# Patient Record
Sex: Female | Born: 1975 | Hispanic: No | Marital: Single | Smoking: Former smoker
Health system: Southern US, Community
[De-identification: ages and names within clinical notes are randomized; demographics above are authoritative.]

## PROBLEM LIST (undated history)

## (undated) DIAGNOSIS — Z8521 Personal history of malignant neoplasm of larynx: Secondary | ICD-10-CM

## (undated) DIAGNOSIS — D649 Anemia, unspecified: Secondary | ICD-10-CM

## (undated) DIAGNOSIS — R519 Headache, unspecified: Secondary | ICD-10-CM

## (undated) DIAGNOSIS — I10 Essential (primary) hypertension: Secondary | ICD-10-CM

## (undated) DIAGNOSIS — R0781 Pleurodynia: Secondary | ICD-10-CM

## (undated) DIAGNOSIS — C140 Malignant neoplasm of pharynx, unspecified: Secondary | ICD-10-CM

## (undated) DIAGNOSIS — E039 Hypothyroidism, unspecified: Secondary | ICD-10-CM

## (undated) DIAGNOSIS — J189 Pneumonia, unspecified organism: Secondary | ICD-10-CM

## (undated) DIAGNOSIS — R918 Other nonspecific abnormal finding of lung field: Secondary | ICD-10-CM

## (undated) DIAGNOSIS — R51 Headache: Secondary | ICD-10-CM

## (undated) DIAGNOSIS — Z9189 Other specified personal risk factors, not elsewhere classified: Secondary | ICD-10-CM

## (undated) HISTORY — DX: Other nonspecific abnormal finding of lung field: R91.8

## (undated) HISTORY — DX: Hypothyroidism, unspecified: E03.9

## (undated) HISTORY — DX: Pleurodynia: R07.81

## (undated) HISTORY — PX: TUBAL LIGATION: SHX77

## (undated) HISTORY — PX: TRACHEOSTOMY: SUR1362

## (undated) HISTORY — DX: Other specified personal risk factors, not elsewhere classified: Z91.89

## (undated) HISTORY — PX: ABDOMINAL SURGERY: SHX537

## (undated) HISTORY — DX: Anemia, unspecified: D64.9

## (undated) HISTORY — DX: Pneumonia, unspecified organism: J18.9

---

## 2012-08-08 ENCOUNTER — Emergency Department (HOSPITAL_COMMUNITY)
Admission: EM | Admit: 2012-08-08 | Discharge: 2012-08-08 | Disposition: A | Discharge status: Home / Self Care | Payer: Medicaid Other | Attending: Emergency Medicine | Admitting: Emergency Medicine

## 2012-08-08 ENCOUNTER — Encounter (HOSPITAL_COMMUNITY): Payer: Self-pay | Admitting: *Deleted

## 2012-08-08 ENCOUNTER — Emergency Department (HOSPITAL_COMMUNITY): Payer: Medicaid Other

## 2012-08-08 DIAGNOSIS — Z87891 Personal history of nicotine dependence: Secondary | ICD-10-CM | POA: Insufficient documentation

## 2012-08-08 DIAGNOSIS — I10 Essential (primary) hypertension: Secondary | ICD-10-CM | POA: Insufficient documentation

## 2012-08-08 DIAGNOSIS — Z85819 Personal history of malignant neoplasm of unspecified site of lip, oral cavity, and pharynx: Secondary | ICD-10-CM | POA: Insufficient documentation

## 2012-08-08 DIAGNOSIS — Z79899 Other long term (current) drug therapy: Secondary | ICD-10-CM | POA: Insufficient documentation

## 2012-08-08 DIAGNOSIS — T85598A Other mechanical complication of other gastrointestinal prosthetic devices, implants and grafts, initial encounter: Secondary | ICD-10-CM

## 2012-08-08 DIAGNOSIS — K9423 Gastrostomy malfunction: Secondary | ICD-10-CM | POA: Insufficient documentation

## 2012-08-08 HISTORY — DX: Essential (primary) hypertension: I10

## 2012-08-08 HISTORY — DX: Malignant neoplasm of pharynx, unspecified: C14.0

## 2012-08-08 MED ORDER — HYDROCODONE-ACETAMINOPHEN 5-325 MG PO TABS
2.0000 | ORAL_TABLET | Freq: Once | ORAL | Status: AC
  Administered 2012-08-08: 2 via ORAL
  Filled 2012-08-08: qty 2

## 2012-08-08 MED ORDER — IOHEXOL 300 MG/ML  SOLN
50.0000 mL | Freq: Once | INTRAMUSCULAR | Status: AC | PRN
  Administered 2012-08-08: 50 mL via ORAL

## 2012-08-08 MED ORDER — LORAZEPAM 1 MG PO TABS
1.0000 mg | ORAL_TABLET | Freq: Once | ORAL | Status: AC
  Administered 2012-08-08: 1 mg via ORAL
  Filled 2012-08-08: qty 1

## 2012-08-08 NOTE — ED Notes (Addendum)
Pt had feeding tube inserted 6 months ago d/t throat cancer, states she has not been using feeding tube, has been tolerating PO foods. MD at [facility], not able to make apt with MD at this time. Feeding tube came out last night - has been in for about 6 months. Does not know if part of tube is inside her

## 2012-08-08 NOTE — ED Notes (Signed)
Pt had a feeding tube but had not been using it much and this am she changed the dressing and the tube came out.

## 2012-08-08 NOTE — ED Provider Notes (Signed)
History     CSN: [id number]  Arrival date & time 08/08/12  1045   First MD Initiated Contact with Patient 08/08/12 1053      Chief Complaint  Patient presents with  . GI Problem    (Consider location/radiation/quality/duration/timing/severity/associated sxs/prior treatment) HPI Comments: This is a 37 year old female, past medical history remarkable for throat cancer, who presents to the ED complaining that her g-tube has come out.  She states that it came out yesterday.  It was originally placed because she had a tracheostomy 2/2 throat cancer.  She states that she has not been using the g-tube for the past several weeks, and that she has been tolerating PO.  She denies fever, chills, nausea, and vomiting.  She is not in any pain.  The history is provided by the spouse and the patient. No language interpreter was used.    Past Medical History  Diagnosis Date  . Throat cancer   . Hypertension     No past surgical history on file.  No family history on file.  History  Substance Use Topics  . Smoking status: Former Games developer  . Smokeless tobacco: Not on file  . Alcohol Use: Yes    OB History   Grav Para Term Preterm Abortions TAB SAB Ect Mult Living                  Review of Systems  All other systems reviewed and are negative.    Allergies  Review of patient's allergies indicates no known allergies.  Home Medications   Current Outpatient Rx  Name  Route  Sig  Dispense  Refill  . aspirin-acetaminophen-caffeine (EXCEDRIN MIGRAINE) 250-250-65 MG per tablet   Oral   Take 1 tablet by mouth every 6 (six) hours as needed for pain.         Marland Kitchen lisinopril (PRINIVIL,ZESTRIL) 10 MG tablet   Oral   Take 10 mg by mouth daily.         Marland Kitchen loratadine (CLARITIN) 10 MG tablet   Oral   Take 10 mg by mouth daily.         . potassium chloride SA (K-DUR,KLOR-CON) 20 MEQ tablet   Oral   Take 20 mEq by mouth 2 (two) times daily.         . traZODone (DESYREL) 50 MG  tablet   Oral   Take 50 mg by mouth at bedtime.           BP 112/76  Pulse 91  Temp(Src) 97.7 F (36.5 C) (Oral)  Resp 18  SpO2 98%  Physical Exam  Nursing note and vitals reviewed. Constitutional: She is oriented to person, place, and time. She appears well-developed and well-nourished.  tracheostomy  HENT:  Head: Normocephalic and atraumatic.  Eyes: Conjunctivae and EOM are normal. Pupils are equal, round, and reactive to light.  Neck: Normal range of motion. Neck supple.  Cardiovascular: Normal rate and regular rhythm.  Exam reveals no gallop and no friction rub.   No murmur heard. Pulmonary/Chest: Effort normal and breath sounds normal. No respiratory distress. She has no wheezes. She has no rales. She exhibits no tenderness.  Abdominal: Soft. Bowel sounds are normal. She exhibits no distension and no mass. There is no tenderness. There is no rebound and no guarding.  2 g-tube tracts, one open, one is healing. No focal tenderness  Musculoskeletal: Normal range of motion. She exhibits no edema and no tenderness.  Neurological: She is alert and oriented  to person, place, and time.  Skin: Skin is warm and dry.  Psychiatric: She has a normal mood and affect. Her behavior is normal. Judgment and thought content normal.    ED Course  FEEDING TUBE REPLACEMENT Date/Time: 08/08/2012 1:46 PM Performed by: Roxy Horseman Authorized by: Roxy Horseman Consent: Verbal consent obtained. written consent not obtained. Risks and benefits: risks, benefits and alternatives were discussed Consent given by: patient Patient understanding: patient states understanding of the procedure being performed Patient consent: the patient's understanding of the procedure matches consent given Procedure consent: procedure consent matches procedure scheduled Relevant documents: relevant documents present and verified Test results: test results available and properly labeled Imaging studies:  imaging studies available Required items: required blood products, implants, devices, and special equipment available Patient identity confirmed: verbally with patient Preparation: Patient was prepped and draped in the usual sterile fashion. Indications: tube removed by patient Local anesthesia used: no Patient sedated: no Tube type: Foley Catheter. Patient position: recumbent Procedure type: replacement Tube size: 8 Fr Endoscope used: no Bulb inflation volume (ml): no balloon. Placement/position confirmation: x-ray and contrast Tube placement difficulty: minimal Patient tolerance: Patient tolerated the procedure well with no immediate complications.   (including critical care time)  No results found for this or any previous visit. Dg Abd 1 View  08/08/2012  *RADIOLOGY REPORT*  Clinical Data: Confirm placement of the feeding tube.  ABDOMEN - 1 VIEW  Comparison: None.  Findings: There is contrast within the stomach.  There is high density material or contrast overlying the right upper abdomen which this likely outside of the patient. Nonspecific bowel gas pattern.  Round densities at the lung bases are suggestive for granulomas. There is also a large calcification near the central aspect of the liver.  This could be within the gallbladder.  IMPRESSION: The contrast is predominately in the stomach as described.  Calcified granulomas at the lung bases.  Possible gallstone.   Original Report Authenticated By: Richarda Overlie, M.D.       1. Feeding tube dysfunction, initial encounter       MDM  37 year old female with g-tube problem.  Discussed with Dr. Clarene Duke.  Will insert another tube, and have patient follow up with her surgeon.  Replacement 2 placed with Foley catheter. [facility] did not have appropriate supplies, therefore fully catheter was used. Discussed this with Dr. Clarene Duke, and as the patient is not actively using the feeding tube, felt this was appropriate. Patient will followup  with her doctor on Monday.       Roxy Horseman, PA-C 08/08/12 1349

## 2012-08-11 NOTE — ED Provider Notes (Signed)
Medical screening examination/treatment/procedure(s) were performed by non-physician practitioner and as supervising physician I was immediately available for consultation/collaboration.   Laray Anger, DO 08/11/12 1009

## 2012-10-20 ENCOUNTER — Emergency Department (HOSPITAL_COMMUNITY)
Admission: EM | Admit: 2012-10-20 | Discharge: 2012-10-21 | Disposition: A | Discharge status: Home / Self Care | Payer: Medicaid Other | Attending: Emergency Medicine | Admitting: Emergency Medicine

## 2012-10-20 ENCOUNTER — Encounter (HOSPITAL_COMMUNITY): Payer: Self-pay | Admitting: Emergency Medicine

## 2012-10-20 DIAGNOSIS — A084 Viral intestinal infection, unspecified: Secondary | ICD-10-CM

## 2012-10-20 DIAGNOSIS — I10 Essential (primary) hypertension: Secondary | ICD-10-CM | POA: Insufficient documentation

## 2012-10-20 DIAGNOSIS — R1084 Generalized abdominal pain: Secondary | ICD-10-CM | POA: Insufficient documentation

## 2012-10-20 DIAGNOSIS — Z85819 Personal history of malignant neoplasm of unspecified site of lip, oral cavity, and pharynx: Secondary | ICD-10-CM | POA: Insufficient documentation

## 2012-10-20 DIAGNOSIS — Z9889 Other specified postprocedural states: Secondary | ICD-10-CM | POA: Insufficient documentation

## 2012-10-20 DIAGNOSIS — M549 Dorsalgia, unspecified: Secondary | ICD-10-CM | POA: Insufficient documentation

## 2012-10-20 DIAGNOSIS — Z3202 Encounter for pregnancy test, result negative: Secondary | ICD-10-CM | POA: Insufficient documentation

## 2012-10-20 DIAGNOSIS — A088 Other specified intestinal infections: Secondary | ICD-10-CM | POA: Insufficient documentation

## 2012-10-20 DIAGNOSIS — R109 Unspecified abdominal pain: Secondary | ICD-10-CM

## 2012-10-20 DIAGNOSIS — Z79899 Other long term (current) drug therapy: Secondary | ICD-10-CM | POA: Insufficient documentation

## 2012-10-20 DIAGNOSIS — Z87891 Personal history of nicotine dependence: Secondary | ICD-10-CM | POA: Insufficient documentation

## 2012-10-20 LAB — URINALYSIS, ROUTINE W REFLEX MICROSCOPIC
Hgb urine dipstick: NEGATIVE
Protein, ur: NEGATIVE mg/dL
Urobilinogen, UA: 0.2 mg/dL (ref 0.0–1.0)

## 2012-10-20 LAB — POCT I-STAT, CHEM 8
BUN: <3 mg/dL — ABNORMAL LOW (ref 6–23)
Calcium, Ion: 1.31 mmol/L — ABNORMAL HIGH (ref 1.12–1.23)
TCO2: 28 mmol/L (ref 0–100)

## 2012-10-20 LAB — CBC WITH DIFFERENTIAL/PLATELET
Basophils Absolute: 0 10*3/uL (ref 0.0–0.1)
Basophils Relative: 1 % (ref 0–1)
Eosinophils Absolute: 0.1 10*3/uL (ref 0.0–0.7)
MCH: 33.1 pg (ref 26.0–34.0)
MCHC: 33.5 g/dL (ref 30.0–36.0)
Monocytes Relative: 7 % (ref 3–12)
Neutrophils Relative %: 83 % — ABNORMAL HIGH (ref 43–77)
Platelets: 358 10*3/uL (ref 150–400)
RDW: 14.6 % (ref 11.5–15.5)

## 2012-10-20 LAB — POCT PREGNANCY, URINE: Preg Test, Ur: NEGATIVE

## 2012-10-20 LAB — COMPREHENSIVE METABOLIC PANEL
BUN: 5 mg/dL — ABNORMAL LOW (ref 6–23)
Calcium: 9.8 mg/dL (ref 8.4–10.5)
GFR calc Af Amer: >90 mL/min (ref >90)
Glucose, Bld: 86 mg/dL (ref 70–99)
Sodium: 137 mEq/L (ref 135–145)
Total Protein: 6.7 g/dL (ref 6.0–8.3)

## 2012-10-20 LAB — LIPASE, BLOOD: Lipase: 33 U/L (ref 11–59)

## 2012-10-20 MED ORDER — ONDANSETRON HCL 4 MG/2ML IJ SOLN
4.0000 mg | Freq: Once | INTRAMUSCULAR | Status: AC
  Administered 2012-10-20: 4 mg via INTRAVENOUS
  Filled 2012-10-20: qty 2

## 2012-10-20 MED ORDER — IOHEXOL 300 MG/ML  SOLN
50.0000 mL | Freq: Once | INTRAMUSCULAR | Status: AC | PRN
  Administered 2012-10-20: 25 mL via ORAL

## 2012-10-20 MED ORDER — SODIUM CHLORIDE 0.9 % IV BOLUS (SEPSIS)
1000.0000 mL | Freq: Once | INTRAVENOUS | Status: AC
  Administered 2012-10-20: 1000 mL via INTRAVENOUS

## 2012-10-20 MED ORDER — HYDROMORPHONE HCL PF 1 MG/ML IJ SOLN
1.0000 mg | Freq: Once | INTRAMUSCULAR | Status: AC
  Administered 2012-10-20: 1 mg via INTRAVENOUS
  Filled 2012-10-20: qty 1

## 2012-10-20 NOTE — ED Notes (Signed)
Patient with abdominal pain, started two days ago, started in her back and moved to her abdomen.  Patient denies any nausea or vomiting at this time.  No urinary symptoms per patient.

## 2012-10-20 NOTE — ED Notes (Signed)
More pain and nausea med given.  She still has half cup to drink

## 2012-10-20 NOTE — ED Notes (Signed)
The pt has become nauseated  Attempting to drink oral contrast.  zofran given

## 2012-10-20 NOTE — ED Notes (Signed)
The pt has a trach she can speak by blocking it off .  No distress

## 2012-10-20 NOTE — ED Notes (Signed)
The pt is c/o more pain and she is trying to drink the 2nd cup of oral contrast

## 2012-10-20 NOTE — ED Provider Notes (Signed)
History     CSN: [id number]  Arrival date & time 10/20/12  1906   First MD Initiated Contact with Patient 10/20/12 2004      Chief Complaint  Patient presents with  . Abdominal Pain    (Consider location/radiation/quality/duration/timing/severity/associated sxs/prior treatment) HPI Comments: 37 year old female with a past medical history of hypertension and throat cancer presents to the emergency department with her sister complaining of generalized abdominal pain x2 days. Pain initially began in her back and radiated around to her abdomen. She cannot pinpoint a specific area of her abdomen hurts more. Describes the pain as cramping, rated 7/10. She has not tried any alleviating factors. Denies associated nausea, vomiting, urinary changes or bowel changes. No fever or chills. No history of abdominal surgeries other than having her tubes tied and G-tube placement after she was treated for throat cancer, however this has not been present since February.  The history is provided by the patient and a relative.    Past Medical History  Diagnosis Date  . Throat cancer   . Hypertension     Past Surgical History  Procedure Laterality Date  . Abdominal surgery    . Tracheostomy      for throat inflammation from radiation    History reviewed. No pertinent family history.  History  Substance Use Topics  . Smoking status: Former Games developer  . Smokeless tobacco: Not on file  . Alcohol Use: Yes    OB History   Grav Para Term Preterm Abortions TAB SAB Ect Mult Living                  Review of Systems  Constitutional: Positive for appetite change. Negative for fever and chills.  HENT: Negative for neck pain and neck stiffness.   Respiratory: Negative for shortness of breath.   Cardiovascular: Negative for chest pain.  Gastrointestinal: Positive for abdominal pain. Negative for nausea, vomiting, diarrhea and constipation.  Genitourinary: Negative for urgency, hematuria, difficulty  urinating, pelvic pain and dyspareunia.  Musculoskeletal: Positive for back pain.  Neurological: Negative for dizziness, light-headedness and headaches.  Psychiatric/Behavioral: Negative for confusion.  All other systems reviewed and are negative.    Allergies  Review of patient's allergies indicates no known allergies.  Home Medications   Current Outpatient Rx  Name  Route  Sig  Dispense  Refill  . aspirin-acetaminophen-caffeine (EXCEDRIN MIGRAINE) 250-250-65 MG per tablet   Oral   Take 1 tablet by mouth every 6 (six) hours as needed for pain.         Marland Kitchen lisinopril (PRINIVIL,ZESTRIL) 10 MG tablet   Oral   Take 10 mg by mouth daily.         Marland Kitchen loratadine (CLARITIN) 10 MG tablet   Oral   Take 10 mg by mouth daily.         . potassium chloride SA (K-DUR,KLOR-CON) 20 MEQ tablet   Oral   Take 20 mEq by mouth 2 (two) times daily.         . traZODone (DESYREL) 50 MG tablet   Oral   Take 50 mg by mouth at bedtime.           BP 147/102  Pulse 77  Temp(Src) 98.2 F (36.8 C) (Oral)  Resp 16  SpO2 98%  LMP 10/20/2012  Physical Exam  Nursing note and vitals reviewed. Constitutional: She is oriented to person, place, and time. She appears well-developed and well-nourished. No distress.  HENT:  Head: Normocephalic and atraumatic.  Mouth/Throat:  Oropharynx is clear and moist.  Eyes: Conjunctivae and EOM are normal. Pupils are equal, round, and reactive to light. No scleral icterus.  Neck: Normal range of motion. Neck supple.  ET tube in place.  Cardiovascular: Normal rate, regular rhythm, normal heart sounds and intact distal pulses.   Pulmonary/Chest: Effort normal and breath sounds normal. No respiratory distress. She has no wheezes. She has no rales.  Abdominal: Soft. Normal appearance and bowel sounds are normal. She exhibits no distension and no mass. There is tenderness (generalized, worse RUQ, RLQ and mid-epigastric). There is guarding. There is no rigidity, no  rebound and no CVA tenderness.  Musculoskeletal: Normal range of motion. She exhibits no edema.  Neurological: She is alert and oriented to person, place, and time.  Skin: Skin is warm and dry. She is not diaphoretic.  Psychiatric: She has a normal mood and affect. Her behavior is normal.    ED Course  Procedures (including critical care time)  Labs Reviewed  CBC WITH DIFFERENTIAL - Abnormal; Notable for the following:    Neutrophils Relative % 83 (*)    Lymphocytes Relative 8 (*)    All other components within normal limits  COMPREHENSIVE METABOLIC PANEL - Abnormal; Notable for the following:    BUN 5 (*)    Total Bilirubin 0.2 (*)    All other components within normal limits  POCT I-STAT, CHEM 8 - Abnormal; Notable for the following:    BUN <3 (*)    Calcium, Ion 1.31 (*)    Hemoglobin 15.3 (*)    All other components within normal limits  URINALYSIS, ROUTINE W REFLEX MICROSCOPIC  LIPASE, BLOOD  POCT PREGNANCY, URINE   Ct Abdomen Pelvis W Contrast  10/21/2012   *RADIOLOGY REPORT*  Clinical Data:   Abdominal pain.  CT ABDOMEN AND PELVIS WITH CONTRAST  Technique:  Multidetector CT imaging of the abdomen and pelvis was performed using the standard protocol following bolus administration of intravenous contrast.  Contrast: OMNIPAQUE IOHEXOL 300 MG/ML  SOLN  Comparison:   None.  Findings:  The liver, gallbladder, spleen, pancreas, adrenal glands and kidneys are within normal limits.  Bowel shows no evidence of obstruction or inflammation.  No abnormal fluid collections are identified.  The appendix is normal.  No masses or enlarged lymph nodes are seen.  No hernias are identified.  The bladder is unremarkable.  Uterus and adnexal regions are unremarkable.  There is evidence of prior bilateral tubal ligation.  No bony abnormalities are identified.  IMPRESSION: No acute findings in the abdomen or pelvis.   Original Report Authenticated By: Irish Lack, M.D.     1. Viral  gastroenteritis   2. Abdominal pain       MDM  37 y/o female with abdominal pain, mid-epigastric, right sided. Labs unremarkable. She appears in NAD. Afebrile, no leukocytosis. No nausea initially, however became nauseated in ED. Patient care continued through [facility] downtime. Pain controlled with dilaudid, nausea with zofran. Gave IV fluids. She was feeling better with fluids. CT scan was obtained to r/o any intra-abdominal abnormality as she did have a history of g-tube placement during cancer treatment. CT scan unremarkable. Dx viral gastroenteritis. Conservative measures discussed. vicodin for pain, zofran for nausea. She has f/u with PCP. Patient stated her understanding of plan and was agreeable, return precautions given.      Trevor Mace, PA-C 10/21/12 [phone]

## 2012-10-20 NOTE — ED Notes (Signed)
The pt is c/o abd cramps for 2-3 days no n or v

## 2012-10-20 NOTE — ED Notes (Signed)
Iv started  And iv meds given.  Her pain is better

## 2012-10-20 NOTE — ED Notes (Signed)
Iv started med given for pain

## 2012-10-20 NOTE — ED Notes (Signed)
The pts pain is much better.  Waiting for c-t of the abd

## 2012-10-21 ENCOUNTER — Emergency Department (HOSPITAL_COMMUNITY): Payer: Medicaid Other

## 2012-10-21 ENCOUNTER — Encounter (HOSPITAL_COMMUNITY): Payer: Self-pay | Admitting: Radiology

## 2012-10-21 MED ORDER — IOHEXOL 300 MG/ML  SOLN
100.0000 mL | Freq: Once | INTRAMUSCULAR | Status: AC | PRN
  Administered 2012-10-21: 100 mL via INTRAVENOUS

## 2012-10-21 MED ORDER — HYDROMORPHONE HCL PF 1 MG/ML IJ SOLN
INTRAMUSCULAR | Status: AC
  Filled 2012-10-21: qty 1

## 2012-10-21 NOTE — ED Provider Notes (Signed)
Medical screening examination/treatment/procedure(s) were performed by non-physician practitioner and as supervising physician I was immediately available for consultation/collaboration.   Joaovictor Krone, MD 10/21/12 2321 

## 2012-12-25 ENCOUNTER — Telehealth: Payer: Self-pay | Admitting: Genetic Counselor

## 2012-12-25 NOTE — Telephone Encounter (Signed)
S/W PT IN RE TO NP APPT 08/28 @ 1:30 W/DR. VICTOR W[employer] PACKET MAILED.

## 2013-01-06 ENCOUNTER — Telehealth: Payer: Self-pay | Admitting: *Deleted

## 2013-01-06 NOTE — Telephone Encounter (Signed)
Called pt at home to introduce myself as the nurse navigator that works with Dr. Alecia Lemming.  I indicated that I would be joining her during her appt with Dr. Alecia Lemming tomorrow and would explain more about my role as a member of her Care Team when we meet.  She expressed appreciation for the call.  Young Berry, RN, BSN, [facility] Navigator [phone]

## 2013-01-07 ENCOUNTER — Encounter: Payer: Self-pay | Admitting: Hematology and Oncology

## 2013-01-07 ENCOUNTER — Ambulatory Visit: Payer: Medicaid Other

## 2013-01-07 ENCOUNTER — Ambulatory Visit (HOSPITAL_BASED_OUTPATIENT_CLINIC_OR_DEPARTMENT_OTHER): Payer: Medicaid Other | Admitting: Hematology and Oncology

## 2013-01-07 VITALS — BP 187/101 | HR 86 | Temp 98.1°F | Resp 19 | Ht 61.0 in | Wt 97.5 lb

## 2013-01-07 DIAGNOSIS — Z8512 Personal history of malignant neoplasm of trachea: Secondary | ICD-10-CM

## 2013-01-07 DIAGNOSIS — C140 Malignant neoplasm of pharynx, unspecified: Secondary | ICD-10-CM

## 2013-01-07 NOTE — Progress Notes (Signed)
Checked in new patient with no financial issues. Didn't ask if living will/poa.... Mail and ph for communication.,

## 2013-01-12 ENCOUNTER — Telehealth: Payer: Self-pay | Admitting: Hematology and Oncology

## 2013-01-12 NOTE — Telephone Encounter (Signed)
S/w pt re appts for 9/4 and 12/23. Pt aware I will call w/ENT appt.

## 2013-01-14 ENCOUNTER — Other Ambulatory Visit: Payer: Medicaid Other

## 2013-01-15 ENCOUNTER — Other Ambulatory Visit (HOSPITAL_BASED_OUTPATIENT_CLINIC_OR_DEPARTMENT_OTHER): Payer: Medicaid Other | Admitting: Lab

## 2013-01-15 ENCOUNTER — Telehealth: Payer: Self-pay | Admitting: Hematology and Oncology

## 2013-01-15 DIAGNOSIS — C140 Malignant neoplasm of pharynx, unspecified: Secondary | ICD-10-CM

## 2013-01-15 LAB — TSH CHCC: TSH: 1.45 m(IU)/L (ref 0.308–3.960)

## 2013-01-15 NOTE — Telephone Encounter (Signed)
S/w the pt and she is aware of her dec 2014 appts

## 2013-01-22 NOTE — Progress Notes (Signed)
ID: Crystal Spence OB: 07-26-1975  MR#: [id number]  WUJ#:[id number]  [facility]  [phone] [phone]    INITIAL HEMATOLOGY CONSULTATION    Referral MD:   Pcp Not In System   Reason for Referral: History of Larynx cancer   HISTORY OF PRESENT ILLNESS: The patient is a 37 y.o. Female who presented for her history of larynx cancer diagnosed in May 2012 which was treated with combine chemoradiation. Chemotherapy was given weekly. After treatment she developed infection and tracheostoma was done (06/2011). She presented today first time. She reported felling tired and dizzy. Sometimes she has abdominal pain, nausea, vomiting. The patient denied fever, chills, night sweats, change in appetite or weight. She denied  double vision, blurry vision, nasal congestion, nasal discharge, hearing problems, odynophagia or dysphagia. No chest pain, palpitations, dyspnea, cough, diarrhea, constipation, hematochezia. The patient denied dysuria, nocturia, polyuria, hematuria, myalgia, numbness, tingling, psychiatric problems.  Review of Systems  Constitutional: Positive for malaise/fatigue. Negative for fever, chills, weight loss and diaphoresis.  HENT: Negative for hearing loss, nosebleeds, congestion, sore throat, neck pain and tinnitus.   Eyes: Negative for blurred vision, double vision, photophobia and pain.  Respiratory: Negative for cough, hemoptysis, sputum production, shortness of breath and wheezing.   Cardiovascular: Negative for chest pain, palpitations, orthopnea, claudication, leg swelling and PND.  Gastrointestinal: Positive for nausea, vomiting and abdominal pain. Negative for heartburn, diarrhea, constipation, blood in stool and melena.  Genitourinary: Negative for dysuria, urgency, frequency, hematuria and flank pain.  Musculoskeletal: Negative for myalgias, back pain, joint pain and falls.  Skin: Negative for itching and rash.  Neurological: Positive for  dizziness. Negative for tingling, tremors, sensory change, speech change, focal weakness, seizures, loss of consciousness, weakness and headaches.  Endo/Heme/Allergies: Bruises/bleeds easily.  Psychiatric/Behavioral: Negative.     PAST MEDICAL HISTORY: Past Medical History  Diagnosis Date  . Throat cancer   . Hypertension     PAST SURGICAL HISTORY: Past Surgical History  Procedure Laterality Date  . Abdominal surgery    . Tracheostomy      for throat inflammation from radiation    FAMILY HISTORY No family history on file.  HEALTH MAINTENANCE: History  Substance Use Topics  . Smoking status: Former Games developer  . Smokeless tobacco: Not on file  . Alcohol Use: Yes   No Known Allergies  Current Outpatient Prescriptions  Medication Sig Dispense Refill  . aspirin-acetaminophen-caffeine (EXCEDRIN MIGRAINE) 250-250-65 MG per tablet Take 1 tablet by mouth every 6 (six) hours as needed for pain.      Marland Kitchen lisinopril (PRINIVIL,ZESTRIL) 10 MG tablet Take 10 mg by mouth daily.      Marland Kitchen loratadine (CLARITIN) 10 MG tablet Take 10 mg by mouth daily.      . potassium chloride SA (K-DUR,KLOR-CON) 20 MEQ tablet Take 20 mEq by mouth 2 (two) times daily.      . traZODone (DESYREL) 50 MG tablet Take 50 mg by mouth at bedtime.       No current facility-administered medications for this visit.    OBJECTIVE: Filed Vitals:   01/07/13 1316  BP: 187/101  Pulse: 86  Temp: 98.1 F (36.7 C)  Resp: 19     Body mass index is 18.43 kg/(m^2).    ECOG FS:0  PHYSICAL EXAMINATION:  HEENT: Sclerae anicteric.  Conjunctivae were pink. Pupils round and reactive bilaterally. Oral mucosa is moist without ulceration or thrush. No occipital, submandibular, cervical, supraclavicular or axillar adenopathy. Tracheostoma O.K. Lungs: clear to auscultation without wheezes. No  rales or rhonchi. Heart: regular rate and rhythm. No murmur, gallop or rubs. Abdomen: soft, non tender. No guarding or rebound tenderness. Bowel  sounds are present. No palpable hepatosplenomegaly.J-tube in place. MSK: no focal spinal tenderness. Extremities: No clubbing or cyanosis.No calf tenderness to palpitation, no peripheral edema. The patient had grossly intact strength in upper and lower extremities. Skin exam was without ecchymosis, petechiae. Neuro: non-focal, alert and oriented to time, person and place, appropriate affect  LAB RESULTS:  CMP     Component Value Date/Time   NA 137 10/20/2012 2049   K 3.6 10/20/2012 2049   CL 102 10/20/2012 2049   CO2 24 10/20/2012 2049   GLUCOSE 86 10/20/2012 2049   BUN 5* 10/20/2012 2049   CREATININE 0.69 10/20/2012 2049   CALCIUM 9.8 10/20/2012 2049   PROT 6.7 10/20/2012 2049   ALBUMIN 3.6 10/20/2012 2049   AST 24 10/20/2012 2049   ALT 11 10/20/2012 2049   ALKPHOS 114 10/20/2012 2049   BILITOT 0.2* 10/20/2012 2049   GFRNONAA >90 10/20/2012 2049   GFRAA >90 10/20/2012 2049    Lab Results  Component Value Date   WBC 7.7 10/20/2012   NEUTROABS 6.4 10/20/2012   HGB 15.3* 10/20/2012   HCT 45.0 10/20/2012   MCV 98.9 10/20/2012   PLT 358 10/20/2012      Chemistry      Component Value Date/Time   NA 137 10/20/2012 2049   K 3.6 10/20/2012 2049   CL 102 10/20/2012 2049   CO2 24 10/20/2012 2049   BUN 5* 10/20/2012 2049   CREATININE 0.69 10/20/2012 2049      Component Value Date/Time   CALCIUM 9.8 10/20/2012 2049   ALKPHOS 114 10/20/2012 2049   AST 24 10/20/2012 2049   ALT 11 10/20/2012 2049   BILITOT 0.2* 10/20/2012 2049      Urinalysis    Component Value Date/Time   COLORURINE YELLOW 10/20/2012 1919   APPEARANCEUR CLEAR 10/20/2012 1919   LABSPEC 1.015 10/20/2012 1919   PHURINE 5.5 10/20/2012 1919   GLUCOSEU NEGATIVE 10/20/2012 1919   HGBUR NEGATIVE 10/20/2012 1919   BILIRUBINUR NEGATIVE 10/20/2012 1919   KETONESUR NEGATIVE 10/20/2012 1919   PROTEINUR NEGATIVE 10/20/2012 1919   UROBILINOGEN 0.2 10/20/2012 1919   NITRITE NEGATIVE 10/20/2012 1919   LEUKOCYTESUR NEGATIVE 10/20/2012 1919     STUDIES: No results found.  ASSESSMENT AND PLAN: 1.  Stage III cancer of larynx, s/p combined chemoradiation. In remission. No evidence of disease now. Last follow up with ENT 6 months ago. I recommend continue follow up with ENT. 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 I recommend to see ENT 2. Follow up in 4 months.    Myra Rude, MD   01/22/2013 5:01 AM

## 2013-01-31 ENCOUNTER — Encounter (HOSPITAL_COMMUNITY): Payer: Self-pay | Admitting: Emergency Medicine

## 2013-01-31 ENCOUNTER — Emergency Department (HOSPITAL_COMMUNITY)
Admission: EM | Admit: 2013-01-31 | Discharge: 2013-01-31 | Disposition: A | Discharge status: Home / Self Care | Payer: Medicaid Other | Attending: Emergency Medicine | Admitting: Emergency Medicine

## 2013-01-31 DIAGNOSIS — Z79899 Other long term (current) drug therapy: Secondary | ICD-10-CM | POA: Insufficient documentation

## 2013-01-31 DIAGNOSIS — R42 Dizziness and giddiness: Secondary | ICD-10-CM | POA: Insufficient documentation

## 2013-01-31 DIAGNOSIS — I10 Essential (primary) hypertension: Secondary | ICD-10-CM | POA: Insufficient documentation

## 2013-01-31 DIAGNOSIS — Z85819 Personal history of malignant neoplasm of unspecified site of lip, oral cavity, and pharynx: Secondary | ICD-10-CM | POA: Insufficient documentation

## 2013-01-31 DIAGNOSIS — G43909 Migraine, unspecified, not intractable, without status migrainosus: Secondary | ICD-10-CM | POA: Insufficient documentation

## 2013-01-31 DIAGNOSIS — Z87891 Personal history of nicotine dependence: Secondary | ICD-10-CM | POA: Insufficient documentation

## 2013-01-31 MED ORDER — SODIUM CHLORIDE 0.9 % IV SOLN
Freq: Once | INTRAVENOUS | Status: AC
  Administered 2013-01-31: 18:00:00 via INTRAVENOUS

## 2013-01-31 MED ORDER — DIPHENHYDRAMINE HCL 50 MG/ML IJ SOLN
12.5000 mg | Freq: Once | INTRAMUSCULAR | Status: AC
  Administered 2013-01-31: 12.5 mg via INTRAVENOUS
  Filled 2013-01-31: qty 1

## 2013-01-31 MED ORDER — KETOROLAC TROMETHAMINE 30 MG/ML IJ SOLN
30.0000 mg | Freq: Once | INTRAMUSCULAR | Status: AC
  Administered 2013-01-31: 30 mg via INTRAVENOUS
  Filled 2013-01-31: qty 1

## 2013-01-31 MED ORDER — HYDROMORPHONE HCL PF 1 MG/ML IJ SOLN
0.5000 mg | Freq: Once | INTRAMUSCULAR | Status: AC
  Administered 2013-01-31: 0.5 mg via INTRAVENOUS
  Filled 2013-01-31: qty 1

## 2013-01-31 MED ORDER — METOCLOPRAMIDE HCL 5 MG/ML IJ SOLN
10.0000 mg | Freq: Once | INTRAMUSCULAR | Status: AC
  Administered 2013-01-31: 10 mg via INTRAVENOUS
  Filled 2013-01-31: qty 2

## 2013-01-31 NOTE — ED Notes (Signed)
Discharge instructions reviewed with pt and family. Pt verbalized understanding.  

## 2013-01-31 NOTE — ED Notes (Signed)
Pt c/o dizziness, HA, nausea, and vomiting x 3-4. Pt also reports she has a sore throat, body aches and fatigued. Pt has had only 1 emesis in the last 24hrs. Pt rates pain in head and throat an 8/10. Pt denies any fevers.

## 2013-01-31 NOTE — ED Notes (Signed)
Pt c/o HA with dizziness that is similar to migraine; pt sts some generalized weakness and not feeling well; pt with hx of trach

## 2013-01-31 NOTE — ED Notes (Addendum)
Pt states she has hx of migraines. Pt states she just began taking topiramate 50mg  tab last week for migraines. Pt states she has had one today. Pt also states she has been coughing more than usual and is coughing up yellow/greenish colored sputum from trach.

## 2013-01-31 NOTE — ED Provider Notes (Signed)
CSN: [id number]     Arrival date & time 01/31/13  1407 History   First MD Initiated Contact with Patient 01/31/13 1636     Chief Complaint  Patient presents with  . Headache  . Dizziness   (Consider location/radiation/quality/duration/timing/severity/associated sxs/prior Treatment) Patient is a 37 y.o. female presenting with headaches. The history is provided by the patient. No language interpreter was used.  Headache Pain location:  Frontal Radiates to:  Does not radiate Associated symptoms: dizziness and photophobia   Associated symptoms: no abdominal pain, no fever and no vomiting   Associated symptoms comment:  History of migraine headaches, recently started on Topamax. She reports typical migraine symptoms of frontal headache pain with photophobia and fatigue. No fever. No sinus congestion or pressure.   Past Medical History  Diagnosis Date  . Throat cancer   . Hypertension    Past Surgical History  Procedure Laterality Date  . Abdominal surgery    . Tracheostomy      for throat inflammation from radiation   History reviewed. No pertinent family history. History  Substance Use Topics  . Smoking status: Former Games developer  . Smokeless tobacco: Not on file  . Alcohol Use: Yes   OB History   Grav Para Term Preterm Abortions TAB SAB Ect Mult Living                 Review of Systems  Constitutional: Negative for fever and chills.  Eyes: Positive for photophobia.  Respiratory: Negative.   Cardiovascular: Negative.   Gastrointestinal: Negative for vomiting and abdominal pain.  Musculoskeletal: Negative.   Skin: Negative.   Neurological: Positive for dizziness and headaches.    Allergies  Review of patient's allergies indicates no known allergies.  Home Medications   Current Outpatient Rx  Name  Route  Sig  Dispense  Refill  . aspirin-acetaminophen-caffeine (EXCEDRIN MIGRAINE) 250-250-65 MG per tablet   Oral   Take 1 tablet by mouth every 6 (six) hours as needed  for pain.         Marland Kitchen losartan (COZAAR) 25 MG tablet   Oral   Take 25 mg by mouth daily.         Marland Kitchen topiramate (TOPAMAX) 50 MG tablet   Oral   Take 50 mg by mouth 2 (two) times daily.         . traZODone (DESYREL) 50 MG tablet   Oral   Take 50 mg by mouth at bedtime.          BP 116/81  Pulse 104  Temp(Src) 97.7 F (36.5 C) (Oral)  Resp 16  SpO2 98% Physical Exam  Constitutional: She is oriented to person, place, and time. She appears well-developed and well-nourished.  HENT:  Head: Normocephalic.  Tracheostomy in place, site unremarkable.   Neck: Normal range of motion. Neck supple.  Cardiovascular: Normal rate and regular rhythm.   Pulmonary/Chest: Effort normal and breath sounds normal.  Abdominal: Soft. Bowel sounds are normal. There is no tenderness. There is no rebound and no guarding.  Musculoskeletal: Normal range of motion. She exhibits no edema.  Neurological: She is alert and oriented to person, place, and time. She has normal reflexes. She exhibits normal muscle tone. Coordination normal.  Skin: Skin is warm and dry. No rash noted.  Psychiatric: She has a normal mood and affect.    ED Course  Procedures (including critical care time) Labs Review Labs Reviewed  CBC WITH DIFFERENTIAL  COMPREHENSIVE METABOLIC PANEL  Imaging Review No results  found.  MDM  No diagnosis found. 1. Migraine headache  Headache is improved but not resolved. Pain medications ordered. Patient is stable for subsequent discharge home with sister who is driving.    Arnoldo Hooker, PA-C 01/31/13 1949

## 2013-02-02 ENCOUNTER — Encounter (HOSPITAL_COMMUNITY): Payer: Self-pay | Admitting: Emergency Medicine

## 2013-02-02 ENCOUNTER — Emergency Department (HOSPITAL_COMMUNITY): Payer: Medicaid Other

## 2013-02-02 ENCOUNTER — Inpatient Hospital Stay (HOSPITAL_COMMUNITY)
Admission: EM | Admit: 2013-02-02 | Discharge: 2013-02-05 | DRG: 377 | Disposition: A | Discharge status: Home / Self Care | Payer: Medicaid Other | Attending: Internal Medicine | Admitting: Internal Medicine

## 2013-02-02 DIAGNOSIS — D62 Acute posthemorrhagic anemia: Secondary | ICD-10-CM

## 2013-02-02 DIAGNOSIS — K2980 Duodenitis without bleeding: Secondary | ICD-10-CM | POA: Diagnosis present

## 2013-02-02 DIAGNOSIS — C329 Malignant neoplasm of larynx, unspecified: Secondary | ICD-10-CM | POA: Diagnosis present

## 2013-02-02 DIAGNOSIS — T3995XA Adverse effect of unspecified nonopioid analgesic, antipyretic and antirheumatic, initial encounter: Secondary | ICD-10-CM | POA: Diagnosis present

## 2013-02-02 DIAGNOSIS — G43909 Migraine, unspecified, not intractable, without status migrainosus: Secondary | ICD-10-CM | POA: Diagnosis present

## 2013-02-02 DIAGNOSIS — R571 Hypovolemic shock: Secondary | ICD-10-CM

## 2013-02-02 DIAGNOSIS — Z923 Personal history of irradiation: Secondary | ICD-10-CM

## 2013-02-02 DIAGNOSIS — F1020 Alcohol dependence, uncomplicated: Secondary | ICD-10-CM

## 2013-02-02 DIAGNOSIS — F1010 Alcohol abuse, uncomplicated: Secondary | ICD-10-CM | POA: Diagnosis present

## 2013-02-02 DIAGNOSIS — K2970 Gastritis, unspecified, without bleeding: Secondary | ICD-10-CM | POA: Diagnosis present

## 2013-02-02 DIAGNOSIS — Z8521 Personal history of malignant neoplasm of larynx: Secondary | ICD-10-CM

## 2013-02-02 DIAGNOSIS — K921 Melena: Principal | ICD-10-CM | POA: Diagnosis present

## 2013-02-02 DIAGNOSIS — K922 Gastrointestinal hemorrhage, unspecified: Secondary | ICD-10-CM

## 2013-02-02 DIAGNOSIS — R7309 Other abnormal glucose: Secondary | ICD-10-CM | POA: Diagnosis present

## 2013-02-02 DIAGNOSIS — Z9221 Personal history of antineoplastic chemotherapy: Secondary | ICD-10-CM

## 2013-02-02 DIAGNOSIS — K269 Duodenal ulcer, unspecified as acute or chronic, without hemorrhage or perforation: Secondary | ICD-10-CM | POA: Diagnosis present

## 2013-02-02 DIAGNOSIS — R55 Syncope and collapse: Secondary | ICD-10-CM

## 2013-02-02 DIAGNOSIS — E871 Hypo-osmolality and hyponatremia: Secondary | ICD-10-CM | POA: Diagnosis present

## 2013-02-02 DIAGNOSIS — R578 Other shock: Secondary | ICD-10-CM | POA: Diagnosis present

## 2013-02-02 DIAGNOSIS — E876 Hypokalemia: Secondary | ICD-10-CM

## 2013-02-02 DIAGNOSIS — I10 Essential (primary) hypertension: Secondary | ICD-10-CM | POA: Diagnosis present

## 2013-02-02 DIAGNOSIS — D473 Essential (hemorrhagic) thrombocythemia: Secondary | ICD-10-CM | POA: Diagnosis present

## 2013-02-02 DIAGNOSIS — Z930 Tracheostomy status: Secondary | ICD-10-CM

## 2013-02-02 HISTORY — DX: Personal history of malignant neoplasm of larynx: Z85.21

## 2013-02-02 LAB — URINALYSIS, ROUTINE W REFLEX MICROSCOPIC
Bilirubin Urine: NEGATIVE
Hgb urine dipstick: NEGATIVE
Ketones, ur: NEGATIVE mg/dL
Leukocytes, UA: NEGATIVE
Nitrite: NEGATIVE
Protein, ur: NEGATIVE mg/dL
Urobilinogen, UA: 1 mg/dL (ref 0.0–1.0)

## 2013-02-02 LAB — BASIC METABOLIC PANEL
BUN: 23 mg/dL (ref 6–23)
CO2: 20 mEq/L (ref 19–32)
Chloride: 97 mEq/L (ref 96–112)
GFR calc Af Amer: >90 mL/min (ref >90)
GFR calc non Af Amer: >90 mL/min (ref >90)
Potassium: 2.6 mEq/L — CL (ref 3.5–5.1)
Sodium: 131 mEq/L — ABNORMAL LOW (ref 135–145)

## 2013-02-02 LAB — CBC
HCT: 11.5 % — ABNORMAL LOW (ref 36.0–46.0)
Hemoglobin: 3.7 g/dL — CL (ref 12.0–15.0)
MCHC: 32.2 g/dL (ref 30.0–36.0)
RDW: 15.4 % (ref 11.5–15.5)
WBC: 11.1 10*3/uL — ABNORMAL HIGH (ref 4.0–10.5)

## 2013-02-02 LAB — PROTIME-INR
INR: 1.16 (ref 0.00–1.49)
Prothrombin Time: 14.6 seconds (ref 11.6–15.2)

## 2013-02-02 LAB — PREPARE RBC (CROSSMATCH)

## 2013-02-02 LAB — ABO/RH: ABO/RH(D): O POS

## 2013-02-02 LAB — GLUCOSE, CAPILLARY: Glucose-Capillary: 118 mg/dL — ABNORMAL HIGH (ref 70–99)

## 2013-02-02 LAB — MRSA PCR SCREENING: MRSA by PCR: NEGATIVE

## 2013-02-02 MED ORDER — LORAZEPAM 2 MG/ML IJ SOLN
0.0000 mg | Freq: Four times a day (QID) | INTRAMUSCULAR | Status: AC
  Administered 2013-02-03 – 2013-02-04 (×4): 1 mg via INTRAVENOUS
  Administered 2013-02-04 (×2): 2 mg via INTRAVENOUS
  Filled 2013-02-02 (×4): qty 1

## 2013-02-02 MED ORDER — PANTOPRAZOLE SODIUM 40 MG IV SOLR: 40.0000 mg | Freq: Once | INTRAVENOUS | Status: DC

## 2013-02-02 MED ORDER — MORPHINE SULFATE 2 MG/ML IJ SOLN
1.0000 mg | INTRAMUSCULAR | Status: DC | PRN
  Administered 2013-02-02 – 2013-02-05 (×10): 1 mg via INTRAVENOUS
  Filled 2013-02-02 (×9): qty 1

## 2013-02-02 MED ORDER — TRAZODONE HCL 50 MG PO TABS
50.0000 mg | ORAL_TABLET | Freq: Every day | ORAL | Status: DC
  Administered 2013-02-02 – 2013-02-04 (×3): 50 mg via ORAL
  Filled 2013-02-02 (×6): qty 1

## 2013-02-02 MED ORDER — PANTOPRAZOLE SODIUM 40 MG IV SOLR: 40.0000 mg | Freq: Two times a day (BID) | INTRAVENOUS | Status: DC

## 2013-02-02 MED ORDER — MORPHINE SULFATE 2 MG/ML IJ SOLN
1.0000 mg | Freq: Once | INTRAMUSCULAR | Status: AC
  Administered 2013-02-02: 1 mg via INTRAVENOUS
  Filled 2013-02-02: qty 1

## 2013-02-02 MED ORDER — SODIUM CHLORIDE 0.9 % IV BOLUS (SEPSIS)
1000.0000 mL | Freq: Once | INTRAVENOUS | Status: AC
  Administered 2013-02-02: 1000 mL via INTRAVENOUS

## 2013-02-02 MED ORDER — SODIUM CHLORIDE 0.9 % IV SOLN
8.0000 mg/h | INTRAVENOUS | Status: DC
  Administered 2013-02-02 – 2013-02-04 (×5): 8 mg/h via INTRAVENOUS
  Filled 2013-02-02 (×10): qty 80

## 2013-02-02 MED ORDER — SODIUM CHLORIDE 0.9 % IV SOLN
80.0000 mg | Freq: Once | INTRAVENOUS | Status: AC
  Administered 2013-02-02: 80 mg via INTRAVENOUS
  Filled 2013-02-02: qty 80

## 2013-02-02 MED ORDER — LORAZEPAM 2 MG/ML IJ SOLN: 0.0000 mg | Freq: Two times a day (BID) | INTRAMUSCULAR | Status: DC

## 2013-02-02 MED ORDER — SODIUM CHLORIDE 0.9 % IJ SOLN
3.0000 mL | Freq: Two times a day (BID) | INTRAMUSCULAR | Status: DC
  Administered 2013-02-02: 10 mL via INTRAVENOUS
  Administered 2013-02-02 – 2013-02-05 (×5): 3 mL via INTRAVENOUS

## 2013-02-02 MED ORDER — POTASSIUM CHLORIDE 10 MEQ/100ML IV SOLN
10.0000 meq | Freq: Once | INTRAVENOUS | Status: AC
  Administered 2013-02-02: 10 meq via INTRAVENOUS
  Filled 2013-02-02: qty 100

## 2013-02-02 NOTE — ED Notes (Signed)
Per hospitalist blood increase to 200.

## 2013-02-02 NOTE — ED Notes (Signed)
Pam RN ICU aware of PENDING  BLOOD TRANSFUSION 2 OF 4 UNITS (2ND UNIT READY),  POTASSIUM INFUSION 1 OF 3, PT IS A TRACH PT, FULL CODE

## 2013-02-02 NOTE — ED Notes (Signed)
Bed: RESA Expected date:  Expected time:  Means of arrival:  Comments: ems cancer pt

## 2013-02-02 NOTE — Progress Notes (Signed)
Ur completed     

## 2013-02-02 NOTE — ED Notes (Signed)
MD at bedside. 

## 2013-02-02 NOTE — ED Notes (Addendum)
Blood product rate change to 100

## 2013-02-02 NOTE — H&P (Addendum)
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   Relatives report that she has not been able to really stand or get around for the last 3 days at home.  She has been falling but has not had weakness on any one side of the body or seizures   She was seen in the ED 9/21/14with headache and went home.  She re-presented today after EMS found her with Sys BP 80/40.  She allegedly has had melanotic stools about 1 x day for the past 3 days, which were frankly dark in nature and assoc with epigastric tenderness-she does take Excdrin MIgrain [contains ASa] for chronic mgraines.  She denies any vomit or n/v or cp currently.  SHe doesn;t have CP.  SHe states feelign dizzy and almost fell a couple of times over the past 1-2 days  Her niece admits to me that patient takes about 6 beers a day and smokes occasionally when I am outside of the room  She was found to have a Hb of 3.7 and also to be hypotesnive.  She is talking to me and oriented and able to relate some of her history without difficulty but cannot give me specficidetails  Review of Systems:  See above  Past Medical History  Diagnosis Date  . Throat cancer   . Hypertension    Past Surgical History  Procedure Laterality Date  . Abdominal surgery    . Tracheostomy      for throat inflammation from radiation   Social History:  reports that she has quit smoking. She does not have any smokeless tobacco  history on file. She reports that  drinks alcohol. She reports that she does not use illicit drugs. Patient lives with her relatives together  No Known Allergies  No family history on file. history of heart issues in family as well as history of cancer prostate in her father  Prior to Admission medications   Medication Sig Start Date End Date Taking? Authorizing Provider  aspirin-acetaminophen-caffeine (EXCEDRIN MIGRAINE) [phone] MG per tablet Take 1 tablet by mouth every 6 (six) hours as needed for pain.    Historical Provider, MD  losartan (COZAAR) 25 MG tablet Take 25 mg by mouth daily.    Historical Provider, MD  topiramate (TOPAMAX) 50 MG tablet Take 50 mg by mouth 2 (two) times daily.    Historical Provider, MD  traZODone (DESYREL) 50 MG tablet Take 50 mg by mouth at bedtime.    Historical Provider, MD   Physical Exam: Filed Vitals:   02/02/13 1130  BP: 109/63  Pulse: 72  Temp:   Resp: 18     General:  Alert pleasant oriented-seems tired  Eyes: Pale  ENT: trach present no secretions. Able to verbalize  Neck: trach  Cardiovascular: S1-S2 tachycardic  Respiratory: clinically clear no added sound  Abdomen: soft slightly tender epigastrium  Skin: days tattoos on either arm  Musculoskeletal: range of motion intact  Psychiatric: euthymic  Neurologic: grossly intact moving all 4 limbs equally. Chest weak overall  Labs on Admission:  Basic Metabolic Panel:  Recent Labs Lab 02/02/13 0942  NA 131*  K 2.6*  CL 97  CO2 20  GLUCOSE 162*  BUN 23  CREATININE 0.72  CALCIUM 8.1*   Liver Function Tests: No results found for this basename: AST, ALT, ALKPHOS, BILITOT, PROT, ALBUMIN,  in the last 168 hours No results found for this basename: LIPASE, AMYLASE,  in the last 168 hours No results found for this basename: AMMONIA,  in the last 168 hours CBC:  Recent Labs Lab 02/02/13 0942  WBC 11.1*  HGB 3.7*  HCT 11.5*  MCV 95.8  PLT 426*   Cardiac  Enzymes: No results found for this basename: CKTOTAL, CKMB, CKMBINDEX, TROPONINI,  in the last 168 hours  BNP (last 3 results) No results found for this basename: PROBNP,  in the last 8760 hours CBG:  Recent Labs Lab 02/02/13 1031  GLUCAP 118*    Radiological Exams on Admission: Dg Chest Port 1 View  02/02/2013   CLINICAL DATA:  Shortness of breath. Cancer patient.  EXAM: PORTABLE CHEST - 1 VIEW  COMPARISON:  None.  FINDINGS: Patient has a tracheostomy, tip overlying the level of the trachea 4.0 cm above carinal. Heart size is normal. Lungs are clear. There are multiple calcified granulomata throughout the lungs bilaterally. No edema. Note is made of S shaped scoliosis of the thoracic spine.  IMPRESSION: Tracheostomy tube as described.  No evidence for acute  abnormality.   Electronically Signed   By: Rosalie Gums M.D.   On: 02/02/2013 11:11    EKG: Independently reviewed. Sinus tachycardia with rate related ST elevations, no inversions, axis normal limits  Assessment/Plan Principal Problem:   Hypovolemic shock Active Problems:   Hypokalemia   Ethanolism   Upper GI bleed   1. Hypovolemic shock-likely secondary to profound GI bleeding-Dr. Elnoria Howard of gastroenterology has been already consulted on this patient and will be seeing him-second unit of blood apparently now in progress, bolus IV fluids.  Repeat CBC in a.m. After a total of 4 units of 2. Likely upper GI bleeding secondary to chronic ethanol use as well as Excedrin use-start pantoprazole infusion with bolus. Monitor closely. 3. Laryngeal cancer-will alert her new oncologist that she is in the hospital by note-not an active problem at this stage 4. 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  MVI when able to tolerate 5. Hypokalemia-replaced in emergency room-repeat labs in a.m. 6. Hyponatremia-likely secondary to #1 7. Reactive thrombocytosis-secondary to #1 8. Impaired  glucose tolerance-likely reactionary   Dr. Elnoria Howard of gastroenterology aware of the patient  Code Status: full CODE STATUS confirmed at bedside with family  Family Communication: long discussion with multiple family members inclusive of mother sister and niece and daughter  Disposition Plan: stepped-down, inpatient  Time spent: 30 minutes  Mahala Menghini [facility] Pager [phone]  If 7PM-7AM, please contact night-coverage [email or web address] Password [facility] 02/02/2013, 11:39 AM

## 2013-02-02 NOTE — ED Notes (Signed)
Pt c/o of hypotension 84/50. New trach pt hx throat cancer. Weakness x3days. ST on monitor. Fall today, denies LOC. CBG 132.

## 2013-02-02 NOTE — Consult Note (Signed)
Reason for Consult: GI bleed Referring Physician: [facility]  Lowella Petties HPI: This is a 37 year old female with a PMH of HTN, migraine headaches, and SCC of the throat who is admitted after a syncopal episode.  She does not know what happened when she passed out and her family members cannot provide any further information.  The patient does report having issues with melena for the past two days.  No prior history of melena before this time and there is no complaint of abdominal pain.  Further evaluation in the ER revealed that her HGB was at 3.7 g/dL.  Her blood SBP pressure was in the 80's at the time of presentation.  Her current blood pressure is up at 162 mmHg and she is feeling better.  Only one unit of blood has been transfused and she is currently getting her second unit of blood.  The patient has a history of migraine headaches and she reports using 2-4 Excedrins per day.  Past Medical History  Diagnosis Date  . Throat cancer   . Hypertension     Past Surgical History  Procedure Laterality Date  . Abdominal surgery    . Tracheostomy      for throat inflammation from radiation    History reviewed. No pertinent family history.  Social History:  reports that she has quit smoking. She has never used smokeless tobacco. She reports that  drinks alcohol. She reports that she does not use illicit drugs.  Allergies: No Known Allergies  Medications:  Scheduled: . LORazepam  0-4 mg Intravenous Q6H   Followed by  . [START ON 02/04/2013] LORazepam  0-4 mg Intravenous Q12H  . [START ON 02/06/2013] pantoprazole (PROTONIX) IV  40 mg Intravenous Q12H  . sodium chloride  3 mL Intravenous Q12H  . traZODone  50 mg Oral QHS   Continuous: . pantoprozole (PROTONIX) infusion 8 mg/hr (02/02/13 1401)    Results for orders placed during the hospital encounter of 02/02/13 (from the past 24 hour(s))  CBC     Status: Abnormal   Collection Time    02/02/13  9:42 AM      Result Value  Range   WBC 11.1 (*) 4.0 - 10.5 K/uL   RBC 1.20 (*) 3.87 - 5.11 MIL/uL   Hemoglobin 3.7 (*) 12.0 - 15.0 g/dL   HCT 45.4 (*) 09.8 - 11.9 %   MCV 95.8  78.0 - 100.0 fL   MCH 30.8  26.0 - 34.0 pg   MCHC 32.2  30.0 - 36.0 g/dL   RDW 14.7  82.9 - 56.2 %   Platelets 426 (*) 150 - 400 K/uL  BASIC METABOLIC PANEL     Status: Abnormal   Collection Time    02/02/13  9:42 AM      Result Value Range   Sodium 131 (*) 135 - 145 mEq/L   Potassium 2.6 (*) 3.5 - 5.1 mEq/L   Chloride 97  96 - 112 mEq/L   CO2 20  19 - 32 mEq/L   Glucose, Bld 162 (*) 70 - 99 mg/dL   BUN 23  6 - 23 mg/dL   Creatinine, Ser 1.30  0.50 - 1.10 mg/dL   Calcium 8.1 (*) 8.4 - 10.5 mg/dL   GFR calc non Af Amer >90  >90 mL/min   GFR calc Af Amer >90  >90 mL/min  PROTIME-INR     Status: None   Collection Time    02/02/13  9:42 AM  Result Value Range   Prothrombin Time 14.6  11.6 - 15.2 seconds   INR 1.16  0.00 - 1.49  TYPE AND SCREEN     Status: None   Collection Time    02/02/13 10:15 AM      Result Value Range   ABO/RH(D) O POS     Antibody Screen NEG     Sample Expiration 02/05/2013     Unit Number [id number]     Blood Component Type RBC LR PHER2     Unit division 00     Status of Unit ISSUED     Transfusion Status OK TO TRANSFUSE     Crossmatch Result COMPATIBLE     Unit Number [id number]     Blood Component Type RED CELLS,LR     Unit division 00     Status of Unit ALLOCATED     Transfusion Status OK TO TRANSFUSE     Crossmatch Result COMPATIBLE     Unit Number [id number]     Blood Component Type RED CELLS,LR     Unit division 00     Status of Unit ALLOCATED     Transfusion Status OK TO TRANSFUSE     Crossmatch Result Compatible     Unit Number [id number]     Blood Component Type RED CELLS,LR     Unit division 00     Status of Unit ISSUED     Transfusion Status OK TO TRANSFUSE     Crossmatch Result Compatible    ABO/RH     Status: None   Collection Time    02/02/13 10:15 AM       Result Value Range   ABO/RH(D) O POS    OCCULT BLOOD, POC DEVICE     Status: Abnormal   Collection Time    02/02/13 10:19 AM      Result Value Range   Fecal Occult Bld POSITIVE (*) NEGATIVE  GLUCOSE, CAPILLARY     Status: Abnormal   Collection Time    02/02/13 10:31 AM      Result Value Range   Glucose-Capillary 118 (*) 70 - 99 mg/dL  URINALYSIS, ROUTINE W REFLEX MICROSCOPIC     Status: None   Collection Time    02/02/13 10:43 AM      Result Value Range   Color, Urine YELLOW  YELLOW   APPearance CLEAR  CLEAR   Specific Gravity, Urine 1.016  1.005 - 1.030   pH 6.5  5.0 - 8.0   Glucose, UA NEGATIVE  NEGATIVE mg/dL   Hgb urine dipstick NEGATIVE  NEGATIVE   Bilirubin Urine NEGATIVE  NEGATIVE   Ketones, ur NEGATIVE  NEGATIVE mg/dL   Protein, ur NEGATIVE  NEGATIVE mg/dL   Urobilinogen, UA 1.0  0.0 - 1.0 mg/dL   Nitrite NEGATIVE  NEGATIVE   Leukocytes, UA NEGATIVE  NEGATIVE  PREPARE RBC (CROSSMATCH)     Status: None   Collection Time    02/02/13 11:00 AM      Result Value Range   Order Confirmation ORDER PROCESSED BY BLOOD BANK       Dg Chest Port 1 View  02/02/2013   CLINICAL DATA:  Shortness of breath. Cancer patient.  EXAM: PORTABLE CHEST - 1 VIEW  COMPARISON:  None.  FINDINGS: Patient has a tracheostomy, tip overlying the level of the trachea 4.0 cm above carinal. Heart size is normal. Lungs are clear. There are multiple calcified granulomata throughout the lungs bilaterally. No edema. Note is made of S  shaped scoliosis of the thoracic spine.  IMPRESSION: Tracheostomy tube as described.  No evidence for acute  abnormality.   Electronically Signed   By: Rosalie Gums M.D.   On: 02/02/2013 11:11    ROS:  As stated above in the HPI otherwise negative.  Blood pressure 162/96, pulse 104, temperature 97.4 F (36.3 C), temperature source Oral, resp. rate 16, height 5\' 1"  (1.549 m), weight 100 lb 5 oz (45.5 kg), SpO2 100.00%.    PE: Gen: NAD, Alert and Oriented HEENT:  [place]/AT, EOMI,  tracheostomy Neck: Supple, no LAD Lungs: CTA Bilaterally CV: RRR without M/G/R ABM: Soft, NTND, +BS Ext: No C/C/E  Assessment/Plan: 1) Probable upper GI bleed. 2) Severe anemia. 3) Syncope. 4) Tracheostomy.   The patient is clinically improved, but she only has received one completed unit of PRBC.  She is hemodynamically stable.  An EGD is required, but it will be safer if her HGB is higher.  The plan is to transfuse 4 units of PRBC.  Plan: 1) Continue with blood transfusions. 2) Protonix. 3) Emergent EGD if she decompensates otherwise she will undergo the procedure tomorrow afternoon. Angalina Ante D 02/02/2013, 2:49 PM

## 2013-02-02 NOTE — ED Provider Notes (Signed)
CSN: [id number]     Arrival date & time 02/02/13  4098 History   First MD Initiated Contact with Patient 02/02/13 [phone]     Chief Complaint  Patient presents with  . Hypotension  . Fatigue   level V caveat unstable vital signs (Consider location/radiation/quality/duration/timing/severity/associated sxs/prior Treatment) HPI Complains of generalized weakness since yesterday. She also complains of mild headache. Denies abdominal pain denies chest pain. She admits to mild shortness of breath. Other symptoms include diarrhea, yesterday. Denies blood per rectal. No other associated symptoms. No treatment prior to coming here. By EMS. Patient fell earlier today though denies injury Past Medical History  Diagnosis Date  . Throat cancer   . Hypertension    Past Surgical History  Procedure Laterality Date  . Abdominal surgery    . Tracheostomy      for throat inflammation from radiation   No family history on file. History  Substance Use Topics  . Smoking status: Former Games developer  . Smokeless tobacco: Not on file  . Alcohol Use: Yes   OB History   Grav Para Term Preterm Abortions TAB SAB Ect Mult Living                 Review of Systems  Unable to perform ROS: Unstable vital signs  HENT: Positive for congestion.   Respiratory: Positive for shortness of breath.        Tracheostomy  Gastrointestinal: Positive for diarrhea.  Neurological: Positive for weakness.    Allergies  Review of patient's allergies indicates no known allergies.  Home Medications   Current Outpatient Rx  Name  Route  Sig  Dispense  Refill  . aspirin-acetaminophen-caffeine (EXCEDRIN MIGRAINE) 250-250-65 MG per tablet   Oral   Take 1 tablet by mouth every 6 (six) hours as needed for pain.         Marland Kitchen losartan (COZAAR) 25 MG tablet   Oral   Take 25 mg by mouth daily.         Marland Kitchen topiramate (TOPAMAX) 50 MG tablet   Oral   Take 50 mg by mouth 2 (two) times daily.         . traZODone (DESYREL) 50 MG  tablet   Oral   Take 50 mg by mouth at bedtime.          BP 107/67  Pulse 107  Temp(Src) 98 F (36.7 C) (Oral)  Resp 23  SpO2 99% Physical Exam  Nursing note and vitals reviewed. Constitutional: She is oriented to person, place, and time. She appears distressed.  Acutely and Chronically ill-appearing, hypotensive, tachycardic  HENT:  Head: Normocephalic and atraumatic.  Eyes: Pupils are equal, round, and reactive to light.  Conjunctiva pale  Neck: Neck supple. No tracheal deviation present. No thyromegaly present.  Tracheostomy in place. Yellow secretions from tracheostomy  Cardiovascular: Regular rhythm.   No murmur heard. Tachycardic  Pulmonary/Chest: Effort normal and breath sounds normal. No respiratory distress.  Abdominal: Soft. Bowel sounds are normal. She exhibits no distension. There is no tenderness.  Genitourinary: Guaiac positive stool.  Maroon stool, gross melena, Hemoccult positive  Musculoskeletal: Normal range of motion. She exhibits no edema and no tenderness.  Neurological: She is alert and oriented to person, place, and time. Coordination normal.  Skin: Skin is warm and dry. No rash noted.  Psychiatric: She has a normal mood and affect.    ED Course  Procedures (including critical care time) Labs Review Labs Reviewed  CBC  BASIC METABOLIC PANEL  Imaging Review No results found. 10:45 AM Blood pressure improved after treatment with saline bolus 1 L intravenously.  Date: 02/02/2013  Rate: 115  Rhythm: sinus tachycardia  QRS Axis: normal  Intervals: normal  ST/T Wave abnormalities: nonspecific T wave changes  Conduction Disutrbances:none  Narrative Interpretation:   Old EKG Reviewed: none available Results for orders placed during the hospital encounter of 02/02/13  CBC      Result Value Range   WBC 11.1 (*) 4.0 - 10.5 K/uL   RBC 1.20 (*) 3.87 - 5.11 MIL/uL   Hemoglobin 3.7 (*) 12.0 - 15.0 g/dL   HCT 16.1 (*) 09.6 - 04.5 %   MCV 95.8   78.0 - 100.0 fL   MCH 30.8  26.0 - 34.0 pg   MCHC 32.2  30.0 - 36.0 g/dL   RDW 40.9  81.1 - 91.4 %   Platelets 426 (*) 150 - 400 K/uL  BASIC METABOLIC PANEL      Result Value Range   Sodium 131 (*) 135 - 145 mEq/L   Potassium 2.6 (*) 3.5 - 5.1 mEq/L   Chloride 97  96 - 112 mEq/L   CO2 20  19 - 32 mEq/L   Glucose, Bld 162 (*) 70 - 99 mg/dL   BUN 23  6 - 23 mg/dL   Creatinine, Ser 7.82  0.50 - 1.10 mg/dL   Calcium 8.1 (*) 8.4 - 10.5 mg/dL   GFR calc non Af Amer >90  >90 mL/min   GFR calc Af Amer >90  >90 mL/min  PROTIME-INR      Result Value Range   Prothrombin Time 14.6  11.6 - 15.2 seconds   INR 1.16  0.00 - 1.49  URINALYSIS, ROUTINE W REFLEX MICROSCOPIC      Result Value Range   Color, Urine YELLOW  YELLOW   APPearance CLEAR  CLEAR   Specific Gravity, Urine 1.016  1.005 - 1.030   pH 6.5  5.0 - 8.0   Glucose, UA NEGATIVE  NEGATIVE mg/dL   Hgb urine dipstick NEGATIVE  NEGATIVE   Bilirubin Urine NEGATIVE  NEGATIVE   Ketones, ur NEGATIVE  NEGATIVE mg/dL   Protein, ur NEGATIVE  NEGATIVE mg/dL   Urobilinogen, UA 1.0  0.0 - 1.0 mg/dL   Nitrite NEGATIVE  NEGATIVE   Leukocytes, UA NEGATIVE  NEGATIVE  GLUCOSE, CAPILLARY      Result Value Range   Glucose-Capillary 118 (*) 70 - 99 mg/dL  OCCULT BLOOD, POC DEVICE      Result Value Range   Fecal Occult Bld POSITIVE (*) NEGATIVE  PREPARE RBC (CROSSMATCH)      Result Value Range   Order Confirmation ORDER PROCESSED BY BLOOD BANK    TYPE AND SCREEN      Result Value Range   ABO/RH(D) O POS     Antibody Screen NEG     Sample Expiration 02/05/2013     Unit Number [id number]     Blood Component Type RBC LR PHER2     Unit division 00     Status of Unit ISSUED     Transfusion Status OK TO TRANSFUSE     Crossmatch Result COMPATIBLE     Unit Number [id number]     Blood Component Type RED CELLS,LR     Unit division 00     Status of Unit ALLOCATED     Transfusion Status OK TO TRANSFUSE     Crossmatch Result COMPATIBLE      Unit Number [id number]  Blood Component Type RED CELLS,LR     Unit division 00     Status of Unit ALLOCATED     Transfusion Status OK TO TRANSFUSE     Crossmatch Result Compatible     Unit Number [id number]     Blood Component Type RED CELLS,LR     Unit division 00     Status of Unit ALLOCATED     Transfusion Status OK TO TRANSFUSE     Crossmatch Result Compatible    ABO/RH      Result Value Range   ABO/RH(D) O POS     Dg Chest Port 1 View  02/02/2013   CLINICAL DATA:  Shortness of breath. Cancer patient.  EXAM: PORTABLE CHEST - 1 VIEW  COMPARISON:  None.  FINDINGS: Patient has a tracheostomy, tip overlying the level of the trachea 4.0 cm above carinal. Heart size is normal. Lungs are clear. There are multiple calcified granulomata throughout the lungs bilaterally. No edema. Note is made of S shaped scoliosis of the thoracic spine.  IMPRESSION: Tracheostomy tube as described.  No evidence for acute  abnormality.   Electronically Signed   By: Rosalie Gums M.D.   On: 02/02/2013 11:11    12:05 PM patient feels improved after treatment with intravenous fluids and red blood cell transfusion.Iv potassium ordered MDM  No diagnosis found. Foley catheter ordered to monitor urine output. Patient requires emergency blood transfusion for hemoglobin of 3.1 and symptomatic anemia and hypotension Spoke with Dr.Hung, gastroenterology who will evaluate patient. Spoke witf Dr. Gloriann Loan who will evaluate pt for admission Plan admit step down unit Dx #1 Acute Gastrointestinal bleed #2hypokalemia #3hyponatremia #4 hypotension #5 symptomatric anemia CRITICAL CARE Performed by: Doug Sou Total critical care time: 45 minutes Critical care time was exclusive of separately billable procedures and treating other patients. Critical care was necessary to treat or prevent imminent or life-threatening deterioration. Critical care was time spent personally by me on the following activities:  development of treatment plan with patient and/or surrogate as well as nursing, discussions with consultants, evaluation of patient's response to treatment, examination of patient, obtaining history from patient or surrogate, ordering and performing treatments and interventions, ordering and review of laboratory studies, ordering and review of radiographic studies, pulse oximetry and re-evaluation of patient's condition.  Doug Sou, MD 02/02/13 1226

## 2013-02-02 NOTE — Progress Notes (Signed)
   CARE MANAGEMENT ED NOTE 02/02/2013  Patient:  Crystal Spence, Crystal Spence   Account Number:  [id number]  Date Initiated:  02/02/2013  Documentation initiated by:  Edd Arbour  Subjective/Objective Assessment:     Subjective/Objective Assessment Detail:   Melena, Profound hypotension resulting in Hypovolemic shock hgb 3.7 hypotensive     Action/Plan:   Action/Plan Detail:   Pt without pcp listed Pt scanned august 2014 medicaid card indicates pcp is [facility] Dr Suzzette Righter Bonsu EPIC updated    CM noted pt informed [facility] pcp is [name], [facility] @ [facility] EPIC updated with this info also   Anticipated DC Date:  02/05/2013     Status Recommendation to Physician:   Result of Recommendation:    Other ED Services  Consult Working Plan    DC Planning Services  Other  PCP issues    Choice offered to / List presented to:            Status of service:  Completed, signed off  ED Comments:   ED Comments Detail:

## 2013-02-02 NOTE — ED Notes (Signed)
Family at bedside. 

## 2013-02-03 ENCOUNTER — Encounter: Payer: Self-pay | Admitting: *Deleted

## 2013-02-03 ENCOUNTER — Encounter (HOSPITAL_COMMUNITY): Payer: Self-pay | Admitting: Internal Medicine

## 2013-02-03 ENCOUNTER — Encounter (HOSPITAL_COMMUNITY)
Admission: EM | Disposition: A | Discharge status: Home / Self Care | Payer: Self-pay | Source: Home / Self Care | Attending: Internal Medicine

## 2013-02-03 DIAGNOSIS — E876 Hypokalemia: Secondary | ICD-10-CM

## 2013-02-03 DIAGNOSIS — E871 Hypo-osmolality and hyponatremia: Secondary | ICD-10-CM | POA: Diagnosis present

## 2013-02-03 DIAGNOSIS — D62 Acute posthemorrhagic anemia: Secondary | ICD-10-CM

## 2013-02-03 DIAGNOSIS — Z930 Tracheostomy status: Secondary | ICD-10-CM

## 2013-02-03 DIAGNOSIS — Z8521 Personal history of malignant neoplasm of larynx: Secondary | ICD-10-CM

## 2013-02-03 DIAGNOSIS — K922 Gastrointestinal hemorrhage, unspecified: Secondary | ICD-10-CM

## 2013-02-03 DIAGNOSIS — R55 Syncope and collapse: Secondary | ICD-10-CM | POA: Diagnosis present

## 2013-02-03 DIAGNOSIS — R578 Other shock: Secondary | ICD-10-CM

## 2013-02-03 HISTORY — PX: ESOPHAGOGASTRODUODENOSCOPY: SHX5428

## 2013-02-03 HISTORY — DX: Personal history of malignant neoplasm of larynx: Z85.21

## 2013-02-03 LAB — TYPE AND SCREEN
Unit division: 0
Unit division: 0
Unit division: 0

## 2013-02-03 LAB — BASIC METABOLIC PANEL
BUN: 9 mg/dL (ref 6–23)
Calcium: 7.5 mg/dL — ABNORMAL LOW (ref 8.4–10.5)
Chloride: 104 mEq/L (ref 96–112)
GFR calc Af Amer: >90 mL/min (ref >90)
GFR calc non Af Amer: >90 mL/min (ref >90)
Potassium: 3.2 mEq/L — ABNORMAL LOW (ref 3.5–5.1)
Sodium: 131 mEq/L — ABNORMAL LOW (ref 135–145)

## 2013-02-03 LAB — COMPREHENSIVE METABOLIC PANEL
ALT: 5 U/L (ref 0–35)
Alkaline Phosphatase: 49 U/L (ref 39–117)
BUN: 14 mg/dL (ref 6–23)
CO2: 21 mEq/L (ref 19–32)
Chloride: 106 mEq/L (ref 96–112)
Creatinine, Ser: 0.7 mg/dL (ref 0.50–1.10)
GFR calc non Af Amer: >90 mL/min (ref >90)
Potassium: 2.7 mEq/L — CL (ref 3.5–5.1)
Total Bilirubin: 1.1 mg/dL (ref 0.3–1.2)
Total Protein: 4.5 g/dL — ABNORMAL LOW (ref 6.0–8.3)

## 2013-02-03 LAB — CBC
HCT: 32.6 % — ABNORMAL LOW (ref 36.0–46.0)
Hemoglobin: 10.7 g/dL — ABNORMAL LOW (ref 12.0–15.0)
Hemoglobin: 11.4 g/dL — ABNORMAL LOW (ref 12.0–15.0)
MCH: 30.1 pg (ref 26.0–34.0)
MCH: 30.2 pg (ref 26.0–34.0)
MCHC: 34.9 g/dL (ref 30.0–36.0)
MCHC: 35 g/dL (ref 30.0–36.0)
MCV: 86.2 fL (ref 78.0–100.0)
MCV: 86.2 fL (ref 78.0–100.0)
MCV: 86.5 fL (ref 78.0–100.0)
Platelets: 245 10*3/uL (ref 150–400)
Platelets: 287 10*3/uL (ref 150–400)
RBC: 3.56 MIL/uL — ABNORMAL LOW (ref 3.87–5.11)
RBC: 3.56 MIL/uL — ABNORMAL LOW (ref 3.87–5.11)
RDW: 15.7 % — ABNORMAL HIGH (ref 11.5–15.5)
RDW: 16 % — ABNORMAL HIGH (ref 11.5–15.5)
RDW: 16.4 % — ABNORMAL HIGH (ref 11.5–15.5)
WBC: 7.2 10*3/uL (ref 4.0–10.5)
WBC: 7 10*3/uL (ref 4.0–10.5)
WBC: 8.3 10*3/uL (ref 4.0–10.5)

## 2013-02-03 SURGERY — EGD (ESOPHAGOGASTRODUODENOSCOPY)
Anesthesia: Moderate Sedation

## 2013-02-03 MED ORDER — POTASSIUM CHLORIDE CRYS ER 20 MEQ PO TBCR
40.0000 meq | EXTENDED_RELEASE_TABLET | ORAL | Status: AC
  Administered 2013-02-03: 40 meq via ORAL
  Filled 2013-02-03: qty 2

## 2013-02-03 MED ORDER — FENTANYL CITRATE 0.05 MG/ML IJ SOLN
INTRAMUSCULAR | Status: AC
  Filled 2013-02-03: qty 2

## 2013-02-03 MED ORDER — POTASSIUM CHLORIDE 10 MEQ/100ML IV SOLN
10.0000 meq | INTRAVENOUS | Status: AC
  Administered 2013-02-03 (×4): 10 meq via INTRAVENOUS
  Filled 2013-02-03 (×4): qty 100

## 2013-02-03 MED ORDER — POTASSIUM CHLORIDE 10 MEQ/100ML IV SOLN
10.0000 meq | INTRAVENOUS | Status: DC
  Administered 2013-02-03: 10 meq via INTRAVENOUS

## 2013-02-03 MED ORDER — SODIUM CHLORIDE 0.9 % IV SOLN
INTRAVENOUS | Status: DC
  Administered 2013-02-04: 100 mL/h via INTRAVENOUS

## 2013-02-03 MED ORDER — POTASSIUM CHLORIDE CRYS ER 20 MEQ PO TBCR
40.0000 meq | EXTENDED_RELEASE_TABLET | Freq: Once | ORAL | Status: AC
  Administered 2013-02-03: 40 meq via ORAL
  Filled 2013-02-03: qty 2

## 2013-02-03 MED ORDER — LIDOCAINE VISCOUS 2 % MT SOLN
OROMUCOSAL | Status: AC
  Filled 2013-02-03: qty 15

## 2013-02-03 MED ORDER — FENTANYL CITRATE 0.05 MG/ML IJ SOLN
INTRAMUSCULAR | Status: DC | PRN
  Administered 2013-02-03 (×2): 25 ug via INTRAVENOUS

## 2013-02-03 MED ORDER — MIDAZOLAM HCL 10 MG/2ML IJ SOLN
INTRAMUSCULAR | Status: DC | PRN
  Administered 2013-02-03: 1 mg via INTRAVENOUS
  Administered 2013-02-03 (×2): 2 mg via INTRAVENOUS

## 2013-02-03 MED ORDER — DIPHENHYDRAMINE HCL 50 MG/ML IJ SOLN
INTRAMUSCULAR | Status: AC
  Filled 2013-02-03: qty 1

## 2013-02-03 MED ORDER — MIDAZOLAM HCL 10 MG/2ML IJ SOLN
INTRAMUSCULAR | Status: AC
  Filled 2013-02-03: qty 2

## 2013-02-03 MED ORDER — MAGNESIUM SULFATE 40 MG/ML IJ SOLN
2.0000 g | Freq: Once | INTRAMUSCULAR | Status: AC
Start: 2013-02-03 — End: 2013-02-03
  Administered 2013-02-03: 2 g via INTRAVENOUS
  Filled 2013-02-03: qty 50

## 2013-02-03 MED ORDER — SODIUM CHLORIDE 0.9 % IV SOLN: INTRAVENOUS | Status: DC

## 2013-02-03 NOTE — Op Note (Signed)
[facility] [address] [place], [address]   OPERATIVE PROCEDURE REPORT  PATIENT :Crystal, Spence  MR#: [id number] BIRTHDATE :09-Jul-1975 GENDER: Female ENDOSCOPIST: Dr.  Lorenza Burton, MD ASSISTANT:   Felecia Shelling, RN Oletha Blend, technician PROCEDURE DATE: 02-09-2013 PRE-PROCEDURE PREPERATION: Patient fasted for 4 hours prior to procedure. PRE-PROCEDURE PHYSICAL: Patient has stable vital signs.  Neck is supple.  There is no JVD, thyromegaly or LAD.  Chest clear to auscultation; patient has a tracheostomy; S1 and S2 regular. Abdomen soft, non-distended, non-tender with NABS. PROCEDURE:     EGD with cold biopsies x 4. ASA CLASS:     Class IV INDICATIONS:     Melena and acute post hemorrhagic anemia s/p blood transfusions. MEDICATIONS:     Fentanyl 50 mcg IV and Versed 5 mg IV TOPICAL ANESTHETIC:   none given.  DESCRIPTION OF PROCEDURE:   After the risks benefits and alternatives of the procedure were thoroughly explained, informed consent was obtained.  The Pentax Gastroscope [id number]  was introduced through the mouth and advanced to the second portion of the duodenum , without limitations. The instrument was slowly withdrawn as the mucosa was fully examined.   The esophagus and the GEJ appeared normal. Diffuse gastritis was noted and antral biopsies were done to rule out H. pylori by pathology. There was duodenitis noted in the duodenal bulb and a large clean based postbulbar ulcer was noted. No erosions, masses or polyps noted. Retroflexed views revealed no abnormalities. The scope was then withdrawn from the patient and the procedure terminated. The patient tolerated the procedure without immediate complications.  IMPRESSION:  1) Normal appearing esophagus and GEJ. 2) Moderate diffuse gastritis; antral biopsies done for H. pylori. 3) Moderate duodenitis in the duodenal bulb. 4) Large clean based ulcer in the post bulbar region with edema  and friability of the surrounding mucosa. 5) Normal duodenal mucosa distal to the ulcer upto 60 cm.  RECOMMENDATIONS:     1.  Await pathology results. 2.  Stop all NSAIDS including EXCEDRIN for now. 3.  Continue current medications. 4.  Continue PPI's.  REPEAT EXAM:  None planned for now.  DISCHARGE INSTRUCTIONS: standard discharge instructions given. _______________________________ eSigned:  Dr. Lorenza Burton, MD 02/09/2013 4:47 PM   CPT CODES:     [phone], EGD with biopsy  DIAGNOSIS CODES:     280.9 Iron Deficiency Anemia 792.1 Heme Positive Stool   CC: THP  PATIENT NAME:  Crystal, Spence MR#: [id number]

## 2013-02-03 NOTE — Progress Notes (Signed)
Paged MD Elisabeth Pigeon no orders given yet. Pt's K is 2.7, resting, v/s stable will monitor.

## 2013-02-03 NOTE — Progress Notes (Addendum)
[facility] PROGRESS NOTE  Crystal Spence UJW:[id number] DOB: 1976-03-10 DOA: 02/02/2013 PCP: Jackie Plum, MD  Assessment/Plan: #1 hypovolemic shock Secondary to acute GI bleed. Status post 4 units packed red blood cells. Blood pressure improved currently at 132/90. Continue Protonix drip. Patient for EGD today. Continue IV fluids. Follow.  #2 GI bleed Likely secondary to upper GI bleed as patient on Excedrin for migraine headaches. Patient also with a history of alcohol dependence. Patient is status post 4 units packed red blood cells. Hemoglobin today is 10.8 from 3.7 on admission. Continue Protonix drip. Continue IV fluids. Keep n.p.o.  Continue serial CBCs. Transfusion threshold hemoglobin less than 7. Patient for probable upper endoscopy today per GI. GI is following and appreciate input and recommendations.  #3 syncope Likely secondary to problem #1. Follow.  #4 acute blood loss anemia Secondary to problem #2. Status post 4 units packed red blood cells. Hemoglobin 10.8 from 3.7. See problem #2.  #5 hypokalemia Likely secondary to problem #2. Magnesium at 1.8. Replete.  #6 hyponatremia Secondary to hypovolemic hyponatremia. Improved with hydration. Follow.  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 Patient will need to followup with ENT as outpatient. Respiratory therapy for trach care.  #8 history of alcohol dependence On the Ativan withdrawal protocol. Monitor.  #9 prophylaxis Continue Protonix drip for GI prophylaxis. SCDs for DVT prophylaxis.  Code Status: Full Family Communication: Updated patient, mother, sister at bedside. Disposition Plan: Remain in SDU   Consultants:  GI: Dr Elnoria Howard 02/02/13  Procedures:  4 units PRBCs 02/02/13  CXR 02/02/13  Antibiotics:  None  HPI/Subjective: Patient states feeling better. No BM since admission.  Objective: Filed  Vitals:   02/03/13 0351  BP:   Pulse:   Temp: 98.2 F (36.8 C)  Resp:     Intake/Output Summary (Last 24 hours) at 02/03/13 0847 Last data filed at 02/02/13 2330  Gross per 24 hour  Intake 3724.58 ml  Output    660 ml  Net 3064.58 ml   Filed Weights   02/02/13 1400 02/03/13 0118  Weight: 45.5 kg (100 lb 5 oz) 47.2 kg (104 lb 0.9 oz)    Exam:   General:  NAD  Cardiovascular: RRR  Respiratory: CTAB anterior lung fields  Abdomen: Soft/NT/ND/+BS  Musculoskeletal: No c/c/e  Data Reviewed: Basic Metabolic Panel:  Recent Labs Lab 02/02/13 0942 02/03/13 0145  NA 131* 134*  K 2.6* 2.7*  CL 97 106  CO2 20 21  GLUCOSE 162* 89  BUN 23 14  CREATININE 0.72 0.70  CALCIUM 8.1* 7.1*  MG  --  1.8   Liver Function Tests:  Recent Labs Lab 02/03/13 0145  AST 14  ALT 5  ALKPHOS 49  BILITOT 1.1  PROT 4.5*  ALBUMIN 2.4*   No results found for this basename: LIPASE, AMYLASE,  in the last 168 hours No results found for this basename: AMMONIA,  in the last 168 hours CBC:  Recent Labs Lab 02/02/13 0942 02/03/13 0145  WBC 11.1* 7.2  HGB 3.7* 10.8*  HCT 11.5* 30.7*  MCV 95.8 86.2  PLT 426* 245   Cardiac Enzymes: No results found for this basename: CKTOTAL, CKMB, CKMBINDEX, TROPONINI,  in the last 168 hours BNP (last 3 results) No results found for this basename: PROBNP,  in the last 8760 hours CBG:  Recent Labs Lab 02/02/13 1031  GLUCAP 118*    Recent Results (from the past 240 hour(s))  MRSA PCR  SCREENING     Status: None   Collection Time    02/02/13  1:44 PM      Result Value Range Status   MRSA by PCR NEGATIVE  NEGATIVE Final   Comment:            The GeneXpert MRSA Assay (FDA     approved for NASAL specimens     only), is one component of a     comprehensive MRSA colonization     surveillance program. It is not     intended to diagnose MRSA     infection nor to guide or     monitor treatment for     MRSA infections.     Studies: Dg  Chest Port 1 View  02/02/2013   CLINICAL DATA:  Shortness of breath. Cancer patient.  EXAM: PORTABLE CHEST - 1 VIEW  COMPARISON:  None.  FINDINGS: Patient has a tracheostomy, tip overlying the level of the trachea 4.0 cm above carinal. Heart size is normal. Lungs are clear. There are multiple calcified granulomata throughout the lungs bilaterally. No edema. Note is made of S shaped scoliosis of the thoracic spine.  IMPRESSION: Tracheostomy tube as described.  No evidence for acute  abnormality.   Electronically Signed   By: Rosalie Gums M.D.   On: 02/02/2013 11:11    Scheduled Meds: . LORazepam  0-4 mg Intravenous Q6H   Followed by  . [START ON 02/04/2013] LORazepam  0-4 mg Intravenous Q12H  . magnesium sulfate 1 - 4 g bolus IVPB  2 g Intravenous Once  . [START ON 02/06/2013] pantoprazole (PROTONIX) IV  40 mg Intravenous Q12H  . sodium chloride  3 mL Intravenous Q12H  . traZODone  50 mg Oral QHS   Continuous Infusions: . pantoprozole (PROTONIX) infusion 8 mg/hr (02/03/13 0725)    Principal Problem:   Hypovolemic shock Active Problems:   Hypokalemia   Ethanolism   Upper GI bleed   Acute blood loss anemia   Syncope   Hyponatremia   Tracheostomy in place   History of laryngeal cancer    Time spent: > 35 mins    [facility]  [facility] Pager [phone]. If 7PM-7AM, please contact night-coverage at [email or web address], password [facility] 02/03/2013, 8:47 AM  LOS: 1 day

## 2013-02-03 NOTE — Progress Notes (Signed)
CARE MANAGEMENT NOTE 02/03/2013  Patient:  NEMA, OATLEY   Account Number:  [id number]  Date Initiated:  02/03/2013  Documentation initiated by:  D'Arcy Abraha  Subjective/Objective Assessment:   active gi bleed with hgb 5.2 on admit     Action/Plan:   egd and home when stable   Anticipated DC Date:  02/06/2013   Anticipated DC Plan:  HOME/SELF CARE  In-house referral  NA      DC Planning Services  NA      [facility] Choice  NA   Choice offered to / List presented to:  NA   DME arranged  NA      DME agency  NA     HH arranged  NA      HH agency  NA   Status of service:  In process, will continue to follow Medicare Important Message given?  NA - LOS <3 / Initial given by admissions (If response is "NO", the following Medicare IM given date fields will be blank) Date Medicare IM given:   Date Additional Medicare IM given:    Discharge Disposition:    Per UR Regulation:  Reviewed for med. necessity/level of care/duration of stay  If discussed at Long Length of Stay Meetings, dates discussed:    Comments:  Bjorn Loser Zamia Tyminski,RN,BSN,CCM

## 2013-02-03 NOTE — Progress Notes (Signed)
Visited pt in ICU.  Did meet with pt during her 01/06/13 with Dr. Alecia Lemming.  Not currently navigating based on Dr. Haynes Bast assessment per this visit.  Will continue to follow during this admission, however.  Young Berry, RN, BSN, [facility] Navigator [phone]

## 2013-02-04 ENCOUNTER — Encounter (HOSPITAL_COMMUNITY): Payer: Self-pay | Admitting: Gastroenterology

## 2013-02-04 LAB — BASIC METABOLIC PANEL
BUN: 4 mg/dL — ABNORMAL LOW (ref 6–23)
CO2: 19 mEq/L (ref 19–32)
Calcium: 7.7 mg/dL — ABNORMAL LOW (ref 8.4–10.5)
Chloride: 106 mEq/L (ref 96–112)
Glucose, Bld: 91 mg/dL (ref 70–99)
Potassium: 3.2 mEq/L — ABNORMAL LOW (ref 3.5–5.1)
Sodium: 132 mEq/L — ABNORMAL LOW (ref 135–145)

## 2013-02-04 LAB — CBC
HCT: 31.8 % — ABNORMAL LOW (ref 36.0–46.0)
Hemoglobin: 11 g/dL — ABNORMAL LOW (ref 12.0–15.0)
MCHC: 34.6 g/dL (ref 30.0–36.0)
MCV: 87.6 fL (ref 78.0–100.0)
RBC: 3.63 MIL/uL — ABNORMAL LOW (ref 3.87–5.11)
WBC: 7.4 10*3/uL (ref 4.0–10.5)

## 2013-02-04 LAB — MAGNESIUM: Magnesium: 1.8 mg/dL (ref 1.5–2.5)

## 2013-02-04 MED ORDER — POTASSIUM CHLORIDE CRYS ER 20 MEQ PO TBCR
40.0000 meq | EXTENDED_RELEASE_TABLET | ORAL | Status: AC
  Administered 2013-02-04: 40 meq via ORAL
  Filled 2013-02-04: qty 2

## 2013-02-04 MED ORDER — PANTOPRAZOLE SODIUM 40 MG PO TBEC
40.0000 mg | DELAYED_RELEASE_TABLET | Freq: Two times a day (BID) | ORAL | Status: DC
  Administered 2013-02-04 – 2013-02-05 (×3): 40 mg via ORAL
  Filled 2013-02-04 (×4): qty 1

## 2013-02-04 NOTE — Progress Notes (Signed)
Stopped to see patient today. She has a trach. Had prayer and spent some time with her.

## 2013-02-04 NOTE — Progress Notes (Signed)
[facility] PROGRESS NOTE  Falicia Lizotte ZOX:[id number] DOB: 04-Apr-1976 DOA: 02/02/2013 PCP: Jackie Plum, MD  Assessment/Plan: #1 hypovolemic shock Secondary to acute GI bleed. Status post 4 units packed red blood cells. Blood pressure improved currently at 136/97. Continue Protonix. Patient s/p EGD yesterday with gastritis and duodenitis and a clean-based ulcer noted. Normal saline lock IV fluids. Follow.  #2 GI bleed Likely secondary to upper GI bleed as patient on Excedrin for migraine headaches. Patient is status post upper anoscopy 02/03/2013 showing moderate diffuse gastritis, moderate duodenitis, large clean-based ulcer in the postbulbar region with edema and friability of the surrounding mucosa. Patient also with a history of alcohol dependence. Patient is status post 4 units packed red blood cells. Hemoglobin today is 11.0 from 10.8 from 3.7 on admission. Change Protonix to oral BID. Normal saline lock IV fluids.  Check CBC in AM. Transfusion threshold hemoglobin less than 7. No NSAIDs including Excedrin. GI following and appreciate input and recommendations.  #3 syncope Likely secondary to problem #1. Follow.  #4 acute blood loss anemia Secondary to problem #2. Status post 4 units packed red blood cells. Hemoglobin 11.0 from 10.8 from 3.7. See problem #2.  #5 hypokalemia Likely secondary to problem #2. Magnesium at 1.8. Replete.  #6 hyponatremia Secondary to hypovolemic hyponatremia. Improved with hydration. Follow.  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 Patient will need to followup with ENT as outpatient. Respiratory therapy for trach care.  #8 history of alcohol dependence On the Ativan withdrawal protocol. Monitor.  #9 prophylaxis Continue Protonix for GI prophylaxis. SCDs for DVT prophylaxis.  Code Status: Full Family Communication: Updated patient, mother, sister  at bedside. Disposition Plan: Transfer to [facility].   Consultants:  GI: Dr Elnoria Howard 02/02/13  Procedures:  4 units PRBCs 02/02/13  CXR 02/02/13  EGD Dr Loreta Ave 02/03/13  Antibiotics:  None  HPI/Subjective: Patient states feeling better. No BM since admission. No complaints.  Objective: Filed Vitals:   02/04/13 0800  BP: 158/104  Pulse: 99  Temp:   Resp:     Intake/Output Summary (Last 24 hours) at 02/04/13 0929 Last data filed at 02/04/13 0900  Gross per 24 hour  Intake   3135 ml  Output   2500 ml  Net    635 ml   Filed Weights   02/02/13 1400 02/03/13 0118 02/04/13 0400  Weight: 45.5 kg (100 lb 5 oz) 47.2 kg (104 lb 0.9 oz) 48.8 kg (107 lb 9.4 oz)    Exam:   General:  NAD  Cardiovascular: RRR  Respiratory: CTAB anterior lung fields  Abdomen: Soft/NT/ND/+BS  Musculoskeletal: No c/c/e  Data Reviewed: Basic Metabolic Panel:  Recent Labs Lab 02/02/13 0942 02/03/13 0145 02/03/13 1025 02/04/13 0329  NA 131* 134* 131* 132*  K 2.6* 2.7* 3.2* 3.2*  CL 97 106 104 106  CO2 20 21 19 19   GLUCOSE 162* 89 93 91  BUN 23 14 9  4*  CREATININE 0.72 0.70 0.63 0.59  CALCIUM 8.1* 7.1* 7.5* 7.7*  MG  --  1.8  --  1.8   Liver Function Tests:  Recent Labs Lab 02/03/13 0145  AST 14  ALT 5  ALKPHOS 49  BILITOT 1.1  PROT 4.5*  ALBUMIN 2.4*   No results found for this basename: LIPASE, AMYLASE,  in the last 168 hours No results found for this basename: AMMONIA,  in the last 168 hours CBC:  Recent Labs Lab 02/02/13 0942 02/03/13 0145 02/03/13  0820 02/03/13 1322 02/04/13 0329  WBC 11.1* 7.2 7.0 8.3 7.4  HGB 3.7* 10.8* 10.7* 11.4* 11.0*  HCT 11.5* 30.7* 30.7* 32.6* 31.8*  MCV 95.8 86.2 86.2 86.5 87.6  PLT 426* 245 287 277 294   Cardiac Enzymes: No results found for this basename: CKTOTAL, CKMB, CKMBINDEX, TROPONINI,  in the last 168 hours BNP (last 3 results) No results found for this basename: PROBNP,  in the last 8760 hours CBG:  Recent Labs Lab  02/02/13 1031  GLUCAP 118*    Recent Results (from the past 240 hour(s))  MRSA PCR SCREENING     Status: None   Collection Time    02/02/13  1:44 PM      Result Value Range Status   MRSA by PCR NEGATIVE  NEGATIVE Final   Comment:            The GeneXpert MRSA Assay (FDA     approved for NASAL specimens     only), is one component of a     comprehensive MRSA colonization     surveillance program. It is not     intended to diagnose MRSA     infection nor to guide or     monitor treatment for     MRSA infections.     Studies: Dg Chest Port 1 View  02/02/2013   CLINICAL DATA:  Shortness of breath. Cancer patient.  EXAM: PORTABLE CHEST - 1 VIEW  COMPARISON:  None.  FINDINGS: Patient has a tracheostomy, tip overlying the level of the trachea 4.0 cm above carinal. Heart size is normal. Lungs are clear. There are multiple calcified granulomata throughout the lungs bilaterally. No edema. Note is made of S shaped scoliosis of the thoracic spine.  IMPRESSION: Tracheostomy tube as described.  No evidence for acute  abnormality.   Electronically Signed   By: Rosalie Gums M.D.   On: 02/02/2013 11:11    Scheduled Meds: . LORazepam  0-4 mg Intravenous Q6H   Followed by  . LORazepam  0-4 mg Intravenous Q12H  . [START ON 02/06/2013] pantoprazole (PROTONIX) IV  40 mg Intravenous Q12H  . potassium chloride  40 mEq Oral Q4H  . sodium chloride  3 mL Intravenous Q12H  . traZODone  50 mg Oral QHS   Continuous Infusions: . pantoprozole (PROTONIX) infusion 8 mg/hr (02/04/13 0315)    Principal Problem:   Hypovolemic shock Active Problems:   Hypokalemia   Ethanolism   Upper GI bleed   Acute blood loss anemia   Syncope   Hyponatremia   Tracheostomy in place   History of laryngeal cancer    Time spent: > 35 mins    [facility]  [facility] Pager [phone]. If 7PM-7AM, please contact night-coverage at [email or web address], password [facility] 02/04/2013, 9:29 AM  LOS: 2 days

## 2013-02-04 NOTE — Progress Notes (Signed)
Assisted patient to bedside commode, voided large amount, tolerated ambulating well.  Patient is on her menstrual cycle which started today

## 2013-02-04 NOTE — Progress Notes (Signed)
Patient received from stepdown via bed.  Alert and oriented, oriented to room, ordering meals, phone and call light system.  Assisted into recliner, legs elevated, tracheostomy with 28% oxygen, IV x 2 present.  Given pen and paper, tissues, telephone and call light all in reach.

## 2013-02-04 NOTE — Progress Notes (Signed)
Subjective: No acute events.  She feels well.  Objective: Vital signs in last 24 hours: Temp:  [98.2 F (36.8 C)-98.6 F (37 C)] 98.2 F (36.8 C) (09/25 1200) Pulse Rate:  [89-108] 99 (09/25 1000) Resp:  [13-32] 17 (09/25 1000) BP: (120-159)/(80-127) 136/97 mmHg (09/25 1000) SpO2:  [94 %-100 %] 100 % (09/25 1000) FiO2 (%):  [28 %] 28 % (09/25 0800) Weight:  [107 lb 9.4 oz (48.8 kg)] 107 lb 9.4 oz (48.8 kg) (09/25 0400) Last BM Date: 02/03/13  Intake/Output from previous day: 09/24 0701 - 09/25 0700 In: 2860 [I.V.:2710; IV Piggyback:150] Out: 2500 [Urine:2500] Intake/Output this shift: Total I/O In: 450 [I.V.:450] Out: 350 [Urine:350]  General appearance: alert and no distress GI: soft, non-tender; bowel sounds normal; no masses,  no organomegaly  Lab Results:  Recent Labs  02/03/13 0820 02/03/13 1322 02/04/13 0329  WBC 7.0 8.3 7.4  HGB 10.7* 11.4* 11.0*  HCT 30.7* 32.6* 31.8*  PLT 287 277 294   BMET  Recent Labs  02/03/13 0145 02/03/13 1025 02/04/13 0329  NA 134* 131* 132*  K 2.7* 3.2* 3.2*  CL 106 104 106  CO2 21 19 19   GLUCOSE 89 93 91  BUN 14 9 4*  CREATININE 0.70 0.63 0.59  CALCIUM 7.1* 7.5* 7.7*   LFT  Recent Labs  02/03/13 0145  PROT 4.5*  ALBUMIN 2.4*  AST 14  ALT 5  ALKPHOS 49  BILITOT 1.1   PT/INR  Recent Labs  02/02/13 0942  LABPROT 14.6  INR 1.16   Hepatitis Panel No results found for this basename: HEPBSAG, HCVAB, HEPAIGM, HEPBIGM,  in the last 72 hours C-Diff No results found for this basename: CDIFFTOX,  in the last 72 hours Fecal Lactopherrin No results found for this basename: FECLLACTOFRN,  in the last 72 hours  Studies/Results: No results found.  Medications:  Scheduled: . LORazepam  0-4 mg Intravenous Q6H   Followed by  . LORazepam  0-4 mg Intravenous Q12H  . pantoprazole  40 mg Oral BID  . potassium chloride  40 mEq Oral Q4H  . sodium chloride  3 mL Intravenous Q12H  . traZODone  50 mg Oral QHS    Continuous:   Assessment/Plan: 1) Large clean-based duodenal ulcer. 2) Severe anemia secondary to the ulcer.   Her HGB is in the 11 range.  No further evidence of bleeding.  Plan: 1) PPI indefinitely. 2) Avoid NSAIDs. 3) Signing off.   LOS: 2 days   Crystal Spence 02/04/2013, 1:16 PM

## 2013-02-05 DIAGNOSIS — F1020 Alcohol dependence, uncomplicated: Secondary | ICD-10-CM

## 2013-02-05 LAB — CBC
Hemoglobin: 10 g/dL — ABNORMAL LOW (ref 12.0–15.0)
MCH: 30.3 pg (ref 26.0–34.0)
MCV: 89.4 fL (ref 78.0–100.0)
RBC: 3.3 MIL/uL — ABNORMAL LOW (ref 3.87–5.11)

## 2013-02-05 LAB — BASIC METABOLIC PANEL
CO2: 23 mEq/L (ref 19–32)
Calcium: 8.1 mg/dL — ABNORMAL LOW (ref 8.4–10.5)
Chloride: 104 mEq/L (ref 96–112)
Creatinine, Ser: 0.69 mg/dL (ref 0.50–1.10)
GFR calc Af Amer: >90 mL/min (ref >90)
Glucose, Bld: 93 mg/dL (ref 70–99)

## 2013-02-05 MED ORDER — ACETAMINOPHEN 500 MG PO TABS: 500.0000 mg | ORAL_TABLET | ORAL | Status: DC | PRN

## 2013-02-05 MED ORDER — LOSARTAN POTASSIUM 25 MG PO TABS: 25.0000 mg | ORAL_TABLET | Freq: Every day | ORAL | Status: DC

## 2013-02-05 MED ORDER — TOPIRAMATE 25 MG PO TABS: 50.0000 mg | ORAL_TABLET | Freq: Two times a day (BID) | ORAL | Status: DC

## 2013-02-05 MED ORDER — TOPIRAMATE 25 MG PO TABS
50.0000 mg | ORAL_TABLET | Freq: Two times a day (BID) | ORAL | Status: DC
  Administered 2013-02-05: 50 mg via ORAL
  Filled 2013-02-05 (×2): qty 2

## 2013-02-05 MED ORDER — PANTOPRAZOLE SODIUM 40 MG PO TBEC: 40.0000 mg | DELAYED_RELEASE_TABLET | Freq: Two times a day (BID) | ORAL | Status: DC

## 2013-02-05 NOTE — Progress Notes (Signed)
CARE MANAGEMENT NOTE 02/05/2013  Patient:  FLO, BERROA   Account Number:  [id number]  Date Initiated:  02/03/2013  Documentation initiated by:  DAVIS,RHONDA  Subjective/Objective Assessment:   active gi bleed with hgb 5.2 on admit     Action/Plan:   egd and home when stable   Anticipated DC Date:  02/05/2013   Anticipated DC Plan:  HOME/SELF CARE      Status of service:  In process, will continue to follow Medicare Important Message given?  NA - LOS <3 / Initial given by admissions   Per UR Regulation:  Reviewed for med. necessity/level of care/duration of stay  Comments:  02/05/13 Algernon Huxley RN BSN [phone] Met wit pt at bedside to discuss discharge. Pt informed me that she lives with her sister and has transportation to her appointments. She denied any needs at this time.  [id number] Davis,RN,BSN,CCM: Patient stable and being transfered to floor from sdu, iv flds ongoing,iv protonix, replacing mg and kcl.  Rhonda Davis,RN,BSN,CCM

## 2013-02-05 NOTE — ED Provider Notes (Signed)
Medical screening examination/treatment/procedure(s) were performed by non-physician practitioner and as supervising physician I was immediately available for consultation/collaboration.   Junius Argyle, MD 02/05/13 1352

## 2013-02-05 NOTE — Discharge Summary (Signed)
Physician Discharge Summary  Crystal Spence ZOX:[id number] DOB: 1975/11/25 DOA: 02/02/2013  PCP: Jackie Plum, MD  Admit date: 02/02/2013 Discharge date: 02/05/2013  Time spent: 60 minutes  Recommendations for Outpatient Follow-up:  1. Followup with OSEI-BONSU,GEORGE, MD one week post discharge. On followup CBC will need to be obtained to followup on the patient's hemoglobin. A basic metabolic profile to be obtained to followup on patient's electrolytes and renal function. 2. Patient is to followup with Dr. Elnoria Howard 2 weeks post discharge.  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  Relatives report that she has not been able to really stand or get around for the last 3 days at home. She has been falling but has not had weakness on any one side of the body or seizures  She was seen in the ED 9/21/14with headache and went home. She re-presented today after EMS found her with Sys BP 80/40. She allegedly has had melanotic stools about 1 x day for the past 3 days, which were frankly dark in nature and assoc with epigastric tenderness-she does take Excdrin MIgrain [contains ASa] for chronic mgraines. She denies any vomit or n/v or cp currently. SHe doesn;t have CP. SHe states feelign dizzy and almost fell a couple of times over  the past 1-2 days  Her niece admits to me that patient takes about 6 beers a day and smokes occasionally when I am outside of the room  She was found to have a Hb of 3.7 and also to be hypotesnive. She is talking to me and oriented and able to relate some of her history without difficulty but cannot give me specficidetails      Hospital Course:  #1 hypovolemic shock  Patient was admitted with a syncopal episode as well as was noted to be hypotensive on admission. Hemoglobin was noted to be 3.7. This was placed in the step down unit placed on IV fluids and transfused a total of 4 units of packed red blood cells. It was felt patient's hypovolemic shock was secondary to hemorrhagic shock from acute GI bleed. Patient was placed on a Protonix drip and monitored. Was also placed on IV fluids and his blood pressure responded to IV fluids and transfusion. Patient underwent an EGD that showed gastritis and duodenitis as well as a clean-based ulcer. Patient's hemoglobin stabilized. Patient's blood pressure stabilized. Patient was subsequently transferred to the regular floor where he remained in stable and improved condition with resolution of her hypovolemia. Patient's Cozaar was held during the hospitalization and patient has been instructed to resume it 2-3 days post discharge. #2 upper GI bleed  Patient presented with a syncopal episode noted to have a hemoglobin of 3.7 with complaints of melanotic stools. Patient did have a history of alcohol use as well  as Excedrin Migraine. Patient was admitted to the step down unit. It was felt patient did have a upper GI bleed secondary to NSAID use and chronic alcohol use. A GI consultation was obtained and patient was seen in consultation by Dr. Elnoria Howard. Patient was transfused a total of 4 units packed red blood cells with improvement in the hemoglobin which is stabilized between 10 and 11. Patient subsequently underwent a upper endoscopy on 02/03/2013 showing moderate  diffuse gastritis, moderate duodenitis, large clean-based ulcer in the postbulbar region with edema and friability of the surrounding mucosa. Patient was also placed on a Protonix drip and IV fluids. Patient was subsequently transitioned to IV Protonix and then oral Protonix which she tolerated. Patient's hemoglobin remained stable did not have any further melanotic stools and patient be discharged in stable and improved condition. Patient be discharged on Protonix indefinitely. Patient is to followup with PCP one week post discharge and will need a repeat CBC done. Patient is to followup with Dr. Elnoria Howard of gastroenterology 2 weeks post discharge. Patient be discharged in stable and improved condition. #3 syncope  Likely secondary to problem #1.  Patient improved with blood transfusions and IV fluids. Patient had no further syncopal episodes.  #4 acute blood loss anemia  Secondary to problem #2. Status post 4 units packed red blood cells. Hemoglobin 10.0 from 11.0 from 10.8 from 3.7. See problem #2.  #5 hypokalemia  Likely secondary to problem #2. Magnesium at 1.8. Repleted during the hospitalization. On day of discharge patient's potassium was 3.6.  #6 hyponatremia  Secondary to hypovolemic hyponatremia. Improved with hydration. Follow.  #7 history of laryngeal cancer status post tracheostomy  Laryngeal cancer in remission per oncology's recent note from 01/07/2013. Patient will need to followup with ENT as outpatient. Respiratory therapy for trach care.  #8 history of alcohol dependence  Patient was placed on the Ativan withdrawal protocol. Patient did not experience any DTs or symptoms of withdrawal. Patient was advised on alcohol cessation.  The rest of patient's chronic medical issues remained stable throughout the hospitalization.     Procedures: 4 units PRBCs 02/02/13  CXR 02/02/13  EGD Dr Loreta Ave 02/03/13   Consultations: GI: Dr Elnoria Howard 02/02/13   Discharge Exam: Filed Vitals:   02/05/13  1402  BP: 125/84  Pulse: 96  Temp: 98.4 F (36.9 C)  Resp: 18    General: NAD Cardiovascular: RRR Respiratory: CTAB  Discharge Instructions      Discharge Orders   Future Appointments Provider Department Dept Phone   05/04/2013 12:30 PM Delcie Roch [facility] [phone]   05/04/2013 1:00 PM [facility] Covering Provider [address] [place] [facility] [phone]   Future Orders Complete By Expires   Diet general  As directed    Discharge instructions  As directed    Comments:     Follow up with OSEI-BONSU,GEORGE, MD in1 week. Follow up with Dr Elnoria Howard in 2 weeks. NO NSAIDS or excedrin migraine. STOP ALCOHOL.   Increase activity slowly  As directed        Medication List    STOP taking these medications       aspirin-acetaminophen-caffeine 250-250-65 MG per tablet  Commonly known as:  EXCEDRIN MIGRAINE      TAKE these medications       acetaminophen 500 MG tablet  Commonly known as:  TYLENOL  Take 1 tablet (500 mg total) by mouth every 4 (four) hours as needed for pain.  losartan 25 MG tablet  Commonly known as:  COZAAR  Take 1 tablet (25 mg total) by mouth daily. Resume in 2 days.  Start taking on:  02/07/2013     pantoprazole 40 MG tablet  Commonly known as:  PROTONIX  Take 1 tablet (40 mg total) by mouth 2 (two) times daily.     PRESCRIPTION MEDICATION  Inject 1 each into the muscle every other day. Allergy shots prescribed by Dr. Linus Galas at [facility].     topiramate 50 MG tablet  Commonly known as:  TOPAMAX  Take 50 mg by mouth 2 (two) times daily.     traZODone 50 MG tablet  Commonly known as:  DESYREL  Take 50 mg by mouth at bedtime.       No Known Allergies Follow-up Information   Follow up with OSEI-BONSU,GEORGE, MD. Schedule an appointment as soon as possible for a visit in 1 week.   Specialty:  Internal Medicine   Contact information:   [address] [place] [address] [phone]       Follow up with HUNG,PATRICK D, MD. Schedule an appointment as soon as possible for a visit in 2 weeks.   Specialty:  Gastroenterology   Contact information:   [address] [place] [address] [phone]        The results of significant diagnostics from this hospitalization (including imaging, microbiology, ancillary and laboratory) are listed below for reference.    Significant Diagnostic Studies: Dg Chest Port 1 View  02/02/2013   CLINICAL DATA:  Shortness of breath. Cancer patient.  EXAM: PORTABLE CHEST - 1 VIEW  COMPARISON:  None.  FINDINGS: Patient has a tracheostomy, tip overlying the level of the trachea 4.0 cm above carinal. Heart size is normal. Lungs are clear. There are multiple calcified granulomata throughout the lungs bilaterally. No edema. Note is made of S shaped scoliosis of the thoracic spine.  IMPRESSION: Tracheostomy tube as described.  No evidence for acute  abnormality.   Electronically Signed   By: Rosalie Gums M.D.   On: 02/02/2013 11:11    Microbiology: Recent Results (from the past 240 hour(s))  MRSA PCR SCREENING     Status: None   Collection Time    02/02/13  1:44 PM      Result Value Range Status   MRSA by PCR NEGATIVE  NEGATIVE Final   Comment:            The GeneXpert MRSA Assay (FDA     approved for NASAL specimens     only), is one component of a     comprehensive MRSA colonization     surveillance program. It is not     intended to diagnose MRSA     infection nor to guide or     monitor treatment for     MRSA infections.     Labs: Basic Metabolic Panel:  Recent Labs Lab 02/02/13 0942 02/03/13 0145 02/03/13 1025 02/04/13 0329 02/05/13 0455  NA 131* 134* 131* 132* 133*  K 2.6* 2.7* 3.2* 3.2* 3.6  CL 97 106 104 106 104  CO2 20 21 19 19 23   GLUCOSE 162* 89 93 91 93  BUN 23 14 9  4* 5*  CREATININE 0.72 0.70 0.63 0.59 0.69  CALCIUM 8.1* 7.1* 7.5* 7.7* 8.1*  MG  --  1.8   --  1.8  --    Liver Function Tests:  Recent Labs Lab 02/03/13 0145  AST 14  ALT 5  ALKPHOS 49  BILITOT 1.1  PROT 4.5*  ALBUMIN 2.4*   No results found for this basename: LIPASE, AMYLASE,  in the last 168 hours No results found for this basename: AMMONIA,  in the last 168 hours CBC:  Recent Labs Lab 02/03/13 0145 02/03/13 0820 02/03/13 1322 02/04/13 0329 02/05/13 0455  WBC 7.2 7.0 8.3 7.4 5.6  HGB 10.8* 10.7* 11.4* 11.0* 10.0*  HCT 30.7* 30.7* 32.6* 31.8* 29.5*  MCV 86.2 86.2 86.5 87.6 89.4  PLT 245 287 277 294 310   Cardiac Enzymes: No results found for this basename: CKTOTAL, CKMB, CKMBINDEX, TROPONINI,  in the last 168 hours BNP: BNP (last 3 results) No results found for this basename: PROBNP,  in the last 8760 hours CBG:  Recent Labs Lab 02/02/13 1031  GLUCAP 118*       Signed:  Rutherford Alarie  [facility] 02/05/2013, 3:05 PM

## 2013-02-05 NOTE — Progress Notes (Signed)
Went over discharge instructions. Allowed time for patient to ask questions. Pt. acknowledge understanding.

## 2013-03-18 ENCOUNTER — Other Ambulatory Visit: Payer: Self-pay

## 2013-05-03 ENCOUNTER — Other Ambulatory Visit: Payer: Self-pay | Admitting: Hematology and Oncology

## 2013-05-03 DIAGNOSIS — Z8521 Personal history of malignant neoplasm of larynx: Secondary | ICD-10-CM

## 2013-05-04 ENCOUNTER — Ambulatory Visit: Payer: Medicaid Other | Admitting: Hematology and Oncology

## 2013-05-04 ENCOUNTER — Encounter: Payer: Self-pay | Admitting: *Deleted

## 2013-05-04 ENCOUNTER — Other Ambulatory Visit: Payer: Medicaid Other

## 2013-05-18 ENCOUNTER — Telehealth: Payer: Self-pay | Admitting: Hematology and Oncology

## 2013-05-18 NOTE — Telephone Encounter (Signed)
returned pt call and s.w pt mother and advised on new appt d.t ...ok and aware

## 2013-05-25 ENCOUNTER — Encounter: Payer: Self-pay | Admitting: *Deleted

## 2013-05-25 ENCOUNTER — Ambulatory Visit: Payer: Medicaid Other | Admitting: Hematology and Oncology

## 2013-05-25 ENCOUNTER — Other Ambulatory Visit: Payer: Medicaid Other

## 2013-06-14 ENCOUNTER — Telehealth: Payer: Self-pay | Admitting: Hematology and Oncology

## 2013-06-14 NOTE — Telephone Encounter (Signed)
pt called to r/s missed lb/fu and was given new appt for lb/NG 2/5 @ 1pm.

## 2013-06-17 ENCOUNTER — Encounter: Payer: Self-pay | Admitting: Hematology and Oncology

## 2013-06-17 ENCOUNTER — Telehealth: Payer: Self-pay | Admitting: Hematology and Oncology

## 2013-06-17 ENCOUNTER — Ambulatory Visit (HOSPITAL_BASED_OUTPATIENT_CLINIC_OR_DEPARTMENT_OTHER): Payer: Medicaid Other | Admitting: Hematology and Oncology

## 2013-06-17 ENCOUNTER — Other Ambulatory Visit (HOSPITAL_BASED_OUTPATIENT_CLINIC_OR_DEPARTMENT_OTHER): Payer: Medicaid Other

## 2013-06-17 VITALS — BP 119/80 | HR 83 | Temp 97.1°F | Resp 18 | Ht 61.0 in | Wt 92.3 lb

## 2013-06-17 DIAGNOSIS — Z8521 Personal history of malignant neoplasm of larynx: Secondary | ICD-10-CM

## 2013-06-17 DIAGNOSIS — K089 Disorder of teeth and supporting structures, unspecified: Secondary | ICD-10-CM

## 2013-06-17 DIAGNOSIS — D649 Anemia, unspecified: Secondary | ICD-10-CM

## 2013-06-17 DIAGNOSIS — Z9189 Other specified personal risk factors, not elsewhere classified: Secondary | ICD-10-CM

## 2013-06-17 DIAGNOSIS — C329 Malignant neoplasm of larynx, unspecified: Secondary | ICD-10-CM

## 2013-06-17 DIAGNOSIS — D6489 Other specified anemias: Secondary | ICD-10-CM

## 2013-06-17 DIAGNOSIS — D638 Anemia in other chronic diseases classified elsewhere: Secondary | ICD-10-CM | POA: Insufficient documentation

## 2013-06-17 DIAGNOSIS — E039 Hypothyroidism, unspecified: Secondary | ICD-10-CM

## 2013-06-17 DIAGNOSIS — D72819 Decreased white blood cell count, unspecified: Secondary | ICD-10-CM

## 2013-06-17 HISTORY — DX: Other specified personal risk factors, not elsewhere classified: Z91.89

## 2013-06-17 HISTORY — DX: Anemia, unspecified: D64.9

## 2013-06-17 HISTORY — DX: Hypothyroidism, unspecified: E03.9

## 2013-06-17 LAB — COMPREHENSIVE METABOLIC PANEL (CC13)
ALT: 14 U/L (ref 0–55)
AST: 30 U/L (ref 5–34)
Albumin: 3.7 g/dL (ref 3.5–5.0)
Alkaline Phosphatase: 131 U/L (ref 40–150)
Anion Gap: 11 mEq/L (ref 3–11)
BILIRUBIN TOTAL: 0.24 mg/dL (ref 0.20–1.20)
BUN: 4.8 mg/dL — ABNORMAL LOW (ref 7.0–26.0)
CO2: 19 mEq/L — ABNORMAL LOW (ref 22–29)
CREATININE: 0.6 mg/dL (ref 0.6–1.1)
Calcium: 9.4 mg/dL (ref 8.4–10.4)
Chloride: 106 mEq/L (ref 98–109)
Glucose: 77 mg/dl (ref 70–140)
Potassium: 3.9 mEq/L (ref 3.5–5.1)
SODIUM: 136 meq/L (ref 136–145)
TOTAL PROTEIN: 6.8 g/dL (ref 6.4–8.3)

## 2013-06-17 LAB — CBC WITH DIFFERENTIAL/PLATELET
BASO%: 1.3 % (ref 0.0–2.0)
BASOS ABS: 0 10*3/uL (ref 0.0–0.1)
EOS%: 3.1 % (ref 0.0–7.0)
Eosinophils Absolute: 0.1 10*3/uL (ref 0.0–0.5)
HEMATOCRIT: 35.2 % (ref 34.8–46.6)
HEMOGLOBIN: 10.8 g/dL — AB (ref 11.6–15.9)
LYMPH%: 21.6 % (ref 14.0–49.7)
MCH: 28.1 pg (ref 25.1–34.0)
MCHC: 30.7 g/dL — ABNORMAL LOW (ref 31.5–36.0)
MCV: 91.7 fL (ref 79.5–101.0)
MONO#: 0.5 10*3/uL (ref 0.1–0.9)
MONO%: 16 % — AB (ref 0.0–14.0)
NEUT#: 1.9 10*3/uL (ref 1.5–6.5)
NEUT%: 58 % (ref 38.4–76.8)
Platelets: 454 10*3/uL — ABNORMAL HIGH (ref 145–400)
RBC: 3.84 10*6/uL (ref 3.70–5.45)
RDW: 17.4 % — ABNORMAL HIGH (ref 11.2–14.5)
WBC: 3.2 10*3/uL — ABNORMAL LOW (ref 3.9–10.3)
lymph#: 0.7 10*3/uL — ABNORMAL LOW (ref 0.9–3.3)

## 2013-06-17 LAB — TSH CHCC: TSH: 1.542 m(IU)/L (ref 0.308–3.960)

## 2013-06-17 LAB — T4, FREE: Free T4: 0.83 ng/dL (ref 0.80–1.80)

## 2013-06-17 NOTE — Progress Notes (Signed)
[facility] OFFICE PROGRESS NOTE  Patient Care Team: Benito Mccreedy, MD as PCP - General (Internal Medicine) Brooks Sailors, RN as Registered Nurse Chester Holstein (Family Medicine) Heath Lark, MD as Consulting Physician (Hematology and Oncology)  DIAGNOSIS: T3, N0, M0 laryngeal cancer  SUMMARY OF ONCOLOGIC HISTORY: This is a pleasant 38 year old lady with diagnosis of laryngeal cancer from May of 2012. According to the patient, she presented with sensation of sore throat. She was treated with chemotherapy weekly along with radiation therapy. The patient had placement of tracheostomy.  INTERVAL HISTORY: Crystal Spence 38 y.o. female returns for further followup. She denies any recent sore throat. Denies any sensation of dry mouth or difficulties with chewing food or swallowing. Denies any recent infection. She denies any recent fever, chills, night sweats or abnormal weight loss  I have reviewed the past medical history, past surgical history, social history and family history with the patient and they are unchanged from previous note.  ALLERGIES:  has No Known Allergies.  MEDICATIONS:  Current Outpatient Prescriptions  Medication Sig Dispense Refill  . acetaminophen (TYLENOL) 500 MG tablet Take 1 tablet (500 mg total) by mouth every 4 (four) hours as needed for pain.  30 tablet  0  . losartan (COZAAR) 25 MG tablet Take 1 tablet (25 mg total) by mouth daily. Resume in 2 days.  30 tablet  0  . pantoprazole (PROTONIX) 40 MG tablet Take 1 tablet (40 mg total) by mouth 2 (two) times daily.  60 tablet  0  . PRESCRIPTION MEDICATION Inject 1 each into the muscle every other day. Allergy shots prescribed by Dr. Lianne Moris at [facility].      . topiramate (TOPAMAX) 50 MG tablet Take 50 mg by mouth 2 (two) times daily.      . traZODone (DESYREL) 50 MG tablet Take 50 mg by mouth at bedtime.       No current facility-administered medications for this  visit.    REVIEW OF SYSTEMS:   Constitutional: Denies fevers, chills or abnormal weight loss Eyes: Denies blurriness of vision Ears, nose, mouth, throat, and face: Denies mucositis or sore throat Respiratory: Denies cough, dyspnea or wheezes Cardiovascular: Denies palpitation, chest discomfort or lower extremity swelling Gastrointestinal:  Denies nausea, heartburn or change in bowel habits Skin: Denies abnormal skin rashes Lymphatics: Denies new lymphadenopathy or easy bruising Neurological:Denies numbness, tingling or new weaknesses Behavioral/Psych: Mood is stable, no new changes  All other systems were reviewed with the patient and are negative.  PHYSICAL EXAMINATION: ECOG PERFORMANCE STATUS: 0 - Asymptomatic  Filed Vitals:   06/17/13 1246  BP: 119/80  Pulse: 83  Temp: 97.1 F (36.2 C)  Resp: 18   Filed Weights   06/17/13 1246  Weight: 92 lb 4.8 oz (41.867 kg)    GENERAL:alert, no distress and comfortable SKIN: skin color, texture, turgor are normal, no rashes or significant lesions EYES: normal, Conjunctiva are pink and non-injected, sclera clear OROPHARYNX:no exudate, no erythema and lips, buccal mucosa, and tongue normal . Noted poor dentition NECK: supple, with tracheostomy in situ, no evidence of infection LYMPH:  no palpable lymphadenopathy in the cervical, axillary or inguinal LUNGS: clear to auscultation and percussion with normal breathing effort HEART: regular rate & rhythm and no murmurs and no lower extremity edema ABDOMEN:abdomen soft, non-tender and normal bowel sounds Musculoskeletal:no cyanosis of digits and no clubbing  NEURO: alert & oriented x 3 with fluent speech, no focal motor/sensory deficits  LABORATORY DATA:  I have reviewed the data as listed    Component Value Date/Time   NA 136 06/17/2013 1234   NA 133* 02/05/2013 0455   K 3.9 06/17/2013 1234   K 3.6 02/05/2013 0455   CL 104 02/05/2013 0455   CO2 19* 06/17/2013 1234   CO2 23 02/05/2013 0455    GLUCOSE 77 06/17/2013 1234   GLUCOSE 93 02/05/2013 0455   BUN 4.8* 06/17/2013 1234   BUN 5* 02/05/2013 0455   CREATININE 0.6 06/17/2013 1234   CREATININE 0.69 02/05/2013 0455   CALCIUM 9.4 06/17/2013 1234   CALCIUM 8.1* 02/05/2013 0455   PROT 6.8 06/17/2013 1234   PROT 4.5* 02/03/2013 0145   ALBUMIN 3.7 06/17/2013 1234   ALBUMIN 2.4* 02/03/2013 0145   AST 30 06/17/2013 1234   AST 14 02/03/2013 0145   ALT 14 06/17/2013 1234   ALT 5 02/03/2013 0145   ALKPHOS 131 06/17/2013 1234   ALKPHOS 49 02/03/2013 0145   BILITOT 0.24 06/17/2013 1234   BILITOT 1.1 02/03/2013 0145   GFRNONAA >90 02/05/2013 0455   GFRAA >90 02/05/2013 0455    No results found for this basename: SPEP, UPEP,  kappa and lambda light chains    Lab Results  Component Value Date   WBC 3.2* 06/17/2013   NEUTROABS 1.9 06/17/2013   HGB 10.8* 06/17/2013   HCT 35.2 06/17/2013   MCV 91.7 06/17/2013   PLT 454* 06/17/2013      Chemistry      Component Value Date/Time   NA 136 06/17/2013 1234   NA 133* 02/05/2013 0455   K 3.9 06/17/2013 1234   K 3.6 02/05/2013 0455   CL 104 02/05/2013 0455   CO2 19* 06/17/2013 1234   CO2 23 02/05/2013 0455   BUN 4.8* 06/17/2013 1234   BUN 5* 02/05/2013 0455   CREATININE 0.6 06/17/2013 1234   CREATININE 0.69 02/05/2013 0455      Component Value Date/Time   CALCIUM 9.4 06/17/2013 1234   CALCIUM 8.1* 02/05/2013 0455   ALKPHOS 131 06/17/2013 1234   ALKPHOS 49 02/03/2013 0145   AST 30 06/17/2013 1234   AST 14 02/03/2013 0145   ALT 14 06/17/2013 1234   ALT 5 02/03/2013 0145   BILITOT 0.24 06/17/2013 1234   BILITOT 1.1 02/03/2013 0145     ASSESSMENT & PLAN:  #1 laryngeal cancer She has no evidence of disease recurrence. She will continue ENT followup #2 poor dentition I recommend evaluation back and this year. She is at high risk of osteonecrosis of the jaw #3 mild leukopenia Causes unknown. Could be due to recent viral infection. I will observe #4 chronic anemia #5 mild thrombocytosis I suspect this is anemia chronic disease. Given  borderline high platelet count, I will order iron studies with her next visit to rule out iron deficiency anemia. #6 preventive care She has received influenza vaccination recently. I will check her thyroid function tests with each visit due to high risk of hypothyroidism. All her age appropriate screening programs are up-to-date. Orders Placed This Encounter  Procedures  . CBC & Diff and Retic    Standing Status: Future     Number of Occurrences:      Standing Expiration Date: 06/17/2014  . T4, free    Standing Status: Future     Number of Occurrences:      Standing Expiration Date: 06/17/2014  . TSH    Standing Status: Future     Number of Occurrences:      Standing  Expiration Date: 06/17/2014  . Comprehensive metabolic panel    Standing Status: Future     Number of Occurrences:      Standing Expiration Date: 06/17/2014  . Ferritin    Standing Status: Future     Number of Occurrences:      Standing Expiration Date: 06/17/2014  . Iron and TIBC    Standing Status: Future     Number of Occurrences:      Standing Expiration Date: 06/17/2014  . Sedimentation rate    Standing Status: Future     Number of Occurrences:      Standing Expiration Date: 06/17/2014  . Vitamin B12    Standing Status: Future     Number of Occurrences:      Standing Expiration Date: 06/17/2014  . Ambulatory referral to Dentistry    Referral Priority:  Routine    Referral Type:  Consultation    Referral Reason:  Specialty Services Required    Requested Specialty:  Dental General Practice    Number of Visits Requested:  1   All questions were answered. The patient knows to call the clinic with any problems, questions or concerns. No barriers to learning was detected. I spent 25 minutes counseling the patient face to face. The total time spent in the appointment was 40 minutes and more than 50% was on counseling and review of test results     [facility], [place], MD 06/17/2013 2:35 PM

## 2013-06-17 NOTE — Telephone Encounter (Signed)
Gave pt appt for lab and MD for August and contacted Dr. Enrique Sack talked to Marlyce Huge her referral,

## 2013-06-18 ENCOUNTER — Telehealth: Payer: Self-pay | Admitting: Hematology and Oncology

## 2013-06-18 NOTE — Telephone Encounter (Signed)
Dr. Enrique Sack called back regarding dental evaluation, pt not qualified for service since not on active treatment, he suggested for pt to go back to [facility]  for dentral services or local dentist , notified nurse

## 2013-12-06 ENCOUNTER — Emergency Department (HOSPITAL_COMMUNITY): Payer: Medicaid Other

## 2013-12-06 ENCOUNTER — Emergency Department (HOSPITAL_COMMUNITY)
Admission: EM | Admit: 2013-12-06 | Discharge: 2013-12-06 | Disposition: A | Discharge status: Home / Self Care | Payer: Medicaid Other | Attending: Emergency Medicine | Admitting: Emergency Medicine

## 2013-12-06 ENCOUNTER — Encounter (HOSPITAL_COMMUNITY): Payer: Self-pay | Admitting: Emergency Medicine

## 2013-12-06 DIAGNOSIS — I10 Essential (primary) hypertension: Secondary | ICD-10-CM | POA: Diagnosis not present

## 2013-12-06 DIAGNOSIS — Z87891 Personal history of nicotine dependence: Secondary | ICD-10-CM | POA: Insufficient documentation

## 2013-12-06 DIAGNOSIS — Z8639 Personal history of other endocrine, nutritional and metabolic disease: Secondary | ICD-10-CM | POA: Insufficient documentation

## 2013-12-06 DIAGNOSIS — R112 Nausea with vomiting, unspecified: Secondary | ICD-10-CM | POA: Diagnosis not present

## 2013-12-06 DIAGNOSIS — Z79899 Other long term (current) drug therapy: Secondary | ICD-10-CM | POA: Insufficient documentation

## 2013-12-06 DIAGNOSIS — R1013 Epigastric pain: Secondary | ICD-10-CM | POA: Diagnosis not present

## 2013-12-06 DIAGNOSIS — Z8521 Personal history of malignant neoplasm of larynx: Secondary | ICD-10-CM | POA: Insufficient documentation

## 2013-12-06 DIAGNOSIS — Z85819 Personal history of malignant neoplasm of unspecified site of lip, oral cavity, and pharynx: Secondary | ICD-10-CM | POA: Insufficient documentation

## 2013-12-06 DIAGNOSIS — Z862 Personal history of diseases of the blood and blood-forming organs and certain disorders involving the immune mechanism: Secondary | ICD-10-CM | POA: Insufficient documentation

## 2013-12-06 DIAGNOSIS — R1012 Left upper quadrant pain: Secondary | ICD-10-CM | POA: Insufficient documentation

## 2013-12-06 DIAGNOSIS — E876 Hypokalemia: Secondary | ICD-10-CM | POA: Insufficient documentation

## 2013-12-06 DIAGNOSIS — Z9889 Other specified postprocedural states: Secondary | ICD-10-CM | POA: Diagnosis not present

## 2013-12-06 LAB — COMPREHENSIVE METABOLIC PANEL
ALBUMIN: 3.6 g/dL (ref 3.5–5.2)
ALT: 12 U/L (ref 0–35)
AST: 24 U/L (ref 0–37)
Alkaline Phosphatase: 122 U/L — ABNORMAL HIGH (ref 39–117)
Anion gap: 11 (ref 5–15)
BUN: 7 mg/dL (ref 6–23)
CHLORIDE: 97 meq/L (ref 96–112)
CO2: 30 mEq/L (ref 19–32)
CREATININE: 0.83 mg/dL (ref 0.50–1.10)
Calcium: 11.1 mg/dL — ABNORMAL HIGH (ref 8.4–10.5)
GFR calc Af Amer: >90 mL/min (ref >90)
GFR calc non Af Amer: 87 mL/min — ABNORMAL LOW (ref >90)
Glucose, Bld: 113 mg/dL — ABNORMAL HIGH (ref 70–99)
Potassium: 2.9 mEq/L — CL (ref 3.7–5.3)
Sodium: 138 mEq/L (ref 137–147)
TOTAL PROTEIN: 6.9 g/dL (ref 6.0–8.3)
Total Bilirubin: <0.2 mg/dL — ABNORMAL LOW (ref 0.3–1.2)

## 2013-12-06 LAB — CBC WITH DIFFERENTIAL/PLATELET
BASOS PCT: 1 % (ref 0–1)
Basophils Absolute: 0.1 10*3/uL (ref 0.0–0.1)
EOS ABS: 0 10*3/uL (ref 0.0–0.7)
Eosinophils Relative: 1 % (ref 0–5)
HCT: 33.1 % — ABNORMAL LOW (ref 36.0–46.0)
Hemoglobin: 9.6 g/dL — ABNORMAL LOW (ref 12.0–15.0)
Lymphocytes Relative: 7 % — ABNORMAL LOW (ref 12–46)
Lymphs Abs: 0.5 10*3/uL — ABNORMAL LOW (ref 0.7–4.0)
MCH: 23.9 pg — ABNORMAL LOW (ref 26.0–34.0)
MCHC: 29 g/dL — AB (ref 30.0–36.0)
MCV: 82.5 fL (ref 78.0–100.0)
Monocytes Absolute: 0.9 10*3/uL (ref 0.1–1.0)
Monocytes Relative: 15 % — ABNORMAL HIGH (ref 3–12)
NEUTROS ABS: 4.7 10*3/uL (ref 1.7–7.7)
NEUTROS PCT: 76 % (ref 43–77)
Platelets: 425 10*3/uL — ABNORMAL HIGH (ref 150–400)
RBC: 4.01 MIL/uL (ref 3.87–5.11)
RDW: 20.2 % — ABNORMAL HIGH (ref 11.5–15.5)
WBC: 6.1 10*3/uL (ref 4.0–10.5)

## 2013-12-06 LAB — URINALYSIS, ROUTINE W REFLEX MICROSCOPIC
BILIRUBIN URINE: NEGATIVE
Glucose, UA: NEGATIVE mg/dL
Hgb urine dipstick: NEGATIVE
Ketones, ur: NEGATIVE mg/dL
LEUKOCYTES UA: NEGATIVE
NITRITE: NEGATIVE
PH: 7 (ref 5.0–8.0)
Protein, ur: NEGATIVE mg/dL
SPECIFIC GRAVITY, URINE: 1.017 (ref 1.005–1.030)
Urobilinogen, UA: 0.2 mg/dL (ref 0.0–1.0)

## 2013-12-06 LAB — LIPASE, BLOOD: LIPASE: 40 U/L (ref 11–59)

## 2013-12-06 LAB — HCG, SERUM, QUALITATIVE: PREG SERUM: NEGATIVE

## 2013-12-06 MED ORDER — POTASSIUM CHLORIDE 10 MEQ/100ML IV SOLN: 10.0000 meq | Freq: Once | INTRAVENOUS | Status: DC

## 2013-12-06 MED ORDER — HYDROCODONE-ACETAMINOPHEN 5-325 MG PO TABS: 1.0000 | ORAL_TABLET | ORAL | Status: DC | PRN

## 2013-12-06 MED ORDER — POTASSIUM CHLORIDE 10 MEQ/100ML IV SOLN
10.0000 meq | INTRAVENOUS | Status: AC
  Administered 2013-12-06 (×2): 10 meq via INTRAVENOUS
  Filled 2013-12-06 (×2): qty 100

## 2013-12-06 MED ORDER — ONDANSETRON HCL 4 MG/2ML IJ SOLN
4.0000 mg | Freq: Once | INTRAMUSCULAR | Status: AC
  Administered 2013-12-06: 4 mg via INTRAVENOUS
  Filled 2013-12-06: qty 2

## 2013-12-06 MED ORDER — IOHEXOL 300 MG/ML  SOLN: 25.0000 mL | Freq: Once | INTRAMUSCULAR | Status: DC | PRN

## 2013-12-06 MED ORDER — POTASSIUM CHLORIDE ER 10 MEQ PO TBCR: 10.0000 meq | EXTENDED_RELEASE_TABLET | Freq: Every day | ORAL | Status: DC

## 2013-12-06 MED ORDER — IOHEXOL 300 MG/ML  SOLN
80.0000 mL | Freq: Once | INTRAMUSCULAR | Status: AC | PRN
  Administered 2013-12-06: 80 mL via INTRAVENOUS

## 2013-12-06 MED ORDER — MORPHINE SULFATE 4 MG/ML IJ SOLN
4.0000 mg | Freq: Once | INTRAMUSCULAR | Status: AC
  Administered 2013-12-06: 4 mg via INTRAVENOUS
  Filled 2013-12-06: qty 1

## 2013-12-06 NOTE — ED Notes (Signed)
Pt taken to CT.

## 2013-12-06 NOTE — ED Notes (Signed)
Pt drinking contrast. 

## 2013-12-06 NOTE — ED Provider Notes (Signed)
CSN: [id number]     Arrival date & time 12/06/13  1651 History   First MD Initiated Contact with Patient 12/06/13 1823     Chief Complaint  Patient presents with  . Abdominal Pain    Patient is a 38 y.o. female presenting with abdominal pain. The history is provided by the patient.  Abdominal Pain Pain location:  LUQ Pain quality: sharp   Pain radiates to:  Does not radiate Pain severity:  Moderate Onset quality:  Gradual Duration:  1 week Timing:  Constant Relieved by: Sometimes drinking mild makes it feel better. Associated symptoms: nausea and vomiting   Associated symptoms: no diarrhea, no dysuria and no fever     Past Medical History  Diagnosis Date  . Throat cancer   . Hypertension   . History of laryngeal cancer 02/03/2013  . Anemia, unspecified 06/17/2013  . Hypothyroidism (acquired) 06/17/2013  . Poor oral hygiene 06/17/2013   Past Surgical History  Procedure Laterality Date  . Abdominal surgery    . Tracheostomy      for throat inflammation from radiation  . Esophagogastroduodenoscopy N/A 02/03/2013    Procedure: ESOPHAGOGASTRODUODENOSCOPY (EGD);  Surgeon: Juanita Craver, MD;  Location: [facility];  Service: Endoscopy;  Laterality: N/A;   Family History  Problem Relation Age of Onset  . Cancer Maternal Grandmother     skin cancer   History  Substance Use Topics  . Smoking status: Former Smoker -- 1.00 packs/day for 15 years    Quit date: 09/11/2010  . Smokeless tobacco: Never Used  . Alcohol Use: No   OB History   Grav Para Term Preterm Abortions TAB SAB Ect Mult Living                 Review of Systems  Constitutional: Negative for fever.  Gastrointestinal: Positive for nausea, vomiting and abdominal pain. Negative for diarrhea.  Genitourinary: Negative for dysuria.  All other systems reviewed and are negative.     Allergies  Review of patient's allergies indicates no known allergies.  Home Medications   Prior to Admission medications    Medication Sig Start Date End Date Taking? Authorizing Provider  acetaminophen (TYLENOL) 500 MG tablet Take 1 tablet (500 mg total) by mouth every 4 (four) hours as needed for pain. 02/05/13  Yes Eugenie Filler, MD  topiramate (TOPAMAX) 50 MG tablet Take 50 mg by mouth 2 (two) times daily.   Yes Historical Provider, MD  traZODone (DESYREL) 50 MG tablet Take 50 mg by mouth at bedtime.   Yes Historical Provider, MD  HYDROcodone-acetaminophen (NORCO/VICODIN) 5-325 MG per tablet Take 1-2 tablets by mouth every 4 (four) hours as needed. 12/06/13   Dorie Rank, MD  potassium chloride (K-DUR) 10 MEQ tablet Take 1 tablet (10 mEq total) by mouth daily. 12/06/13   Dorie Rank, MD   BP 157/101  Pulse 73  Temp(Src) 98.2 F (36.8 C) (Oral)  Resp 20  SpO2 98% Physical Exam  Nursing note and vitals reviewed. Constitutional: No distress.  HENT:  Head: Normocephalic and atraumatic.  Right Ear: External ear normal.  Left Ear: External ear normal.  Tracheostomy present   Eyes: Conjunctivae are normal. Right eye exhibits no discharge. Left eye exhibits no discharge. No scleral icterus.  Neck: Neck supple. No tracheal deviation present.  Cardiovascular: Normal rate, regular rhythm and intact distal pulses.   Pulmonary/Chest: Effort normal and breath sounds normal. No stridor. No respiratory distress. She has no wheezes. She has no rales.  Abdominal: Soft.  Bowel sounds are normal. She exhibits no distension. There is tenderness in the epigastric area. There is no rebound and no guarding. No hernia.  Musculoskeletal: She exhibits no edema and no tenderness.  Neurological: She is alert. She has normal strength. No cranial nerve deficit (no facial droop, extraocular movements intact, no slurred speech) or sensory deficit. She exhibits normal muscle tone. She displays no seizure activity. Coordination normal.  Skin: Skin is warm and dry. No rash noted.  Psychiatric: She has a normal mood and affect.    ED  Course  Procedures (including critical care time) Labs Review Labs Reviewed  CBC WITH DIFFERENTIAL - Abnormal; Notable for the following:    Hemoglobin 9.6 (*)    HCT 33.1 (*)    MCH 23.9 (*)    MCHC 29.0 (*)    RDW 20.2 (*)    Platelets 425 (*)    Lymphocytes Relative 7 (*)    Lymphs Abs 0.5 (*)    Monocytes Relative 15 (*)    All other components within normal limits  COMPREHENSIVE METABOLIC PANEL - Abnormal; Notable for the following:    Potassium 2.9 (*)    Glucose, Bld 113 (*)    Calcium 11.1 (*)    Alkaline Phosphatase 122 (*)    Total Bilirubin <0.2 (*)    GFR calc non Af Amer 87 (*)    All other components within normal limits  URINALYSIS, ROUTINE W REFLEX MICROSCOPIC - Abnormal; Notable for the following:    APPearance CLOUDY (*)    All other components within normal limits  LIPASE, BLOOD  HCG, SERUM, QUALITATIVE    Imaging Review Ct Abdomen Pelvis W Contrast  12/06/2013   CLINICAL DATA:  Left upper quadrant abdominal pain for 1 week. Nausea and vomiting.  EXAM: CT ABDOMEN AND PELVIS WITH CONTRAST  TECHNIQUE: Multidetector CT imaging of the abdomen and pelvis was performed using the standard protocol following bolus administration of intravenous contrast.  CONTRAST:  59mL OMNIPAQUE IOHEXOL 300 MG/ML  SOLN  COMPARISON:  10/21/2012  FINDINGS: Mild dependent atelectasis in the left lung base. Calcified granulomas in the lung bases bilaterally.  The liver, spleen, gallbladder, pancreas, adrenal glands, kidneys, abdominal aorta, inferior vena cava, and retroperitoneal lymph nodes are unremarkable. Small accessory spleen. Stomach, small bowel, and colon are normal for degree of distention. The colon is diffusely stool-filled. No free air or free fluid in the abdomen.  Pelvis: Small amount of free fluid in the pelvis, likely physiologic. Bladder wall is not thickened. Uterus and ovaries are not enlarged. No abnormal pelvic mass or lymphadenopathy. Appendix is normal. No evidence of  diverticulitis. 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 No destructive bone lesions.  IMPRESSION: No acute process demonstrated in the abdomen or pelvis. Small amount of free fluid in the pelvis is likely physiologic. Prominent left gonadal vein varices. No change since prior study.   Electronically Signed   By: Lucienne Capers M.D.   On: 12/06/2013 21:38   Medications  iohexol (OMNIPAQUE) 300 MG/ML solution 25 mL (not administered)  potassium chloride 10 mEq in 100 mL IVPB (10 mEq Intravenous New Bag/Given 12/06/13 2058)  morphine 4 MG/ML injection 4 mg (4 mg Intravenous Given 12/06/13 1905)  ondansetron (ZOFRAN) injection 4 mg (4 mg Intravenous Given 12/06/13 1905)  iohexol (OMNIPAQUE) 300 MG/ML solution 80 mL (80 mLs Intravenous Contrast Given 12/06/13 2109)     MDM   Final diagnoses:  Left upper quadrant pain  Hypokalemia  Hypercalcemia  Repeat exam, ttp on left lower rib margin.  Pt was lifting some objects the other day.  Could be related to muscular strain.  Potassium was low and calcium is elevated.  Will have her follow up with her PCP to have that repeated considering her oncology history.  Dc home with pain meds and potassium supplement.    Dorie Rank, MD 12/06/13 [phone]

## 2013-12-06 NOTE — ED Notes (Signed)
Patient presents today with a chief complaint of LUQ abdominal pain x 1 week with nausea and vomiting. Patient reports nothing makes pain worse but states drinking milk occasionally improves pain. Last BM 12/04/13.

## 2013-12-06 NOTE — ED Notes (Signed)
Dr J Knapp at bedside 

## 2013-12-06 NOTE — Discharge Instructions (Signed)

## 2013-12-15 ENCOUNTER — Other Ambulatory Visit: Payer: Medicaid Other

## 2013-12-15 ENCOUNTER — Other Ambulatory Visit: Payer: Self-pay | Admitting: *Deleted

## 2013-12-15 ENCOUNTER — Ambulatory Visit: Payer: Medicaid Other | Admitting: Hematology and Oncology

## 2013-12-17 ENCOUNTER — Telehealth: Payer: Self-pay | Admitting: Hematology and Oncology

## 2013-12-17 NOTE — Telephone Encounter (Signed)
s.w. pt and advised on Aug appt....pt ok and aware °

## 2014-01-05 ENCOUNTER — Telehealth: Payer: Self-pay | Admitting: Hematology and Oncology

## 2014-01-05 ENCOUNTER — Ambulatory Visit (HOSPITAL_BASED_OUTPATIENT_CLINIC_OR_DEPARTMENT_OTHER): Payer: Medicaid Other | Admitting: Hematology and Oncology

## 2014-01-05 ENCOUNTER — Encounter: Payer: Self-pay | Admitting: Hematology and Oncology

## 2014-01-05 ENCOUNTER — Other Ambulatory Visit (HOSPITAL_BASED_OUTPATIENT_CLINIC_OR_DEPARTMENT_OTHER): Payer: Medicaid Other

## 2014-01-05 VITALS — BP 137/93 | HR 99 | Temp 99.1°F | Resp 17 | Ht 61.0 in | Wt 87.3 lb

## 2014-01-05 DIAGNOSIS — R634 Abnormal weight loss: Secondary | ICD-10-CM | POA: Insufficient documentation

## 2014-01-05 DIAGNOSIS — Z8521 Personal history of malignant neoplasm of larynx: Secondary | ICD-10-CM

## 2014-01-05 DIAGNOSIS — R0781 Pleurodynia: Secondary | ICD-10-CM

## 2014-01-05 DIAGNOSIS — D649 Anemia, unspecified: Secondary | ICD-10-CM

## 2014-01-05 DIAGNOSIS — C329 Malignant neoplasm of larynx, unspecified: Secondary | ICD-10-CM

## 2014-01-05 DIAGNOSIS — Z930 Tracheostomy status: Secondary | ICD-10-CM

## 2014-01-05 DIAGNOSIS — R079 Chest pain, unspecified: Secondary | ICD-10-CM

## 2014-01-05 DIAGNOSIS — E039 Hypothyroidism, unspecified: Secondary | ICD-10-CM

## 2014-01-05 HISTORY — DX: Pleurodynia: R07.81

## 2014-01-05 LAB — CBC & DIFF AND RETIC
BASO%: 0.2 % (ref 0.0–2.0)
Basophils Absolute: 0 10*3/uL (ref 0.0–0.1)
EOS ABS: 0.1 10*3/uL (ref 0.0–0.5)
EOS%: 0.8 % (ref 0.0–7.0)
HCT: 32.4 % — ABNORMAL LOW (ref 34.8–46.6)
HGB: 9.5 g/dL — ABNORMAL LOW (ref 11.6–15.9)
Immature Retic Fract: 18.7 % — ABNORMAL HIGH (ref 1.60–10.00)
LYMPH#: 0.4 10*3/uL — AB (ref 0.9–3.3)
LYMPH%: 4.6 % — ABNORMAL LOW (ref 14.0–49.7)
MCH: 23.5 pg — ABNORMAL LOW (ref 25.1–34.0)
MCHC: 29.3 g/dL — ABNORMAL LOW (ref 31.5–36.0)
MCV: 80.2 fL (ref 79.5–101.0)
MONO#: 0.6 10*3/uL (ref 0.1–0.9)
MONO%: 6.3 % (ref 0.0–14.0)
NEUT%: 88.1 % — ABNORMAL HIGH (ref 38.4–76.8)
NEUTROS ABS: 7.8 10*3/uL — AB (ref 1.5–6.5)
Platelets: 563 10*3/uL — ABNORMAL HIGH (ref 145–400)
RBC: 4.04 10*6/uL (ref 3.70–5.45)
RDW: 21.3 % — AB (ref 11.2–14.5)
RETIC %: 1.31 % (ref 0.70–2.10)
RETIC CT ABS: 52.92 10*3/uL (ref 33.70–90.70)
WBC: 8.8 10*3/uL (ref 3.9–10.3)

## 2014-01-05 LAB — COMPREHENSIVE METABOLIC PANEL (CC13)
ALT: 8 U/L (ref 0–55)
ANION GAP: 9 meq/L (ref 3–11)
AST: 18 U/L (ref 5–34)
Albumin: 3 g/dL — ABNORMAL LOW (ref 3.5–5.0)
Alkaline Phosphatase: 228 U/L — ABNORMAL HIGH (ref 40–150)
BUN: 6.1 mg/dL — AB (ref 7.0–26.0)
CO2: 24 mEq/L (ref 22–29)
CREATININE: 0.7 mg/dL (ref 0.6–1.1)
Calcium: 9.9 mg/dL (ref 8.4–10.4)
Chloride: 104 mEq/L (ref 98–109)
Glucose: 91 mg/dl (ref 70–140)
Potassium: 3.4 mEq/L — ABNORMAL LOW (ref 3.5–5.1)
Sodium: 137 mEq/L (ref 136–145)
Total Bilirubin: 0.24 mg/dL (ref 0.20–1.20)
Total Protein: 7.3 g/dL (ref 6.4–8.3)

## 2014-01-05 LAB — IRON AND TIBC CHCC
%SAT: 3 % — ABNORMAL LOW (ref 21–57)
IRON: 12 ug/dL — AB (ref 41–142)
TIBC: 346 ug/dL (ref 236–444)
UIBC: 335 ug/dL (ref 120–384)

## 2014-01-05 LAB — FERRITIN CHCC: Ferritin: 13 ng/ml (ref 9–269)

## 2014-01-05 LAB — TSH CHCC: TSH: 2.061 m[IU]/L (ref 0.308–3.960)

## 2014-01-05 NOTE — Assessment & Plan Note (Signed)
Clinically, she has no signs of disease recurrence. However, I am concerned about the elevated alkaline phosphatase, unexplained weight loss and recent hypercalcemia. I recommend CT scan of the neck,  chest and bone scan for further evaluation and she agreed.

## 2014-01-05 NOTE — Assessment & Plan Note (Signed)
Imaging study for assessment and to exclude disease recurrence.

## 2014-01-05 NOTE — Assessment & Plan Note (Signed)
Cause is unknown. I will order further imaging study to exclude disease recurrence and bony metastasis. She was prescribed pain medicine by her PCP. I recommend further imaging studies for evaluation and she agreed.

## 2014-01-05 NOTE — Progress Notes (Signed)
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 According to the patient, she presented with sensation of sore throat. She was treated with chemotherapy weekly along with radiation therapy. The patient had placement of tracheostomy.  INTERVAL HISTORY: Please see below for problem oriented charting. The patient had unexplained 5 pound weight loss and new onset of left-sided rib pain. She went to the emergency department last month for assessment and was noted to have hypercalcemia, hypokalemia but with normal CT scan of the abdomen. She continues a persistent left-sided rib/chest wall pain she rated as moderate. She has not filled her prescription recently. She denies recent alcohol intake or smoking.  REVIEW OF SYSTEMS:   Constitutional: Denies fevers, chills  Eyes: Denies blurriness of vision Ears, nose, mouth, throat, and face: Denies mucositis or sore throat Respiratory: Denies cough, dyspnea or wheezes Cardiovascular: Denies palpitation, chest discomfort or lower extremity swelling Gastrointestinal:  Denies nausea, heartburn or change in bowel habits Skin: Denies abnormal skin rashes Lymphatics: Denies new lymphadenopathy or easy bruising Neurological:Denies numbness, tingling or new weaknesses Behavioral/Psych: Mood is stable, no new changes  All other systems were reviewed with the patient and are negative.  I have reviewed the past medical history, past surgical history, social history and family history with the patient and they are unchanged from previous note.  ALLERGIES:  has No Known Allergies.  MEDICATIONS:  Current Outpatient  Prescriptions  Medication Sig Dispense Refill  . acetaminophen (TYLENOL) 500 MG tablet Take 1 tablet (500 mg total) by mouth every 4 (four) hours as needed for pain.  30 tablet  0  . HYDROcodone-acetaminophen (NORCO/VICODIN) 5-325 MG per tablet Take 1-2 tablets by mouth every 4 (four) hours as needed.  16 tablet  0  . potassium chloride (K-DUR) 10 MEQ tablet Take 1 tablet (10 mEq total) by mouth daily.  7 tablet  0  . topiramate (TOPAMAX) 50 MG tablet Take 50 mg by mouth 2 (two) times daily.      . traZODone (DESYREL) 50 MG tablet Take 50 mg by mouth at bedtime.       No current facility-administered medications for this visit.    PHYSICAL EXAMINATION: ECOG PERFORMANCE STATUS: 1 - Symptomatic but completely ambulatory  Filed Vitals:   01/05/14 1155  BP: 137/93  Pulse: 99  Temp: 99.1 F (37.3 C)  Resp: 17   Filed Weights   01/05/14 1155  Weight: 87 lb 4.8 oz (39.599 kg)    GENERAL:alert, no distress and comfortable. She looks thin and mildly cachectic SKIN: skin color, texture, turgor are normal, no rashes or significant lesions EYES: normal, Conjunctiva are pink and non-injected, sclera clear OROPHARYNX:no exudate, no erythema and lips, buccal mucosa, and tongue normal . Poor dentition is noted NECK: supple, tracheostomy in situ. LYMPH:  no palpable lymphadenopathy in the cervical, axillary or inguinal LUNGS: clear to auscultation and percussion with normal breathing effort HEART: regular rate & rhythm and no murmurs and no lower extremity edema ABDOMEN:abdomen soft, non-tender and normal bowel sounds Musculoskeletal:no cyanosis of digits and no clubbing . There is tenderness on palpation of her rib cage. NEURO: alert & oriented x 3 with fluent speech, no focal motor/sensory deficits  LABORATORY DATA:  I have reviewed the data as listed    Component Value Date/Time   NA 137 01/05/2014 1124   NA 138 12/06/2013 1713   K 3.4* 01/05/2014 1124   K 2.9* 12/06/2013 1713   CL 97  12/06/2013 1713   CO2 24 01/05/2014 1124   CO2 30 12/06/2013 1713   GLUCOSE 91 01/05/2014 1124   GLUCOSE 113* 12/06/2013 1713   BUN 6.1* 01/05/2014 1124   BUN 7 12/06/2013 1713   CREATININE 0.7 01/05/2014 1124   CREATININE 0.83 12/06/2013 1713   CALCIUM 9.9 01/05/2014 1124   CALCIUM 11.1* 12/06/2013 1713   PROT 7.3 01/05/2014 1124   PROT 6.9 12/06/2013 1713   ALBUMIN 3.0* 01/05/2014 1124   ALBUMIN 3.6 12/06/2013 1713   AST 18 01/05/2014 1124   AST 24 12/06/2013 1713   ALT 8 01/05/2014 1124   ALT 12 12/06/2013 1713   ALKPHOS 228* 01/05/2014 1124   ALKPHOS 122* 12/06/2013 1713   BILITOT 0.24 01/05/2014 1124   BILITOT <0.2* 12/06/2013 1713   GFRNONAA 87* 12/06/2013 1713   GFRAA >90 12/06/2013 1713    No results found for this basename: SPEP,  UPEP,   kappa and lambda light chains    Lab Results  Component Value Date   WBC 8.8 01/05/2014   NEUTROABS 7.8* 01/05/2014   HGB 9.5* 01/05/2014   HCT 32.4* 01/05/2014   MCV 80.2 01/05/2014   PLT 563* 01/05/2014      Chemistry      Component Value Date/Time   NA 137 01/05/2014 1124   NA 138 12/06/2013 1713   K 3.4* 01/05/2014 1124   K 2.9* 12/06/2013 1713   CL 97 12/06/2013 1713   CO2 24 01/05/2014 1124   CO2 30 12/06/2013 1713   BUN 6.1* 01/05/2014 1124   BUN 7 12/06/2013 1713   CREATININE 0.7 01/05/2014 1124   CREATININE 0.83 12/06/2013 1713      Component Value Date/Time   CALCIUM 9.9 01/05/2014 1124   CALCIUM 11.1* 12/06/2013 1713   ALKPHOS 228* 01/05/2014 1124   ALKPHOS 122* 12/06/2013 1713   AST 18 01/05/2014 1124   AST 24 12/06/2013 1713   ALT 8 01/05/2014 1124   ALT 12 12/06/2013 1713   BILITOT 0.24 01/05/2014 1124   BILITOT <0.2* 12/06/2013 1713       RADIOGRAPHIC STUDIES: A review of the CT scan of the abdomen from last month which showed no abnormalities in the left upper quadrant.  I have personally reviewed the radiological images as listed and agreed with the findings in the report.  ASSESSMENT & PLAN:  History of laryngeal cancer Clinically,  she has no signs of disease recurrence. However, I am concerned about the elevated alkaline phosphatase, unexplained weight loss and recent hypercalcemia. I recommend CT scan of the neck,  chest and bone scan for further evaluation and she agreed.  Rib pain on left side Cause is unknown. I will order further imaging study to exclude disease recurrence and bony metastasis. She was prescribed pain medicine by her PCP. I recommend further imaging studies for evaluation and she agreed.  Anemia, unspecified Further evaluation is pending. Preliminary results suggest iron deficiency anemia. I will see her back next week for further assessment and consideration for oral iron replacement therapy.  Unexplained weight loss Imaging study for assessment and to exclude disease recurrence.    Orders Placed This Encounter  Procedures  . CT Chest W Contrast    Standing Status: Future  Number of Occurrences:      Standing Expiration Date: 03/07/2015    Order Specific Question:  Reason for Exam (SYMPTOM  OR DIAGNOSIS REQUIRED)    Answer:  rib pain, laryngeal ca, hypercalcemia, exclude metastatic disease to lung    Order Specific Question:  Is the patient pregnant?    Answer:  No    Order Specific Question:  Preferred imaging location?    Answer:  [facility] Whole [facility]    Standing Status: Future     Number of Occurrences:      Standing Expiration Date: 03/07/2015    Order Specific Question:  Reason for Exam (SYMPTOM  OR DIAGNOSIS REQUIRED)    Answer:  rib pain, laryngeal ca, hypercalcemia, exclude metastatic disease to bone    Order Specific Question:  Is the patient pregnant?    Answer:  No    Order Specific Question:  Preferred imaging location?    Answer:  [facility]  . CT Soft Tissue Neck W Contrast    Standing Status: Future     Number of Occurrences:      Standing Expiration Date: 04/07/2015    Order Specific Question:  Reason for Exam (SYMPTOM  OR  DIAGNOSIS REQUIRED)    Answer:  laryngeal cancer, exclude recurrence    Order Specific Question:  Is the patient pregnant?    Answer:  No    Order Specific Question:  Preferred imaging location?    Answer:  [facility]   All questions were answered. The patient knows to call the clinic with any problems, questions or concerns. No barriers to learning was detected. I spent 30 minutes counseling the patient face to face. The total time spent in the appointment was 40 minutes and more than 50% was on counseling and review of test results     [facility], [place], MD 01/05/2014 2:00 PM

## 2014-01-05 NOTE — Telephone Encounter (Signed)
Pt confirmed ov per 08/26 POF, gave pt AVS....KJ

## 2014-01-05 NOTE — Assessment & Plan Note (Signed)
Further evaluation is pending. Preliminary results suggest iron deficiency anemia. I will see her back next week for further assessment and consideration for oral iron replacement therapy.

## 2014-01-06 LAB — SEDIMENTATION RATE: SED RATE: 64 mm/h — AB (ref 0–22)

## 2014-01-06 LAB — T4, FREE: FREE T4: 0.93 ng/dL (ref 0.80–1.80)

## 2014-01-06 LAB — VITAMIN B12: Vitamin B-12: 421 pg/mL (ref 211–911)

## 2014-01-10 ENCOUNTER — Telehealth: Payer: Self-pay | Admitting: *Deleted

## 2014-01-10 ENCOUNTER — Ambulatory Visit: Payer: Medicaid Other | Admitting: Hematology and Oncology

## 2014-01-10 NOTE — Telephone Encounter (Signed)
Left message to call.

## 2014-01-10 NOTE — Telephone Encounter (Signed)
Message copied by Patton Salles on Mon Jan 10, 2014  1:55 PM ------      Message from: [facility], [place]      Created: Mon Jan 10, 2014 12:24 PM      Regarding: missed appt       Her scans are to be done on 9/4. Can she come 9/9 at 11 to review tests? ------

## 2014-01-11 ENCOUNTER — Other Ambulatory Visit: Payer: Self-pay

## 2014-01-11 ENCOUNTER — Telehealth: Payer: Self-pay

## 2014-01-11 NOTE — Telephone Encounter (Signed)
Dr Alvy Bimler would like patient to come in 01/19/14 at 11am to review CT results for 01/14/14.  Patient aware of appt.  POF sent to scheduling.

## 2014-01-14 ENCOUNTER — Telehealth: Payer: Self-pay | Admitting: Hematology and Oncology

## 2014-01-14 ENCOUNTER — Other Ambulatory Visit: Payer: Self-pay | Admitting: *Deleted

## 2014-01-14 ENCOUNTER — Ambulatory Visit (HOSPITAL_BASED_OUTPATIENT_CLINIC_OR_DEPARTMENT_OTHER): Payer: Medicaid Other | Admitting: Hematology and Oncology

## 2014-01-14 ENCOUNTER — Encounter (HOSPITAL_COMMUNITY)
Admission: RE | Admit: 2014-01-14 | Discharge: 2014-01-14 | Disposition: A | Discharge status: Home / Self Care | Payer: Medicaid Other | Source: Ambulatory Visit | Attending: Hematology and Oncology | Admitting: Hematology and Oncology

## 2014-01-14 ENCOUNTER — Encounter: Payer: Self-pay | Admitting: Hematology and Oncology

## 2014-01-14 ENCOUNTER — Ambulatory Visit (HOSPITAL_COMMUNITY)
Admission: RE | Admit: 2014-01-14 | Discharge: 2014-01-14 | Disposition: A | Discharge status: Home / Self Care | Payer: Medicaid Other | Source: Ambulatory Visit | Attending: Hematology and Oncology | Admitting: Hematology and Oncology

## 2014-01-14 ENCOUNTER — Encounter (HOSPITAL_COMMUNITY): Payer: Self-pay

## 2014-01-14 ENCOUNTER — Telehealth: Payer: Self-pay | Admitting: *Deleted

## 2014-01-14 VITALS — BP 145/97 | HR 95 | Temp 98.6°F | Resp 18 | Ht 61.0 in | Wt 89.4 lb

## 2014-01-14 DIAGNOSIS — Z930 Tracheostomy status: Secondary | ICD-10-CM

## 2014-01-14 DIAGNOSIS — R0781 Pleurodynia: Secondary | ICD-10-CM

## 2014-01-14 DIAGNOSIS — D649 Anemia, unspecified: Secondary | ICD-10-CM

## 2014-01-14 DIAGNOSIS — J189 Pneumonia, unspecified organism: Secondary | ICD-10-CM

## 2014-01-14 DIAGNOSIS — Z8521 Personal history of malignant neoplasm of larynx: Secondary | ICD-10-CM | POA: Insufficient documentation

## 2014-01-14 DIAGNOSIS — R079 Chest pain, unspecified: Secondary | ICD-10-CM | POA: Insufficient documentation

## 2014-01-14 DIAGNOSIS — J181 Lobar pneumonia, unspecified organism: Principal | ICD-10-CM

## 2014-01-14 HISTORY — DX: Pneumonia, unspecified organism: J18.9

## 2014-01-14 HISTORY — DX: Pleurodynia: R07.81

## 2014-01-14 MED ORDER — PREDNISONE 20 MG PO TABS: 40.0000 mg | ORAL_TABLET | Freq: Every day | ORAL | Status: DC

## 2014-01-14 MED ORDER — TECHNETIUM TC 99M MEDRONATE IV KIT
26.1000 | PACK | Freq: Once | INTRAVENOUS | Status: AC | PRN
  Administered 2014-01-14: 26.1 via INTRAVENOUS

## 2014-01-14 MED ORDER — AMOXICILLIN-POT CLAVULANATE 875-125 MG PO TABS: 1.0000 | ORAL_TABLET | Freq: Two times a day (BID) | ORAL | Status: DC

## 2014-01-14 MED ORDER — IOHEXOL 300 MG/ML  SOLN
100.0000 mL | Freq: Once | INTRAMUSCULAR | Status: AC | PRN
  Administered 2014-01-14: 100 mL via INTRAVENOUS

## 2014-01-14 MED ORDER — HYDROMORPHONE HCL 2 MG PO TABS: 2.0000 mg | ORAL_TABLET | ORAL | Status: DC | PRN

## 2014-01-14 NOTE — Telephone Encounter (Signed)
THE RESULTS WERE CALLED AND FAXED. THE REPORT WAS GIVEN TO [place].

## 2014-01-14 NOTE — Telephone Encounter (Signed)
added appt for today per pof...pt aware

## 2014-01-14 NOTE — Telephone Encounter (Signed)
Message copied by Patton Salles on Fri Jan 14, 2014  9:10 AM ------      Message from: [facility], [place]      Created: Fri Jan 14, 2014  9:05 AM      Regarding: urgent evaluation today at 12 pm       Can you see if she can be here at 12 pm? I have a cancellation. Check in at 1130 am if she can      ----- Message -----         From: Rad Results In Interface         Sent: 01/14/2014   8:33 AM           To: Heath Lark, MD                   ------

## 2014-01-14 NOTE — Telephone Encounter (Signed)
Pt will be here today at 11:30

## 2014-01-14 NOTE — Telephone Encounter (Signed)
gv adn printed appt sched and avs for pt for Sept.... °

## 2014-01-15 NOTE — Assessment & Plan Note (Addendum)
Bone scan showed no evidence of metastatic disease. It could be due to the pleuritic pain from pneumonia I prescribed a stronger pain medication for her. We discussed the narcotic refill policy.

## 2014-01-15 NOTE — Progress Notes (Signed)
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 According to the patient, she presented with sensation of sore throat. She was treated with chemotherapy weekly along with radiation therapy. The patient had placement of tracheostomy. She had unexplained weight loss in August 2015. The CT scan showed possible pneumonia  INTERVAL HISTORY: Please see below for problem oriented charting. She continues to have severe rib pain. Denies fevers or chills.  REVIEW OF SYSTEMS:   Eyes: Denies blurriness of vision Ears, nose, mouth, throat, and face: Denies mucositis or sore throat Respiratory: Denies cough, dyspnea or wheezes Cardiovascular: Denies palpitation, chest discomfort or lower extremity swelling Gastrointestinal:  Denies nausea, heartburn or change in bowel habits Skin: Denies abnormal skin rashes Lymphatics: Denies new lymphadenopathy or easy bruising Neurological:Denies numbness, tingling or new weaknesses Behavioral/Psych: Mood is stable, no new changes  All other systems were reviewed with the patient and are negative.  I have reviewed the past medical history, past surgical history, social history and family history with the patient and they are unchanged from previous note.  ALLERGIES:  has No Known Allergies.  MEDICATIONS:  Current Outpatient Prescriptions  Medication Sig Dispense Refill  . acetaminophen (TYLENOL) 500 MG tablet Take 1 tablet (500 mg total) by mouth every 4 (four) hours as needed for pain.  30 tablet  0  . HYDROcodone-acetaminophen (NORCO/VICODIN) 5-325 MG per tablet Take 1-2 tablets by mouth every 4 (four) hours as needed.  16  tablet  0  . potassium chloride (K-DUR) 10 MEQ tablet Take 1 tablet (10 mEq total) by mouth daily.  7 tablet  0  . topiramate (TOPAMAX) 50 MG tablet Take 50 mg by mouth 2 (two) times daily.      . traZODone (DESYREL) 50 MG tablet Take 50 mg by mouth at bedtime.      Marland Kitchen amoxicillin-clavulanate (AUGMENTIN) 875-125 MG per tablet Take 1 tablet by mouth 2 (two) times daily.  20 tablet  0  . HYDROmorphone (DILAUDID) 2 MG tablet Take 1 tablet (2 mg total) by mouth every 4 (four) hours as needed for severe pain.  60 tablet  0  . predniSONE (DELTASONE) 20 MG tablet Take 2 tablets (40 mg total) by mouth daily with breakfast.  10 tablet  0   No current facility-administered medications for this visit.    PHYSICAL EXAMINATION: ECOG PERFORMANCE STATUS: 1 - Symptomatic but completely ambulatory  Filed Vitals:   01/14/14 1116  BP: 145/97  Pulse: 95  Temp: 98.6 F (37 C)  Resp: 18   Filed Weights   01/14/14 1116  Weight: 89 lb 6.4 oz (40.552 kg)    GENERAL:alert, no distress and comfortable she looks thin and cachectic SKIN: skin color, texture, turgor are normal, no rashes or significant lesions EYES: normal, Conjunctiva are pink and non-injected, sclera clear OROPHARYNX:no exudate, no erythema and lips, buccal mucosa, and tongue normal . Poor dentition is noted NECK: Truck is some in situ. No signs of infection.  NEURO: alert & oriented x 3 with fluent speech, no focal motor/sensory deficits  LABORATORY DATA:  I have reviewed the data as listed    Component Value Date/Time   NA 137 01/05/2014 1124   NA 138  12/06/2013 1713   K 3.4* 01/05/2014 1124   K 2.9* 12/06/2013 1713   CL 97 12/06/2013 1713   CO2 24 01/05/2014 1124   CO2 30 12/06/2013 1713   GLUCOSE 91 01/05/2014 1124   GLUCOSE 113* 12/06/2013 1713   BUN 6.1* 01/05/2014 1124   BUN 7 12/06/2013 1713   CREATININE 0.7 01/05/2014 1124   CREATININE 0.83 12/06/2013 1713   CALCIUM 9.9 01/05/2014 1124   CALCIUM 11.1* 12/06/2013 1713   PROT 7.3  01/05/2014 1124   PROT 6.9 12/06/2013 1713   ALBUMIN 3.0* 01/05/2014 1124   ALBUMIN 3.6 12/06/2013 1713   AST 18 01/05/2014 1124   AST 24 12/06/2013 1713   ALT 8 01/05/2014 1124   ALT 12 12/06/2013 1713   ALKPHOS 228* 01/05/2014 1124   ALKPHOS 122* 12/06/2013 1713   BILITOT 0.24 01/05/2014 1124   BILITOT <0.2* 12/06/2013 1713   GFRNONAA 87* 12/06/2013 1713   GFRAA >90 12/06/2013 1713    No results found for this basename: SPEP,  UPEP,   kappa and lambda light chains    Lab Results  Component Value Date   WBC 8.8 01/05/2014   NEUTROABS 7.8* 01/05/2014   HGB 9.5* 01/05/2014   HCT 32.4* 01/05/2014   MCV 80.2 01/05/2014   PLT 563* 01/05/2014      Chemistry      Component Value Date/Time   NA 137 01/05/2014 1124   NA 138 12/06/2013 1713   K 3.4* 01/05/2014 1124   K 2.9* 12/06/2013 1713   CL 97 12/06/2013 1713   CO2 24 01/05/2014 1124   CO2 30 12/06/2013 1713   BUN 6.1* 01/05/2014 1124   BUN 7 12/06/2013 1713   CREATININE 0.7 01/05/2014 1124   CREATININE 0.83 12/06/2013 1713      Component Value Date/Time   CALCIUM 9.9 01/05/2014 1124   CALCIUM 11.1* 12/06/2013 1713   ALKPHOS 228* 01/05/2014 1124   ALKPHOS 122* 12/06/2013 1713   AST 18 01/05/2014 1124   AST 24 12/06/2013 1713   ALT 8 01/05/2014 1124   ALT 12 12/06/2013 1713   BILITOT 0.24 01/05/2014 1124   BILITOT <0.2* 12/06/2013 1713       RADIOGRAPHIC STUDIES: I have personally reviewed the radiological images as listed and agreed with the findings in the report. Ct Soft Tissue Neck W Contrast  01/14/2014   CLINICAL DATA:  History of laryngeal carcinoma diagnosed 2 years ago.  EXAM: CT OF THE NECK WITH CONTRAST  CT OF THE CHEST WITH CONTRAST  TECHNIQUE: Multidetector CT imaging of the neck was performed with intravenous contrast.; Multidetector CT imaging of the chest was performed following the standard protocol during bolus administration of intravenous contrast.  CONTRAST:  159mL OMNIPAQUE IOHEXOL 300 MG/ML  SOLN  COMPARISON:  None.  FINDINGS: CT  NECK FINDINGS  The visualized intracranial contents are unremarkable. Symmetric appearance of the parotid and submandibular glands. No enlarged cervical lymph nodes. Epiglottis deformity and focal laryngeal enhancement noted at the posterior commisure. No submucosal or nodal disease. The patient has a tracheostomy tube.  CT CHEST FINDINGS  Multiple calcified granulomas are identified in both lungs. There is airspace consolidation involving the stress set masslike consolidation within the right lower lobe is identified measuring 4.2 by 3.9 by 2.4 cm. Central areas of fluid attenuation compatible with necrosis. Surrounding pneumonitis is identified.  The heart size appears normal. No pericardial effusion. No enlarged mediastinal or hilar lymph nodes identified. There is no axillary or supraclavicular adenopathy. Incidental imaging  through the upper abdomen is unremarkable.  Review of the visualized bony structures is negative for aggressive lytic or sclerotic bone lesions.  IMPRESSION:  1. Deformity of the epiglottis and focal laryngeal enhancement is noted at the posterior commissure. This is favored to represent a post treatment changes. However, recommend correlation with direct visualization. No evidence for submucosal or nodal disease. 2. Necrotic, masslike consolidation within the right lower lobe. Differential considerations include tumor, sequelae of aspiration or necrotizing pneumonia. Recommend further assessment with bronchoscopy. These results were called by telephone at the time of interpretation on 01/14/2014 at 8:30 am to Dr. Heath Lark , who verbally acknowledged these results.   Electronically Signed   By: Kerby Moors M.D.   On: 01/14/2014 08:30   Ct Chest W Contrast  01/14/2014   CLINICAL DATA:  History of laryngeal carcinoma diagnosed 2 years ago.  EXAM: CT OF THE NECK WITH CONTRAST  CT OF THE CHEST WITH CONTRAST  TECHNIQUE: Multidetector CT imaging of the neck was performed with intravenous  contrast.; Multidetector CT imaging of the chest was performed following the standard protocol during bolus administration of intravenous contrast.  CONTRAST:  155mL OMNIPAQUE IOHEXOL 300 MG/ML  SOLN  COMPARISON:  None.  FINDINGS: CT NECK FINDINGS  The visualized intracranial contents are unremarkable. Symmetric appearance of the parotid and submandibular glands. No enlarged cervical lymph nodes. Epiglottis deformity and focal laryngeal enhancement noted at the posterior commisure. No submucosal or nodal disease. The patient has a tracheostomy tube.  CT CHEST FINDINGS  Multiple calcified granulomas are identified in both lungs. There is airspace consolidation involving the stress set masslike consolidation within the right lower lobe is identified measuring 4.2 by 3.9 by 2.4 cm. Central areas of fluid attenuation compatible with necrosis. Surrounding pneumonitis is identified.  The heart size appears normal. No pericardial effusion. No enlarged mediastinal or hilar lymph nodes identified. There is no axillary or supraclavicular adenopathy. Incidental imaging through the upper abdomen is unremarkable.  Review of the visualized bony structures is negative for aggressive lytic or sclerotic bone lesions.  IMPRESSION:  1. Deformity of the epiglottis and focal laryngeal enhancement is noted at the posterior commissure. This is favored to represent a post treatment changes. However, recommend correlation with direct visualization. No evidence for submucosal or nodal disease. 2. Necrotic, masslike consolidation within the right lower lobe. Differential considerations include tumor, sequelae of aspiration or necrotizing pneumonia. Recommend further assessment with bronchoscopy. These results were called by telephone at the time of interpretation on 01/14/2014 at 8:30 am to Dr. Heath Lark , who verbally acknowledged these results.   Electronically Signed   By: Kerby Moors M.D.   On: 01/14/2014 08:30   [facility] Bone Scan Whole  Body  01/14/2014   CLINICAL DATA:  38 year old female with hypercalcemia. Laryngeal cancer diagnosed 2 years ago status post chemotherapy and radiation. Left side rib and clavicle pain. Initial encounter.  EXAM: NUCLEAR MEDICINE WHOLE BODY BONE SCAN  TECHNIQUE: Whole body anterior and posterior images were obtained approximately 3 hours after intravenous injection of radiopharmaceutical.  RADIOPHARMACEUTICALS:  26.1 mCi Technetium-99 MDP  COMPARISON:  Chest CT from today reported separately. 12/06/2013 CT Abdomen and Pelvis  FINDINGS: Expected radiotracer activity in both kidneys and the bladder.  Scoliosis. Homogeneous radiotracer activity throughout the spine, ribs, and remaining axial skeleton.  Homogeneous radiotracer activity in the bilateral extremities.  No abnormal activity identified in the left ribs or clavicle.  IMPRESSION: Negative whole body bone scan.  See also neck and  chest CT findings from today reported separately.   Electronically Signed   By: Lars Pinks M.D.   On: 01/14/2014 10:50     ASSESSMENT & PLAN:  History of laryngeal cancer I am concerned about the abnormal appearance of the CT chest. Recurrence of malignancy cannot BE excluded. To give her the benefit of the doubt, I will proceed to treat her for possible pneumonia. I will reassess her situation next month with X-ray. If there is no major resolution of the symptoms, I will proceed with restaging PET/CT scan.  Tracheostomy in place Tracheostomy site looks okay without signs of infection.  Pneumonia The appearance of the CT scan could be due to pneumonia. I will proceed to give her a course of antibiotics and prednisone. If it does not resolve, I will refer her to a pulmonologist for further evaluation and management.  Rib pain Bone scan showed no evidence of metastatic disease. It could be due to the pleuritic pain from pneumonia I prescribed a stronger pain medication for her. We discussed the narcotic refill  policy.  Anemia, unspecified This is likely anemia of chronic disease. The patient denies recent history of bleeding such as epistaxis, hematuria or hematochezia. She is asymptomatic from the anemia. We will observe for now.  She does not require transfusion now.       Orders Placed This Encounter  Procedures  . DG Chest 2 View    Standing Status: Future     Number of Occurrences:      Standing Expiration Date: 02/18/2015    Order Specific Question:  Reason for exam:    Answer:  pneumonia    Order Specific Question:  Preferred imaging location?    Answer:  [facility]  . Comprehensive metabolic panel    Standing Status: Future     Number of Occurrences:      Standing Expiration Date: 02/18/2015  . CBC with Differential    Standing Status: Future     Number of Occurrences:      Standing Expiration Date: 02/18/2015   All questions were answered. The patient knows to call the clinic with any problems, questions or concerns. No barriers to learning was detected. I spent 30 minutes counseling the patient face to face. The total time spent in the appointment was 40 minutes and more than 50% was on counseling and review of test results     [facility], [place], MD 01/15/2014 4:07 PM

## 2014-01-15 NOTE — Assessment & Plan Note (Signed)
The appearance of the CT scan could be due to pneumonia. I will proceed to give her a course of antibiotics and prednisone. If it does not resolve, I will refer her to a pulmonologist for further evaluation and management.

## 2014-01-15 NOTE — Assessment & Plan Note (Signed)
Tracheostomy site looks okay without signs of infection.

## 2014-01-15 NOTE — Assessment & Plan Note (Signed)
I am concerned about the abnormal appearance of the CT chest. Recurrence of malignancy cannot BE excluded. To give her the benefit of the doubt, I will proceed to treat her for possible pneumonia. I will reassess her situation next month with X-ray. If there is no major resolution of the symptoms, I will proceed with restaging PET/CT scan.

## 2014-01-15 NOTE — Assessment & Plan Note (Signed)
This is likely anemia of chronic disease. The patient denies recent history of bleeding such as epistaxis, hematuria or hematochezia. She is asymptomatic from the anemia. We will observe for now.  She does not require transfusion now.   

## 2014-01-16 ENCOUNTER — Other Ambulatory Visit: Payer: Self-pay | Admitting: Hematology and Oncology

## 2014-01-19 ENCOUNTER — Ambulatory Visit: Payer: Medicaid Other | Admitting: Hematology and Oncology

## 2014-01-26 ENCOUNTER — Encounter: Payer: Self-pay | Admitting: Hematology and Oncology

## 2014-01-26 ENCOUNTER — Ambulatory Visit (HOSPITAL_BASED_OUTPATIENT_CLINIC_OR_DEPARTMENT_OTHER): Payer: Medicaid Other

## 2014-01-26 ENCOUNTER — Telehealth: Payer: Self-pay | Admitting: Hematology and Oncology

## 2014-01-26 ENCOUNTER — Ambulatory Visit (HOSPITAL_BASED_OUTPATIENT_CLINIC_OR_DEPARTMENT_OTHER): Payer: Medicaid Other | Admitting: Hematology and Oncology

## 2014-01-26 ENCOUNTER — Ambulatory Visit (HOSPITAL_COMMUNITY)
Admission: RE | Admit: 2014-01-26 | Discharge: 2014-01-26 | Disposition: A | Discharge status: Home / Self Care | Payer: Medicaid Other | Source: Ambulatory Visit | Attending: Hematology and Oncology | Admitting: Hematology and Oncology

## 2014-01-26 ENCOUNTER — Other Ambulatory Visit: Payer: Self-pay | Admitting: *Deleted

## 2014-01-26 VITALS — BP 134/94 | HR 81 | Temp 97.8°F | Resp 17 | Ht 61.0 in | Wt 90.7 lb

## 2014-01-26 DIAGNOSIS — Z8521 Personal history of malignant neoplasm of larynx: Secondary | ICD-10-CM | POA: Diagnosis not present

## 2014-01-26 DIAGNOSIS — E871 Hypo-osmolality and hyponatremia: Secondary | ICD-10-CM

## 2014-01-26 DIAGNOSIS — R079 Chest pain, unspecified: Secondary | ICD-10-CM | POA: Insufficient documentation

## 2014-01-26 DIAGNOSIS — J189 Pneumonia, unspecified organism: Secondary | ICD-10-CM

## 2014-01-26 DIAGNOSIS — D649 Anemia, unspecified: Secondary | ICD-10-CM

## 2014-01-26 DIAGNOSIS — R0781 Pleurodynia: Secondary | ICD-10-CM

## 2014-01-26 DIAGNOSIS — J181 Lobar pneumonia, unspecified organism: Principal | ICD-10-CM

## 2014-01-26 LAB — COMPREHENSIVE METABOLIC PANEL (CC13)
ALBUMIN: 3.1 g/dL — AB (ref 3.5–5.0)
ALT: <6 U/L (ref 0–55)
AST: 24 U/L (ref 5–34)
Alkaline Phosphatase: 208 U/L — ABNORMAL HIGH (ref 40–150)
Anion Gap: 10 mEq/L (ref 3–11)
BILIRUBIN TOTAL: 0.32 mg/dL (ref 0.20–1.20)
BUN: 8.3 mg/dL (ref 7.0–26.0)
CO2: 21 mEq/L — ABNORMAL LOW (ref 22–29)
Calcium: 9.1 mg/dL (ref 8.4–10.4)
Chloride: 104 mEq/L (ref 98–109)
Creatinine: 0.7 mg/dL (ref 0.6–1.1)
Glucose: 89 mg/dl (ref 70–140)
POTASSIUM: 3.3 meq/L — AB (ref 3.5–5.1)
Sodium: 135 mEq/L — ABNORMAL LOW (ref 136–145)
TOTAL PROTEIN: 7 g/dL (ref 6.4–8.3)

## 2014-01-26 LAB — CBC WITH DIFFERENTIAL/PLATELET
BASO%: 0.5 % (ref 0.0–2.0)
Basophils Absolute: 0 10*3/uL (ref 0.0–0.1)
EOS ABS: 0.1 10*3/uL (ref 0.0–0.5)
EOS%: 2.2 % (ref 0.0–7.0)
HCT: 32.6 % — ABNORMAL LOW (ref 34.8–46.6)
HGB: 9.3 g/dL — ABNORMAL LOW (ref 11.6–15.9)
LYMPH%: 6.4 % — AB (ref 14.0–49.7)
MCH: 23.3 pg — ABNORMAL LOW (ref 25.1–34.0)
MCHC: 28.5 g/dL — AB (ref 31.5–36.0)
MCV: 81.7 fL (ref 79.5–101.0)
MONO#: 0.5 10*3/uL (ref 0.1–0.9)
MONO%: 8.2 % (ref 0.0–14.0)
NEUT%: 82.7 % — ABNORMAL HIGH (ref 38.4–76.8)
NEUTROS ABS: 5.2 10*3/uL (ref 1.5–6.5)
Platelets: 346 10*3/uL (ref 145–400)
RBC: 3.99 10*6/uL (ref 3.70–5.45)
RDW: 20.4 % — ABNORMAL HIGH (ref 11.2–14.5)
WBC: 6.3 10*3/uL (ref 3.9–10.3)
lymph#: 0.4 10*3/uL — ABNORMAL LOW (ref 0.9–3.3)

## 2014-01-26 NOTE — Assessment & Plan Note (Signed)
She had responded well to recent course of prednisone and antibiotics treatment. I recommend observation and see her back next month with repeat CT scan of the chest to ensure resolution of the abnormalities seen recently.

## 2014-01-26 NOTE — Assessment & Plan Note (Signed)
I felt that her rib pain could be related to pneumonia. She will continue taking pain medicine as needed.

## 2014-01-26 NOTE — Assessment & Plan Note (Signed)
This is not an unexpected side effects from recent pneumonia. She is not symptomatic. I recommend observation only.

## 2014-01-26 NOTE — Telephone Encounter (Signed)
gv and printed appt sched and avs for pt for OCT. °

## 2014-01-26 NOTE — Assessment & Plan Note (Signed)
This is likely anemia of chronic disease. The patient denies recent history of bleeding such as epistaxis, hematuria or hematochezia. She is asymptomatic from the anemia. We will observe for now.  She does not require transfusion now.   

## 2014-01-26 NOTE — Progress Notes (Signed)
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 According to the patient, she presented with sensation of sore throat. She was treated with chemotherapy weekly along with radiation therapy. The patient had placement of tracheostomy. She had unexplained weight loss in August 2015. The CT scan showed possible pneumonia   INTERVAL HISTORY: Please see below for problem oriented charting. Her rib pain has improved. She denies recent cough, fevers or chills.  REVIEW OF SYSTEMS:   Constitutional: Denies fevers, chills or abnormal weight loss Eyes: Denies blurriness of vision Ears, nose, mouth, throat, and face: Denies mucositis or sore throat Respiratory: Denies cough, dyspnea or wheezes Cardiovascular: Denies palpitation, chest discomfort or lower extremity swelling Gastrointestinal:  Denies nausea, heartburn or change in bowel habits Skin: Denies abnormal skin rashes Lymphatics: Denies new lymphadenopathy or easy bruising Neurological:Denies numbness, tingling or new weaknesses Behavioral/Psych: Mood is stable, no new changes  All other systems were reviewed with the patient and are negative.  I have reviewed the past medical history, past surgical history, social history and family history with the patient and they are unchanged from previous note.  ALLERGIES:  has No Known Allergies.  MEDICATIONS:  Current Outpatient Prescriptions  Medication Sig Dispense Refill  . acetaminophen (TYLENOL) 500 MG tablet Take 1 tablet (500 mg total) by mouth every 4 (four) hours as needed for pain.  30 tablet  0  . amoxicillin-clavulanate (AUGMENTIN) 875-125 MG per  tablet Take 1 tablet by mouth 2 (two) times daily.  20 tablet  0  . HYDROcodone-acetaminophen (NORCO/VICODIN) 5-325 MG per tablet Take 1-2 tablets by mouth every 4 (four) hours as needed.  16 tablet  0  . HYDROmorphone (DILAUDID) 2 MG tablet Take 1 tablet (2 mg total) by mouth every 4 (four) hours as needed for severe pain.  60 tablet  0  . potassium chloride (K-DUR) 10 MEQ tablet Take 1 tablet (10 mEq total) by mouth daily.  7 tablet  0  . predniSONE (DELTASONE) 20 MG tablet Take 2 tablets (40 mg total) by mouth daily with breakfast.  10 tablet  0  . topiramate (TOPAMAX) 50 MG tablet Take 50 mg by mouth 2 (two) times daily.      . traZODone (DESYREL) 50 MG tablet Take 50 mg by mouth at bedtime.       No current facility-administered medications for this visit.    PHYSICAL EXAMINATION: ECOG PERFORMANCE STATUS: 1 - Symptomatic but completely ambulatory  Filed Vitals:   01/26/14 1128  BP: 134/94  Pulse: 81  Temp: 97.8 F (36.6 C)  Resp: 17   Filed Weights   01/26/14 1128  Weight: 90 lb 11.2 oz (41.141 kg)    GENERAL:alert, no distress and comfortable SKIN: skin color, texture, turgor are normal, no rashes or significant lesions EYES: normal, Conjunctiva are pink and non-injected, sclera clear OROPHARYNX:no exudate, no erythema and lips, buccal mucosa, and tongue normal  NECK: Tracheostomy e in situ. LYMPH:  no palpable lymphadenopathy in the cervical, axillary or inguinal LUNGS: clear to auscultation and percussion with normal breathing effort HEART: regular rate & rhythm and no murmurs and no lower extremity edema ABDOMEN:abdomen soft, non-tender and normal bowel sounds Musculoskeletal:no cyanosis of digits  and no clubbing  NEURO: alert & oriented x 3 with fluent speech, no focal motor/sensory deficits  LABORATORY DATA:  I have reviewed the data as listed    Component Value Date/Time   NA 135* 01/26/2014 1040   NA 138 12/06/2013 1713   K 3.3* 01/26/2014 1040   K 2.9*  12/06/2013 1713   CL 97 12/06/2013 1713   CO2 21* 01/26/2014 1040   CO2 30 12/06/2013 1713   GLUCOSE 89 01/26/2014 1040   GLUCOSE 113* 12/06/2013 1713   BUN 8.3 01/26/2014 1040   BUN 7 12/06/2013 1713   CREATININE 0.7 01/26/2014 1040   CREATININE 0.83 12/06/2013 1713   CALCIUM 9.1 01/26/2014 1040   CALCIUM 11.1* 12/06/2013 1713   PROT 7.0 01/26/2014 1040   PROT 6.9 12/06/2013 1713   ALBUMIN 3.1* 01/26/2014 1040   ALBUMIN 3.6 12/06/2013 1713   AST 24 01/26/2014 1040   AST 24 12/06/2013 1713   ALT <6 01/26/2014 1040   ALT 12 12/06/2013 1713   ALKPHOS 208* 01/26/2014 1040   ALKPHOS 122* 12/06/2013 1713   BILITOT 0.32 01/26/2014 1040   BILITOT <0.2* 12/06/2013 1713   GFRNONAA 87* 12/06/2013 1713   GFRAA >90 12/06/2013 1713    No results found for this basename: SPEP,  UPEP,   kappa and lambda light chains    Lab Results  Component Value Date   WBC 6.3 01/26/2014   NEUTROABS 5.2 01/26/2014   HGB 9.3* 01/26/2014   HCT 32.6* 01/26/2014   MCV 81.7 01/26/2014   PLT 346 01/26/2014      Chemistry      Component Value Date/Time   NA 135* 01/26/2014 1040   NA 138 12/06/2013 1713   K 3.3* 01/26/2014 1040   K 2.9* 12/06/2013 1713   CL 97 12/06/2013 1713   CO2 21* 01/26/2014 1040   CO2 30 12/06/2013 1713   BUN 8.3 01/26/2014 1040   BUN 7 12/06/2013 1713   CREATININE 0.7 01/26/2014 1040   CREATININE 0.83 12/06/2013 1713      Component Value Date/Time   CALCIUM 9.1 01/26/2014 1040   CALCIUM 11.1* 12/06/2013 1713   ALKPHOS 208* 01/26/2014 1040   ALKPHOS 122* 12/06/2013 1713   AST 24 01/26/2014 1040   AST 24 12/06/2013 1713   ALT <6 01/26/2014 1040   ALT 12 12/06/2013 1713   BILITOT 0.32 01/26/2014 1040   BILITOT <0.2* 12/06/2013 1713       RADIOGRAPHIC STUDIES: I have personally reviewed the radiological images as listed and agreed with the findings in the report. Dg Chest 2 View  01/26/2014   CLINICAL DATA:  Chest pain, history of laryngeal carcinoma  EXAM: CHEST  2 VIEW  COMPARISON:  Chest x-ray of 02/02/2013  and CT chest of 01/14/2014  FINDINGS: There does appear to have been some improvement in the right middle lobe opacity noted on recent CT chest. The opacity has decreased in size but continued followup is recommended to ensure clearing. Calcified granulomas are noted bilaterally. No effusion is seen. Tracheostomy appears to be in good position and the heart is within normal limits in size. Thoracolumbar scoliosis is noted.  IMPRESSION: Some improvement in the recently described right middle lobe opacity by CT chest most consistent with improving pneumonia. Recommend continued followup to ensure resolution   Electronically Signed   By: Ivar Drape M.D.   On: 01/26/2014 13:01     ASSESSMENT & PLAN:  Pneumonia She had responded well to recent course of prednisone  and antibiotics treatment. I recommend observation and see her back next month with repeat CT scan of the chest to ensure resolution of the abnormalities seen recently.  Hyponatremia This is not an unexpected side effects from recent pneumonia. She is not symptomatic. I recommend observation only.  Rib pain I felt that her rib pain could be related to pneumonia. She will continue taking pain medicine as needed.  Anemia, unspecified This is likely anemia of chronic disease. The patient denies recent history of bleeding such as epistaxis, hematuria or hematochezia. She is asymptomatic from the anemia. We will observe for now.  She does not require transfusion now.       Orders Placed This Encounter  Procedures  . CT Chest W Contrast    Standing Status: Future     Number of Occurrences:      Standing Expiration Date: 03/28/2015    Order Specific Question:  Reason for Exam (SYMPTOM  OR DIAGNOSIS REQUIRED)    Answer:  laryngeal cancer, lung mass    Order Specific Question:  Is the patient pregnant?    Answer:  No    Order Specific Question:  Preferred imaging location?    Answer:  [facility]  . CBC with Differential     Standing Status: Future     Number of Occurrences:      Standing Expiration Date: 03/02/2015  . Comprehensive metabolic panel    Standing Status: Future     Number of Occurrences:      Standing Expiration Date: 03/02/2015   All questions were answered. The patient knows to call the clinic with any problems, questions or concerns. No barriers to learning was detected. I spent 25 minutes counseling the patient face to face. The total time spent in the appointment was 30 minutes and more than 50% was on counseling and review of test results     [facility], [place], MD 01/26/2014 7:38 PM

## 2014-02-28 ENCOUNTER — Ambulatory Visit (HOSPITAL_COMMUNITY): Payer: Medicaid Other

## 2014-02-28 ENCOUNTER — Ambulatory Visit: Payer: Medicaid Other | Admitting: Hematology and Oncology

## 2014-02-28 ENCOUNTER — Other Ambulatory Visit: Payer: Medicaid Other

## 2014-03-07 ENCOUNTER — Other Ambulatory Visit: Payer: Self-pay | Admitting: *Deleted

## 2014-03-08 ENCOUNTER — Other Ambulatory Visit: Payer: Self-pay | Admitting: *Deleted

## 2014-03-08 ENCOUNTER — Telehealth: Payer: Self-pay | Admitting: Hematology and Oncology

## 2014-03-08 NOTE — Telephone Encounter (Signed)
PER STAFF MESSAGE AND 10/27 POF ADD LAB TO CT TOMORROW. ADDED LAB AND S/W PT RE APPT. PER PT DUE TO INS ISSUES SHE WANTS TO MOVE APPTS TO NOV. PT WILL CALL TO R/S CT AND CALL us BACK TO R/S LAB. MESSAGE TO PROVIDER/DESK NURSE.

## 2014-03-09 ENCOUNTER — Ambulatory Visit (HOSPITAL_COMMUNITY): Payer: Medicaid Other

## 2014-03-09 ENCOUNTER — Other Ambulatory Visit: Payer: Medicaid Other

## 2014-03-21 ENCOUNTER — Ambulatory Visit (HOSPITAL_COMMUNITY): Admission: RE | Admit: 2014-03-21 | Payer: Medicaid Other | Source: Ambulatory Visit

## 2014-05-26 ENCOUNTER — Telehealth: Payer: Self-pay | Admitting: Hematology and Oncology

## 2014-05-26 NOTE — Telephone Encounter (Signed)
returned call and s.w. pt and r/s missed appt....pt ok and aware of appt

## 2014-06-28 ENCOUNTER — Other Ambulatory Visit (HOSPITAL_BASED_OUTPATIENT_CLINIC_OR_DEPARTMENT_OTHER): Payer: Medicaid Other

## 2014-06-28 ENCOUNTER — Ambulatory Visit (HOSPITAL_BASED_OUTPATIENT_CLINIC_OR_DEPARTMENT_OTHER): Payer: Medicaid Other | Admitting: Hematology and Oncology

## 2014-06-28 ENCOUNTER — Encounter: Payer: Self-pay | Admitting: Hematology and Oncology

## 2014-06-28 ENCOUNTER — Telehealth: Payer: Self-pay | Admitting: Hematology and Oncology

## 2014-06-28 VITALS — BP 113/71 | HR 79 | Temp 98.0°F | Resp 18 | Ht 61.0 in | Wt 106.0 lb

## 2014-06-28 DIAGNOSIS — D638 Anemia in other chronic diseases classified elsewhere: Secondary | ICD-10-CM

## 2014-06-28 DIAGNOSIS — Z8521 Personal history of malignant neoplasm of larynx: Secondary | ICD-10-CM

## 2014-06-28 DIAGNOSIS — J189 Pneumonia, unspecified organism: Secondary | ICD-10-CM

## 2014-06-28 DIAGNOSIS — D72819 Decreased white blood cell count, unspecified: Secondary | ICD-10-CM

## 2014-06-28 DIAGNOSIS — R918 Other nonspecific abnormal finding of lung field: Secondary | ICD-10-CM

## 2014-06-28 DIAGNOSIS — G43809 Other migraine, not intractable, without status migrainosus: Secondary | ICD-10-CM

## 2014-06-28 DIAGNOSIS — Z930 Tracheostomy status: Secondary | ICD-10-CM

## 2014-06-28 DIAGNOSIS — R911 Solitary pulmonary nodule: Secondary | ICD-10-CM

## 2014-06-28 DIAGNOSIS — C329 Malignant neoplasm of larynx, unspecified: Secondary | ICD-10-CM

## 2014-06-28 HISTORY — DX: Other nonspecific abnormal finding of lung field: R91.8

## 2014-06-28 LAB — CBC WITH DIFFERENTIAL/PLATELET
BASO%: 1.8 % (ref 0.0–2.0)
Basophils Absolute: 0.1 10*3/uL (ref 0.0–0.1)
EOS%: 2.4 % (ref 0.0–7.0)
Eosinophils Absolute: 0.1 10*3/uL (ref 0.0–0.5)
HCT: 31.6 % — ABNORMAL LOW (ref 34.8–46.6)
HGB: 9.1 g/dL — ABNORMAL LOW (ref 11.6–15.9)
LYMPH%: 16.9 % (ref 14.0–49.7)
MCH: 23.9 pg — AB (ref 25.1–34.0)
MCHC: 28.9 g/dL — AB (ref 31.5–36.0)
MCV: 82.7 fL (ref 79.5–101.0)
MONO#: 0.5 10*3/uL (ref 0.1–0.9)
MONO%: 13 % (ref 0.0–14.0)
NEUT#: 2.4 10*3/uL (ref 1.5–6.5)
NEUT%: 65.9 % (ref 38.4–76.8)
PLATELETS: 263 10*3/uL (ref 145–400)
RBC: 3.82 10*6/uL (ref 3.70–5.45)
RDW: 19.6 % — ABNORMAL HIGH (ref 11.2–14.5)
WBC: 3.6 10*3/uL — AB (ref 3.9–10.3)
lymph#: 0.6 10*3/uL — ABNORMAL LOW (ref 0.9–3.3)

## 2014-06-28 LAB — COMPREHENSIVE METABOLIC PANEL (CC13)
ALK PHOS: 131 U/L (ref 40–150)
ALT: 14 U/L (ref 0–55)
ANION GAP: 10 meq/L (ref 3–11)
AST: 28 U/L (ref 5–34)
Albumin: 3.6 g/dL (ref 3.5–5.0)
BUN: 8.3 mg/dL (ref 7.0–26.0)
CO2: 22 mEq/L (ref 22–29)
Calcium: 9.4 mg/dL (ref 8.4–10.4)
Chloride: 107 mEq/L (ref 98–109)
Creatinine: 0.8 mg/dL (ref 0.6–1.1)
Glucose: 116 mg/dl (ref 70–140)
Potassium: 4 mEq/L (ref 3.5–5.1)
SODIUM: 138 meq/L (ref 136–145)
TOTAL PROTEIN: 6.6 g/dL (ref 6.4–8.3)
Total Bilirubin: <0.2 mg/dL (ref 0.20–1.20)

## 2014-06-28 NOTE — Assessment & Plan Note (Signed)
She has poor sleep and intractable migraine headaches. I recommend good sleep hygiene and I would add CT scan of the head along with her other imaging study to be done in 2 months for further evaluation. In the meantime, recommend close follow-up with PCP.

## 2014-06-28 NOTE — Assessment & Plan Note (Signed)
This is likely related to recent viral illness. Will observe closely.

## 2014-06-28 NOTE — Assessment & Plan Note (Signed)
She had multiple long nodules and abnormal CT appearance in September. I recommend repeating the CT scan of the chest for further evaluation.

## 2014-06-28 NOTE — Assessment & Plan Note (Signed)
It looks fine without signs of infection. That was recently changed by ENT.

## 2014-06-28 NOTE — Assessment & Plan Note (Signed)
This is likely anemia of chronic disease. The patient denies recent history of bleeding such as epistaxis, hematuria or hematochezia. She is asymptomatic from the anemia. We will observe for now.  She does not require transfusion now.   

## 2014-06-28 NOTE — Progress Notes (Signed)
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 According to the patient, she presented with sensation of sore throat. She was treated with chemotherapy weekly along with radiation therapy. The patient had placement of tracheostomy. She had unexplained weight loss in August 2015. The CT scan showed possible pneumonia and she was treated with antibiotic therapy.  INTERVAL HISTORY: Please see below for problem oriented charting. She was supposed to return here for further evaluation but has not done so. Recently, she developed upper respiratory tract symptoms with nasal congestion and mild cough. She has gained some weight since the last time I saw her. Previous rib pain has resolved. She complained of intractable headaches recently and has poor sleep.  REVIEW OF SYSTEMS:   Constitutional: Denies fevers, chills or abnormal weight loss Eyes: Denies blurriness of vision Cardiovascular: Denies palpitation, chest discomfort or lower extremity swelling Gastrointestinal:  Denies nausea, heartburn or change in bowel habits Skin: Denies abnormal skin rashes Lymphatics: Denies new lymphadenopathy or easy bruising Neurological:Denies numbness, tingling or new weaknesses Behavioral/Psych: Mood is stable, no new changes  All other systems were reviewed with the patient and are negative.  I have reviewed the past medical history, past surgical history, social history and family history with the patient and they are unchanged from previous note.  ALLERGIES:  has No Known Allergies.  MEDICATIONS:  Current Outpatient Prescriptions  Medication Sig Dispense Refill  .  acetaminophen (TYLENOL) 500 MG tablet Take 1 tablet (500 mg total) by mouth every 4 (four) hours as needed for pain. 30 tablet 0  . topiramate (TOPAMAX) 50 MG tablet Take 50 mg by mouth 2 (two) times daily.    . traZODone (DESYREL) 50 MG tablet Take 50 mg by mouth at bedtime.     No current facility-administered medications for this visit.    PHYSICAL EXAMINATION: ECOG PERFORMANCE STATUS: 1 - Symptomatic but completely ambulatory  Filed Vitals:   06/28/14 0843  BP: 113/71  Pulse: 79  Temp: 98 F (36.7 C)  Resp: 18   Filed Weights   06/28/14 0843  Weight: 106 lb (48.081 kg)    GENERAL:alert, no distress and comfortable SKIN: skin color, texture, turgor are normal, no rashes or significant lesions EYES: normal, Conjunctiva are pink and non-injected, sclera clear OROPHARYNX:no exudate, no erythema and lips, buccal mucosa, and tongue normal  NECK: Tracheostomy in situ, no signs of infection. LYMPH:  no palpable lymphadenopathy in the cervical, axillary or inguinal LUNGS: clear to auscultation and percussion with normal breathing effort HEART: regular rate & rhythm and no murmurs and no lower extremity edema ABDOMEN:abdomen soft, non-tender and normal bowel sounds Musculoskeletal:no cyanosis of digits and no clubbing  NEURO: alert & oriented x 3 with fluent speech, no focal motor/sensory deficits  LABORATORY DATA:  I have reviewed the data as listed    Component Value Date/Time   NA 135* 01/26/2014 1040   NA 138 12/06/2013 1713   K 3.3* 01/26/2014 1040   K 2.9* 12/06/2013 1713   CL 97 12/06/2013 1713   CO2 21* 01/26/2014 1040   CO2 30 12/06/2013 1713   GLUCOSE 89 01/26/2014 1040   GLUCOSE 113* 12/06/2013 1713   BUN 8.3 01/26/2014  1040   BUN 7 12/06/2013 1713   CREATININE 0.7 01/26/2014 1040   CREATININE 0.83 12/06/2013 1713   CALCIUM 9.1 01/26/2014 1040   CALCIUM 11.1* 12/06/2013 1713   PROT 7.0 01/26/2014 1040   PROT 6.9 12/06/2013 1713   ALBUMIN 3.1* 01/26/2014  1040   ALBUMIN 3.6 12/06/2013 1713   AST 24 01/26/2014 1040   AST 24 12/06/2013 1713   ALT <6 01/26/2014 1040   ALT 12 12/06/2013 1713   ALKPHOS 208* 01/26/2014 1040   ALKPHOS 122* 12/06/2013 1713   BILITOT 0.32 01/26/2014 1040   BILITOT <0.2* 12/06/2013 1713   GFRNONAA 87* 12/06/2013 1713   GFRAA >90 12/06/2013 1713    No results found for: SPEP, UPEP  Lab Results  Component Value Date   WBC 3.6* 06/28/2014   NEUTROABS 2.4 06/28/2014   HGB 9.1* 06/28/2014   HCT 31.6* 06/28/2014   MCV 82.7 06/28/2014   PLT 263 06/28/2014      Chemistry      Component Value Date/Time   NA 135* 01/26/2014 1040   NA 138 12/06/2013 1713   K 3.3* 01/26/2014 1040   K 2.9* 12/06/2013 1713   CL 97 12/06/2013 1713   CO2 21* 01/26/2014 1040   CO2 30 12/06/2013 1713   BUN 8.3 01/26/2014 1040   BUN 7 12/06/2013 1713   CREATININE 0.7 01/26/2014 1040   CREATININE 0.83 12/06/2013 1713      Component Value Date/Time   CALCIUM 9.1 01/26/2014 1040   CALCIUM 11.1* 12/06/2013 1713   ALKPHOS 208* 01/26/2014 1040   ALKPHOS 122* 12/06/2013 1713   AST 24 01/26/2014 1040   AST 24 12/06/2013 1713   ALT <6 01/26/2014 1040   ALT 12 12/06/2013 1713   BILITOT 0.32 01/26/2014 1040   BILITOT <0.2* 12/06/2013 1713     ASSESSMENT & PLAN:  History of laryngeal cancer I am concerned about the last abnormal appearance of the CT chest. Recurrence of malignancy cannot BE excluded. A repeat chest x-ray in November 2015 was not suspicious for malignancy. Recommend repeating a CT scan in 2 months with contrast along with CT scan of the neck.   Multiple lung nodules She had multiple long nodules and abnormal CT appearance in September. I recommend repeating the CT scan of the chest for further evaluation.   Tracheostomy in place It looks fine without signs of infection. That was recently changed by ENT.   Other type of nonintractable migraine She has poor sleep and intractable migraine headaches. I  recommend good sleep hygiene and I would add CT scan of the head along with her other imaging study to be done in 2 months for further evaluation. In the meantime, recommend close follow-up with PCP.   Anemia in chronic illness This is likely anemia of chronic disease. The patient denies recent history of bleeding such as epistaxis, hematuria or hematochezia. She is asymptomatic from the anemia. We will observe for now.  She does not require transfusion now.     Leukopenia This is likely related to recent viral illness. Will observe closely.      Orders Placed This Encounter  Procedures  . CT Chest W Contrast    Standing Status: Future     Number of Occurrences:      Standing Expiration Date: 08/28/2015    Order Specific Question:  Reason for Exam (SYMPTOM  OR DIAGNOSIS REQUIRED)    Answer:  lung nodules, laryngeal ca, Hx pneumonia and abnromal CXR, exclude disease  Order Specific Question:  Is the patient pregnant?    Answer:  No    Order Specific Question:  Preferred imaging location?    Answer:  [facility]  . CT Soft Tissue Neck W Contrast    Standing Status: Future     Number of Occurrences:      Standing Expiration Date: 09/28/2015    Order Specific Question:  Reason for Exam (SYMPTOM  OR DIAGNOSIS REQUIRED)    Answer:  laryngeal ca, headache, exclude mets    Order Specific Question:  Is the patient pregnant?    Answer:  No    Order Specific Question:  Preferred imaging location?    Answer:  [facility]  . CT Head W Contrast    Standing Status: Future     Number of Occurrences:      Standing Expiration Date: 06/28/2015    Order Specific Question:  Reason for Exam (SYMPTOM  OR DIAGNOSIS REQUIRED)    Answer:  laryngeal ca, headache, exclude mets    Order Specific Question:  Is the patient pregnant?    Answer:  No    Order Specific Question:  Preferred imaging location?    Answer:  [facility]  . Comprehensive metabolic panel    Standing  Status: Future     Number of Occurrences:      Standing Expiration Date: 08/02/2015  . CBC with Differential/Platelet    Standing Status: Future     Number of Occurrences:      Standing Expiration Date: 08/02/2015  . Lactate dehydrogenase    Standing Status: Future     Number of Occurrences:      Standing Expiration Date: 08/02/2015  . TSH    Standing Status: Future     Number of Occurrences:      Standing Expiration Date: 08/02/2015  . T4, free    Standing Status: Future     Number of Occurrences:      Standing Expiration Date: 08/02/2015   All questions were answered. The patient knows to call the clinic with any problems, questions or concerns. No barriers to learning was detected. I spent 25 minutes counseling the patient face to face. The total time spent in the appointment was 30 minutes and more than 50% was on counseling and review of test results     [facility], [place], MD 06/28/2014 8:56 AM

## 2014-06-28 NOTE — Assessment & Plan Note (Signed)
I am concerned about the last abnormal appearance of the CT chest. Recurrence of malignancy cannot BE excluded. A repeat chest x-ray in November 2015 was not suspicious for malignancy. Recommend repeating a CT scan in 2 months with contrast along with CT scan of the neck.

## 2014-06-28 NOTE — Telephone Encounter (Signed)
Gave avs & calendar for April. °

## 2014-08-22 ENCOUNTER — Telehealth (HOSPITAL_COMMUNITY): Payer: Self-pay | Admitting: Radiology

## 2014-08-22 NOTE — Telephone Encounter (Signed)
S/w pt, told her only CT chest was approved by her insurance.  Vaughan Basta states dr is aware and will address it with the pt at her next office visit.  Pt will still arrive for labs and the ct chest only.

## 2014-08-26 ENCOUNTER — Ambulatory Visit (HOSPITAL_COMMUNITY)
Admission: RE | Admit: 2014-08-26 | Discharge: 2014-08-26 | Disposition: A | Discharge status: Home / Self Care | Payer: Medicaid Other | Source: Ambulatory Visit | Attending: Hematology and Oncology | Admitting: Hematology and Oncology

## 2014-08-26 ENCOUNTER — Other Ambulatory Visit (HOSPITAL_COMMUNITY)
Admission: RE | Admit: 2014-08-26 | Discharge: 2014-08-26 | Disposition: A | Discharge status: Home / Self Care | Payer: Medicaid Other | Source: Ambulatory Visit | Attending: Hematology and Oncology | Admitting: Hematology and Oncology

## 2014-08-26 ENCOUNTER — Telehealth: Payer: Self-pay | Admitting: *Deleted

## 2014-08-26 ENCOUNTER — Telehealth: Payer: Self-pay | Admitting: Hematology and Oncology

## 2014-08-26 ENCOUNTER — Ambulatory Visit (HOSPITAL_COMMUNITY): Payer: Medicaid Other

## 2014-08-26 ENCOUNTER — Encounter: Payer: Self-pay | Admitting: *Deleted

## 2014-08-26 ENCOUNTER — Other Ambulatory Visit: Payer: Self-pay | Admitting: Hematology and Oncology

## 2014-08-26 ENCOUNTER — Other Ambulatory Visit (HOSPITAL_BASED_OUTPATIENT_CLINIC_OR_DEPARTMENT_OTHER): Payer: Medicaid Other

## 2014-08-26 DIAGNOSIS — R918 Other nonspecific abnormal finding of lung field: Secondary | ICD-10-CM

## 2014-08-26 DIAGNOSIS — E876 Hypokalemia: Secondary | ICD-10-CM

## 2014-08-26 DIAGNOSIS — D638 Anemia in other chronic diseases classified elsewhere: Secondary | ICD-10-CM | POA: Diagnosis not present

## 2014-08-26 DIAGNOSIS — Z8521 Personal history of malignant neoplasm of larynx: Secondary | ICD-10-CM | POA: Insufficient documentation

## 2014-08-26 DIAGNOSIS — Z923 Personal history of irradiation: Secondary | ICD-10-CM | POA: Insufficient documentation

## 2014-08-26 LAB — CBC & DIFF AND RETIC
BASO%: 2.1 % — ABNORMAL HIGH (ref 0.0–2.0)
Basophils Absolute: 0.1 10*3/uL (ref 0.0–0.1)
EOS ABS: 0.1 10*3/uL (ref 0.0–0.5)
EOS%: 3 % (ref 0.0–7.0)
HCT: 32.8 % — ABNORMAL LOW (ref 34.8–46.6)
HGB: 9.6 g/dL — ABNORMAL LOW (ref 11.6–15.9)
Immature Retic Fract: 16.1 % — ABNORMAL HIGH (ref 1.60–10.00)
LYMPH%: 9.7 % — AB (ref 14.0–49.7)
MCH: 23.2 pg — ABNORMAL LOW (ref 25.1–34.0)
MCHC: 29.3 g/dL — ABNORMAL LOW (ref 31.5–36.0)
MCV: 79.2 fL — ABNORMAL LOW (ref 79.5–101.0)
MONO#: 0.4 10*3/uL (ref 0.1–0.9)
MONO%: 8.1 % (ref 0.0–14.0)
NEUT#: 3.4 10*3/uL (ref 1.5–6.5)
NEUT%: 77.1 % — ABNORMAL HIGH (ref 38.4–76.8)
PLATELETS: 385 10*3/uL (ref 145–400)
RBC: 4.14 10*6/uL (ref 3.70–5.45)
RDW: 17.2 % — AB (ref 11.2–14.5)
RETIC %: 0.95 % (ref 0.70–2.10)
Retic Ct Abs: 39.33 10*3/uL (ref 33.70–90.70)
WBC: 4.3 10*3/uL (ref 3.9–10.3)
lymph#: 0.4 10*3/uL — ABNORMAL LOW (ref 0.9–3.3)
nRBC: 0 % (ref 0–0)

## 2014-08-26 LAB — COMPREHENSIVE METABOLIC PANEL
ALBUMIN: 4.1 g/dL (ref 3.5–5.2)
ALK PHOS: 71 U/L (ref 39–117)
ALT: 10 U/L (ref 0–35)
AST: 22 U/L (ref 0–37)
Anion gap: 9 (ref 5–15)
BUN: 6 mg/dL (ref 6–23)
CHLORIDE: 105 mmol/L (ref 96–112)
CO2: 24 mmol/L (ref 19–32)
Calcium: 9.3 mg/dL (ref 8.4–10.5)
Creatinine, Ser: 0.78 mg/dL (ref 0.50–1.10)
GFR calc Af Amer: >90 mL/min (ref >90)
GFR calc non Af Amer: >90 mL/min (ref >90)
Glucose, Bld: 63 mg/dL — ABNORMAL LOW (ref 70–99)
POTASSIUM: 2.8 mmol/L — AB (ref 3.5–5.1)
Sodium: 138 mmol/L (ref 135–145)
Total Protein: 7.2 g/dL (ref 6.0–8.3)

## 2014-08-26 LAB — FERRITIN CHCC: FERRITIN: 9 ng/mL (ref 9–269)

## 2014-08-26 LAB — TSH CHCC: TSH: 4.947 m(IU)/L — ABNORMAL HIGH (ref 0.308–3.960)

## 2014-08-26 LAB — T4, FREE: FREE T4: 0.82 ng/dL (ref 0.80–1.80)

## 2014-08-26 LAB — LACTATE DEHYDROGENASE (CC13): LDH: 130 U/L (ref 125–245)

## 2014-08-26 LAB — SEDIMENTATION RATE: SED RATE: 7 mm/h (ref 0–20)

## 2014-08-26 MED ORDER — IOHEXOL 300 MG/ML  SOLN
80.0000 mL | Freq: Once | INTRAMUSCULAR | Status: AC | PRN
  Administered 2014-08-26: 80 mL via INTRAVENOUS

## 2014-08-26 MED ORDER — POTASSIUM CHLORIDE CRYS ER 20 MEQ PO TBCR: 20.0000 meq | EXTENDED_RELEASE_TABLET | Freq: Two times a day (BID) | ORAL | Status: DC

## 2014-08-26 NOTE — Telephone Encounter (Signed)
Pt returned call and I informed her of Dr. Calton Dach message below, new Rx for potassium twice daily sent to [facility].  Will recheck on next visit. She verbalized understanding.

## 2014-08-26 NOTE — Telephone Encounter (Signed)
Left VM for pt to call nurse back regarding lab results.

## 2014-08-26 NOTE — Telephone Encounter (Signed)
s.w. pt and advised on added lab on 4.20.Marland KitchenMarland KitchenMarland Kitchenpt ok and aware

## 2014-08-26 NOTE — Progress Notes (Unsigned)
Gave critical Potassium 2.8 to [place] 08/26/14 at [place], [place]

## 2014-08-26 NOTE — Telephone Encounter (Signed)
-----   Message from Heath Lark, MD sent at 08/26/2014 10:24 AM EDT ----- Regarding: low potassium Please call in KDur 20 meq BID PO X 7 days, 1 refill Recheck labs on return visit. Please add lab appt  I will order the lab

## 2014-08-31 ENCOUNTER — Telehealth: Payer: Self-pay | Admitting: Hematology and Oncology

## 2014-08-31 ENCOUNTER — Ambulatory Visit (HOSPITAL_BASED_OUTPATIENT_CLINIC_OR_DEPARTMENT_OTHER): Payer: Medicaid Other | Admitting: Hematology and Oncology

## 2014-08-31 ENCOUNTER — Other Ambulatory Visit (HOSPITAL_BASED_OUTPATIENT_CLINIC_OR_DEPARTMENT_OTHER): Payer: Medicaid Other

## 2014-08-31 ENCOUNTER — Encounter: Payer: Self-pay | Admitting: Hematology and Oncology

## 2014-08-31 VITALS — BP 142/98 | HR 79 | Temp 98.0°F | Resp 19 | Ht 61.0 in | Wt 97.3 lb

## 2014-08-31 DIAGNOSIS — E876 Hypokalemia: Secondary | ICD-10-CM | POA: Diagnosis not present

## 2014-08-31 DIAGNOSIS — D649 Anemia, unspecified: Secondary | ICD-10-CM | POA: Diagnosis not present

## 2014-08-31 DIAGNOSIS — E039 Hypothyroidism, unspecified: Secondary | ICD-10-CM

## 2014-08-31 DIAGNOSIS — Z930 Tracheostomy status: Secondary | ICD-10-CM

## 2014-08-31 DIAGNOSIS — Z8521 Personal history of malignant neoplasm of larynx: Secondary | ICD-10-CM | POA: Diagnosis not present

## 2014-08-31 DIAGNOSIS — R918 Other nonspecific abnormal finding of lung field: Secondary | ICD-10-CM

## 2014-08-31 DIAGNOSIS — D638 Anemia in other chronic diseases classified elsewhere: Secondary | ICD-10-CM

## 2014-08-31 LAB — POTASSIUM (CC13): POTASSIUM: 3.8 meq/L (ref 3.5–5.1)

## 2014-08-31 LAB — MAGNESIUM (CC13): Magnesium: 2.4 mg/dl (ref 1.5–2.5)

## 2014-08-31 NOTE — Telephone Encounter (Signed)
Gave avs & calendar for April 2017. °

## 2014-09-01 NOTE — Assessment & Plan Note (Signed)
Recent imaging showed no evidence of cancer. I will continue close follow-up with history, physical examination, blood work. Imaging study

## 2014-09-01 NOTE — Assessment & Plan Note (Signed)
This is likely anemia of chronic disease. The patient denies recent history of bleeding such as epistaxis, hematuria or hematochezia. She is asymptomatic from the anemia. We will observe for now.  She does not require transfusion now.   

## 2014-09-01 NOTE — Assessment & Plan Note (Signed)
This is chronic in nature, etiology unknown. It has resolved since oral potassium replacement. I recommend she eats potassium-rich diet.

## 2014-09-01 NOTE — Assessment & Plan Note (Signed)
It looks fine without signs of infection. That was recently changed by ENT.

## 2014-09-01 NOTE — Assessment & Plan Note (Signed)
She had multiple long nodules which appear stable. Continue periodic imaging study as indicated.

## 2014-09-01 NOTE — Progress Notes (Signed)
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 According to the patient, she presented with sensation of sore throat. She was treated with chemotherapy weekly along with radiation therapy. The patient had placement of tracheostomy. She had unexplained weight loss in August 2015. The CT scan showed possible pneumonia and she was treated with antibiotic therapy.   INTERVAL HISTORY: Please see below for problem oriented charting. She feels well. Denies recent infection. No recent cough. She has occasional abdominal pain, related to constipation.  REVIEW OF SYSTEMS:   Constitutional: Denies fevers, chills or abnormal weight loss Eyes: Denies blurriness of vision Ears, nose, mouth, throat, and face: Denies mucositis or sore throat Respiratory: Denies cough, dyspnea or wheezes Cardiovascular: Denies palpitation, chest discomfort or lower extremity swelling Gastrointestinal:  Denies nausea, heartburn or change in bowel habits Skin: Denies abnormal skin rashes Lymphatics: Denies new lymphadenopathy or easy bruising Neurological:Denies numbness, tingling or new weaknesses Behavioral/Psych: Mood is stable, no new changes  All other systems were reviewed with the patient and are negative.  I have reviewed the past medical history, past surgical history, social history and family history with the patient and they are unchanged from previous note.  ALLERGIES:  has No Known Allergies.  MEDICATIONS:  Current Outpatient Prescriptions  Medication Sig Dispense Refill  . acetaminophen (TYLENOL) 500 MG tablet Take 1 tablet (500 mg total) by mouth every 4 (four) hours  as needed for pain. 30 tablet 0  . potassium chloride SA (K-DUR,KLOR-CON) 20 MEQ tablet TAKE 1 TABLET(20 MEQ) BY MOUTH TWICE DAILY 180 tablet 1  . topiramate (TOPAMAX) 50 MG tablet Take 50 mg by mouth 2 (two) times daily.    . traZODone (DESYREL) 50 MG tablet Take 50 mg by mouth at bedtime.     No current facility-administered medications for this visit.    PHYSICAL EXAMINATION: ECOG PERFORMANCE STATUS: 0 - Asymptomatic  Filed Vitals:   08/31/14 0839  BP: 142/98  Pulse: 79  Temp: 98 F (36.7 C)  Resp: 19   Filed Weights   08/31/14 0839  Weight: 97 lb 4.8 oz (44.135 kg)    GENERAL:alert, no distress and comfortable SKIN: skin color, texture, turgor are normal, no rashes or significant lesions EYES: normal, Conjunctiva are pink and non-injected, sclera clear OROPHARYNX:no exudate, no erythema and lips, buccal mucosa, and tongue normal  NECK: Tracheostomy in situ.  LYMPH:  no palpable lymphadenopathy in the cervical, axillary or inguinal LUNGS: clear to auscultation and percussion with normal breathing effort HEART: regular rate & rhythm and no murmurs and no lower extremity edema ABDOMEN:abdomen soft, non-tender and normal bowel sounds Musculoskeletal:no cyanosis of digits and no clubbing  NEURO: alert & oriented x 3 with fluent speech, no focal motor/sensory deficits  LABORATORY DATA:  I have reviewed the data as listed    Component Value Date/Time   NA 138 08/26/2014 0939   NA 138 06/28/2014 0823   K 3.8 08/31/2014 0816   K 2.8* 08/26/2014 0939   CL 105 08/26/2014 0939   CO2 24 08/26/2014 0939   CO2 22 06/28/2014 0823   GLUCOSE 63* 08/26/2014 0939   GLUCOSE 116 06/28/2014 0823   BUN 6 08/26/2014 0939  BUN 8.3 06/28/2014 0823   CREATININE 0.78 08/26/2014 0939   CREATININE 0.8 06/28/2014 0823   CALCIUM 9.3 08/26/2014 0939   CALCIUM 9.4 06/28/2014 0823   PROT 7.2 08/26/2014 0939   PROT 6.6 06/28/2014 0823   ALBUMIN 4.1 08/26/2014 0939   ALBUMIN 3.6 06/28/2014  0823   AST 22 08/26/2014 0939   AST 28 06/28/2014 0823   ALT 10 08/26/2014 0939   ALT 14 06/28/2014 0823   ALKPHOS 71 08/26/2014 0939   ALKPHOS 131 06/28/2014 0823   BILITOT <0.1* 08/26/2014 0939   BILITOT <0.20 06/28/2014 0823   GFRNONAA >90 08/26/2014 0939   GFRAA >90 08/26/2014 0939    No results found for: SPEP, UPEP  Lab Results  Component Value Date   WBC 4.3 08/26/2014   NEUTROABS 3.4 08/26/2014   HGB 9.6* 08/26/2014   HCT 32.8* 08/26/2014   MCV 79.2* 08/26/2014   PLT 385 08/26/2014      Chemistry      Component Value Date/Time   NA 138 08/26/2014 0939   NA 138 06/28/2014 0823   K 3.8 08/31/2014 0816   K 2.8* 08/26/2014 0939   CL 105 08/26/2014 0939   CO2 24 08/26/2014 0939   CO2 22 06/28/2014 0823   BUN 6 08/26/2014 0939   BUN 8.3 06/28/2014 0823   CREATININE 0.78 08/26/2014 0939   CREATININE 0.8 06/28/2014 0823      Component Value Date/Time   CALCIUM 9.3 08/26/2014 0939   CALCIUM 9.4 06/28/2014 0823   ALKPHOS 71 08/26/2014 0939   ALKPHOS 131 06/28/2014 0823   AST 22 08/26/2014 0939   AST 28 06/28/2014 0823   ALT 10 08/26/2014 0939   ALT 14 06/28/2014 0823   BILITOT <0.1* 08/26/2014 0939   BILITOT <0.20 06/28/2014 9357       RADIOGRAPHIC STUDIES: I reviewed the imaging study with the patient. I have personally reviewed the radiological images as listed and agreed with the findings in the report.  ASSESSMENT & PLAN:  History of laryngeal cancer Recent imaging showed no evidence of cancer. I will continue close follow-up with history, physical examination, blood work. Imaging study   Anemia in chronic illness This is likely anemia of chronic disease. The patient denies recent history of bleeding such as epistaxis, hematuria or hematochezia. She is asymptomatic from the anemia. We will observe for now.  She does not require transfusion now.       Multiple lung nodules She had multiple long nodules which appear stable. Continue periodic  imaging study as indicated.   Tracheostomy in place It looks fine without signs of infection. That was recently changed by ENT.     Hypokalemia This is chronic in nature, etiology unknown. It has resolved since oral potassium replacement. I recommend she eats potassium-rich diet.      Orders Placed This Encounter  Procedures  . CBC with Differential/Platelet    Standing Status: Future     Number of Occurrences:      Standing Expiration Date: 10/05/2015  . Comprehensive metabolic panel    Standing Status: Future     Number of Occurrences:      Standing Expiration Date: 10/05/2015  . TSH    Standing Status: Future     Number of Occurrences:      Standing Expiration Date: 10/05/2015   All questions were answered. The patient knows to call the clinic with any problems, questions or concerns. No barriers to learning was detected. I spent 25 minutes counseling  the patient face to face. The total time spent in the appointment was 30 minutes and more than 50% was on counseling and review of test results     [facility], [place], MD 09/01/2014 12:20 PM

## 2014-09-05 ENCOUNTER — Emergency Department (HOSPITAL_COMMUNITY)
Admission: EM | Admit: 2014-09-05 | Discharge: 2014-09-05 | Disposition: A | Discharge status: Home / Self Care | Payer: Medicaid Other | Attending: Emergency Medicine | Admitting: Emergency Medicine

## 2014-09-05 ENCOUNTER — Encounter (HOSPITAL_COMMUNITY): Payer: Self-pay

## 2014-09-05 ENCOUNTER — Emergency Department (HOSPITAL_COMMUNITY): Payer: Medicaid Other

## 2014-09-05 DIAGNOSIS — Z8521 Personal history of malignant neoplasm of larynx: Secondary | ICD-10-CM | POA: Diagnosis not present

## 2014-09-05 DIAGNOSIS — J209 Acute bronchitis, unspecified: Secondary | ICD-10-CM | POA: Diagnosis not present

## 2014-09-05 DIAGNOSIS — Z87891 Personal history of nicotine dependence: Secondary | ICD-10-CM | POA: Insufficient documentation

## 2014-09-05 DIAGNOSIS — Z930 Tracheostomy status: Secondary | ICD-10-CM | POA: Diagnosis not present

## 2014-09-05 DIAGNOSIS — I10 Essential (primary) hypertension: Secondary | ICD-10-CM | POA: Diagnosis not present

## 2014-09-05 DIAGNOSIS — Z8701 Personal history of pneumonia (recurrent): Secondary | ICD-10-CM | POA: Diagnosis not present

## 2014-09-05 DIAGNOSIS — Z862 Personal history of diseases of the blood and blood-forming organs and certain disorders involving the immune mechanism: Secondary | ICD-10-CM | POA: Diagnosis not present

## 2014-09-05 DIAGNOSIS — R05 Cough: Secondary | ICD-10-CM

## 2014-09-05 DIAGNOSIS — Z79899 Other long term (current) drug therapy: Secondary | ICD-10-CM | POA: Insufficient documentation

## 2014-09-05 DIAGNOSIS — R059 Cough, unspecified: Secondary | ICD-10-CM

## 2014-09-05 DIAGNOSIS — R079 Chest pain, unspecified: Secondary | ICD-10-CM | POA: Diagnosis present

## 2014-09-05 LAB — CBC WITH DIFFERENTIAL/PLATELET
BASOS ABS: 0 10*3/uL (ref 0.0–0.1)
BASOS PCT: 1 % (ref 0–1)
EOS ABS: 0.1 10*3/uL (ref 0.0–0.7)
EOS PCT: 2 % (ref 0–5)
HCT: 34 % — ABNORMAL LOW (ref 36.0–46.0)
Hemoglobin: 9.9 g/dL — ABNORMAL LOW (ref 12.0–15.0)
Lymphocytes Relative: 7 % — ABNORMAL LOW (ref 12–46)
Lymphs Abs: 0.4 10*3/uL — ABNORMAL LOW (ref 0.7–4.0)
MCH: 22.4 pg — ABNORMAL LOW (ref 26.0–34.0)
MCHC: 29.1 g/dL — AB (ref 30.0–36.0)
MCV: 77.1 fL — AB (ref 78.0–100.0)
MONOS PCT: 8 % (ref 3–12)
Monocytes Absolute: 0.5 10*3/uL (ref 0.1–1.0)
NEUTROS PCT: 82 % — AB (ref 43–77)
Neutro Abs: 5.2 10*3/uL (ref 1.7–7.7)
Platelets: 401 10*3/uL — ABNORMAL HIGH (ref 150–400)
RBC: 4.41 MIL/uL (ref 3.87–5.11)
RDW: 17.1 % — ABNORMAL HIGH (ref 11.5–15.5)
WBC: 6.2 10*3/uL (ref 4.0–10.5)

## 2014-09-05 LAB — I-STAT CHEM 8, ED
BUN: 10 mg/dL (ref 6–23)
CHLORIDE: 99 mmol/L (ref 96–112)
Calcium, Ion: 1.52 mmol/L — ABNORMAL HIGH (ref 1.12–1.23)
Creatinine, Ser: 1.3 mg/dL — ABNORMAL HIGH (ref 0.50–1.10)
Glucose, Bld: 95 mg/dL (ref 70–99)
HCT: 36 % (ref 36.0–46.0)
Hemoglobin: 12.2 g/dL (ref 12.0–15.0)
Potassium: 3 mmol/L — ABNORMAL LOW (ref 3.5–5.1)
Sodium: 137 mmol/L (ref 135–145)
TCO2: 24 mmol/L (ref 0–100)

## 2014-09-05 LAB — I-STAT TROPONIN, ED: TROPONIN I, POC: 0 ng/mL (ref 0.00–0.08)

## 2014-09-05 LAB — D-DIMER, QUANTITATIVE (NOT AT ARMC): D DIMER QUANT: 0.57 ug{FEU}/mL — AB (ref 0.00–0.48)

## 2014-09-05 MED ORDER — POTASSIUM CHLORIDE CRYS ER 20 MEQ PO TBCR
40.0000 meq | EXTENDED_RELEASE_TABLET | Freq: Once | ORAL | Status: AC
  Administered 2014-09-05: 40 meq via ORAL
  Filled 2014-09-05: qty 2

## 2014-09-05 MED ORDER — HYDROCODONE-ACETAMINOPHEN 5-325 MG PO TABS: 1.0000 | ORAL_TABLET | Freq: Four times a day (QID) | ORAL | Status: DC | PRN

## 2014-09-05 MED ORDER — HYDROCODONE-ACETAMINOPHEN 5-325 MG PO TABS
1.0000 | ORAL_TABLET | Freq: Once | ORAL | Status: AC
  Administered 2014-09-05: 1 via ORAL
  Filled 2014-09-05: qty 1

## 2014-09-05 MED ORDER — MORPHINE SULFATE 4 MG/ML IJ SOLN
4.0000 mg | Freq: Once | INTRAMUSCULAR | Status: AC
  Administered 2014-09-05: 4 mg via INTRAVENOUS
  Filled 2014-09-05: qty 1

## 2014-09-05 MED ORDER — ALBUTEROL SULFATE HFA 108 (90 BASE) MCG/ACT IN AERS
2.0000 | INHALATION_SPRAY | RESPIRATORY_TRACT | Status: DC | PRN
  Administered 2014-09-05: 2 via RESPIRATORY_TRACT
  Filled 2014-09-05 (×2): qty 6.7

## 2014-09-05 MED ORDER — ALBUTEROL SULFATE (2.5 MG/3ML) 0.083% IN NEBU
5.0000 mg | INHALATION_SOLUTION | Freq: Once | RESPIRATORY_TRACT | Status: AC
  Administered 2014-09-05: 5 mg via RESPIRATORY_TRACT
  Filled 2014-09-05: qty 6

## 2014-09-05 NOTE — ED Notes (Signed)
Per EMS: Pt began complaining of chest pain last night. Complaining of central chest tightness. EMS gave 324 ASA and 1 nitro. No improvement from pain. Also received 4mg  zofran. Hx. Trach x2 years. Complaining of increased secretions. BP 160/115, 96bpm, 97% on RA.

## 2014-09-05 NOTE — Discharge Instructions (Signed)
Acute Bronchitis Tylenol for mild pain or the pain medicine prescribed for bad pain. Call Dr.Osei-Bonsu tomorrow to arrange to be seen within one week. Your blood potassium  should be rechecked. Today's potassium was low at 3.0. Use your albuterol inhaler 2 puffs every 4 hours as needed for cough or shortness of breath.  Bronchitis is when the airways that extend from the windpipe into the lungs get red, puffy, and painful (inflamed). Bronchitis often causes thick spit (mucus) to develop. This leads to a cough. A cough is the most common symptom of bronchitis. In acute bronchitis, the condition usually begins suddenly and goes away over time (usually in 2 weeks). Smoking, allergies, and asthma can make bronchitis worse. Repeated episodes of bronchitis may cause more lung problems. HOME CARE  Rest.  Drink enough fluids to keep your pee (urine) clear or pale yellow (unless you need to limit fluids as told by your doctor).  Only take over-the-counter or prescription medicines as told by your doctor.  Avoid smoking and secondhand smoke. These can make bronchitis worse. If you are a smoker, think about using nicotine gum or skin patches. Quitting smoking will help your lungs heal faster.  Reduce the chance of getting bronchitis again by:  Washing your hands often.  Avoiding people with cold symptoms.  Trying not to touch your hands to your mouth, nose, or eyes.  Follow up with your doctor as told. GET HELP IF: Your symptoms do not improve after 1 week of treatment. Symptoms include:  Cough.  Fever.  Coughing up thick spit.  Body aches.  Chest congestion.  Chills.  Shortness of breath.  Sore throat. GET HELP RIGHT AWAY IF:   You have an increased fever.  You have chills.  You have severe shortness of breath.  You have bloody thick spit (sputum).  You throw up (vomit) often.  You lose too much body fluid (dehydration).  You have a severe headache.  You faint. MAKE  SURE YOU:   Understand these instructions.  Will watch your condition.  Will get help right away if you are not doing well or get worse. Document Released: 10/16/2007 Document Revised: 12/30/2012 Document Reviewed: 10/20/2012 [facility] Patient Information 2015 [place], [place]. This information is not intended to replace advice given to you by your health care provider. Make sure you discuss any questions you have with your health care provider.

## 2014-09-05 NOTE — ED Notes (Signed)
Pt A&OX4, ambulatory at d/c with steady gait, NAD 

## 2014-09-05 NOTE — ED Provider Notes (Addendum)
CSN: [id number]     Arrival date & time 09/05/14  2001 History   First MD Initiated Contact with Patient 09/05/14 2015     Chief Complaint  Patient presents with  . Chest Pain     (Consider location/radiation/quality/duration/timing/severity/associated sxs/prior Treatment) HPI Patient developed chest pain 24 hours ago chest pain is worse with cough she's been coughing, productive of white sputum. Chest pain is described as tightness anterior nonradiating .She denies fever. She denies fever she did admits to mild shortness of breath. No other associated symptoms. No nausea. Treated by EMS with one sublingual nitroglycerin and 324 of aspirin and 4 of Zofran, without relief. Past Medical History  Diagnosis Date  . Hypertension   . History of laryngeal cancer 02/03/2013  . Anemia, unspecified 06/17/2013  . Hypothyroidism (acquired) 06/17/2013  . Poor oral hygiene 06/17/2013  . Rib pain on left side 01/05/2014  . Throat cancer   . Pneumonia 01/14/2014  . Rib pain 01/14/2014  . Multiple lung nodules 06/28/2014   cardiac risk factors hypertension, ex-smoker otherwise negative Past Surgical History  Procedure Laterality Date  . Abdominal surgery    . Tracheostomy      for throat inflammation from radiation  . Esophagogastroduodenoscopy N/A 02/03/2013    Procedure: ESOPHAGOGASTRODUODENOSCOPY (EGD);  Surgeon: Juanita Craver, MD;  Location: [facility];  Service: Endoscopy;  Laterality: N/A;   Family History  Problem Relation Age of Onset  . Cancer Maternal Grandmother     skin cancer   History  Substance Use Topics  . Smoking status: Former Smoker -- 1.00 packs/day for 15 years    Quit date: 09/11/2010  . Smokeless tobacco: Never Used  . Alcohol Use: No   OB History    No data available     Review of Systems  HENT:       Tracheostomy for the past 4 years  Respiratory: Positive for cough and shortness of breath.   Cardiovascular: Positive for chest pain.  All other systems reviewed and are  negative.     Allergies  Review of patient's allergies indicates no known allergies.  Home Medications   Prior to Admission medications   Medication Sig Start Date End Date Taking? Authorizing Provider  acetaminophen (TYLENOL) 500 MG tablet Take 1 tablet (500 mg total) by mouth every 4 (four) hours as needed for pain. 02/05/13   Eugenie Filler, MD  potassium chloride SA (K-DUR,KLOR-CON) 20 MEQ tablet TAKE 1 TABLET(20 MEQ) BY MOUTH TWICE DAILY 08/29/14   Heath Lark, MD  topiramate (TOPAMAX) 50 MG tablet Take 50 mg by mouth 2 (two) times daily.    Historical Provider, MD  traZODone (DESYREL) 50 MG tablet Take 50 mg by mouth at bedtime.    Historical Provider, MD   BP 155/95 mmHg  Pulse 92  Temp(Src) 99.1 F (37.3 C) (Oral)  Resp 18  SpO2 96% Physical Exam  Constitutional: She is oriented to person, place, and time. She appears well-developed and well-nourished.  HENT:  Head: Normocephalic and atraumatic.  Eyes: Conjunctivae are normal. Pupils are equal, round, and reactive to light.  Neck: Neck supple. No tracheal deviation present. No thyromegaly present.  Tracheostomy in place  Cardiovascular: Normal rate and regular rhythm.   No murmur heard. Pulmonary/Chest: Effort normal. No respiratory distress.  Scant diffuse rhonchi area no restaurant distressed, speaks in paragraphs  Abdominal: Soft. Bowel sounds are normal. She exhibits no distension. There is no tenderness.  Musculoskeletal: Normal range of motion. She exhibits no edema or  tenderness.  Neurological: She is alert and oriented to person, place, and time. Coordination normal.  Skin: Skin is warm and dry. No rash noted.  Psychiatric: She has a normal mood and affect.  Nursing note and vitals reviewed.   ED Course  Procedures (including critical care time) Labs Review Labs Reviewed - No data to display  Imaging Review No results found.   EKG Interpretation None     ED ECG REPORT   Date: 09/05/2014  Rate:  95  Rhythm: normal sinus rhythm  QRS Axis: normal  Intervals: normal  ST/T Wave abnormalities: nonspecific T wave changes  Conduction Disutrbances:none  Narrative Interpretation:   Old EKG Reviewed: none available  I have personally reviewed the EKG tracing and agree with the computerized printout as noted. 10:30 PM feels much improved after treatment with intravenous morphine, and nebulized treatment. MDM  Pretest clinical suspicion of pulmonary embolism is extremely low. She has minimally elevated d-dimer however had negative CT scan of the chest on 08/26/2014. Old records indicate that she has rib pain on left side as of August 2015 which further corroborates low probability for pulmonary embolism. Also doubt cardiac etiology given chest pain worse with coughing. Symptoms consistent with acute bronchitis. Patient has lung nodules which are followed by primary care physician and by hematology/oncology Plan prescription Norco, albuterol HFA with spacer to go. Follow-up with Dr.Osei bonsu within one week for blood pressure recheck and serum potassium recheck Diagnosis #1 acute bronchitis #2 hypokalemia #3 anemia  Final diagnoses:  None        Orlie Dakin, MD 09/05/14 [place], MD 09/05/14 2334

## 2014-09-05 NOTE — Progress Notes (Signed)
RT arrived to give educate patient with Albuterol Inhaler. Upon arrival I found pt has a trach. Pt has been here a few hours and RT was never notified pt was here. Pt Sats maintaining 97-99% on RA. PT has a #6 Shiley trach. Pt is being discharged. RT offered trach care. Family declined. They stated that take care of pt's trach. RT did advise RN that they should call to make RT aware when a patient with trach is admitted. RT assisted with inhaler/spacer training. RT will monitor until discharge.

## 2014-09-21 ENCOUNTER — Encounter: Payer: Self-pay | Admitting: Skilled Nursing Facility1

## 2014-09-21 NOTE — Progress Notes (Signed)
Subjective:     Patient ID: Crystal Spence, female   DOB: Nov 20, 1975, 39 y.o.   MRN: [id number]  HPI   Review of Systems     Objective:   Physical Exam To assist the pt in gaining some lost wt back through dietary strategies.    Assessment:     Pt identified as being malnourished due to lost wt. Pt contacted via [phone]. Pt states she had lost some wt but has gained it back and is better now.    Plan:     No plan identified at this time.

## 2014-11-02 ENCOUNTER — Other Ambulatory Visit: Payer: Self-pay | Admitting: Physician Assistant

## 2014-11-02 DIAGNOSIS — Z1231 Encounter for screening mammogram for malignant neoplasm of breast: Secondary | ICD-10-CM

## 2015-08-31 ENCOUNTER — Encounter: Payer: Self-pay | Admitting: Hematology and Oncology

## 2015-08-31 ENCOUNTER — Encounter: Payer: Self-pay | Admitting: *Deleted

## 2015-08-31 ENCOUNTER — Other Ambulatory Visit: Payer: Medicaid Other

## 2015-08-31 ENCOUNTER — Ambulatory Visit: Payer: Medicaid Other | Admitting: Hematology and Oncology

## 2015-08-31 NOTE — Progress Notes (Signed)
No Show Letter from Dr. Alvy Bimler placed in outgoing mail to pt's home address.

## 2015-09-11 ENCOUNTER — Telehealth: Payer: Self-pay | Admitting: Hematology and Oncology

## 2015-09-11 NOTE — Telephone Encounter (Signed)
pt called to r/s appt...done....pt ok and aware of new d.t °

## 2015-09-29 ENCOUNTER — Encounter (HOSPITAL_COMMUNITY): Payer: Self-pay | Admitting: *Deleted

## 2015-10-03 ENCOUNTER — Telehealth: Payer: Self-pay | Admitting: Hematology and Oncology

## 2015-10-03 ENCOUNTER — Ambulatory Visit (HOSPITAL_BASED_OUTPATIENT_CLINIC_OR_DEPARTMENT_OTHER): Payer: Medicaid Other | Admitting: Hematology and Oncology

## 2015-10-03 ENCOUNTER — Other Ambulatory Visit: Payer: Self-pay | Admitting: Hematology and Oncology

## 2015-10-03 ENCOUNTER — Encounter: Payer: Self-pay | Admitting: Hematology and Oncology

## 2015-10-03 ENCOUNTER — Telehealth: Payer: Self-pay | Admitting: *Deleted

## 2015-10-03 ENCOUNTER — Other Ambulatory Visit (HOSPITAL_BASED_OUTPATIENT_CLINIC_OR_DEPARTMENT_OTHER): Payer: Medicaid Other

## 2015-10-03 VITALS — BP 104/68 | HR 80 | Temp 98.1°F | Resp 18 | Ht 61.0 in | Wt 99.0 lb

## 2015-10-03 DIAGNOSIS — D509 Iron deficiency anemia, unspecified: Secondary | ICD-10-CM | POA: Diagnosis not present

## 2015-10-03 DIAGNOSIS — D638 Anemia in other chronic diseases classified elsewhere: Secondary | ICD-10-CM

## 2015-10-03 DIAGNOSIS — Z8521 Personal history of malignant neoplasm of larynx: Secondary | ICD-10-CM

## 2015-10-03 DIAGNOSIS — E039 Hypothyroidism, unspecified: Secondary | ICD-10-CM

## 2015-10-03 LAB — CBC WITH DIFFERENTIAL/PLATELET
BASO%: 0.4 % (ref 0.0–2.0)
Basophils Absolute: 0 10*3/uL (ref 0.0–0.1)
EOS ABS: 0.1 10*3/uL (ref 0.0–0.5)
EOS%: 1.5 % (ref 0.0–7.0)
HCT: 30.6 % — ABNORMAL LOW (ref 34.8–46.6)
HGB: 9.2 g/dL — ABNORMAL LOW (ref 11.6–15.9)
LYMPH%: 4.4 % — ABNORMAL LOW (ref 14.0–49.7)
MCH: 26.4 pg (ref 25.1–34.0)
MCHC: 30.1 g/dL — ABNORMAL LOW (ref 31.5–36.0)
MCV: 87.9 fL (ref 79.5–101.0)
MONO#: 0.6 10*3/uL (ref 0.1–0.9)
MONO%: 6.7 % (ref 0.0–14.0)
NEUT%: 87 % — ABNORMAL HIGH (ref 38.4–76.8)
NEUTROS ABS: 7.1 10*3/uL — AB (ref 1.5–6.5)
Platelets: 254 10*3/uL (ref 145–400)
RBC: 3.48 10*6/uL — ABNORMAL LOW (ref 3.70–5.45)
RDW: 19.7 % — ABNORMAL HIGH (ref 11.2–14.5)
WBC: 8.2 10*3/uL (ref 3.9–10.3)
lymph#: 0.4 10*3/uL — ABNORMAL LOW (ref 0.9–3.3)

## 2015-10-03 LAB — COMPREHENSIVE METABOLIC PANEL
ALT: <9 U/L (ref 0–55)
AST: 20 U/L (ref 5–34)
Albumin: 3.2 g/dL — ABNORMAL LOW (ref 3.5–5.0)
Alkaline Phosphatase: 85 U/L (ref 40–150)
Anion Gap: 8 mEq/L (ref 3–11)
BUN: 7.2 mg/dL (ref 7.0–26.0)
CO2: 23 meq/L (ref 22–29)
Calcium: 8.6 mg/dL (ref 8.4–10.4)
Chloride: 108 mEq/L (ref 98–109)
Creatinine: 0.7 mg/dL (ref 0.6–1.1)
Glucose: 69 mg/dl — ABNORMAL LOW (ref 70–140)
POTASSIUM: 3.8 meq/L (ref 3.5–5.1)
Sodium: 139 mEq/L (ref 136–145)
TOTAL PROTEIN: 6.4 g/dL (ref 6.4–8.3)
Total Bilirubin: <0.3 mg/dL (ref 0.20–1.20)

## 2015-10-03 LAB — TSH: TSH: 7.258 m(IU)/L — ABNORMAL HIGH (ref 0.308–3.960)

## 2015-10-03 MED ORDER — LEVOTHYROXINE SODIUM 50 MCG PO TABS: 50.0000 ug | ORAL_TABLET | Freq: Every day | ORAL | Status: DC

## 2015-10-03 NOTE — Progress Notes (Signed)
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 According to the patient, she presented with sensation of sore throat. She was treated with chemotherapy weekly along with radiation therapy. The patient had placement of tracheostomy. She had unexplained weight loss in August 2015. The CT scan showed possible pneumonia and she was treated with antibiotic therapy.  INTERVAL HISTORY: Please see below for problem oriented charting. She feels well. Denies recent infection. No recent cough. She was found to be anemic and is currently taking iron supplement. GI workup is pending. The patient denies any recent signs or symptoms of bleeding such as spontaneous epistaxis, hematuria or hematochezia.   REVIEW OF SYSTEMS:   Constitutional: Denies fevers, chills or abnormal weight loss Eyes: Denies blurriness of vision Ears, nose, mouth, throat, and face: Denies mucositis or sore throat Respiratory: Denies cough, dyspnea or wheezes Cardiovascular: Denies palpitation, chest discomfort or lower extremity swelling Gastrointestinal:  Denies nausea, heartburn or change in bowel habits Skin: Denies abnormal skin rashes Lymphatics: Denies new lymphadenopathy or easy bruising Neurological:Denies numbness, tingling or new weaknesses Behavioral/Psych: Mood is stable, no new changes  All other systems were reviewed with the patient and are negative.  I have reviewed the past medical history, past surgical history, social history and family history with the patient and they are unchanged from previous note.  ALLERGIES:  has No Known Allergies.  MEDICATIONS:  Current  Outpatient Prescriptions  Medication Sig Dispense Refill  . acetaminophen (TYLENOL) 500 MG tablet Take 1 tablet (500 mg total) by mouth every 4 (four) hours as needed for pain. 30 tablet 0  . butalbital-acetaminophen-caffeine (FIORICET, ESGIC) 50-325-40 MG tablet Take 1 tablet by mouth 3 (three) times daily as needed for headache or migraine.   2  . CARTIA XT 240 MG 24 hr capsule Take 240 mg by mouth daily.  1  . ferrous sulfate 325 (65 FE) MG tablet Take 325 mg by mouth daily with breakfast.    . levothyroxine (SYNTHROID, LEVOTHROID) 25 MCG tablet Take 25 mcg by mouth daily.   0  . losartan (COZAAR) 25 MG tablet Take 25 mg by mouth daily.  1  . omeprazole (PRILOSEC) 20 MG capsule Take 20 mg by mouth daily.  0  . polyethylene glycol powder (GLYCOLAX/MIRALAX) powder Take 17 g by mouth daily as needed (For constipation.).   0  . potassium chloride SA (K-DUR,KLOR-CON) 20 MEQ tablet TAKE 1 TABLET(20 MEQ) BY MOUTH TWICE DAILY 180 tablet 1  . topiramate (TOPAMAX) 50 MG tablet Take 50 mg by mouth 2 (two) times daily.    . traZODone (DESYREL) 100 MG tablet Take 100 mg by mouth at bedtime.  1   No current facility-administered medications for this visit.    PHYSICAL EXAMINATION: ECOG PERFORMANCE STATUS: 0 - Asymptomatic  Filed Vitals:   10/03/15 0840  BP: 104/68  Pulse: 80  Temp: 98.1 F (36.7 C)  Resp: 18   Filed Weights   10/03/15 0840  Weight: 99 lb (44.906 kg)    GENERAL:alert, no distress and comfortable SKIN: skin color, texture, turgor are normal, no rashes or significant lesions EYES: normal, Conjunctiva are pink and non-injected, sclera clear OROPHARYNX:no exudate, no erythema and lips,  buccal mucosa, and tongue normal  NECK: supple, thyroid normal size, non-tender, without nodularity LYMPH:  no palpable lymphadenopathy in the cervical, axillary or inguinal LUNGS: clear to auscultation and percussion with normal breathing effort HEART: regular rate & rhythm and no murmurs and  no lower extremity edema ABDOMEN:abdomen soft, non-tender and normal bowel sounds Musculoskeletal:no cyanosis of digits and no clubbing  NEURO: alert & oriented x 3 with fluent speech, no focal motor/sensory deficits  LABORATORY DATA:  I have reviewed the data as listed    Component Value Date/Time   NA 139 10/03/2015 0826   NA 137 09/05/2014 2059   K 3.8 10/03/2015 0826   K 3.0* 09/05/2014 2059   CL 99 09/05/2014 2059   CO2 23 10/03/2015 0826   CO2 24 08/26/2014 0939   GLUCOSE 69* 10/03/2015 0826   GLUCOSE 95 09/05/2014 2059   BUN 7.2 10/03/2015 0826   BUN 10 09/05/2014 2059   CREATININE 0.7 10/03/2015 0826   CREATININE 1.30* 09/05/2014 2059   CALCIUM 8.6 10/03/2015 0826   CALCIUM 9.3 08/26/2014 0939   PROT 6.4 10/03/2015 0826   PROT 7.2 08/26/2014 0939   ALBUMIN 3.2* 10/03/2015 0826   ALBUMIN 4.1 08/26/2014 0939   AST 20 10/03/2015 0826   AST 22 08/26/2014 0939   ALT <9 10/03/2015 0826   ALT 10 08/26/2014 0939   ALKPHOS 85 10/03/2015 0826   ALKPHOS 71 08/26/2014 0939   BILITOT <0.30 10/03/2015 0826   BILITOT <0.1* 08/26/2014 0939   GFRNONAA >90 08/26/2014 0939   GFRAA >90 08/26/2014 0939    No results found for: SPEP, UPEP  Lab Results  Component Value Date   WBC 8.2 10/03/2015   NEUTROABS 7.1* 10/03/2015   HGB 9.2* 10/03/2015   HCT 30.6* 10/03/2015   MCV 87.9 10/03/2015   PLT 254 10/03/2015      Chemistry      Component Value Date/Time   NA 139 10/03/2015 0826   NA 137 09/05/2014 2059   K 3.8 10/03/2015 0826   K 3.0* 09/05/2014 2059   CL 99 09/05/2014 2059   CO2 23 10/03/2015 0826   CO2 24 08/26/2014 0939   BUN 7.2 10/03/2015 0826   BUN 10 09/05/2014 2059   CREATININE 0.7 10/03/2015 0826   CREATININE 1.30* 09/05/2014 2059      Component Value Date/Time   CALCIUM 8.6 10/03/2015 0826   CALCIUM 9.3 08/26/2014 0939   ALKPHOS 85 10/03/2015 0826   ALKPHOS 71 08/26/2014 0939   AST 20 10/03/2015 0826   AST 22 08/26/2014 0939   ALT <9 10/03/2015  0826   ALT 10 08/26/2014 0939   BILITOT <0.30 10/03/2015 0826   BILITOT <0.1* 08/26/2014 0939      ASSESSMENT & PLAN:  History of laryngeal cancer Recent imaging showed no evidence of cancer. The patient is a long-term cancer survivor. She has not seen ENT for some time. I recommend close follow-up and I will transition her care to cancer survivorship clinic next year with blood work, history and physical examination    Anemia in chronic illness She has multifactorial anemia with component of iron deficiency anemia. GI workup pending. 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 I recommend she follows closely with primary care doctor for medication adjustment.   Orders Placed This Encounter  Procedures  . Comprehensive metabolic panel    Standing Status: Future     Number of Occurrences:  Standing Expiration Date: 11/06/2016  . CBC with Differential/Platelet    Standing Status: Future     Number of Occurrences:      Standing Expiration Date: 11/06/2016  . TSH    Standing Status: Future     Number of Occurrences:      Standing Expiration Date: 11/06/2016  . Amb Referral to Survivorship Long term    Referral Priority:  Routine    Referral Type:  Consultation    Referred to Provider:  Holley Bouche, NP    Number of Visits Requested:  1   All questions were answered. The patient knows to call the clinic with any problems, questions or concerns. No barriers to learning was detected. I spent 15 minutes counseling the patient face to face. The total time spent in the appointment was 20 minutes and more than 50% was on counseling and review of test results     [facility], Emsley Custer, MD 10/03/2015 10:08 AM

## 2015-10-03 NOTE — Telephone Encounter (Signed)
Discussed orders w/ Dr. Alvy Bimler.  Levothyroxine not available in 37 mcg.  She changed order to 50 mcg.  Informed pt of abnormal TSH and pt states she is consistently taking 25 mcg daily.  Instructed her to double to 50 mcg and new Rx being sent to her pharmacy.  Instructed important to f/u w/ PCP in 3 months to recheck Thyroid level.  She verbalized understanding.

## 2015-10-03 NOTE — Telephone Encounter (Signed)
per pof to sch pt appt-sent staff message to Elzie Rings to sch [facility] in 1 year

## 2015-10-03 NOTE — Assessment & Plan Note (Signed)
She has multifactorial anemia with component of iron deficiency anemia. GI workup pending. I recommend she continue oral iron supplement

## 2015-10-03 NOTE — Assessment & Plan Note (Signed)
Recent imaging showed no evidence of cancer. The patient is a long-term cancer survivor. She has not seen ENT for some time. I recommend close follow-up and I will transition her care to cancer survivorship clinic next year with blood work, history and physical examination

## 2015-10-03 NOTE — Telephone Encounter (Signed)
-----   Message from Heath Lark, MD sent at 10/03/2015 10:09 AM EDT ----- Regarding: high TSH Is she taking it? If not, please make sure she continues taking it at 25 mcg. If she is taking it consistently, she will need dose increment to 37 mcg and recheck TSH in 3 mo with PCP ----- Message -----    From: [facility] Interface    Sent: 10/03/2015   8:38 AM      To: Heath Lark, MD

## 2015-10-03 NOTE — Assessment & Plan Note (Signed)
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 I recommend she follows closely with primary care doctor for medication adjustment.

## 2015-10-03 NOTE — Telephone Encounter (Signed)
  Oncology Nurse Navigator Documentation  Navigator Location: [facility] (10/03/15 1210) Navigator Encounter Type: Telephone (10/03/15 1210)               Barriers/Navigation Needs: Coordination of Care (10/03/15 1210)   Interventions: Referrals (10/03/15 1210)     Per Dr. Calton Dach guidance, called [facility], spoke with [place].  I identified Dr. Ruby Cola as patient's ENT, requested scheduling of follow-up visit at his next available.  She verbalized understanding.  Dr. Alvy Bimler and RN Cameo made aware.  Gayleen Orem, RN, BSN, [place] [facility] [place] [phone]   Dr.                      Time Spent with Patient: 15 (10/03/15 1210)

## 2015-10-12 ENCOUNTER — Ambulatory Visit (HOSPITAL_COMMUNITY): Payer: Medicaid Other | Admitting: Anesthesiology

## 2015-10-12 ENCOUNTER — Encounter (HOSPITAL_COMMUNITY): Payer: Self-pay | Admitting: *Deleted

## 2015-10-12 ENCOUNTER — Ambulatory Visit (HOSPITAL_COMMUNITY)
Admission: RE | Admit: 2015-10-12 | Discharge: 2015-10-12 | Disposition: A | Discharge status: Home / Self Care | Payer: Medicaid Other | Source: Ambulatory Visit | Attending: Gastroenterology | Admitting: Gastroenterology

## 2015-10-12 ENCOUNTER — Encounter (HOSPITAL_COMMUNITY)
Admission: RE | Disposition: A | Discharge status: Home / Self Care | Payer: Self-pay | Source: Ambulatory Visit | Attending: Gastroenterology

## 2015-10-12 DIAGNOSIS — Z9221 Personal history of antineoplastic chemotherapy: Secondary | ICD-10-CM | POA: Diagnosis not present

## 2015-10-12 DIAGNOSIS — Z8521 Personal history of malignant neoplasm of larynx: Secondary | ICD-10-CM | POA: Insufficient documentation

## 2015-10-12 DIAGNOSIS — Z87891 Personal history of nicotine dependence: Secondary | ICD-10-CM | POA: Insufficient documentation

## 2015-10-12 DIAGNOSIS — D509 Iron deficiency anemia, unspecified: Secondary | ICD-10-CM | POA: Diagnosis not present

## 2015-10-12 DIAGNOSIS — I10 Essential (primary) hypertension: Secondary | ICD-10-CM | POA: Insufficient documentation

## 2015-10-12 HISTORY — PX: ESOPHAGOGASTRODUODENOSCOPY: SHX5428

## 2015-10-12 HISTORY — PX: COLONOSCOPY WITH PROPOFOL: SHX5780

## 2015-10-12 SURGERY — COLONOSCOPY WITH PROPOFOL
Anesthesia: Monitor Anesthesia Care

## 2015-10-12 MED ORDER — MEPERIDINE HCL 100 MG/ML IJ SOLN: 6.2500 mg | INTRAMUSCULAR | Status: DC | PRN

## 2015-10-12 MED ORDER — ONDANSETRON HCL 4 MG/2ML IJ SOLN
INTRAMUSCULAR | Status: DC | PRN
  Administered 2015-10-12: 4 mg via INTRAVENOUS

## 2015-10-12 MED ORDER — SODIUM CHLORIDE 0.9 % IV SOLN
INTRAVENOUS | Status: DC
Start: 2015-10-12 — End: 2015-10-12

## 2015-10-12 MED ORDER — METOCLOPRAMIDE HCL 5 MG/ML IJ SOLN
10.0000 mg | Freq: Once | INTRAMUSCULAR | Status: DC | PRN
Start: 2015-10-12 — End: 2015-10-12

## 2015-10-12 MED ORDER — PROPOFOL 500 MG/50ML IV EMUL
INTRAVENOUS | Status: DC | PRN
  Administered 2015-10-12: 25 ug/kg/min via INTRAVENOUS

## 2015-10-12 MED ORDER — LIDOCAINE HCL (CARDIAC) 20 MG/ML IV SOLN
INTRAVENOUS | Status: AC
  Filled 2015-10-12: qty 5

## 2015-10-12 MED ORDER — FENTANYL CITRATE (PF) 250 MCG/5ML IJ SOLN
INTRAMUSCULAR | Status: DC | PRN
  Administered 2015-10-12 (×2): 50 ug via INTRAVENOUS

## 2015-10-12 MED ORDER — LIDOCAINE HCL (CARDIAC) 20 MG/ML IV SOLN
INTRAVENOUS | Status: DC | PRN
  Administered 2015-10-12: 50 mg via INTRAVENOUS

## 2015-10-12 MED ORDER — PROPOFOL 10 MG/ML IV BOLUS
INTRAVENOUS | Status: AC
  Filled 2015-10-12: qty 20

## 2015-10-12 MED ORDER — FENTANYL CITRATE (PF) 100 MCG/2ML IJ SOLN
INTRAMUSCULAR | Status: AC
  Filled 2015-10-12: qty 2

## 2015-10-12 MED ORDER — LACTATED RINGERS IV SOLN: INTRAVENOUS | Status: DC

## 2015-10-12 MED ORDER — ONDANSETRON HCL 4 MG/2ML IJ SOLN
INTRAMUSCULAR | Status: AC
  Filled 2015-10-12: qty 2

## 2015-10-12 MED ORDER — FENTANYL CITRATE (PF) 100 MCG/2ML IJ SOLN: 25.0000 ug | INTRAMUSCULAR | Status: DC | PRN

## 2015-10-12 MED ORDER — LACTATED RINGERS IV SOLN
INTRAVENOUS | Status: DC
  Administered 2015-10-12: 1000 mL via INTRAVENOUS
  Administered 2015-10-12: 11:00:00 via INTRAVENOUS

## 2015-10-12 SURGICAL SUPPLY — 21 items

## 2015-10-12 NOTE — H&P (Signed)
  The patient is a 40 year old female who comes to the endoscopy department today for EGD and colonoscopy because of iron deficiency anemia.  Physical she is in no distress  Nonicteric  Heart regular rhythm no murmurs  Lungs clear  Abdomen soft nontender  Neck show evidence of prior tracheostomy  Impression iron deficiency anemia  Plan colonoscopy and EGD.

## 2015-10-12 NOTE — Anesthesia Postprocedure Evaluation (Signed)
Anesthesia Post Note  Patient: Crystal Spence  Procedure(s) Performed: Procedure(s) (LRB): COLONOSCOPY WITH PROPOFOL (N/A) ESOPHAGOGASTRODUODENOSCOPY (EGD) (N/A)  Patient location during evaluation: Endoscopy Anesthesia Type: MAC Level of consciousness: awake and alert Pain management: pain level controlled Vital Signs Assessment: post-procedure vital signs reviewed and stable Respiratory status: spontaneous breathing, nonlabored ventilation, respiratory function stable and patient connected to nasal cannula oxygen Cardiovascular status: stable and blood pressure returned to baseline Anesthetic complications: no    Last Vitals:  Filed Vitals:   10/12/15 1130 10/12/15 1140  BP: 109/70 124/75  Pulse: 85 86  Temp:    Resp: 20 20    Last Pain:  Filed Vitals:   10/12/15 1155  PainSc: 4                  Montez Hageman

## 2015-10-12 NOTE — Op Note (Signed)
[facility] Patient Name: Crystal Spence Procedure Date: 10/12/2015 MRN: [id number] Attending MD: Wonda Horner , MD Date of Birth: August 08, 1975 CSN: [id number] Age: 40 Admit Type: Outpatient Procedure:                Colonoscopy Indications:              Iron deficiency anemia Providers:                Wonda Horner, MD, Laverta Baltimore, RN, Cletis Athens, Technician Referring MD:              Medicines:                Propofol per Anesthesia Complications:            No immediate complications. Estimated Blood Loss:     Estimated blood loss: none. Procedure:                Pre-Anesthesia Assessment:                           - Prior to the procedure, a History and Physical                            was performed, and patient medications and                            allergies were reviewed. The patient's tolerance of                            previous anesthesia was also reviewed. The risks                            and benefits of the procedure and the sedation                            options and risks were discussed with the patient.                            All questions were answered, and informed consent                            was obtained. Prior Anticoagulants: The patient has                            taken no previous anticoagulant or antiplatelet                            agents. ASA Grade Assessment: III - A patient with                            severe systemic disease. After reviewing the risks  and benefits, the patient was deemed in                            satisfactory condition to undergo the procedure.                           - Prior to the procedure, a History and Physical                            was performed, and patient medications and                            allergies were reviewed. The patient's tolerance of                            previous anesthesia was also  reviewed. The risks                            and benefits of the procedure and the sedation                            options and risks were discussed with the patient.                            All questions were answered, and informed consent                            was obtained. Prior Anticoagulants: The patient has                            taken no previous anticoagulant or antiplatelet                            agents. ASA Grade Assessment: III - A patient with                            severe systemic disease. After reviewing the risks                            and benefits, the patient was deemed in                            satisfactory condition to undergo the procedure.                           After obtaining informed consent, the colonoscope                            was passed under direct vision. Throughout the                            procedure, the patient's blood pressure, pulse, and  oxygen saturations were monitored continuously. The                            [id number] scope was introduced through                            the anus and advanced to the the cecum, identified                            by appendiceal orifice and ileocecal valve. The                            ileocecal valve, appendiceal orifice, and rectum                            were photographed. The colonoscopy was performed                            without difficulty. The patient tolerated the                            procedure well. The quality of the bowel                            preparation was good. Scope In: 10:58:38 AM Scope Out: 11:17:11 AM Scope Withdrawal Time: 0 hours 8 minutes 37 seconds  Total Procedure Duration: 0 hours 18 minutes 33 seconds  Findings:      The perianal and digital rectal examinations were normal.      The colon (entire examined portion) appeared normal. Impression:               - The entire examined colon  is normal.                           - No specimens collected. Moderate Sedation:      . Recommendation:           - Resume regular diet.                           - Continue present medications.                           - Repeat colonoscopy in 10 years for screening                            purposes.                           - Return to referring physician as previously                            scheduled. Procedure Code(s):        --- Professional ---                           [phone], Colonoscopy, flexible; diagnostic, including  collection of specimen(s) by brushing or washing,                            when performed (separate procedure) Diagnosis Code(s):        --- Professional ---                           D50.9, Iron deficiency anemia, unspecified CPT copyright 2016 American Medical Association. All rights reserved. The codes documented in this report are preliminary and upon coder review may  be revised to meet current compliance requirements. Anson Fret, MD Wonda Horner, MD 10/12/2015 11:24:12 AM This report has been signed electronically. Number of Addenda: 0

## 2015-10-12 NOTE — Op Note (Signed)
[facility] Patient Name: Crystal Spence Procedure Date: 10/12/2015 MRN: [id number] Attending MD: Wonda Horner , MD Date of Birth: [birth year] CSN: [id number] Age: 40 Admit Type: Outpatient Procedure:                Upper GI endoscopy Indications:              Iron deficiency anemia Providers:                Wonda Horner, MD, Laverta Baltimore, RN, Cletis Athens, Technician Referring MD:              Medicines:                Propofol per Anesthesia Complications:            No immediate complications. Estimated Blood Loss:     Estimated blood loss: none. Procedure:                Pre-Anesthesia Assessment:                           - Prior to the procedure, a History and Physical                            was performed, and patient medications and                            allergies were reviewed. The patient's tolerance of                            previous anesthesia was also reviewed. The risks                            and benefits of the procedure and the sedation                            options and risks were discussed with the patient.                            All questions were answered, and informed consent                            was obtained. Prior Anticoagulants: The patient has                            taken no previous anticoagulant or antiplatelet                            agents. ASA Grade Assessment: III - A patient with                            severe systemic disease. After reviewing the risks  and benefits, the patient was deemed in                            satisfactory condition to undergo the procedure.                           After obtaining informed consent, the endoscope was                            passed under direct vision. Throughout the                            procedure, the patient's blood pressure, pulse, and                            oxygen saturations  were monitored continuously. The                            [id number]) scope was introduced through the                            mouth, and advanced to the second part of duodenum.                            The upper GI endoscopy was accomplished without                            difficulty. The patient tolerated the procedure                            well. Scope In: Scope Out: Findings:      The examined esophagus was normal.      The entire examined stomach was normal.      The examined duodenum was normal. Impression:               - Normal esophagus.                           - Normal stomach.                           - Normal examined duodenum.                           - No specimens collected. Moderate Sedation:      . Recommendation:           - Resume regular diet.                           - Continue present medications. Procedure Code(s):        --- Professional ---                           [phone], Esophagogastroduodenoscopy, flexible,  transoral; diagnostic, including collection of                            specimen(s) by brushing or washing, when performed                            (separate procedure) Diagnosis Code(s):        --- Professional ---                           D50.9, Iron deficiency anemia, unspecified CPT copyright 2016 American Medical Association. All rights reserved. The codes documented in this report are preliminary and upon coder review may  be revised to meet current compliance requirements. Anson Fret, MD Wonda Horner, MD 10/12/2015 11:21:10 AM This report has been signed electronically. Number of Addenda: 0

## 2015-10-12 NOTE — Transfer of Care (Signed)
Immediate Anesthesia Transfer of Care Note  Patient: Crystal Spence  Procedure(s) Performed: Procedure(s): COLONOSCOPY WITH PROPOFOL (N/A) ESOPHAGOGASTRODUODENOSCOPY (EGD) (N/A)  Patient Location: PACU  Anesthesia Type:MAC  Level of Consciousness: awake, alert  and oriented  Airway & Oxygen Therapy: Patient Spontanous Breathing and Patient connected to nasal cannula oxygen  Post-op Assessment: Report given to RN and Post -op Vital signs reviewed and stable  Post vital signs: Reviewed and stable  Last Vitals:  Filed Vitals:   10/12/15 1001  BP: 137/87  Pulse: 102  Temp: 37.1 C  Resp: 19    Last Pain:  Filed Vitals:   10/12/15 1002  PainSc: 4       Patients Stated Pain Goal: 4 ([id number])  Complications: No apparent anesthesia complications

## 2015-10-12 NOTE — Anesthesia Preprocedure Evaluation (Signed)
Anesthesia Evaluation  Patient identified by MRN, date of birth, ID band Patient awake    Reviewed: Allergy & Precautions, NPO status , Patient's Chart, lab work & pertinent test results  Airway Mallampati: II  TM Distance: >3 FB Neck ROM: Full    Dental no notable dental hx. (+) Poor Dentition, Missing Very bad dentition:   Pulmonary neg pulmonary ROS, former smoker,    Pulmonary exam normal breath sounds clear to auscultation       Cardiovascular hypertension, Pt. 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   Dental advisory given  Plan Discussed with: CRNA  Anesthesia Plan Comments:         Anesthesia Quick Evaluation

## 2015-10-13 ENCOUNTER — Encounter (HOSPITAL_COMMUNITY): Payer: Self-pay | Admitting: Gastroenterology

## 2015-12-04 ENCOUNTER — Other Ambulatory Visit: Payer: Self-pay | Admitting: Hematology and Oncology

## 2015-12-04 DIAGNOSIS — D509 Iron deficiency anemia, unspecified: Secondary | ICD-10-CM

## 2015-12-05 ENCOUNTER — Telehealth: Payer: Self-pay | Admitting: Hematology and Oncology

## 2015-12-05 NOTE — Telephone Encounter (Signed)
lvm to inform pt of 8/3 appt at 230 pm per MD orders

## 2015-12-14 ENCOUNTER — Other Ambulatory Visit (HOSPITAL_BASED_OUTPATIENT_CLINIC_OR_DEPARTMENT_OTHER): Payer: Medicaid Other

## 2015-12-14 ENCOUNTER — Encounter: Payer: Self-pay | Admitting: Hematology and Oncology

## 2015-12-14 ENCOUNTER — Telehealth: Payer: Self-pay | Admitting: *Deleted

## 2015-12-14 ENCOUNTER — Ambulatory Visit (HOSPITAL_BASED_OUTPATIENT_CLINIC_OR_DEPARTMENT_OTHER): Payer: Medicaid Other | Admitting: Hematology and Oncology

## 2015-12-14 DIAGNOSIS — E039 Hypothyroidism, unspecified: Secondary | ICD-10-CM

## 2015-12-14 DIAGNOSIS — R7989 Other specified abnormal findings of blood chemistry: Secondary | ICD-10-CM | POA: Diagnosis not present

## 2015-12-14 DIAGNOSIS — D509 Iron deficiency anemia, unspecified: Secondary | ICD-10-CM | POA: Diagnosis not present

## 2015-12-14 DIAGNOSIS — D638 Anemia in other chronic diseases classified elsewhere: Secondary | ICD-10-CM

## 2015-12-14 DIAGNOSIS — R945 Abnormal results of liver function studies: Secondary | ICD-10-CM

## 2015-12-14 DIAGNOSIS — Z8521 Personal history of malignant neoplasm of larynx: Secondary | ICD-10-CM | POA: Diagnosis present

## 2015-12-14 LAB — COMPREHENSIVE METABOLIC PANEL
ALT: 27 U/L (ref 0–55)
ANION GAP: 11 meq/L (ref 3–11)
AST: 107 U/L — ABNORMAL HIGH (ref 5–34)
Albumin: 3.8 g/dL (ref 3.5–5.0)
Alkaline Phosphatase: 178 U/L — ABNORMAL HIGH (ref 40–150)
BUN: 4.6 mg/dL — ABNORMAL LOW (ref 7.0–26.0)
CHLORIDE: 107 meq/L (ref 98–109)
CO2: 20 meq/L — AB (ref 22–29)
Calcium: 9.4 mg/dL (ref 8.4–10.4)
Creatinine: 0.7 mg/dL (ref 0.6–1.1)
Glucose: 77 mg/dl (ref 70–140)
POTASSIUM: 3.8 meq/L (ref 3.5–5.1)
Sodium: 138 mEq/L (ref 136–145)
TOTAL PROTEIN: 7.3 g/dL (ref 6.4–8.3)

## 2015-12-14 LAB — CBC WITH DIFFERENTIAL/PLATELET
BASO%: 1 % (ref 0.0–2.0)
BASOS ABS: 0 10*3/uL (ref 0.0–0.1)
EOS%: 1.8 % (ref 0.0–7.0)
Eosinophils Absolute: 0.1 10*3/uL (ref 0.0–0.5)
HEMATOCRIT: 34.1 % — AB (ref 34.8–46.6)
HEMOGLOBIN: 10.3 g/dL — AB (ref 11.6–15.9)
LYMPH#: 0.4 10*3/uL — AB (ref 0.9–3.3)
LYMPH%: 10.5 % — ABNORMAL LOW (ref 14.0–49.7)
MCH: 27.2 pg (ref 25.1–34.0)
MCHC: 30.2 g/dL — ABNORMAL LOW (ref 31.5–36.0)
MCV: 90 fL (ref 79.5–101.0)
MONO#: 0.4 10*3/uL (ref 0.1–0.9)
MONO%: 9.8 % (ref 0.0–14.0)
NEUT#: 3.1 10*3/uL (ref 1.5–6.5)
NEUT%: 76.9 % — ABNORMAL HIGH (ref 38.4–76.8)
Platelets: 154 10*3/uL (ref 145–400)
RBC: 3.79 10*6/uL (ref 3.70–5.45)
RDW: 20.7 % — AB (ref 11.2–14.5)
WBC: 4 10*3/uL (ref 3.9–10.3)

## 2015-12-14 LAB — TSH: TSH: 3.011 m(IU)/L (ref 0.308–3.960)

## 2015-12-14 LAB — FERRITIN: FERRITIN: 18 ng/mL (ref 9–269)

## 2015-12-14 NOTE — Progress Notes (Signed)
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 According to the patient, she presented with sensation of sore throat. She was treated with chemotherapy weekly along with radiation therapy. The patient had placement of tracheostomy. She had unexplained weight loss in August 2015. The CT scan showed possible pneumonia and she was treated with antibiotic therapy. Last imaging study on 08/26/2014 show no evidence of recurrence of cancer INTERVAL HISTORY: Please see below for problem oriented charting. She feels well. Denies recent infection. No recent cough. She was found to be anemic and is currently taking iron supplement. GI workup is negative. The patient denies any recent signs or symptoms of bleeding such as spontaneous epistaxis, hematuria or hematochezia. She had recent skin burn after she returned from the beach  REVIEW OF SYSTEMS:   Constitutional: Denies fevers, chills or abnormal weight loss Eyes: Denies blurriness of vision Ears, nose, mouth, throat, and face: Denies mucositis or sore throat Respiratory: Denies cough, dyspnea or wheezes Cardiovascular: Denies palpitation, chest discomfort or lower extremity swelling Gastrointestinal:  Denies nausea, heartburn or change in bowel habits Lymphatics: Denies new lymphadenopathy or easy bruising Neurological:Denies numbness, tingling or new weaknesses Behavioral/Psych: Mood is stable, no new changes  All other systems were reviewed with the patient and are negative.  I have reviewed the past medical history, past surgical history, social history and family history with the patient  and they are unchanged from previous note.  ALLERGIES:  has No Known Allergies.  MEDICATIONS:  Current Outpatient Prescriptions  Medication Sig Dispense Refill  . acetaminophen (TYLENOL) 500 MG tablet Take 1 tablet (500 mg total) by mouth every 4 (four) hours as needed for pain. 30 tablet 0  . butalbital-acetaminophen-caffeine (FIORICET, ESGIC) 50-325-40 MG tablet Take 1 tablet by mouth 3 (three) times daily as needed for headache or migraine.   2  . CARTIA XT 240 MG 24 hr capsule Take 240 mg by mouth daily.  1  . etodolac (LODINE) 500 MG tablet Take 500 mg by mouth daily.    Marland Kitchen levothyroxine (SYNTHROID) 50 MCG tablet Take 1 tablet (50 mcg total) by mouth daily before breakfast. (Patient taking differently: Take 25 mcg by mouth daily before breakfast. ) 90 tablet 0  . losartan (COZAAR) 25 MG tablet Take 25 mg by mouth daily.  1  . montelukast (SINGULAIR) 10 MG tablet TK 1 T PO QD IN THE EVE  0  . omeprazole (PRILOSEC) 20 MG capsule Take 20 mg by mouth daily.  0  . polyethylene glycol powder (GLYCOLAX/MIRALAX) powder Take 17 g by mouth daily as needed (For constipation.).   0  . topiramate (TOPAMAX) 100 MG tablet TK 1 T PO  BID  2  . traZODone (DESYREL) 100 MG tablet Take 100 mg by mouth at bedtime.  1  . ferrous sulfate 325 (65 FE) MG tablet Take 325 mg by mouth daily with breakfast.    . potassium chloride SA (K-DUR,KLOR-CON) 20 MEQ tablet TAKE 1 TABLET(20 MEQ) BY MOUTH TWICE DAILY (Patient not taking: Reported on 12/14/2015) 180 tablet 1   No current facility-administered medications for this visit.     PHYSICAL EXAMINATION: ECOG PERFORMANCE STATUS: 0 - Asymptomatic  Vitals:  12/14/15 1440  BP: 104/66  Pulse: 92  Resp: 17  Temp: 98.1 F (36.7 C)   Filed Weights   12/14/15 1440  Weight: 95 lb 12.8 oz (43.5 kg)    GENERAL:alert, no distress and comfortable. She looks thin SKIN: She has skin discoloration on her face and her neck EYES: normal, Conjunctiva are pink and  non-injected, sclera clear OROPHARYNX:no exudate, no erythema and lips, buccal mucosa, and tongue normal  NECK: She has she has persistent hole in her neck from prior removal of tracheostomy LYMPH:  no palpable lymphadenopathy in the cervical, axillary or inguinal LUNGS: clear to auscultation and percussion with normal breathing effort HEART: regular rate & rhythm and no murmurs and no lower extremity edema ABDOMEN:abdomen soft, non-tender and normal bowel sounds Musculoskeletal:no cyanosis of digits and no clubbing  NEURO: alert & oriented x 3 with fluent speech, no focal motor/sensory deficits  LABORATORY DATA:  I have reviewed the data as listed    Component Value Date/Time   NA 138 12/14/2015 1412   K 3.8 12/14/2015 1412   CL 99 09/05/2014 2059   CO2 20 (L) 12/14/2015 1412   GLUCOSE 77 12/14/2015 1412   BUN 4.6 (L) 12/14/2015 1412   CREATININE 0.7 12/14/2015 1412   CALCIUM 9.4 12/14/2015 1412   PROT 7.3 12/14/2015 1412   ALBUMIN 3.8 12/14/2015 1412   AST 107 (H) 12/14/2015 1412   ALT 27 12/14/2015 1412   ALKPHOS 178 (H) 12/14/2015 1412   BILITOT <0.30 12/14/2015 1412   GFRNONAA >90 08/26/2014 0939   GFRAA >90 08/26/2014 0939    No results found for: SPEP, UPEP  Lab Results  Component Value Date   WBC 4.0 12/14/2015   NEUTROABS 3.1 12/14/2015   HGB 10.3 (L) 12/14/2015   HCT 34.1 (L) 12/14/2015   MCV 90.0 12/14/2015   PLT 154 12/14/2015      Chemistry      Component Value Date/Time   NA 138 12/14/2015 1412   K 3.8 12/14/2015 1412   CL 99 09/05/2014 2059   CO2 20 (L) 12/14/2015 1412   BUN 4.6 (L) 12/14/2015 1412   CREATININE 0.7 12/14/2015 1412      Component Value Date/Time   CALCIUM 9.4 12/14/2015 1412   ALKPHOS 178 (H) 12/14/2015 1412   AST 107 (H) 12/14/2015 1412   ALT 27 12/14/2015 1412   BILITOT <0.30 12/14/2015 1412      ASSESSMENT & PLAN:  History of laryngeal cancer Recent imaging showed no evidence of cancer. The patient is a long-term  cancer survivor.I recommend close follow-up with ENT The patient has persistent abnormal hole on previous tracheostomy site and I recommend she gets ENT evaluation for possible placement of stitches to close that gap on her neck I will transition her care to cancer survivorship clinic next year with blood work, history and physical examination    Anemia in chronic illness She has multifactorial anemia with component of iron deficiency anemia. GI workup is negative. 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 I recommend she follows closely with primary care doctor for medication adjustment.  Abnormal LFTs She has elevated AST and alkaline phosphatase without elevation of ALT or total bilirubin. She had recent sunburn causing skin peeling and I wondered that could cause abnormal blood work. She has appointment to see her primary care doctor next week and I gave her copies and recommend recheck in the new future  No orders of the defined types were placed in this encounter.  All questions were answered. The patient knows to call the clinic with any problems, questions or concerns. No barriers to learning was detected. I spent 15 minutes counseling the patient face to face. The total time spent in the appointment was 20 minutes and more than 50% was on counseling and review of test results     [facility], Dale Ribeiro, MD 12/14/2015 4:11 PM

## 2015-12-14 NOTE — Assessment & Plan Note (Signed)
She has elevated AST and alkaline phosphatase without elevation of ALT or total bilirubin. She had recent sunburn causing skin peeling and I wondered that could cause abnormal blood work. She has appointment to see her primary care doctor next week and I gave her copies and recommend recheck in the new future

## 2015-12-14 NOTE — Telephone Encounter (Signed)
-----   Message from Heath Lark, MD sent at 12/14/2015  3:43 PM EDT ----- Regarding: labs Pls call her with results Thyroid test is good but she is anemic Recommend she takes 1 iron OTC supplement at bed time ----- Message ----- From: Interface, [facility] Sent: 12/14/2015   2:23 PM To: Heath Lark, MD

## 2015-12-14 NOTE — Assessment & Plan Note (Addendum)
Recent imaging showed no evidence of cancer. The patient is a long-term cancer survivor.I recommend close follow-up with ENT The patient has persistent abnormal hole on previous tracheostomy site and I recommend she gets ENT evaluation for possible placement of stitches to close that gap on her neck I will transition her care to cancer survivorship clinic next year with blood work, history and physical examination

## 2015-12-14 NOTE — Assessment & Plan Note (Signed)
She has multifactorial anemia with component of iron deficiency anemia. GI workup is negative. I recommend she continue oral iron supplement

## 2015-12-14 NOTE — Telephone Encounter (Signed)
Pt notified of message below. Verbalized understanding 

## 2015-12-14 NOTE — Assessment & Plan Note (Signed)
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 I recommend she follows closely with primary care doctor for medication adjustment.

## 2015-12-21 ENCOUNTER — Telehealth: Payer: Self-pay | Admitting: *Deleted

## 2015-12-21 NOTE — Telephone Encounter (Signed)
Oncology Nurse Navigator Documentation  Oncology Nurse Navigator Flowsheets 10/03/2015 12/21/2015  Navigator Location [facility]  Navigator Encounter Type Telephone Telephone  Telephone - Outgoing Call  Treatment Phase - Post-Tx Follow-up  Barriers/Navigation Needs Coordination of Care Coordination of Care  Interventions Referrals -  Time Spent with Patient 15 15   Per Dr. Calton Dach guidance, called [facility] to arrange follow-up visit for assessment of trach site.  Spoke with Michel Bickers, requested that patient be contacted and next available appt arranged with Dr. Simeon Craft.  I was informed Dr. Simeon Craft no longer is with [facility], that Ms. Lindroth can be seen by Dr. Constance Holster.      Gayleen Orem, RN, BSN, [place] [facility] [place] [phone]

## 2016-01-12 ENCOUNTER — Telehealth: Payer: Self-pay | Admitting: *Deleted

## 2016-01-12 NOTE — Telephone Encounter (Signed)
Pt left VM yesterday afternoon requesting a referral from Dr. Alvy Bimler to [facility].  It sounded like she said it was for PCP but it was unclear.  I called pt back today to clarify her request.  LVM for pt asking her to call us back w/ more information.  What type of doctor does she need referral to see?  PCP does not require referral.

## 2016-01-16 ENCOUNTER — Telehealth: Payer: Self-pay | Admitting: *Deleted

## 2016-01-16 NOTE — Telephone Encounter (Signed)
Pt requested referral to [facility].  Called [facility] to make appt .Pt was previously seen by Dr Constance Holster. [facility] phone: [phone], fax: [phone]  Appt made for 9/26 @ 2:50 with Dr Constance Holster.   [facility] requires the ID # of [facility] per patients medicaid in order for patient to be seen.  RN left message on [facility]'s VM requesting ID number.  [facility] phone: [phone]

## 2016-03-17 ENCOUNTER — Encounter (HOSPITAL_COMMUNITY): Payer: Self-pay | Admitting: *Deleted

## 2016-03-17 ENCOUNTER — Inpatient Hospital Stay (HOSPITAL_COMMUNITY)
Admission: EM | Admit: 2016-03-17 | Discharge: 2016-03-20 | DRG: 177 | Disposition: A | Discharge status: Home / Self Care | Payer: Medicaid Other | Attending: Internal Medicine | Admitting: Internal Medicine

## 2016-03-17 ENCOUNTER — Emergency Department (HOSPITAL_COMMUNITY): Payer: Medicaid Other

## 2016-03-17 DIAGNOSIS — J690 Pneumonitis due to inhalation of food and vomit: Secondary | ICD-10-CM | POA: Diagnosis present

## 2016-03-17 DIAGNOSIS — R0902 Hypoxemia: Secondary | ICD-10-CM

## 2016-03-17 DIAGNOSIS — J852 Abscess of lung without pneumonia: Secondary | ICD-10-CM | POA: Diagnosis not present

## 2016-03-17 DIAGNOSIS — K056 Periodontal disease, unspecified: Secondary | ICD-10-CM | POA: Diagnosis present

## 2016-03-17 DIAGNOSIS — J851 Abscess of lung with pneumonia: Secondary | ICD-10-CM

## 2016-03-17 DIAGNOSIS — E538 Deficiency of other specified B group vitamins: Secondary | ICD-10-CM | POA: Diagnosis present

## 2016-03-17 DIAGNOSIS — E871 Hypo-osmolality and hyponatremia: Secondary | ICD-10-CM | POA: Diagnosis present

## 2016-03-17 DIAGNOSIS — E860 Dehydration: Secondary | ICD-10-CM | POA: Diagnosis present

## 2016-03-17 DIAGNOSIS — Z681 Body mass index (BMI) 19 or less, adult: Secondary | ICD-10-CM

## 2016-03-17 DIAGNOSIS — Z85819 Personal history of malignant neoplasm of unspecified site of lip, oral cavity, and pharynx: Secondary | ICD-10-CM

## 2016-03-17 DIAGNOSIS — Z8521 Personal history of malignant neoplasm of larynx: Secondary | ICD-10-CM | POA: Diagnosis not present

## 2016-03-17 DIAGNOSIS — Z9221 Personal history of antineoplastic chemotherapy: Secondary | ICD-10-CM | POA: Diagnosis not present

## 2016-03-17 DIAGNOSIS — B9620 Unspecified Escherichia coli [E. coli] as the cause of diseases classified elsewhere: Secondary | ICD-10-CM | POA: Diagnosis present

## 2016-03-17 DIAGNOSIS — D75839 Thrombocytosis, unspecified: Secondary | ICD-10-CM

## 2016-03-17 DIAGNOSIS — R059 Cough, unspecified: Secondary | ICD-10-CM

## 2016-03-17 DIAGNOSIS — Z808 Family history of malignant neoplasm of other organs or systems: Secondary | ICD-10-CM

## 2016-03-17 DIAGNOSIS — Z87891 Personal history of nicotine dependence: Secondary | ICD-10-CM | POA: Diagnosis not present

## 2016-03-17 DIAGNOSIS — J181 Lobar pneumonia, unspecified organism: Secondary | ICD-10-CM

## 2016-03-17 DIAGNOSIS — J189 Pneumonia, unspecified organism: Secondary | ICD-10-CM

## 2016-03-17 DIAGNOSIS — Z8701 Personal history of pneumonia (recurrent): Secondary | ICD-10-CM | POA: Diagnosis not present

## 2016-03-17 DIAGNOSIS — Z923 Personal history of irradiation: Secondary | ICD-10-CM

## 2016-03-17 DIAGNOSIS — I10 Essential (primary) hypertension: Secondary | ICD-10-CM | POA: Diagnosis present

## 2016-03-17 DIAGNOSIS — D649 Anemia, unspecified: Secondary | ICD-10-CM

## 2016-03-17 DIAGNOSIS — K089 Disorder of teeth and supporting structures, unspecified: Secondary | ICD-10-CM

## 2016-03-17 DIAGNOSIS — D729 Disorder of white blood cells, unspecified: Secondary | ICD-10-CM

## 2016-03-17 DIAGNOSIS — R05 Cough: Secondary | ICD-10-CM

## 2016-03-17 DIAGNOSIS — J069 Acute upper respiratory infection, unspecified: Secondary | ICD-10-CM

## 2016-03-17 DIAGNOSIS — R42 Dizziness and giddiness: Secondary | ICD-10-CM

## 2016-03-17 DIAGNOSIS — E039 Hypothyroidism, unspecified: Secondary | ICD-10-CM | POA: Diagnosis not present

## 2016-03-17 DIAGNOSIS — K029 Dental caries, unspecified: Secondary | ICD-10-CM

## 2016-03-17 DIAGNOSIS — D473 Essential (hemorrhagic) thrombocythemia: Secondary | ICD-10-CM

## 2016-03-17 DIAGNOSIS — E43 Unspecified severe protein-calorie malnutrition: Secondary | ICD-10-CM | POA: Diagnosis present

## 2016-03-17 DIAGNOSIS — R7989 Other specified abnormal findings of blood chemistry: Secondary | ICD-10-CM | POA: Diagnosis present

## 2016-03-17 DIAGNOSIS — D72828 Other elevated white blood cell count: Secondary | ICD-10-CM

## 2016-03-17 DIAGNOSIS — E876 Hypokalemia: Secondary | ICD-10-CM

## 2016-03-17 DIAGNOSIS — Z9114 Patient's other noncompliance with medication regimen: Secondary | ICD-10-CM

## 2016-03-17 DIAGNOSIS — I959 Hypotension, unspecified: Secondary | ICD-10-CM | POA: Diagnosis not present

## 2016-03-17 DIAGNOSIS — R0789 Other chest pain: Secondary | ICD-10-CM

## 2016-03-17 DIAGNOSIS — E872 Acidosis: Secondary | ICD-10-CM | POA: Diagnosis present

## 2016-03-17 DIAGNOSIS — R5383 Other fatigue: Secondary | ICD-10-CM

## 2016-03-17 DIAGNOSIS — E861 Hypovolemia: Secondary | ICD-10-CM | POA: Diagnosis present

## 2016-03-17 DIAGNOSIS — B9689 Other specified bacterial agents as the cause of diseases classified elsewhere: Secondary | ICD-10-CM | POA: Diagnosis not present

## 2016-03-17 DIAGNOSIS — N390 Urinary tract infection, site not specified: Secondary | ICD-10-CM

## 2016-03-17 DIAGNOSIS — D71 Functional disorders of polymorphonuclear neutrophils: Secondary | ICD-10-CM | POA: Diagnosis present

## 2016-03-17 LAB — URINE MICROSCOPIC-ADD ON

## 2016-03-17 LAB — DIFFERENTIAL
BASOS PCT: 1 %
Basophils Absolute: 0.1 10*3/uL (ref 0.0–0.1)
Eosinophils Absolute: 0.1 10*3/uL (ref 0.0–0.7)
Eosinophils Relative: 1 %
LYMPHS ABS: 0.5 10*3/uL — AB (ref 0.7–4.0)
Lymphocytes Relative: 5 %
MONO ABS: 0.5 10*3/uL (ref 0.1–1.0)
Monocytes Relative: 4 %
NEUTROS ABS: 9.6 10*3/uL — AB (ref 1.7–7.7)
Neutrophils Relative %: 89 %

## 2016-03-17 LAB — URINALYSIS, ROUTINE W REFLEX MICROSCOPIC
GLUCOSE, UA: NEGATIVE mg/dL
Ketones, ur: 15 mg/dL — AB
NITRITE: POSITIVE — AB
PH: 6 (ref 5.0–8.0)
Protein, ur: 30 mg/dL — AB
SPECIFIC GRAVITY, URINE: 1.02 (ref 1.005–1.030)

## 2016-03-17 LAB — BASIC METABOLIC PANEL
ANION GAP: 13 (ref 5–15)
BUN: 9 mg/dL (ref 6–20)
CHLORIDE: 98 mmol/L — AB (ref 101–111)
CO2: 21 mmol/L — ABNORMAL LOW (ref 22–32)
Calcium: 9.9 mg/dL (ref 8.9–10.3)
Creatinine, Ser: 0.92 mg/dL (ref 0.44–1.00)
GFR calc Af Amer: >60 mL/min (ref >60)
GLUCOSE: 85 mg/dL (ref 65–99)
POTASSIUM: 3.1 mmol/L — AB (ref 3.5–5.1)
SODIUM: 132 mmol/L — AB (ref 135–145)

## 2016-03-17 LAB — CBC
HEMATOCRIT: 36.6 % (ref 36.0–46.0)
HEMOGLOBIN: 11.7 g/dL — AB (ref 12.0–15.0)
MCH: 29.3 pg (ref 26.0–34.0)
MCHC: 32 g/dL (ref 30.0–36.0)
MCV: 91.5 fL (ref 78.0–100.0)
Platelets: 736 10*3/uL — ABNORMAL HIGH (ref 150–400)
RBC: 4 MIL/uL (ref 3.87–5.11)
RDW: 20 % — ABNORMAL HIGH (ref 11.5–15.5)
WBC: 10.8 10*3/uL — AB (ref 4.0–10.5)

## 2016-03-17 LAB — I-STAT TROPONIN, ED
TROPONIN I, POC: 0 ng/mL (ref 0.00–0.08)
TROPONIN I, POC: 0.02 ng/mL (ref 0.00–0.08)

## 2016-03-17 LAB — APTT: APTT: 38 s — AB (ref 24–36)

## 2016-03-17 LAB — PROTIME-INR
INR: 1
Prothrombin Time: 13.2 seconds (ref 11.4–15.2)

## 2016-03-17 LAB — CBG MONITORING, ED: Glucose-Capillary: 93 mg/dL (ref 65–99)

## 2016-03-17 MED ORDER — SODIUM CHLORIDE 0.9 % IV BOLUS (SEPSIS): 1000.0000 mL | Freq: Once | INTRAVENOUS | Status: DC

## 2016-03-17 MED ORDER — IPRATROPIUM BROMIDE 0.02 % IN SOLN
0.5000 mg | Freq: Once | RESPIRATORY_TRACT | Status: AC
  Administered 2016-03-17: 0.5 mg via RESPIRATORY_TRACT
  Filled 2016-03-17: qty 2.5

## 2016-03-17 MED ORDER — SODIUM CHLORIDE 0.9 % IV SOLN
1.5000 g | Freq: Four times a day (QID) | INTRAVENOUS | Status: DC
  Administered 2016-03-17 – 2016-03-19 (×6): 1.5 g via INTRAVENOUS
  Filled 2016-03-17 (×11): qty 1.5

## 2016-03-17 MED ORDER — DEXTROSE 5 % IV SOLN
1.0000 g | Freq: Once | INTRAVENOUS | Status: AC
  Administered 2016-03-17: 1 g via INTRAVENOUS
  Filled 2016-03-17: qty 10

## 2016-03-17 MED ORDER — SODIUM CHLORIDE 0.9 % IV BOLUS (SEPSIS)
2000.0000 mL | Freq: Once | INTRAVENOUS | Status: AC
  Administered 2016-03-17: 2000 mL via INTRAVENOUS

## 2016-03-17 MED ORDER — OXYCODONE-ACETAMINOPHEN 5-325 MG PO TABS
1.0000 | ORAL_TABLET | ORAL | Status: DC | PRN
  Administered 2016-03-17 – 2016-03-20 (×4): 2 via ORAL
  Filled 2016-03-17 (×4): qty 2

## 2016-03-17 MED ORDER — IOPAMIDOL (ISOVUE-300) INJECTION 61%
INTRAVENOUS | Status: AC
  Administered 2016-03-17: 75 mL
  Filled 2016-03-17: qty 75

## 2016-03-17 MED ORDER — SODIUM CHLORIDE 0.9 % IV SOLN
INTRAVENOUS | Status: DC
  Administered 2016-03-17 – 2016-03-20 (×4): via INTRAVENOUS

## 2016-03-17 MED ORDER — MORPHINE SULFATE (PF) 4 MG/ML IV SOLN
4.0000 mg | Freq: Once | INTRAVENOUS | Status: AC
Start: 2016-03-17 — End: 2016-03-17
  Administered 2016-03-17: 4 mg via INTRAVENOUS
  Filled 2016-03-17: qty 1

## 2016-03-17 MED ORDER — IPRATROPIUM-ALBUTEROL 0.5-2.5 (3) MG/3ML IN SOLN: 3.0000 mL | RESPIRATORY_TRACT | Status: DC | PRN

## 2016-03-17 MED ORDER — DEXTROSE 5 % IV SOLN
500.0000 mg | Freq: Once | INTRAVENOUS | Status: AC
  Administered 2016-03-17: 500 mg via INTRAVENOUS
  Filled 2016-03-17: qty 500

## 2016-03-17 MED ORDER — ENSURE ENLIVE PO LIQD
237.0000 mL | Freq: Two times a day (BID) | ORAL | Status: DC
  Administered 2016-03-18 – 2016-03-20 (×3): 237 mL via ORAL

## 2016-03-17 MED ORDER — PANTOPRAZOLE SODIUM 40 MG PO TBEC
40.0000 mg | DELAYED_RELEASE_TABLET | Freq: Every day | ORAL | Status: DC
  Administered 2016-03-18 – 2016-03-20 (×3): 40 mg via ORAL
  Filled 2016-03-17 (×3): qty 1

## 2016-03-17 MED ORDER — ALBUTEROL SULFATE (2.5 MG/3ML) 0.083% IN NEBU
5.0000 mg | INHALATION_SOLUTION | Freq: Once | RESPIRATORY_TRACT | Status: AC
  Administered 2016-03-17: 5 mg via RESPIRATORY_TRACT
  Filled 2016-03-17: qty 6

## 2016-03-17 MED ORDER — POTASSIUM CHLORIDE CRYS ER 10 MEQ PO TBCR
10.0000 meq | EXTENDED_RELEASE_TABLET | Freq: Two times a day (BID) | ORAL | Status: DC
  Administered 2016-03-17 – 2016-03-20 (×6): 10 meq via ORAL
  Filled 2016-03-17 (×6): qty 1

## 2016-03-17 MED ORDER — POTASSIUM CHLORIDE CRYS ER 20 MEQ PO TBCR
60.0000 meq | EXTENDED_RELEASE_TABLET | Freq: Once | ORAL | Status: AC
  Administered 2016-03-17: 60 meq via ORAL
  Filled 2016-03-17: qty 3

## 2016-03-17 MED ORDER — PREDNISONE 20 MG PO TABS
60.0000 mg | ORAL_TABLET | Freq: Once | ORAL | Status: AC
  Administered 2016-03-17: 60 mg via ORAL
  Filled 2016-03-17: qty 3

## 2016-03-17 MED ORDER — GUAIFENESIN ER 600 MG PO TB12
600.0000 mg | ORAL_TABLET | Freq: Two times a day (BID) | ORAL | Status: DC
  Administered 2016-03-17 – 2016-03-20 (×6): 600 mg via ORAL
  Filled 2016-03-17 (×6): qty 1

## 2016-03-17 MED ORDER — VANCOMYCIN HCL IN DEXTROSE 1-5 GM/200ML-% IV SOLN
1000.0000 mg | INTRAVENOUS | Status: DC
  Administered 2016-03-17 – 2016-03-18 (×2): 1000 mg via INTRAVENOUS
  Filled 2016-03-17 (×3): qty 200

## 2016-03-17 NOTE — H&P (Signed)
History and Physical    HAZELGRACE FANTROY [id number] DOB: 06/18/1975 DOA: 03/17/2016  PCP: Benito Mccreedy, MD   Patient coming from: Home  Chief Complaint: Productive cough, subjective fevers, decreased appetite x 3 weeks  HPI: Crystal Spence is a 40 y.o. 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  Sputum has been green.  She denies hemoptysis.  She has right sided pleuritic chest pain, worse with deep breathing and cough.  She has had some dyspnea on exertion.  No nausea, vomiting, or diarrhea.  She completed a Z pack as prescribed by an outside provider, without improvement in her symptoms.  She has not been on steroids.  No known sick contacts.  No recent travel or new exposures.  No visitors from outside of the country.  ED Course: Chest xray concerning for cavitary process to RLL.  CT chest was pursued in the ED which showed RLL infiltrates with associated abscesses, concerning for atypical pneumonia.  The patient has received IV rocephin and azithromycin.  Pulmonary consult requested from the ED. We will initiate respiratory isolation precautions for now.  Case has also been discussed with ID.  Will change antibiotic coverage to Unasyn and vancomycin.  Hospitalist to admit.  Review of Systems: Dysuria.  Otherwise, 10 systems reviewed and negative except as stated in the HPI.   Past Medical History:  Diagnosis Date  . Anemia, unspecified 06/17/2013  . History of laryngeal cancer 02/03/2013  . Hypertension   . Hypothyroidism (acquired) 06/17/2013  . Multiple lung nodules 06/28/2014  . Pneumonia 01/14/2014  . Poor oral hygiene 06/17/2013  . Rib pain 01/14/2014  . Rib pain on left side 01/05/2014  . Throat cancer [facility])       Past Surgical History:  Procedure Laterality Date  . ABDOMINAL SURGERY    . COLONOSCOPY WITH PROPOFOL N/A 10/12/2015   Procedure: COLONOSCOPY WITH PROPOFOL;  Surgeon: Wonda Horner, MD;  Location: [facility] END[facility]COPY;  Service: Endoscopy;  Laterality: N/A;  . ESOPHAGOGASTRODUODENOSCOPY N/A 02/03/2013   Procedure: ESOPHAGOGASTRODUODENOSCOPY (EGD);  Surgeon: Juanita Craver, MD;  Location: [facility];  Service: Endoscopy;  Laterality: N/A;  . ESOPHAGOGASTRODUODENOSCOPY N/A 10/12/2015   Procedure: ESOPHAGOGASTRODUODENOSCOPY (EGD);  Surgeon: Wonda Horner, MD;  Location: [facility];  Service: Endoscopy;  Laterality: N/A;  . TRACHEOSTOMY     for throat inflammation from radiation  . TUBAL LIGATION       reports that she quit smoking about 5 years ago. She has a 15.00 pack-year smoking history. She has never used smokeless tobacco. She reports that she does not drink alcohol or use drugs. She lives with her mother.  No Known Allergies  Family History  Problem Relation Age of Onset  . Cancer Maternal Grandmother     skin cancer     Prior to Admission medications   Medication Sig Start Date End Date Taking? Authorizing Provider  acetaminophen (TYLENOL) 500 MG tablet Take 1 tablet (500 mg total) by mouth every 4 (four) hours as needed for pain. 02/05/13  Yes Eugenie Filler, MD  azithromycin (ZITHROMAX Z-PAK) 250 MG tablet Take 250-500 mg by mouth daily. 500 mg for day 1 then 250 mg for 4 days. Finished on 03-14-16   Yes Historical Provider, MD  butalbital-acetaminophen-caffeine (FIORICET, ESGIC) [phone] MG  tablet Take 1 tablet by mouth 3 (three) times daily as needed for headache or migraine.  07/28/15  Yes Historical Provider, MD  CARTIA XT 240 MG 24 hr capsule Take 240 mg by mouth daily. 09/06/15  Yes Historical Provider, MD  etodolac (LODINE) 500 MG tablet Take 500 mg by mouth daily.   Yes Historical Provider, MD  ferrous sulfate 325 (65 FE) MG tablet Take 325 mg by mouth daily with breakfast.    Yes Historical Provider, MD  losartan (COZAAR) 25 MG tablet Take 25 mg by mouth daily. 06/28/15  Yes Historical Provider, MD  montelukast (SINGULAIR) 10 MG tablet TK 1 T PO QD IN THE EVE 11/12/15  Yes Historical Provider, MD  omeprazole (PRILOSEC) 20 MG capsule Take 20 mg by mouth daily. 08/01/15  Yes Historical Provider, MD  polyethylene glycol powder (GLYCOLAX/MIRALAX) powder Take 17 g by mouth daily as needed (For constipation.).  08/01/15  Yes Historical Provider, MD  topiramate (TOPAMAX) 100 MG tablet TK 1 T PO  BID 11/16/15  Yes Historical Provider, MD  traZODone (DESYREL) 100 MG tablet Take 100 mg by mouth at bedtime. 08/15/15  Yes Historical Provider, MD  levothyroxine (SYNTHROID) 50 MCG tablet Take 1 tablet (50 mcg total) by mouth daily before breakfast. Patient not taking: Reported on 03/17/2016 10/03/15   Heath Lark, MD  potassium chloride SA (K-DUR,KLOR-CON) 20 MEQ tablet TAKE 1 TABLET(20 MEQ) BY MOUTH TWICE DAILY Patient not taking: Reported on 03/17/2016 08/29/14   Heath Lark, MD    Physical Exam: Vitals:   03/17/16 1344 03/17/16 1436  BP: 100/81 108/86  Pulse: 102 93  Resp: 20 23  Temp: 97.5 F (36.4 C)   TempSrc: Oral   SpO2: 95% 98%      Constitutional: NAD, thin and chronically ill appearing but she is not acutely decompensating Vitals:   03/17/16 1344 03/17/16 1436  BP: 100/81 108/86  Pulse: 102 93  Resp: 20 23  Temp: 97.5 F (36.4 C)   TempSrc: Oral   SpO2: 95% 98%   Eyes: PERRL, lids and conjunctivae normal ENMT: Mucous membranes are dry, poor dentition.  No thrush.    Neck: thin, supple, former trach site apparent Respiratory: Ronchi at RLL.  No wheezing.  Normal respiratory effort. No accessory muscle use.  Cardiovascular: Normal rate, regular rhythm, no murmurs / rubs / gallops. No extremity edema. 2+ pedal pulses. GI: abdomen is soft and compressible.  No distention.  No tenderness.  Bowel sounds are present. Musculoskeletal:  No joint deformity in upper and  lower extremities. Good ROM, no contractures. Normal muscle tone.  Skin: no rashes, warm and dry Neurologic: No focal deficits. Psychiatric: Normal judgment and insight. Alert and oriented x 3.  Flat affect.     Labs on Admission: I have personally reviewed following labs and imaging studies  CBC:  Recent Labs Lab 03/17/16 1353  WBC 10.8*  NEUTROABS 9.6*  HGB 11.7*  HCT 36.6  MCV 91.5  PLT [id number]*   Basic Metabolic Panel:  Recent Labs Lab 03/17/16 1353  NA 132*  K 3.1*  CL 98*  CO2 21*  GLUCOSE 85  BUN 9  CREATININE 0.92  CALCIUM 9.9   GFR: CrCl cannot be calculated (Unknown ideal weight.).  CBG:  Recent Labs Lab 03/17/16 1351  GLUCAP 93   Urine analysis:    Component Value Date/Time   COLORURINE AMBER (A) 03/17/2016 1700   APPEARANCEUR TURBID (A) 03/17/2016 1700   LABSPEC 1.020 03/17/2016 1700   PHURINE 6.0  03/17/2016 1700   GLUCOSEU NEGATIVE 03/17/2016 1700   HGBUR SMALL (A) 03/17/2016 1700   BILIRUBINUR SMALL (A) 03/17/2016 1700   KETONESUR 15 (A) 03/17/2016 1700   PROTEINUR 30 (A) 03/17/2016 1700   UROBILINOGEN 0.2 12/06/2013 1718   NITRITE POSITIVE (A) 03/17/2016 1700   LEUKOCYTESUR LARGE (A) 03/17/2016 1700   Radiological Exams on Admission: Dg Chest 2 View  Result Date: 03/17/2016 CLINICAL DATA:  RIGHT side chest pain with cough productive of green material for 3 weeks, history of throat cancer 5 years ago, smoking history EXAM: CHEST  2 VIEW COMPARISON:  09/05/2014 FINDINGS: Tracheostomy tube no longer identified. Normal heart size, mediastinal contours, and pulmonary vascularity. Numerous calcified granulomata throughout both lungs greater on LEFT. Consolidation at lower RIGHT lung, in RIGHT lower lobe and questionably within RIGHT middle lobe as well consistent with pneumonia. Small cavitary focus suspected within the RIGHT lower lobe infiltrate. Remaining lungs clear emphysematous but clear. No pleural effusion or pneumothorax. Severe biconvex  thoracic scoliosis. IMPRESSION: Emphysematous and granulomatous disease changes with new infiltrates at the RIGHT lung base consistent with pneumonia. Suspected cavitation within the RIGHT lower lobe consolidation; CT chest with contrast recommended to exclude lung abscess and necrotic tumor in this patient with a history of throat cancer. Electronically Signed   By: Lavonia Dana M.D.   On: 03/17/2016 15:57   Ct Chest W Contrast  Result Date: 03/17/2016 CLINICAL DATA:  Three-week history of productive cough, fatigue and lack of appetite. Abnormal chest x-ray. EXAM: CT CHEST WITH CONTRAST TECHNIQUE: Multidetector CT imaging of the chest was performed during intravenous contrast administration. CONTRAST:  87mL ISOVUE-300 IOPAMIDOL (ISOVUE-300) INJECTION 61% COMPARISON:  Chest x-ray 03/17/2016 and prior chest CT 08/26/2014 FINDINGS: Chest wall: No breast masses, supraclavicular or axillary lymphadenopathy. Small scattered lymph nodes are noted. The thyroid gland is grossly normal. Cardiovascular: The heart is normal in size given the pectus deformity. No change since prior study. Mild mass effect on the right ventricle. The aorta is normal in caliber. No dissection. The branch vessels are patent. No coronary artery calcifications. Mediastinum/Nodes: No mediastinal or hilar mass or lymphadenopathy. Small scattered lymph nodes are noted. The esophagus is grossly normal. There are calcified mediastinal and hilar lymph nodes along with calcified granulomas in both lungs. Lungs/Pleura: Extensive right lower lobe pneumonia with associated lung abscesses. The left lung is clear. No pleural effusions. No worrisome pulmonary lesions. Upper Abdomen: No significant upper abdominal findings. Musculoskeletal: Significant scoliosis.  No bone lesions. IMPRESSION: 1. Right lower lobe infiltrates and associated abscesses. This is likely an aggressive or atypical pneumonia. 2. Evidence of prior granulomatous disease with numerous  calcified mediastinal and hilar lymph nodes and calcified granulomas in both lungs. 3. No mediastinal or hilar mass or lymphadenopathy. 4. No significant upper abdominal findings. Electronically Signed   By: Marijo Sanes M.D.   On: 03/17/2016 17:56    EKG: Independently reviewed. NSR.  No acute ST segment changes.  Assessment/Plan Principal Problem:   Pulmonary abscess (HCC) Active Problems:   Hypokalemia   History of laryngeal cancer   Hypothyroidism (acquired)   Pneumonia   Dehydration      Atypical pneumonia with cavitary abscesses on CT chest --IV vanc and unasyn per discussion with ID.  ID to see in routine consultation tomorrow. --Respiratory isolation pending routine consultation from pulmonary.  Patient may need consideration for bronch. --AFB smear and culture x 3 --Sputum culture when patient has produce a sample --Blood cultures --Check HIV --Check urine legionella and  streptococcal antigens --Oxygen as needed --Pulmonary toilet  Dehydration due to decreased PO intake --NS maintenance fluids --Regular diet as tolerated  Reactive thrombocytosis secondary to acute infection, monitor  History of hypothyroidism; reportedly not taking levothyroxine at this time --Check TFTs  UTI --Urine culture pending  Of note, medication reconciliation NOT performed in the ED prior to admission.  Home meds will need to be reviewed again in the AM.   DVT prophylaxis: SCDs Code Status: FULL Family Communication: Two sisters present in the ED at time of admission. Disposition Plan: To be determined. Consults called: Pulmonary, Infectious Disease Admission status: Inpatient, med surg   TIME SPENT: 39 minutes   Eber Jones MD [facility] Pager [phone]  If 7PM-7AM, please contact night-coverage [email or web address] Password [facility]  03/17/2016, 7:58 PM

## 2016-03-17 NOTE — Progress Notes (Signed)
REport received from ER RN. Room ready.

## 2016-03-17 NOTE — ED Notes (Signed)
Pt transported to xray 

## 2016-03-17 NOTE — ED Notes (Signed)
Pt transported to CT ?

## 2016-03-17 NOTE — ED Notes (Signed)
Family at bedside. Sister in Sports coach and niece at the beside.

## 2016-03-17 NOTE — ED Notes (Signed)
Patient transported to CT 

## 2016-03-17 NOTE — ED Notes (Signed)
PA at bedside updating pt 

## 2016-03-17 NOTE — ED Notes (Signed)
Pt ambulated to restroom with EMT.  Gait steady and even.  Pt able to ambulate independently.

## 2016-03-17 NOTE — Consult Note (Signed)
Name: Crystal Spence MRN: [id number] DOB: 22-Jan-1976    ADMISSION DATE:  03/17/2016 CONSULTATION DATE:  03/17/2016  REFERRING MD :  Dr. Eulas Post [facility]  CHIEF COMPLAINT:  Cough  BRIEF PATIENT DESCRIPTION: 40 year old female with history of laryngeal cancer s/p chemo and radiation 2012. Presented 11/5 with CC cough and dizziness, found to have cavitary PNA vs lung abscess. Admitted to hospitalist with PCCM consult.  SIGNIFICANT EVENTS  11/5 admit  STUDIES:  CT chest 11/5 >Right lower lobe infiltrates and associated abscesses. This is likely an aggressive or atypical pneumonia. Evidence of prior granulomatous disease with numerous calcified mediastinal and hilar lymph nodes and calcified granulomas in both lungs.  HISTORY OF PRESENT ILLNESS:  40 year old female with PMHS as below, which is significant for laryngeal cancer in 2012 )s/p chemo, ratdation, and tracheostomy followed by ENT, now decannulated), poor dentition recently having all of her top teeth removed, and hypothyroidism. She presented to [facility] emergency department 03/17/2016 with complaints of productive cough and fatigue for about the past 3 weeks. She describes poor oral intake during that time. She feels that her symptoms started after recently having some teeth removed. She presented to her PCP with these complaints and was prescribed a 5 day course of azithromycin without improvement. In the emergency department she is on the right lower lobe consolidation on chest x-ray concerning for atypical process. She was referred for CAT scan of the chest demonstrating right lower lobe consolidation with concern for cavitary lesions/pulmonary abscesses. Pulmonary consulted for further evaluation.  Currently she complains of productive cough, chills, weakness, poor appetite, and body aches. She denies hemoptysis, recent sick contacts, foreign travel, contacts with people with recent foreign travel, and incarcerations. This has been  going on for 3 weeks and she continues to feel worse.   PAST MEDICAL HISTORY :   has a past medical history of Anemia, unspecified (06/17/2013); History of laryngeal cancer (02/03/2013); Hypertension; Hypothyroidism (acquired) (06/17/2013); Multiple lung nodules (06/28/2014); Pneumonia (01/14/2014); Poor oral hygiene (06/17/2013); Rib pain (01/14/2014); Rib pain on left side (01/05/2014); and Throat cancer ([place]).  has a past surgical history that includes Abdominal surgery; Tracheostomy; Esophagogastroduodenoscopy (N/A, 02/03/2013); Tubal ligation; Colonoscopy with propofol (N/A, 10/12/2015); and Esophagogastroduodenoscopy (N/A, 10/12/2015). Prior to Admission medications   Medication Sig Start Date End Date Taking? Authorizing Provider  acetaminophen (TYLENOL) 500 MG tablet Take 1 tablet (500 mg total) by mouth every 4 (four) hours as needed for pain. 02/05/13  Yes Eugenie Filler, MD  azithromycin (ZITHROMAX Z-PAK) 250 MG tablet Take 250-500 mg by mouth daily. 500 mg for day 1 then 250 mg for 4 days. Finished on 03-14-16   Yes Historical Provider, MD  butalbital-acetaminophen-caffeine (FIORICET, ESGIC) 50-325-40 MG tablet Take 1 tablet by mouth 3 (three) times daily as needed for headache or migraine.  07/28/15  Yes Historical Provider, MD  CARTIA XT 240 MG 24 hr capsule Take 240 mg by mouth daily. 09/06/15  Yes Historical Provider, MD  etodolac (LODINE) 500 MG tablet Take 500 mg by mouth daily.   Yes Historical Provider, MD  ferrous sulfate 325 (65 FE) MG tablet Take 325 mg by mouth daily with breakfast.   Yes Historical Provider, MD  losartan (COZAAR) 25 MG tablet Take 25 mg by mouth daily. 06/28/15  Yes Historical Provider, MD  montelukast (SINGULAIR) 10 MG tablet TK 1 T PO QD IN THE EVE 11/12/15  Yes Historical Provider, MD  omeprazole (PRILOSEC) 20 MG capsule Take 20 mg by  mouth daily. 08/01/15  Yes Historical Provider, MD  polyethylene glycol powder (GLYCOLAX/MIRALAX) powder Take 17 g by mouth daily as needed (For  constipation.).  08/01/15  Yes Historical Provider, MD  topiramate (TOPAMAX) 100 MG tablet TK 1 T PO  BID 11/16/15  Yes Historical Provider, MD  traZODone (DESYREL) 100 MG tablet Take 100 mg by mouth at bedtime. 08/15/15  Yes Historical Provider, MD  levothyroxine (SYNTHROID) 50 MCG tablet Take 1 tablet (50 mcg total) by mouth daily before breakfast. Patient not taking: Reported on 03/17/2016 10/03/15   Heath Lark, MD  potassium chloride SA (K-DUR,KLOR-CON) 20 MEQ tablet TAKE 1 TABLET(20 MEQ) BY MOUTH TWICE DAILY Patient not taking: Reported on 03/17/2016 08/29/14   Heath Lark, MD   No Known Allergies  FAMILY HISTORY:  family history includes Cancer in her maternal grandmother. SOCIAL HISTORY:  reports that she quit smoking about 5 years ago. She has a 15.00 pack-year smoking history. She has never used smokeless tobacco. She reports that she does not drink alcohol or use drugs.  REVIEW OF SYSTEMS:   As discussed in HPI  SUBJECTIVE:   VITAL SIGNS: Temp:  [97.5 F (36.4 C)] 97.5 F (36.4 C) (11/05 1344) Pulse Rate:  [93-102] 93 (11/05 1436) Resp:  [20-23] 23 (11/05 1436) BP: (100-108)/(81-86) 108/86 (11/05 1436) SpO2:  [95 %-98 %] 98 % (11/05 1436)  PHYSICAL EXAMINATION: General:  Frail cachectic middle aged female who appears weak and older than stated age Neuro:  Alert, oriented, non-focal HEENT:  [place]/AT, small tracheostomy fistula remains. PERRL, no JVD Cardiovascular:  RRR, no MRG Lungs:  Coarse throughout, diminished R base. Abdomen:  Soft, non-tender, non-distended Musculoskeletal: no acute deformity or ROM limitation Skin:  Grossly intact, appears jaundiced.   Recent Labs Lab 03/17/16 1353  NA 132*  K 3.1*  CL 98*  CO2 21*  BUN 9  CREATININE 0.92  GLUCOSE 85    Recent Labs Lab 03/17/16 1353  HGB 11.7*  HCT 36.6  WBC 10.8*  PLT 736*   Dg Chest 2 View  Result Date: 03/17/2016 CLINICAL DATA:  RIGHT side chest pain with cough productive of green material for 3  weeks, history of throat cancer 5 years ago, smoking history EXAM: CHEST  2 VIEW COMPARISON:  09/05/2014 FINDINGS: Tracheostomy tube no longer identified. Normal heart size, mediastinal contours, and pulmonary vascularity. Numerous calcified granulomata throughout both lungs greater on LEFT. Consolidation at lower RIGHT lung, in RIGHT lower lobe and questionably within RIGHT middle lobe as well consistent with pneumonia. Small cavitary focus suspected within the RIGHT lower lobe infiltrate. Remaining lungs clear emphysematous but clear. No pleural effusion or pneumothorax. Severe biconvex thoracic scoliosis. IMPRESSION: Emphysematous and granulomatous disease changes with new infiltrates at the RIGHT lung base consistent with pneumonia. Suspected cavitation within the RIGHT lower lobe consolidation; CT chest with contrast recommended to exclude lung abscess and necrotic tumor in this patient with a history of throat cancer. Electronically Signed   By: Lavonia Dana M.D.   On: 03/17/2016 15:57   Ct Chest W Contrast  Result Date: 03/17/2016 CLINICAL DATA:  Three-week history of productive cough, fatigue and lack of appetite. Abnormal chest x-ray. EXAM: CT CHEST WITH CONTRAST TECHNIQUE: Multidetector CT imaging of the chest was performed during intravenous contrast administration. CONTRAST:  20mL ISOVUE-300 IOPAMIDOL (ISOVUE-300) INJECTION 61% COMPARISON:  Chest x-ray 03/17/2016 and prior chest CT 08/26/2014 FINDINGS: Chest wall: No breast masses, supraclavicular or axillary lymphadenopathy. Small scattered lymph nodes are noted. The thyroid gland is grossly  normal. Cardiovascular: The heart is normal in size given the pectus deformity. No change since prior study. Mild mass effect on the right ventricle. The aorta is normal in caliber. No dissection. The branch vessels are patent. No coronary artery calcifications. Mediastinum/Nodes: No mediastinal or hilar mass or lymphadenopathy. Small scattered lymph nodes are  noted. The esophagus is grossly normal. There are calcified mediastinal and hilar lymph nodes along with calcified granulomas in both lungs. Lungs/Pleura: Extensive right lower lobe pneumonia with associated lung abscesses. The left lung is clear. No pleural effusions. No worrisome pulmonary lesions. Upper Abdomen: No significant upper abdominal findings. Musculoskeletal: Significant scoliosis.  No bone lesions. IMPRESSION: 1. Right lower lobe infiltrates and associated abscesses. This is likely an aggressive or atypical pneumonia. 2. Evidence of prior granulomatous disease with numerous calcified mediastinal and hilar lymph nodes and calcified granulomas in both lungs. 3. No mediastinal or hilar mass or lymphadenopathy. 4. No significant upper abdominal findings. Electronically Signed   By: Marijo Sanes M.D.   On: 03/17/2016 17:56    ASSESSMENT / PLAN:  CAP/RLL consolidation - 3 week history of URI type symptoms now with productive cough, worsening malaise, and chills. CXR/CT find RLL consolidation with concern for pulmonary abscess or cavitation. Reasonable to continue airborne precautions, however, doubt this is TB based on history and radiography.   Plan: Supplemental O2 PRN to keep O2 sat > 92% Ceftriaxone/azithromycin for ABX Blood, sputum, urine cultures Urine strep, legionella antigens Respiratory viral panel, however, 3 weeks of symptoms doubt viral AFB sputum May need bronchoscopy for BAL if no culture growth May need CVTS consult for VATs   Georgann Housekeeper, AGACNP-BC  Pulmonology/Critical Care Pager [phone] or [phone]  03/17/2016 8:12 PM  Rush Farmer, M.D. [facility] Pulmonary/Critical Care Medicine. Pager: [phone]. After hours pager: [phone].

## 2016-03-17 NOTE — ED Triage Notes (Signed)
Pt reports not feeling well x 3 weeks. Reports dizziness and lack of appetite, fatigue and productive cough. Denies fever. No acute distress noted at triage.

## 2016-03-17 NOTE — ED Notes (Signed)
Patient transported to X-ray 

## 2016-03-17 NOTE — ED Notes (Signed)
MD at bedside. 

## 2016-03-17 NOTE — ED Provider Notes (Signed)
[facility] DEPT Provider Note   CSN: [id number] Arrival date & time: 03/17/16  1332     History   Chief Complaint Chief Complaint  Patient presents with  . Dizziness  . Cough    HPI Crystal Spence is a 40 y.o. 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 Patient states that she hasn't really been eating much over the last month and has gradually become more weak. Had her top teeth removed and states that it seems like her symptoms started after that. She was seen by her PCP for her cough and URI symptoms, given 5 day course of azithromycin which she finished 3 days ago, but this did not help her symptoms. She tried over-the-counter cough medicine which also did not help. No known aggravating factors. She states her right rib pain is only when she coughs, and the lightheadedness is only occurring when she stands up. She also reports she's had some dysuria as well. Patient is a nonsmoker, no known sick contacts.  She denies any fevers, chills, sore throat, ear pain or drainage, difficulty swallowing/dysphagia, oral lesions, chest pain, shortness of breath, hemoptysis, abdominal pain, nausea, vomiting, diarrhea, constipation, melena, hematochezia, hematuria, numbness, tingling, or focal weakness. Denies any other complaints at this time. No recent travel. PCP is Dr. Vista Lawman. Oncologist is Dr. Alvy Bimler. Chart review reveals that in 08/2014 she saw her oncologist due to having had PNA and some weight loss the  prior year, treated with abx and had a f/up CT chest that was negative for acute cancerous disease or ongoing PNA; notes indicate that she had no active disease at that time.   The history is provided by the patient and medical records. No language interpreter was used.  Cough  This is a new problem. The current episode started more than 1 week ago. The problem occurs constantly. The problem has not changed since onset.The cough is productive of purulent sputum. There has been no fever. Associated symptoms include rhinorrhea and wheezing. Pertinent negatives include no chest pain, no chills, no ear pain, no sore throat, no myalgias and no shortness of breath. She has tried cough syrup (OTC cough med and Zpak) for the symptoms. The treatment provided no relief. She is not a smoker.    Past Medical History:  Diagnosis Date  . Anemia, unspecified 06/17/2013  . History of laryngeal cancer 02/03/2013  . Hypertension   . Hypothyroidism (acquired) 06/17/2013  . Multiple lung nodules 06/28/2014  . Pneumonia 01/14/2014  . Poor oral hygiene 06/17/2013  . Rib pain 01/14/2014  . Rib pain on left side 01/05/2014  . Throat cancer [facility])     Patient Active Problem List   Diagnosis Date Noted  . Abnormal LFTs 12/14/2015  . Iron deficiency anemia 12/04/2015  . Multiple lung nodules 06/28/2014  . Other type of nonintractable migraine 06/28/2014  . Rib pain 01/14/2014  . Rib pain on left side 01/05/2014  . Unexplained weight loss 01/05/2014  . Anemia in chronic illness 06/17/2013  . Hypothyroidism (acquired) 06/17/2013  . Poor oral hygiene 06/17/2013  .  Acute blood loss anemia 02/03/2013  . Syncope 02/03/2013  . Hyponatremia 02/03/2013  . History of laryngeal cancer 02/03/2013  . 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 Upper GI bleed 02/02/2013    Past Surgical History:  Procedure Laterality Date  . ABDOMINAL SURGERY    . COLONOSCOPY WITH PROPOFOL N/A 10/12/2015   Procedure: COLONOSCOPY WITH PROPOFOL;  Surgeon:  Wonda Horner, MD;  Location: [facility] ENDOSCOPY;  Service: Endoscopy;  Laterality: N/A;  . ESOPHAGOGASTRODUODENOSCOPY N/A 02/03/2013   Procedure: ESOPHAGOGASTRODUODENOSCOPY (EGD);  Surgeon: Juanita Craver, MD;  Location: [facility] ENDOSCOPY;  Service: Endoscopy;  Laterality: N/A;  . ESOPHAGOGASTRODUODENOSCOPY N/A 10/12/2015   Procedure: ESOPHAGOGASTRODUODENOSCOPY (EGD);  Surgeon: Wonda Horner, MD;  Location: [facility];  Service: Endoscopy;  Laterality: N/A;  . TRACHEOSTOMY     for throat inflammation from radiation  . TUBAL LIGATION      OB History    No data available       Home Medications    Prior to Admission medications   Medication Sig Start Date End Date Taking? Authorizing Provider  acetaminophen (TYLENOL) 500 MG tablet Take 1 tablet (500 mg total) by mouth every 4 (four) hours as needed for pain. 02/05/13   Eugenie Filler, MD  butalbital-acetaminophen-caffeine (FIORICET, ESGIC) [phone] MG tablet Take 1 tablet by mouth 3 (three) times daily as needed for headache or migraine.  07/28/15   Historical Provider, MD  CARTIA XT 240 MG 24 hr capsule Take 240 mg by mouth daily. 09/06/15   Historical Provider, MD  etodolac (LODINE) 500 MG tablet Take 500 mg by mouth daily.    Historical Provider, MD  ferrous sulfate 325 (65 FE) MG tablet Take 325 mg by mouth daily with breakfast.    Historical Provider, MD  levothyroxine (SYNTHROID) 50 MCG tablet Take 1 tablet (50 mcg total) by mouth daily before breakfast. Patient taking differently: Take 25 mcg by mouth daily before breakfast.  10/03/15   Heath Lark, MD  losartan (COZAAR) 25 MG tablet Take 25 mg by mouth daily. 06/28/15   Historical Provider, MD  montelukast (SINGULAIR) 10 MG tablet TK 1 T PO QD IN THE EVE 11/12/15   Historical Provider, MD  omeprazole (PRILOSEC) 20 MG capsule Take 20 mg by mouth daily. 08/01/15   Historical Provider, MD  polyethylene glycol powder (GLYCOLAX/MIRALAX) powder Take 17 g by mouth daily as needed (For constipation.).   08/01/15   Historical Provider, MD  potassium chloride SA (K-DUR,KLOR-CON) 20 MEQ tablet TAKE 1 TABLET(20 MEQ) BY MOUTH TWICE DAILY Patient not taking: Reported on 12/14/2015 08/29/14   Heath Lark, MD  topiramate (TOPAMAX) 100 MG tablet TK 1 T PO  BID 11/16/15   Historical Provider, MD  traZODone (DESYREL) 100 MG tablet Take 100 mg by mouth at bedtime. 08/15/15   Historical Provider, MD    Family History Family History  Problem Relation Age of Onset  . Cancer Maternal Grandmother     skin cancer    Social History Social History  Substance Use Topics  . Smoking status: Former Smoker    Packs/day: 1.00    Years: 15.00    Quit date: 09/11/2010  . Smokeless tobacco: Never Used  . Alcohol use No     Allergies   Patient has no known allergies.   Review of Systems Review of Systems  Constitutional: Positive for appetite change (decreased) and fatigue. Negative for chills and fever.  HENT: Positive for rhinorrhea. Negative for drooling, ear discharge, ear pain, mouth sores, sore throat and  trouble swallowing.   Respiratory: Positive for cough and wheezing. Negative for shortness of breath.   Cardiovascular: Negative for chest pain.  Gastrointestinal: Negative for abdominal pain, blood in stool, constipation, diarrhea, nausea and vomiting.  Genitourinary: Positive for dysuria. Negative for hematuria.  Musculoskeletal: Positive for arthralgias (R rib cage). Negative for myalgias.  Skin: Negative for color change.  Allergic/Immunologic: Negative for immunocompromised state.  Neurological: Positive for dizziness and light-headedness (with standing). Negative for weakness and numbness.  Psychiatric/Behavioral: Negative for confusion.   10 Systems reviewed and are negative for acute change except as noted in the HPI.   Physical Exam Updated Vital Signs BP 108/86 (BP Location: Right Arm)   Pulse 93   Temp 97.5 F (36.4 C) (Oral)   Resp 23   SpO2 98%   Physical Exam  Constitutional: She  is oriented to person, place, and time. Vital signs are normal. She appears well-developed and well-nourished. She appears cachectic.  Non-toxic appearance. No distress.  Afebrile, nontoxic, NAD, cachectic, appears much older than stated age  HENT:  Head: Normocephalic and atraumatic.  Nose: Mucosal edema and rhinorrhea present.  Mouth/Throat: Uvula is midline and oropharynx is clear and moist. Mucous membranes are dry. No oral lesions. No trismus in the jaw. No uvula swelling.  Dry mucous membranes. Nose with mild rhinorrhea and mucosal edema. Oropharynx clear, no oral lesions, without uvular swelling or deviation, no trismus or drooling, no tonsillar swelling or erythema, no exudates.    Eyes: Conjunctivae and EOM are normal. Pupils are equal, round, and reactive to light. Right eye exhibits no discharge. Left eye exhibits no discharge.  PERRL, EOMI, no nystagmus, no visual field deficits   Neck: Normal range of motion. Neck supple. No spinous process tenderness and no muscular tenderness present. No neck rigidity. Normal range of motion present.  Trach scar with small fistula opening  Cardiovascular: Normal rate, regular rhythm, normal heart sounds and intact distal pulses.  Exam reveals no gallop and no friction rub.   No murmur heard. RRR, nl s1/s2, no m/r/g, distal pulses intact, no pedal edema. With standing, HR increases into the low 100s  Pulmonary/Chest: Effort normal. No respiratory distress. She has no decreased breath sounds. She has no wheezes. She has rhonchi. She has no rales. She exhibits tenderness. She exhibits no crepitus, no deformity and no retraction.    Poor inspiratory effort somewhat limits exam, but some scattered rhonchi heard throughout, no wheezing/rales audible, no hypoxia or increased WOB, speaking in full sentences, SpO2 98% on RA, intermittent cough during exam Chest wall with mild TTP to the R lateral ribs along mid-axillary line, without crepitus, deformities, or  retractions   Abdominal: Soft. Normal appearance and bowel sounds are normal. She exhibits no distension. There is tenderness in the suprapubic area. There is no rigidity, no rebound, no guarding, no CVA tenderness, no tenderness at McBurney's point and negative Murphy's sign.  Soft, nondistended, +BS throughout, with very mild suprapubic TTP, no r/g/r, neg murphy's, neg mcburney's, no CVA TTP   Musculoskeletal: Normal range of motion.  MAE x4 Strength and sensation grossly intact Distal pulses intact Gait steady  Neurological: She is alert and oriented to person, place, and time. She has normal strength. No cranial nerve deficit or sensory deficit. Coordination and gait normal. GCS eye subscore is 4. GCS verbal subscore is 5. GCS motor subscore is 6.  CN 2-12 grossly intact A&O x4 GCS 15 Sensation and strength intact Gait nonataxic including with tandem walking Coordination WNL  Neg pronator drift   Skin: Skin is warm, dry and intact. No rash noted.  Psychiatric: She has a normal mood and affect.  Nursing note and vitals reviewed.    ED Treatments / Results  Labs (all labs ordered are listed, but only abnormal results are displayed) Labs Reviewed  BASIC METABOLIC PANEL - Abnormal; Notable for the following:       Result Value   Sodium 132 (*)    Potassium 3.1 (*)    Chloride 98 (*)    CO2 21 (*)    All other components within normal limits  CBC - Abnormal; Notable for the following:    WBC 10.8 (*)    Hemoglobin 11.7 (*)    RDW 20.0 (*)    Platelets 736 (*)    All other components within normal limits  URINALYSIS, ROUTINE W REFLEX MICROSCOPIC (NOT AT [facility]) - Abnormal; Notable for the following:    Color, Urine AMBER (*)    APPearance TURBID (*)    Hgb urine dipstick SMALL (*)    Bilirubin Urine SMALL (*)    Ketones, ur 15 (*)    Protein, ur 30 (*)    Nitrite POSITIVE (*)    Leukocytes, UA LARGE (*)    All other components within normal limits  DIFFERENTIAL - Abnormal;  Notable for the following:    Neutro Abs 9.6 (*)    Lymphs Abs 0.5 (*)    All other components within normal limits  URINE MICROSCOPIC-ADD ON - Abnormal; Notable for the following:    Squamous Epithelial / LPF 0-5 (*)    Bacteria, UA MANY (*)    Casts HYALINE CASTS (*)    All other components within normal limits  URINE CULTURE  CBG MONITORING, ED  I-STAT TROPOININ, ED  I-STAT TROPOININ, ED    EKG  EKG Interpretation  Date/Time:  Sunday March 17 2016 14:36:30 EST Ventricular Rate:  92 PR Interval:    QRS Duration: 81 QT Interval:  384 QTC Calculation: 475 R Axis:   73 Text Interpretation:  Sinus rhythm Ventricular premature complex Borderline T abnormalities, lateral leads , improved since prior ECG Confirmed by KNAPP  MD-J, JON [id number]) on 03/17/2016 3:17:06 PM       Radiology Dg Chest 2 View  Result Date: 03/17/2016 CLINICAL DATA:  RIGHT side chest pain with cough productive of green material for 3 weeks, history of throat cancer 5 years ago, smoking history EXAM: CHEST  2 VIEW COMPARISON:  09/05/2014 FINDINGS: Tracheostomy tube no longer identified. Normal heart size, mediastinal contours, and pulmonary vascularity. Numerous calcified granulomata throughout both lungs greater on LEFT. Consolidation at lower RIGHT lung, in RIGHT lower lobe and questionably within RIGHT middle lobe as well consistent with pneumonia. Small cavitary focus suspected within the RIGHT lower lobe infiltrate. Remaining lungs clear emphysematous but clear. No pleural effusion or pneumothorax. Severe biconvex thoracic scoliosis. IMPRESSION: Emphysematous and granulomatous disease changes with new infiltrates at the RIGHT lung base consistent with pneumonia. Suspected cavitation within the RIGHT lower lobe consolidation; CT chest with contrast recommended to exclude lung abscess and necrotic tumor in this patient with a history of throat cancer. Electronically Signed   By: Lavonia Dana M.D.   On: 03/17/2016  15:57   Ct Chest W Contrast  Result Date: 03/17/2016 CLINICAL DATA:  Three-week history of productive cough, fatigue and lack of appetite. Abnormal chest x-ray. EXAM: CT CHEST WITH CONTRAST TECHNIQUE: Multidetector CT imaging of the chest was performed during intravenous contrast  administration. CONTRAST:  47mL ISOVUE-300 IOPAMIDOL (ISOVUE-300) INJECTION 61% COMPARISON:  Chest x-ray 03/17/2016 and prior chest CT 08/26/2014 FINDINGS: Chest wall: No breast masses, supraclavicular or axillary lymphadenopathy. Small scattered lymph nodes are noted. The thyroid gland is grossly normal. Cardiovascular: The heart is normal in size given the pectus deformity. No change since prior study. Mild mass effect on the right ventricle. The aorta is normal in caliber. No dissection. The branch vessels are patent. No coronary artery calcifications. Mediastinum/Nodes: No mediastinal or hilar mass or lymphadenopathy. Small scattered lymph nodes are noted. The esophagus is grossly normal. There are calcified mediastinal and hilar lymph nodes along with calcified granulomas in both lungs. Lungs/Pleura: Extensive right lower lobe pneumonia with associated lung abscesses. The left lung is clear. No pleural effusions. No worrisome pulmonary lesions. Upper Abdomen: No significant upper abdominal findings. Musculoskeletal: Significant scoliosis.  No bone lesions. IMPRESSION: 1. Right lower lobe infiltrates and associated abscesses. This is likely an aggressive or atypical pneumonia. 2. Evidence of prior granulomatous disease with numerous calcified mediastinal and hilar lymph nodes and calcified granulomas in both lungs. 3. No mediastinal or hilar mass or lymphadenopathy. 4. No significant upper abdominal findings. Electronically Signed   By: Marijo Sanes M.D.   On: 03/17/2016 17:56    Procedures Procedures (including critical care time)  Medications Ordered in ED Medications  azithromycin (ZITHROMAX) 500 mg in dextrose 5 % 250  mL IVPB (500 mg Intravenous New Bag/Given 03/17/16 1810)  morphine 4 MG/ML injection 4 mg (not administered)  albuterol (PROVENTIL) (2.5 MG/3ML) 0.083% nebulizer solution 5 mg (5 mg Nebulization Given 03/17/16 1557)  ipratropium (ATROVENT) nebulizer solution 0.5 mg (0.5 mg Nebulization Given 03/17/16 1557)  predniSONE (DELTASONE) tablet 60 mg (60 mg Oral Given 03/17/16 1554)  potassium chloride SA (K-DUR,KLOR-CON) CR tablet 60 mEq (60 mEq Oral Given 03/17/16 1548)  sodium chloride 0.9 % bolus 2,000 mL (2,000 mLs Intravenous New Bag/Given 03/17/16 1509)  cefTRIAXone (ROCEPHIN) 1 g in dextrose 5 % 50 mL IVPB (0 g Intravenous Stopped 03/17/16 1814)  iopamidol (ISOVUE-300) 61 % injection (75 mLs  Contrast Given 03/17/16 1705)     Initial Impression / Assessment and Plan / ED Course  I have reviewed the triage vital signs and the nursing notes.  Pertinent labs & imaging results that were available during my care of the patient were reviewed by me and considered in my medical decision making (see chart for details).  Clinical Course     40 y.o. female here with cough x3 wks, decreased appetite, fatigue, and orthostatic lightheadedness. Finished azithromycin last week for her ?bronchitis, and symptoms have persisted. Also having some R rib pain with coughing, reproducible on exam, and some mild dysuria. Pt very cachectic and frail appearing, appears older than stated age, dry mucous membranes, lung exam with some scattered rhonchi but no focal consolidative lung sounds, no definite wheezing; mild suprapubic TTP but nonperitoneal abd exam. No tachycardia or hypoxia, doubt PE as etiology for her rib pain, likely just musculoskeletal rib pain from coughing. Orthostatic tachycardia noted during exam, likely dehydrated which is causing lightheadedness. BMP shows K 3.1, will replete here. CBC with WBC 10.8 but appears hemoconcentrated since her H/H is higher than prior values, will add-on differential. Plt count 736,  much higher than in the past, but could be acute phase reactant. Will add-on trop and EKG in addition to the differential counts. Awaiting CXR and U/A. Will give fluids, prednisone, duoneb, and Kdur, then reassess shortly. Of note, TSH just  checked in Aug 2017, and was 3.011 (WNL); doubt need to recheck emergently today but PCP may want to consider checking this if the fatigue persists as an outpatient.  4:23 PM Differential still in process. Trop neg. EKG without acute ischemic findings, a few PVCs noted but otherwise unchanged from prior. CXR showing emphysematous and granulomatous changes with RLL infiltrate c/w PNA, but also suspected cavitation in RLL which recommends CT chest w/ contrast to exclude lung abscess and necrotic tumor. Lung sounds slightly improved after nebs, pt feeling a little better, no requests at this time, doubt need for repeat duoneb. Will start abx for PNA now. U/A still not done, pt will provide one now. Will reassess shortly  6:18 PM Differential showing neutrophilic predominance. U/A with +nitrites, large leuks, TNTC WBC, and many bacteria, consistent with UTI; abx for pneumonia would also cover UTI, but will send for culture. CT chest showing RLL infiltrates with associated abscesses as well as several calcified mediastinal and hilar lymph nodes and granulomas. Will consult for admission. Pt requesting pain medication, will give morphine now. Pt and family understand and agree with plan.  6:30 PM Dr. Eulas Post of [facility] returning page and will admit but wants me to consult pulmonology to discuss whether she needs to be on isolation or not, and for them to consult on her while she's here. Will await their return call before putting in bed requests (IP med-surg). Will reassess shortly.   6:48 PM Dr. Oletta Darter of pulm returning page, will see in consult; feels respiratory isolation is reasonable, order placed. Holding orders placed. Please see their notes for further documentation of  care. I appreciate their help with this pleasant pt's care. Pt stable at time of admission.   Final Clinical Impressions(s) / ED Diagnoses   Final diagnoses:  Fatigue, unspecified type  Cough  Acute URI  Dehydration  Hypokalemia  Thrombocytosis (HCC)  Orthostatic lightheadedness  Chronic anemia  Community acquired pneumonia of right lower lobe of lung ([place])  Right-sided chest wall pain  Neutrophilic leukocytosis  Acute UTI  Abscess of lower lobe of right lung with pneumonia [facility])    New Prescriptions New Prescriptions   No medications on file     Belknap Camprubi-Soms, PA-C 03/17/16 [place], MD 03/19/16 [phone]

## 2016-03-17 NOTE — Progress Notes (Signed)
Pharmacy Antibiotic Note  Crystal Spence is a 40 y.o. female admitted on 03/17/2016 with pneumonia and bacteremia.  Pharmacy has been consulted for Vancomycin and Unasyn dosing.  Plan: Unasyn 1.5 grams iv Q 6 hours Vancomycin 1 gram iv Q 24 hours  Weight: 95 lb 14.4 oz (43.5 kg)  Temp (24hrs), Avg:97.4 F (36.3 C), Min:97.2 F (36.2 C), Max:97.5 F (36.4 C)   Recent Labs Lab 03/17/16 1353  WBC 10.8*  CREATININE 0.92    Estimated Creatinine Clearance: 54.7 mL/min (by C-G formula based on SCr of 0.92 mg/dL).    No Known Allergies   Thank you for allowing pharmacy to be a part of this patient's care.  Tad Moore 03/17/2016 10:22 PM

## 2016-03-18 ENCOUNTER — Inpatient Hospital Stay (HOSPITAL_COMMUNITY): Payer: Medicaid Other

## 2016-03-18 DIAGNOSIS — I959 Hypotension, unspecified: Secondary | ICD-10-CM

## 2016-03-18 DIAGNOSIS — Z9221 Personal history of antineoplastic chemotherapy: Secondary | ICD-10-CM

## 2016-03-18 DIAGNOSIS — Z87891 Personal history of nicotine dependence: Secondary | ICD-10-CM

## 2016-03-18 DIAGNOSIS — R0902 Hypoxemia: Secondary | ICD-10-CM

## 2016-03-18 DIAGNOSIS — J181 Lobar pneumonia, unspecified organism: Secondary | ICD-10-CM

## 2016-03-18 DIAGNOSIS — J851 Abscess of lung with pneumonia: Principal | ICD-10-CM

## 2016-03-18 DIAGNOSIS — Z8521 Personal history of malignant neoplasm of larynx: Secondary | ICD-10-CM

## 2016-03-18 DIAGNOSIS — B9689 Other specified bacterial agents as the cause of diseases classified elsewhere: Secondary | ICD-10-CM

## 2016-03-18 DIAGNOSIS — K089 Disorder of teeth and supporting structures, unspecified: Secondary | ICD-10-CM

## 2016-03-18 DIAGNOSIS — Z923 Personal history of irradiation: Secondary | ICD-10-CM

## 2016-03-18 LAB — COMPREHENSIVE METABOLIC PANEL
ALBUMIN: 2.1 g/dL — AB (ref 3.5–5.0)
ALT: 14 U/L (ref 14–54)
AST: 17 U/L (ref 15–41)
Alkaline Phosphatase: 285 U/L — ABNORMAL HIGH (ref 38–126)
Anion gap: 7 (ref 5–15)
BUN: 5 mg/dL — AB (ref 6–20)
CHLORIDE: 106 mmol/L (ref 101–111)
CO2: 21 mmol/L — AB (ref 22–32)
CREATININE: 0.67 mg/dL (ref 0.44–1.00)
Calcium: 8.1 mg/dL — ABNORMAL LOW (ref 8.9–10.3)
GFR calc Af Amer: >60 mL/min (ref >60)
GFR calc non Af Amer: >60 mL/min (ref >60)
GLUCOSE: 110 mg/dL — AB (ref 65–99)
POTASSIUM: 3.8 mmol/L (ref 3.5–5.1)
SODIUM: 134 mmol/L — AB (ref 135–145)
Total Bilirubin: 0.5 mg/dL (ref 0.3–1.2)
Total Protein: 5.5 g/dL — ABNORMAL LOW (ref 6.5–8.1)

## 2016-03-18 LAB — RESPIRATORY PANEL BY PCR
ADENOVIRUS-RVPPCR: NOT DETECTED
BORDETELLA PERTUSSIS-RVPCR: NOT DETECTED
CORONAVIRUS 229E-RVPPCR: NOT DETECTED
CORONAVIRUS HKU1-RVPPCR: NOT DETECTED
CORONAVIRUS NL63-RVPPCR: NOT DETECTED
Chlamydophila pneumoniae: NOT DETECTED
Coronavirus OC43: NOT DETECTED
Influenza A: NOT DETECTED
Influenza B: NOT DETECTED
METAPNEUMOVIRUS-RVPPCR: NOT DETECTED
Mycoplasma pneumoniae: NOT DETECTED
PARAINFLUENZA VIRUS 2-RVPPCR: NOT DETECTED
Parainfluenza Virus 1: NOT DETECTED
Parainfluenza Virus 3: NOT DETECTED
Parainfluenza Virus 4: NOT DETECTED
Respiratory Syncytial Virus: NOT DETECTED
Rhinovirus / Enterovirus: NOT DETECTED

## 2016-03-18 LAB — EXPECTORATED SPUTUM ASSESSMENT W GRAM STAIN, RFLX TO RESP C

## 2016-03-18 LAB — HEPATIC FUNCTION PANEL
ALK PHOS: 344 U/L — AB (ref 38–126)
ALT: 17 U/L (ref 14–54)
AST: 20 U/L (ref 15–41)
Albumin: 2.5 g/dL — ABNORMAL LOW (ref 3.5–5.0)
BILIRUBIN INDIRECT: 0.4 mg/dL (ref 0.3–0.9)
BILIRUBIN TOTAL: 0.6 mg/dL (ref 0.3–1.2)
Bilirubin, Direct: 0.2 mg/dL (ref 0.1–0.5)
TOTAL PROTEIN: 6.2 g/dL — AB (ref 6.5–8.1)

## 2016-03-18 LAB — CBC
HCT: 31.1 % — ABNORMAL LOW (ref 36.0–46.0)
Hemoglobin: 9.7 g/dL — ABNORMAL LOW (ref 12.0–15.0)
MCH: 29 pg (ref 26.0–34.0)
MCHC: 31.2 g/dL (ref 30.0–36.0)
MCV: 92.8 fL (ref 78.0–100.0)
PLATELETS: 571 10*3/uL — AB (ref 150–400)
RBC: 3.35 MIL/uL — AB (ref 3.87–5.11)
RDW: 20.6 % — AB (ref 11.5–15.5)
WBC: 6.9 10*3/uL (ref 4.0–10.5)

## 2016-03-18 LAB — HIV ANTIBODY (ROUTINE TESTING W REFLEX): HIV Screen 4th Generation wRfx: NONREACTIVE

## 2016-03-18 LAB — LACTIC ACID, PLASMA: LACTIC ACID, VENOUS: 1.9 mmol/L (ref 0.5–1.9)

## 2016-03-18 LAB — TSH: TSH: 51.049 u[IU]/mL — AB (ref 0.350–4.500)

## 2016-03-18 LAB — STREP PNEUMONIAE URINARY ANTIGEN: STREP PNEUMO URINARY ANTIGEN: NEGATIVE

## 2016-03-18 LAB — T4, FREE: Free T4: 0.53 ng/dL — ABNORMAL LOW (ref 0.61–1.12)

## 2016-03-18 LAB — EXPECTORATED SPUTUM ASSESSMENT W REFEX TO RESP CULTURE

## 2016-03-18 LAB — CORTISOL: Cortisol, Plasma: 6.1 ug/dL

## 2016-03-18 MED ORDER — SODIUM CHLORIDE 0.9 % IV BOLUS (SEPSIS)
1000.0000 mL | Freq: Once | INTRAVENOUS | Status: AC
  Administered 2016-03-18: 1000 mL via INTRAVENOUS

## 2016-03-18 MED ORDER — DEXAMETHASONE SODIUM PHOSPHATE 4 MG/ML IJ SOLN
4.0000 mg | Freq: Once | INTRAMUSCULAR | Status: AC
  Administered 2016-03-18: 4 mg via INTRAVENOUS
  Filled 2016-03-18: qty 1

## 2016-03-18 MED ORDER — LEVOTHYROXINE SODIUM 100 MCG IV SOLR
50.0000 ug | Freq: Every day | INTRAVENOUS | Status: DC
  Administered 2016-03-18 – 2016-03-20 (×3): 50 ug via INTRAVENOUS
  Filled 2016-03-18 (×3): qty 5

## 2016-03-18 MED ORDER — IBUPROFEN 400 MG PO TABS
800.0000 mg | ORAL_TABLET | Freq: Once | ORAL | Status: AC
  Administered 2016-03-18: 800 mg via ORAL
  Filled 2016-03-18: qty 2

## 2016-03-18 MED ORDER — KETOROLAC TROMETHAMINE 15 MG/ML IJ SOLN
15.0000 mg | Freq: Four times a day (QID) | INTRAMUSCULAR | Status: DC | PRN
  Administered 2016-03-18 – 2016-03-19 (×2): 15 mg via INTRAVENOUS
  Filled 2016-03-18 (×2): qty 1

## 2016-03-18 MED ORDER — ZOLPIDEM TARTRATE 5 MG PO TABS
5.0000 mg | ORAL_TABLET | Freq: Every evening | ORAL | Status: DC | PRN
  Administered 2016-03-18 – 2016-03-19 (×3): 5 mg via ORAL
  Filled 2016-03-18 (×3): qty 1

## 2016-03-18 MED ORDER — COSYNTROPIN 0.25 MG IJ SOLR
0.2500 mg | Freq: Once | INTRAMUSCULAR | Status: AC
  Administered 2016-03-19: 0.25 mg via INTRAVENOUS
  Filled 2016-03-18: qty 0.25

## 2016-03-18 NOTE — Consult Note (Signed)
[facility] for Infectious Disease       Reason for Consult: pulmonary abscess    Referring Physician: Dr. [place] Pink  Principal Problem:   Pulmonary abscess [facility]) Active Problems:   Hypokalemia   History of laryngeal cancer   Hypothyroidism (acquired)   Community acquired pneumonia of right lower lobe of lung ([place])   Dehydration   . ampicillin-sulbactam (UNASYN) IV  1.5 g Intravenous Q6H  . feeding supplement (ENSURE ENLIVE)  237 mL Oral BID BM  . guaiFENesin  600 mg Oral BID  . levothyroxine  50 mcg Intravenous Daily  . pantoprazole  40 mg Oral Daily  . potassium chloride SA  10 mEq Oral BID  . sodium chloride  1,000 mL Intravenous Once  . vancomycin  1,000 mg Intravenous Q24H    Recommendations: Continue vancomycin and unasyn waiting for culture Will check routine HIV    Assessment: She has two areas of abscess most typically from aspiration vs MRSA.  Also placed on airborne isolation per pulmonary for ? Tb though no particular risk factors.  I will defer to pulmonary if continued respiratory isolation indicated.     Antibiotics: Vancomycin and unasyn  HPI: Crystal Spence is a 40 y.o. female with a history of laryngeal cancer previously treated with radiation and chemotherapy in 2012, previous tracheostomy who presented last night with 3 weeks of productive cough, fever, chills, fatigue and CT chest c/w pulmonary abscess.  No recent travel, no sick contacts.  Did receive azithromycin.   CT chest independently reviewed and cavitary lesions noted.    Review of Systems:  Constitutional: negative for weight loss Gastrointestinal: negative for diarrhea Integument/breast: negative for rash Musculoskeletal: negative for myalgias All other systems reviewed and are negative    Past Medical History:  Diagnosis Date  . Anemia, unspecified 06/17/2013  . History of laryngeal cancer 02/03/2013  . Hypertension   . Hypothyroidism (acquired) 06/17/2013  . Multiple lung  nodules 06/28/2014  . Pneumonia 01/14/2014  . Poor oral hygiene 06/17/2013  . Rib pain 01/14/2014  . Rib pain on left side 01/05/2014  . Throat cancer [facility])     Social History  Substance Use Topics  . Smoking status: Former Smoker    Packs/day: 1.00    Years: 15.00    Quit date: 09/11/2010  . Smokeless tobacco: Never Used  . Alcohol use No    Family History  Problem Relation Age of Onset  . Cancer Maternal Grandmother     skin cancer    No Known Allergies  Physical Exam: Constitutional: in no apparent distress  Vitals:   03/17/16 2323 03/18/16 0657  BP: (!) 88/65 (!) 83/64  Pulse: 73 65  Resp: 18 18  Temp: 97.4 F (36.3 C) 97.4 F (36.3 C)   EYES: anicteric ENMT: no thrush Cardiovascular: Cor RRR Respiratory: CTA B; GI: soft, nt, nd Musculoskeletal: no pedal edema noted Skin: negatives: no rash Hematologic: no cervical lad  Lab Results  Component Value Date   WBC 6.9 03/18/2016   HGB 9.7 (L) 03/18/2016   HCT 31.1 (L) 03/18/2016   MCV 92.8 03/18/2016   PLT 571 (H) 03/18/2016    Lab Results  Component Value Date   CREATININE 0.67 03/18/2016   BUN 5 (L) 03/18/2016   NA 134 (L) 03/18/2016   K 3.8 03/18/2016   CL 106 03/18/2016   CO2 21 (L) 03/18/2016    Lab Results  Component Value Date   ALT 14 03/18/2016   AST  17 03/18/2016   ALKPHOS 285 (H) 03/18/2016     Microbiology: Recent Results (from the past 240 hour(s))  Culture, blood (routine x 2) Call MD if unable to obtain prior to antibiotics being given     Status: None (Preliminary result)   Collection Time: 03/17/16 10:56 PM  Result Value Ref Range Status   Specimen Description BLOOD LEFT HAND  Final   Special Requests BOTTLES DRAWN AEROBIC AND ANAEROBIC 5CC EA  Final   Culture PENDING  Incomplete   Report Status PENDING  Incomplete    Scharlene Gloss, [place] for Infectious Disease [facility] [email or web address] [phone] pager  [phone] cell 03/18/2016, 8:22 AM

## 2016-03-18 NOTE — Progress Notes (Signed)
[facility] PROGRESS NOTE  MADELL RATZEL [id number] DOB: 1975-06-08 DOA: 03/17/2016  PCP: Benito Mccreedy, MD  Brief History/Interval Summary: 40 year old Caucasian female with a past medical history of laryngeal cancer treated with chemotherapy and radiation in 2012, hypertension, hypothyroidism, who seems to be noncompliant with her medications at home. She was brought into the hospital due to three-week history of cough, fever, chills, weakness and fatigue. She was found to have pneumonia in the right lung, with concern for cavitary lesion or abscess. Patient was hospitalized for further management.  Reason for Visit: Pneumonia with possible cavitary lesion  Consultants: Infectious disease. Pulmonology.  Procedures: None So far  Antibiotics: Unasyn and vancomycin  Subjective/Interval History: Patient the states that she has a lot of pain in her chest when she coughs. She admits to some dizziness but denies any syncopal episode. Feels short of breath at times. States that she hasn't taken any of her medications in a while, although she was not able to specify any further.  ROS: Denies any nausea or vomiting  Objective:  Vital Signs  Vitals:   03/17/16 2200 03/17/16 2323 03/18/16 0657 03/18/16 0900  BP:  (!) 88/65 (!) 83/64 (!) 88/65  Pulse:  73 65 68  Resp:  18 18 18   Temp:  97.4 F (36.3 C) 97.4 F (36.3 C) 98.7 F (37.1 C)  TempSrc:  Axillary Oral Oral  SpO2:  100% 100% 100%  Weight: 43.5 kg (95 lb 14.4 oz)     Height:    5\' 3"  (1.6 m)    Intake/Output Summary (Last 24 hours) at 03/18/16 1137 Last data filed at 03/18/16 1000  Gross per 24 hour  Intake          1813.33 ml  Output                0 ml  Net          1813.33 ml   Filed Weights   03/17/16 2200  Weight: 43.5 kg (95 lb 14.4 oz)    General appearance: alert, cooperative, appears stated age and no distress Head: Normocephalic, without obvious abnormality, atraumatic Resp:  Diminished air entry at the bases with a few crackles in the right base. No wheezing. No rhonchi. Cardio: regular rate and rhythm, S1, S2 normal, no murmur, click, rub or gallop GI: soft, non-tender; bowel sounds normal; no masses,  no organomegaly Extremities: extremities normal, atraumatic, no cyanosis or edema Neurologic: Awake and alert. Oriented 3. Slow to respond at times. But no focal neurological deficits are noted.  Lab Results:  Data Reviewed: I have personally reviewed following labs and imaging studies  CBC:  Recent Labs Lab 03/17/16 1353 03/18/16 0637  WBC 10.8* 6.9  NEUTROABS 9.6*  --   HGB 11.7* 9.7*  HCT 36.6 31.1*  MCV 91.5 92.8  PLT 736* 571*    Basic Metabolic Panel:  Recent Labs Lab 03/17/16 1353 03/18/16 0637  NA 132* 134*  K 3.1* 3.8  CL 98* 106  CO2 21* 21*  GLUCOSE 85 110*  BUN 9 5*  CREATININE 0.92 0.67  CALCIUM 9.9 8.1*    GFR: Estimated Creatinine Clearance: 62.9 mL/min (by C-G formula based on SCr of 0.67 mg/dL).  Liver Function Tests:  Recent Labs Lab 03/17/16 2256 03/18/16 0637  AST 20 17  ALT 17 14  ALKPHOS 344* 285*  BILITOT 0.6 0.5  PROT 6.2* 5.5*  ALBUMIN 2.5* 2.1*    Coagulation Profile:  Recent Labs Lab 03/17/16 2256  INR 1.00    CBG:  Recent Labs Lab 03/17/16 1351  GLUCAP 93    Thyroid Function Tests:  Recent Labs  03/17/16 2256  TSH 51.049*  FREET4 0.53*    Anemia Panel: No results for input(s): VITAMINB12, FOLATE, FERRITIN, TIBC, IRON, RETICCTPCT in the last 72 hours.  Recent Results (from the past 240 hour(s))  Culture, blood (routine x 2) Call MD if unable to obtain prior to antibiotics being given     Status: None (Preliminary result)   Collection Time: 03/17/16 10:56 PM  Result Value Ref Range Status   Specimen Description BLOOD LEFT HAND  Final   Special Requests BOTTLES DRAWN AEROBIC AND ANAEROBIC 5CC EA  Final   Culture PENDING  Incomplete   Report Status PENDING  Incomplete    Respiratory Panel by PCR     Status: None   Collection Time: 03/18/16 12:40 AM  Result Value Ref Range Status   Adenovirus NOT DETECTED NOT DETECTED Final   Coronavirus 229E NOT DETECTED NOT DETECTED Final   Coronavirus HKU1 NOT DETECTED NOT DETECTED Final   Coronavirus NL63 NOT DETECTED NOT DETECTED Final   Coronavirus OC43 NOT DETECTED NOT DETECTED Final   Metapneumovirus NOT DETECTED NOT DETECTED Final   Rhinovirus / Enterovirus NOT DETECTED NOT DETECTED Final   Influenza A NOT DETECTED NOT DETECTED Final   Influenza B NOT DETECTED NOT DETECTED Final   Parainfluenza Virus 1 NOT DETECTED NOT DETECTED Final   Parainfluenza Virus 2 NOT DETECTED NOT DETECTED Final   Parainfluenza Virus 3 NOT DETECTED NOT DETECTED Final   Parainfluenza Virus 4 NOT DETECTED NOT DETECTED Final   Respiratory Syncytial Virus NOT DETECTED NOT DETECTED Final   Bordetella pertussis NOT DETECTED NOT DETECTED Final   Chlamydophila pneumoniae NOT DETECTED NOT DETECTED Final   Mycoplasma pneumoniae NOT DETECTED NOT DETECTED Final      Radiology Studies: Dg Chest 2 View  Result Date: 03/17/2016 CLINICAL DATA:  RIGHT side chest pain with cough productive of green material for 3 weeks, history of throat cancer 5 years ago, smoking history EXAM: CHEST  2 VIEW COMPARISON:  09/05/2014 FINDINGS: Tracheostomy tube no longer identified. Normal heart size, mediastinal contours, and pulmonary vascularity. Numerous calcified granulomata throughout both lungs greater on LEFT. Consolidation at lower RIGHT lung, in RIGHT lower lobe and questionably within RIGHT middle lobe as well consistent with pneumonia. Small cavitary focus suspected within the RIGHT lower lobe infiltrate. Remaining lungs clear emphysematous but clear. No pleural effusion or pneumothorax. Severe biconvex thoracic scoliosis. IMPRESSION: Emphysematous and granulomatous disease changes with new infiltrates at the RIGHT lung base consistent with pneumonia. Suspected  cavitation within the RIGHT lower lobe consolidation; CT chest with contrast recommended to exclude lung abscess and necrotic tumor in this patient with a history of throat cancer. Electronically Signed   By: Lavonia Dana M.D.   On: 03/17/2016 15:57   Ct Chest W Contrast  Result Date: 03/17/2016 CLINICAL DATA:  Three-week history of productive cough, fatigue and lack of appetite. Abnormal chest x-ray. EXAM: CT CHEST WITH CONTRAST TECHNIQUE: Multidetector CT imaging of the chest was performed during intravenous contrast administration. CONTRAST:  57mL ISOVUE-300 IOPAMIDOL (ISOVUE-300) INJECTION 61% COMPARISON:  Chest x-ray 03/17/2016 and prior chest CT 08/26/2014 FINDINGS: Chest wall: No breast masses, supraclavicular or axillary lymphadenopathy. Small scattered lymph nodes are noted. The thyroid gland is grossly normal. Cardiovascular: The heart is normal in size given the pectus deformity. No change since prior study. Mild mass effect on the right  ventricle. The aorta is normal in caliber. No dissection. The branch vessels are patent. No coronary artery calcifications. Mediastinum/Nodes: No mediastinal or hilar mass or lymphadenopathy. Small scattered lymph nodes are noted. The esophagus is grossly normal. There are calcified mediastinal and hilar lymph nodes along with calcified granulomas in both lungs. Lungs/Pleura: Extensive right lower lobe pneumonia with associated lung abscesses. The left lung is clear. No pleural effusions. No worrisome pulmonary lesions. Upper Abdomen: No significant upper abdominal findings. Musculoskeletal: Significant scoliosis.  No bone lesions. IMPRESSION: 1. Right lower lobe infiltrates and associated abscesses. This is likely an aggressive or atypical pneumonia. 2. Evidence of prior granulomatous disease with numerous calcified mediastinal and hilar lymph nodes and calcified granulomas in both lungs. 3. No mediastinal or hilar mass or lymphadenopathy. 4. No significant upper  abdominal findings. Electronically Signed   By: Marijo Sanes M.D.   On: 03/17/2016 17:56     Medications:  Scheduled: . ampicillin-sulbactam (UNASYN) IV  1.5 g Intravenous Q6H  . [START ON 03/19/2016] cosyntropin  0.25 mg Intravenous Once  . dexamethasone  4 mg Intravenous Once  . feeding supplement (ENSURE ENLIVE)  237 mL Oral BID BM  . guaiFENesin  600 mg Oral BID  . levothyroxine  50 mcg Intravenous Daily  . pantoprazole  40 mg Oral Daily  . potassium chloride SA  10 mEq Oral BID  . vancomycin  1,000 mg Intravenous Q24H   Continuous: . sodium chloride 100 mL/hr at 03/17/16 2234   [id number], ketorolac, oxyCODONE-acetaminophen, zolpidem  Assessment/Plan:  Principal Problem:   Pulmonary abscess (HCC) Active Problems:   Hypokalemia   History of laryngeal cancer   Hypothyroidism (acquired)   Community acquired pneumonia of right lower lobe of lung ([place])   Dehydration    Atypical pneumonia with cavitary abscesses on CT chest Patient remains stable, although she is noted to have low blood pressures. Lactic acid level is normal. Continue vancomycin and Unasyn as recommended by infectious disease. ID is following. Pulmonology is also following. HIV is pending. Patient is also being ruled out for tuberculosis, which is thought to be less likely. Follow-up on blood cultures and sputum cultures. Pulmonary toilet.   Hypotension Patient's blood pressure is noted to be low. Heart rate is normal. She does admit to some lightheadedness. She is noted to be on antihypertensives at home, although it is not clear when was the last time she took her medicines. There is definitely a component of hypovolemia. She'll be given fluid boluses. Other possibilities include adrenal insufficiency. She was hyponatremic though her potassium level was normal. Cortisol level was checked and was noted to be low at 6.1. Cosyntropin stimulation test will be ordered. In the meantime, patient will be  given a dose of dexamethasone.  Dehydration due to decreased PO intake NS maintenance fluids. Regular diet as tolerated.  Reactive thrombocytosis Ssecondary to acute infection.  Hypothyroidism TSH is high with low T4. Patient has not been taking her thyroid medicines. Given intravenously for now.  UTI Urine culture pending.   Normocytic anemia. Drop in hemoglobin, is dilutional. No evidence for overt bleeding. Check anemia panel.  DVT Prophylaxis: SCDs    Code Status: Full code  Family Communication: Discussed with the patient  Disposition Plan: Continue management as outlined above. Await specialty input.    LOS: 1 day   [facility] Pager [phone] 03/18/2016, 11:37 AM  If 7PM-7AM, please contact night-coverage at [email or web address], password [facility]

## 2016-03-18 NOTE — Progress Notes (Signed)
New Admission Note:  Arrival Method: Stretcher Mental Orientation: Alert and oriented x 4 Telemetry: Box 18 NSR Assessment: Completed Skin: Warm and dry IV: NSL Pain: 8/10 gen  Tubes: N/a Safety Measures: Safety Fall Prevention Plan was given, discussed and signed. Admission: Completed [address] [place] Orientation: Patient has been orientated to the room, unit and the staff. Family: None  Orders have been reviewed and implemented. Will continue to monitor the patient. Call light has been placed within reach and bed alarm has been activated.   Sima Matas BSN, RN  Phone Number: [phone]

## 2016-03-18 NOTE — Progress Notes (Signed)
Name: Crystal Spence MRN: [id number] DOB: 1976-01-19    ADMISSION DATE:  03/17/2016 CONSULTATION DATE:  03/17/2016  REFERRING MD :  Dr. Eulas Post [facility]  CHIEF COMPLAINT:  Cough  BRIEF PATIENT DESCRIPTION: 40 year old female with history of laryngeal cancer s/p chemo and radiation 2012. Presented 11/5 with CC cough and dizziness, found to have cavitary PNA vs lung abscess. Admitted to hospitalist with PCCM consult.  SIGNIFICANT EVENTS  11/5 admit  STUDIES:  CT chest 11/5 >Right lower lobe infiltrates and associated abscesses. This is likely an aggressive or atypical pneumonia. Evidence of prior granulomatous disease with numerous calcified mediastinal and hilar lymph nodes and calcified granulomas in both lungs.   SUBJECTIVE:  Feels a little better  VITAL SIGNS: Temp:  [97.2 F (36.2 C)-97.5 F (36.4 C)] 97.4 F (36.3 C) (11/06 0657) Pulse Rate:  [65-102] 65 (11/06 0657) Resp:  [18-24] 18 (11/06 0657) BP: (83-108)/(64-86) 83/64 (11/06 0657) SpO2:  [95 %-100 %] 100 % (11/06 0657) Weight:  [95 lb 14.4 oz (43.5 kg)] 95 lb 14.4 oz (43.5 kg) (11/05 2200)  PHYSICAL EXAMINATION: General:  Frail cachectic middle aged female who appears weak and older than stated age, no distress Neuro:  Alert, oriented, non-focal HEENT:  [place]/AT, small tracheostomy fistula remains. PERRL, no JVD Cardiovascular:  RRR, no MRG Lungs:  Coarse throughout, diminished R base. No accessory use  Abdomen:  Soft, non-tender, non-distended Musculoskeletal: no acute deformity or ROM limitation Skin:  Grossly intact, appears jaundiced.   Recent Labs Lab 03/17/16 1353 03/18/16 0637  NA 132* 134*  K 3.1* 3.8  CL 98* 106  CO2 21* 21*  BUN 9 5*  CREATININE 0.92 0.67  GLUCOSE 85 110*    Recent Labs Lab 03/17/16 1353 03/18/16 0637  HGB 11.7* 9.7*  HCT 36.6 31.1*  WBC 10.8* 6.9  PLT 736* 571*   Dg Chest 2 View  Result Date: 03/17/2016 CLINICAL DATA:  RIGHT side chest pain with cough productive of  green material for 3 weeks, history of throat cancer 5 years ago, smoking history EXAM: CHEST  2 VIEW COMPARISON:  09/05/2014 FINDINGS: Tracheostomy tube no longer identified. Normal heart size, mediastinal contours, and pulmonary vascularity. Numerous calcified granulomata throughout both lungs greater on LEFT. Consolidation at lower RIGHT lung, in RIGHT lower lobe and questionably within RIGHT middle lobe as well consistent with pneumonia. Small cavitary focus suspected within the RIGHT lower lobe infiltrate. Remaining lungs clear emphysematous but clear. No pleural effusion or pneumothorax. Severe biconvex thoracic scoliosis. IMPRESSION: Emphysematous and granulomatous disease changes with new infiltrates at the RIGHT lung base consistent with pneumonia. Suspected cavitation within the RIGHT lower lobe consolidation; CT chest with contrast recommended to exclude lung abscess and necrotic tumor in this patient with a history of throat cancer. Electronically Signed   By: Lavonia Dana M.D.   On: 03/17/2016 15:57   Ct Chest W Contrast  Result Date: 03/17/2016 CLINICAL DATA:  Three-week history of productive cough, fatigue and lack of appetite. Abnormal chest x-ray. EXAM: CT CHEST WITH CONTRAST TECHNIQUE: Multidetector CT imaging of the chest was performed during intravenous contrast administration. CONTRAST:  98mL ISOVUE-300 IOPAMIDOL (ISOVUE-300) INJECTION 61% COMPARISON:  Chest x-ray 03/17/2016 and prior chest CT 08/26/2014 FINDINGS: Chest wall: No breast masses, supraclavicular or axillary lymphadenopathy. Small scattered lymph nodes are noted. The thyroid gland is grossly normal. Cardiovascular: The heart is normal in size given the pectus deformity. No change since prior study. Mild mass effect on the right ventricle. The aorta  is normal in caliber. No dissection. The branch vessels are patent. No coronary artery calcifications. Mediastinum/Nodes: No mediastinal or hilar mass or lymphadenopathy. Small  scattered lymph nodes are noted. The esophagus is grossly normal. There are calcified mediastinal and hilar lymph nodes along with calcified granulomas in both lungs. Lungs/Pleura: Extensive right lower lobe pneumonia with associated lung abscesses. The left lung is clear. No pleural effusions. No worrisome pulmonary lesions. Upper Abdomen: No significant upper abdominal findings. Musculoskeletal: Significant scoliosis.  No bone lesions. IMPRESSION: 1. Right lower lobe infiltrates and associated abscesses. This is likely an aggressive or atypical pneumonia. 2. Evidence of prior granulomatous disease with numerous calcified mediastinal and hilar lymph nodes and calcified granulomas in both lungs. 3. No mediastinal or hilar mass or lymphadenopathy. 4. No significant upper abdominal findings. Electronically Signed   By: Marijo Sanes M.D.   On: 03/17/2016 17:56    ASSESSMENT / PLAN:  RLL cavitary PNA/Pulmonary abscesses  Likely chronic aspiration but also consider staph PNA.  Severe periodontal disease -->suspect that she is chronically aspirating translocated bacteria from her mouth. Suspect she will require more dental extractions.   Plan: Supplemental O2 PRN to keep O2 sat > 92% Agree w/ ID-->cont unasyn/vanc F/u cultures Obtain orthopanogram  Follow CXR -->will likely need extended abx course until abscess resolved.  F/u HIV, RVP and afb Can d/c respiratory precautions once RVP back  Erick Colace ACNP-BC [place] Pager # [phone] OR # [phone] if no answer  Attending Note:  40 year old female with very poor dentition presenting to PCCM with two distinct pulmonary abscesses.  On exam, dentition is very poor and malodorous.  I reviewed CT myself, one formed and one forming abscesses in the RLL noted indicating aspiration is more likely.  Discussed with PCCM-NP.  Pulmonary abscess:  - Continue unasyn and vanc.  - HIV testing  - F/u on cultures.  - No surgical  interventions needed at this time.  - Will need abx until closure of abscesses are noted radiographically.  Poor dentition:  - Panoramic dental x-ray ordered for ?abscess.  - Will need multiple extractions once out of the hospital  Hypoxemia:  - Titrate O2 for sat of 92-95%.  - Doubt will need home O2.  Aspiration Pneumonia:  - F/U on cultures.  - Continue unasyn and vanc.  Respiratory isolation  - RVP pending, if negative may D/C isolation.  PCCM will follow.  Patient seen and examined, agree with above note.  I dictated the care and orders written for this patient under my direction.  Rush Farmer, MD [phone]

## 2016-03-18 NOTE — Progress Notes (Signed)
Initial Nutrition Assessment  DOCUMENTATION CODES:   Severe malnutrition in context of acute illness/injury, Underweight  INTERVENTION:  Continue Ensure Enlive po BID, each supplement provides 350 kcal and 20 grams of protein.  Monitor magnesium, potassium, and phosphorus daily for at least 3 days, MD to replete as needed, as pt is at risk for refeeding syndrome given severe malnutrition and prolonged poor po intake for 3 weeks.  RD to continue to monitor.   NUTRITION DIAGNOSIS:   Malnutrition related to acute illness as evidenced by energy intake < or equal to 50% for > or equal to 5 days, severe depletion of body fat, severe depletion of muscle mass.  GOAL:   Patient will meet greater than or equal to 90% of their needs  MONITOR:   PO intake, Supplement acceptance, Labs, Weight trends, Skin, I & O's  REASON FOR ASSESSMENT:   Malnutrition Screening Tool    ASSESSMENT:   40 y.o. 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  Meal completion has been 50%. Pt reports having a lack of appetite with poor po intake that has been ongoing over the past 3 weeks. Pt reports she has been only able to a take a couple of bites out of food over the past 3 weeks. Per weight records, weight has been stable. Pt does reports weight last week was 87 lbs. Pt is at risk for refeeding syndrome due to severe malnutrition and prolonged poor po intake x 3 weeks. Pt currently has Ensure ordered and would like to continue with them. Family at bedside has been encouraging her to eat at meals.   Nutrition-Focused physical exam completed. Findings are severe fat depletion, severe muscle depletion, and no edema.   Labs and medications reviewed.    Diet Order:  Diet regular Room service appropriate? Yes; Fluid consistency: Thin  Skin:  Wound (see comment) (wound on throat)  Last BM:  11/4  Height:   Ht Readings from Last 1 Encounters:  03/18/16 5\' 3"  (1.6 m)    Weight:   Wt Readings from Last 1 Encounters:  03/17/16 95 lb 14.4 oz (43.5 kg)    Ideal Body Weight:  52.27 kg  BMI:  Body mass index is 16.99 kg/m.  Estimated Nutritional Needs:   Kcal:  1550-1800  Protein:  65-80 grams  Fluid:  >/= 1.5 L/day  EDUCATION NEEDS:   No education needs identified at this time  Corrin Parker, MS, RD, LDN Pager # [phone] After hours/ weekend pager # [phone]

## 2016-03-19 DIAGNOSIS — D75839 Thrombocytosis, unspecified: Secondary | ICD-10-CM

## 2016-03-19 DIAGNOSIS — D473 Essential (hemorrhagic) thrombocythemia: Secondary | ICD-10-CM

## 2016-03-19 DIAGNOSIS — D71 Functional disorders of polymorphonuclear neutrophils: Secondary | ICD-10-CM

## 2016-03-19 DIAGNOSIS — J852 Abscess of lung without pneumonia: Secondary | ICD-10-CM

## 2016-03-19 DIAGNOSIS — N390 Urinary tract infection, site not specified: Secondary | ICD-10-CM

## 2016-03-19 DIAGNOSIS — D649 Anemia, unspecified: Secondary | ICD-10-CM

## 2016-03-19 DIAGNOSIS — Z8701 Personal history of pneumonia (recurrent): Secondary | ICD-10-CM

## 2016-03-19 DIAGNOSIS — E43 Unspecified severe protein-calorie malnutrition: Secondary | ICD-10-CM | POA: Insufficient documentation

## 2016-03-19 LAB — ACTH STIMULATION, 3 TIME POINTS
CORTISOL BASE: 2.9 ug/dL
Cortisol, 30 Min: 15.9 ug/dL
Cortisol, 60 Min: 19.8 ug/dL

## 2016-03-19 LAB — COMPREHENSIVE METABOLIC PANEL
ALK PHOS: 263 U/L — AB (ref 38–126)
ALT: 13 U/L — AB (ref 14–54)
AST: 16 U/L (ref 15–41)
Albumin: 2.1 g/dL — ABNORMAL LOW (ref 3.5–5.0)
Anion gap: 6 (ref 5–15)
BUN: 9 mg/dL (ref 6–20)
CALCIUM: 8.5 mg/dL — AB (ref 8.9–10.3)
CO2: 19 mmol/L — ABNORMAL LOW (ref 22–32)
CREATININE: 0.74 mg/dL (ref 0.44–1.00)
Chloride: 111 mmol/L (ref 101–111)
Glucose, Bld: 92 mg/dL (ref 65–99)
Potassium: 4 mmol/L (ref 3.5–5.1)
Sodium: 136 mmol/L (ref 135–145)
Total Bilirubin: 0.6 mg/dL (ref 0.3–1.2)
Total Protein: 5.2 g/dL — ABNORMAL LOW (ref 6.5–8.1)

## 2016-03-19 LAB — RETICULOCYTES
RBC.: 3.22 MIL/uL — AB (ref 3.87–5.11)
RETIC CT PCT: 1.9 % (ref 0.4–3.1)
Retic Count, Absolute: 61.2 10*3/uL (ref 19.0–186.0)

## 2016-03-19 LAB — CBC
HCT: 30.1 % — ABNORMAL LOW (ref 36.0–46.0)
HEMOGLOBIN: 9.4 g/dL — AB (ref 12.0–15.0)
MCH: 29.2 pg (ref 26.0–34.0)
MCHC: 31.2 g/dL (ref 30.0–36.0)
MCV: 93.5 fL (ref 78.0–100.0)
Platelets: 551 10*3/uL — ABNORMAL HIGH (ref 150–400)
RBC: 3.22 MIL/uL — AB (ref 3.87–5.11)
RDW: 21 % — ABNORMAL HIGH (ref 11.5–15.5)
WBC: 6.4 10*3/uL (ref 4.0–10.5)

## 2016-03-19 LAB — FERRITIN: Ferritin: 130 ng/mL (ref 11–307)

## 2016-03-19 LAB — HIV ANTIBODY (ROUTINE TESTING W REFLEX): HIV SCREEN 4TH GENERATION: NONREACTIVE

## 2016-03-19 LAB — IRON AND TIBC
Iron: 26 ug/dL — ABNORMAL LOW (ref 28–170)
SATURATION RATIOS: 16 % (ref 10.4–31.8)
TIBC: 164 ug/dL — ABNORMAL LOW (ref 250–450)
UIBC: 138 ug/dL

## 2016-03-19 LAB — ACID FAST SMEAR (AFB): ACID FAST SMEAR - AFSCU2: NEGATIVE

## 2016-03-19 LAB — LEGIONELLA PNEUMOPHILA SEROGP 1 UR AG: L. pneumophila Serogp 1 Ur Ag: NEGATIVE

## 2016-03-19 LAB — VITAMIN B12: Vitamin B-12: 1234 pg/mL — ABNORMAL HIGH (ref 180–914)

## 2016-03-19 LAB — ACID FAST SMEAR (AFB, MYCOBACTERIA)

## 2016-03-19 LAB — FOLATE: Folate: 5.4 ng/mL — ABNORMAL LOW (ref >5.9)

## 2016-03-19 MED ORDER — AMOXICILLIN-POT CLAVULANATE 875-125 MG PO TABS
1.0000 | ORAL_TABLET | Freq: Two times a day (BID) | ORAL | Status: DC
  Administered 2016-03-19 – 2016-03-20 (×2): 1 via ORAL
  Filled 2016-03-19 (×2): qty 1

## 2016-03-19 MED ORDER — FOLIC ACID 5 MG/ML IJ SOLN
1.0000 mg | Freq: Every day | INTRAMUSCULAR | Status: DC
  Administered 2016-03-19 – 2016-03-20 (×2): 1 mg via INTRAVENOUS
  Filled 2016-03-19 (×2): qty 0.2

## 2016-03-19 MED ORDER — DOXYCYCLINE HYCLATE 100 MG PO TABS
100.0000 mg | ORAL_TABLET | Freq: Two times a day (BID) | ORAL | Status: DC
  Administered 2016-03-19 – 2016-03-20 (×3): 100 mg via ORAL
  Filled 2016-03-19 (×3): qty 1

## 2016-03-19 NOTE — Progress Notes (Signed)
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 She was brought into the hospital due to three-week history of cough, fever, chills, weakness and fatigue. She was found to have pneumonia in the right lung, with concern for cavitary lesion or abscess. Patient was hospitalized for further management.  Reason for Visit: Pneumonia with possible cavitary lesion  Consultants: Infectious disease. Pulmonology.  Procedures: None So far  Antibiotics: Unasyn and vancomycin  Subjective/Interval History: Patient feels slightly better this morning. Continues to have cough with greenish expectoration. Chest pain has improved. Denies any dizziness today. Shortness of breath has also improved.   ROS: Denies any nausea or vomiting  Objective:  Vital Signs  Vitals:   03/18/16 0900 03/18/16 1725 03/18/16 2042 03/19/16 0619  BP: (!) 88/65 (!) 82/56 (!) 81/55 99/73  Pulse: 68 72 69 74  Resp: 18 18 19 18   Temp: 98.7 F (37.1 C) 98.6 F (37 C) 97.6 F (36.4 C) 97.8 F (36.6 C)  TempSrc: Oral Oral Oral Oral  SpO2: 100% 100% 99% 98%  Weight:   44.6 kg (98 lb 5.2 oz)   Height: 5\' 3"  (1.6 m)       Intake/Output Summary (Last 24 hours) at 03/19/16 0811 Last data filed at 03/19/16 0630  Gross per 24 hour  Intake             4345 ml  Output              850 ml  Net             3495 ml   Filed Weights   03/17/16 2200 03/18/16 2042  Weight: 43.5 kg (95 lb 14.4 oz) 44.6 kg (98 lb 5.2 oz)    General appearance: alert, cooperative, appears stated age and no distress Resp: Diminished air entry at the bases with a few crackles in the right base. No wheezing. No  rhonchi. Cardio: regular rate and rhythm, S1, S2 normal, no murmur, click, rub or gallop GI: soft, non-tender; bowel sounds normal; no masses,  no organomegaly Extremities: extremities normal, atraumatic, no cyanosis or edema Neurologic: Awake and alert. Oriented 3. No focal deficits.  Lab Results:  Data Reviewed: I have personally reviewed following labs and imaging studies  CBC:  Recent Labs Lab 03/17/16 1353 03/18/16 0637 03/19/16 0542  WBC 10.8* 6.9 6.4  NEUTROABS 9.6*  --   --   HGB 11.7* 9.7* 9.4*  HCT 36.6 31.1* 30.1*  MCV 91.5 92.8 93.5  PLT 736* 571* 551*    Basic Metabolic Panel:  Recent Labs Lab 03/17/16 1353 03/18/16 0637 03/19/16 0542  NA 132* 134* 136  K 3.1* 3.8 4.0  CL 98* 106 111  CO2 21* 21* 19*  GLUCOSE 85 110* 92  BUN 9 5* 9  CREATININE 0.92 0.67 0.74  CALCIUM 9.9 8.1* 8.5*    GFR: Estimated Creatinine Clearance: 64.5 mL/min (by C-G formula based on SCr of 0.74 mg/dL).  Liver Function Tests:  Recent Labs Lab 03/17/16 2256 03/18/16 0637 03/19/16 0542  AST 20 17 16   ALT 17 14 13*  ALKPHOS 344* 285* 263*  BILITOT 0.6 0.5 0.6  PROT 6.2* 5.5* 5.2*  ALBUMIN 2.5* 2.1* 2.1*    Coagulation Profile:  Recent Labs Lab  03/17/16 2256  INR 1.00    CBG:  Recent Labs Lab 03/17/16 1351  GLUCAP 93    Thyroid Function Tests:  Recent Labs  03/17/16 2256  TSH 51.049*  FREET4 0.53*    Anemia Panel:  Recent Labs  03/19/16 0542  VITAMINB12 1,234*  FOLATE 5.4*  FERRITIN 130  TIBC 164*  IRON 26*  RETICCTPCT 1.9    Recent Results (from the past 240 hour(s))  Urine culture     Status: Abnormal (Preliminary result)   Collection Time: 03/17/16  5:00 PM  Result Value Ref Range Status   Specimen Description URINE, RANDOM  Final   Special Requests ADDED 2359  Final   Culture >=100,000 COLONIES/mL GRAM NEGATIVE RODS (A)  Final   Report Status PENDING  Incomplete  Culture, expectorated sputum-assessment     Status: None    Collection Time: 03/17/16  8:12 PM  Result Value Ref Range Status   Specimen Description EXPECTORATED SPUTUM  Final   Special Requests NONE  Final   Sputum evaluation   Final    THIS SPECIMEN IS ACCEPTABLE. RESPIRATORY CULTURE REPORT TO FOLLOW.   Report Status 03/18/2016 FINAL  Final  Culture, respiratory (NON-Expectorated)     Status: None (Preliminary result)   Collection Time: 03/17/16  8:12 PM  Result Value Ref Range Status   Specimen Description EXPECTORATED SPUTUM  Final   Special Requests NONE  Final   Gram Stain   Final    ABUNDANT WBC PRESENT, PREDOMINANTLY PMN MODERATE GRAM POSITIVE RODS MODERATE GRAM NEGATIVE RODS MODERATE GRAM POSITIVE COCCI IN PAIRS IN CLUSTERS MODERATE BUDDING YEAST SEEN RARE SQUAMOUS EPITHELIAL CELLS PRESENT    Culture PENDING  Incomplete   Report Status PENDING  Incomplete  Culture, blood (routine x 2) Call MD if unable to obtain prior to antibiotics being given     Status: None (Preliminary result)   Collection Time: 03/17/16 10:41 PM  Result Value Ref Range Status   Specimen Description BLOOD LEFT ANTECUBITAL  Final   Special Requests IN PEDIATRIC BOTTLE 3CC  Final   Culture NO GROWTH < 24 HOURS  Final   Report Status PENDING  Incomplete  Culture, blood (routine x 2) Call MD if unable to obtain prior to antibiotics being given     Status: None (Preliminary result)   Collection Time: 03/17/16 10:56 PM  Result Value Ref Range Status   Specimen Description BLOOD LEFT HAND  Final   Special Requests BOTTLES DRAWN AEROBIC AND ANAEROBIC 5CC EA  Final   Culture NO GROWTH < 24 HOURS  Final   Report Status PENDING  Incomplete  Respiratory Panel by PCR     Status: None   Collection Time: 03/18/16 12:40 AM  Result Value Ref Range Status   Adenovirus NOT DETECTED NOT DETECTED Final   Coronavirus 229E NOT DETECTED NOT DETECTED Final   Coronavirus HKU1 NOT DETECTED NOT DETECTED Final   Coronavirus NL63 NOT DETECTED NOT DETECTED Final   Coronavirus OC43  NOT DETECTED NOT DETECTED Final   Metapneumovirus NOT DETECTED NOT DETECTED Final   Rhinovirus / Enterovirus NOT DETECTED NOT DETECTED Final   Influenza A NOT DETECTED NOT DETECTED Final   Influenza B NOT DETECTED NOT DETECTED Final   Parainfluenza Virus 1 NOT DETECTED NOT DETECTED Final   Parainfluenza Virus 2 NOT DETECTED NOT DETECTED Final   Parainfluenza Virus 3 NOT DETECTED NOT DETECTED Final   Parainfluenza Virus 4 NOT DETECTED NOT DETECTED Final   Respiratory Syncytial Virus NOT DETECTED NOT  DETECTED Final   Bordetella pertussis NOT DETECTED NOT DETECTED Final   Chlamydophila pneumoniae NOT DETECTED NOT DETECTED Final   Mycoplasma pneumoniae NOT DETECTED NOT DETECTED Final      Radiology Studies: Dg Orthopantogram  Result Date: 03/18/2016 CLINICAL DATA:  Poor dentition, several upper front teeth removed, infection. EXAM: ORTHOPANTOGRAM/PANORAMIC COMPARISON:  None. FINDINGS: No periapical lucencies. IMPRESSION: No periapical lucencies to suggest abscess. Electronically Signed   By: Lorin Picket M.D.   On: 03/18/2016 14:14   Dg Chest 2 View  Result Date: 03/17/2016 CLINICAL DATA:  RIGHT side chest pain with cough productive of green material for 3 weeks, history of throat cancer 5 years ago, smoking history EXAM: CHEST  2 VIEW COMPARISON:  09/05/2014 FINDINGS: Tracheostomy tube no longer identified. Normal heart size, mediastinal contours, and pulmonary vascularity. Numerous calcified granulomata throughout both lungs greater on LEFT. Consolidation at lower RIGHT lung, in RIGHT lower lobe and questionably within RIGHT middle lobe as well consistent with pneumonia. Small cavitary focus suspected within the RIGHT lower lobe infiltrate. Remaining lungs clear emphysematous but clear. No pleural effusion or pneumothorax. Severe biconvex thoracic scoliosis. IMPRESSION: Emphysematous and granulomatous disease changes with new infiltrates at the RIGHT lung base consistent with pneumonia.  Suspected cavitation within the RIGHT lower lobe consolidation; CT chest with contrast recommended to exclude lung abscess and necrotic tumor in this patient with a history of throat cancer. Electronically Signed   By: Lavonia Dana M.D.   On: 03/17/2016 15:57   Ct Chest W Contrast  Result Date: 03/17/2016 CLINICAL DATA:  Three-week history of productive cough, fatigue and lack of appetite. Abnormal chest x-ray. EXAM: CT CHEST WITH CONTRAST TECHNIQUE: Multidetector CT imaging of the chest was performed during intravenous contrast administration. CONTRAST:  59mL ISOVUE-300 IOPAMIDOL (ISOVUE-300) INJECTION 61% COMPARISON:  Chest x-ray 03/17/2016 and prior chest CT 08/26/2014 FINDINGS: Chest wall: No breast masses, supraclavicular or axillary lymphadenopathy. Small scattered lymph nodes are noted. The thyroid gland is grossly normal. Cardiovascular: The heart is normal in size given the pectus deformity. No change since prior study. Mild mass effect on the right ventricle. The aorta is normal in caliber. No dissection. The branch vessels are patent. No coronary artery calcifications. Mediastinum/Nodes: No mediastinal or hilar mass or lymphadenopathy. Small scattered lymph nodes are noted. The esophagus is grossly normal. There are calcified mediastinal and hilar lymph nodes along with calcified granulomas in both lungs. Lungs/Pleura: Extensive right lower lobe pneumonia with associated lung abscesses. The left lung is clear. No pleural effusions. No worrisome pulmonary lesions. Upper Abdomen: No significant upper abdominal findings. Musculoskeletal: Significant scoliosis.  No bone lesions. IMPRESSION: 1. Right lower lobe infiltrates and associated abscesses. This is likely an aggressive or atypical pneumonia. 2. Evidence of prior granulomatous disease with numerous calcified mediastinal and hilar lymph nodes and calcified granulomas in both lungs. 3. No mediastinal or hilar mass or lymphadenopathy. 4. No significant  upper abdominal findings. Electronically Signed   By: Marijo Sanes M.D.   On: 03/17/2016 17:56     Medications:  Scheduled: . ampicillin-sulbactam (UNASYN) IV  1.5 g Intravenous Q6H  . feeding supplement (ENSURE ENLIVE)  237 mL Oral BID BM  . guaiFENesin  600 mg Oral BID  . levothyroxine  50 mcg Intravenous Daily  . pantoprazole  40 mg Oral Daily  . potassium chloride SA  10 mEq Oral BID  . vancomycin  1,000 mg Intravenous Q24H   Continuous: . sodium chloride 100 mL/hr at 03/19/16 0221   [id number], ketorolac,  oxyCODONE-acetaminophen, zolpidem  Assessment/Plan:  Principal Problem:   Pulmonary abscess (HCC) Active Problems:   Hypokalemia   History of laryngeal cancer   Hypothyroidism (acquired)   Community acquired pneumonia of right lower lobe of lung (HCC)   Dehydration   Abscess of lower lobe of right lung with pneumonia ([place])   Hypoxemia   Poor dentition   Protein-calorie malnutrition, severe    Atypical pneumonia with cavitary abscesses on CT chest Patient remains stable, although she is noted to have low blood pressures. Lactic acid level is normal. Continue vancomycin and Unasyn as recommended by infectious disease. ID is following. Pulmonology is also following. HIV is Nonreactive. Patient is also being ruled out for tuberculosis, which is thought to be less likely. Follow-up on blood cultures and sputum cultures. Pulmonary toilet.   Hypotension Patient's blood pressure is noted to be low. Heart rate is normal. Blood pressures are slightly improved today. ACTH stimulation test showed inappropriate cortisol response. So she does not appear to have adrenal insufficiency. Low blood pressure could be due to hypothyroidism. Continue to monitor for now. Continue IV fluids. Continue to hold her antihypertensive agents.  Dehydration due to decreased PO intake NS maintenance fluids. Regular diet as tolerated.  Mild Non-anion gap metabolic  acidosis. Continue to monitor closely for now.  Reactive thrombocytosis Ssecondary to acute infection. Improving.  Hypothyroidism TSH is high with low T4. Patient has not been taking her thyroid medicines. Given intravenously for now.  UTI Urine culture pending.   Normocytic anemia-folic acid deficiency Drop in hemoglobin, is dilutional. No evidence for overt bleeding. Anemia panel reviewed. Folic acid level noted to be low. This will be repleted.  DVT Prophylaxis: SCDs    Code Status: Full code  Family Communication: Discussed with the patient  Disposition Plan: Management as outlined above. Await clinical improvement.    LOS: 2 days   [facility] Pager [phone] 03/19/2016, 8:11 AM  If 7PM-7AM, please contact night-coverage at [email or web address], password [facility]

## 2016-03-19 NOTE — Progress Notes (Signed)
[facility] for Infectious Disease   Reason for visit: Follow up on lung abscess  Interval History: had orthopantogram negative for abscess; RVP negative; Did have one AFB sent off; sputum culture sent and pending; + pleuritic pain;  Physical Exam: Constitutional:  Vitals:   03/18/16 2042 03/19/16 0619  BP: (!) 81/55 99/73  Pulse: 69 74  Resp: 19 18  Temp: 97.6 F (36.4 C) 97.8 F (36.6 C)   patient appears in NAD Eyes: anicteric HENT: no thrush Respiratory: Normal respiratory effort; CTA B Cardiovascular: RRR GI: soft, nt, nd  Review of Systems: Constitutional: negative for fevers and sweats Gastrointestinal: negative for diarrhea  Lab Results  Component Value Date   WBC 6.4 03/19/2016   HGB 9.4 (L) 03/19/2016   HCT 30.1 (L) 03/19/2016   MCV 93.5 03/19/2016   PLT 551 (H) 03/19/2016    Lab Results  Component Value Date   CREATININE 0.74 03/19/2016   BUN 9 03/19/2016   NA 136 03/19/2016   K 4.0 03/19/2016   CL 111 03/19/2016   CO2 19 (L) 03/19/2016    Lab Results  Component Value Date   ALT 13 (L) 03/19/2016   AST 16 03/19/2016   ALKPHOS 263 (H) 03/19/2016     Microbiology: Recent Results (from the past 240 hour(s))  Urine culture     Status: Abnormal (Preliminary result)   Collection Time: 03/17/16  5:00 PM  Result Value Ref Range Status   Specimen Description URINE, RANDOM  Final   Special Requests ADDED 2359  Final   Culture >=100,000 COLONIES/mL GRAM NEGATIVE RODS (A)  Final   Report Status PENDING  Incomplete  Culture, expectorated sputum-assessment     Status: None   Collection Time: 03/17/16  8:12 PM  Result Value Ref Range Status   Specimen Description EXPECTORATED SPUTUM  Final   Special Requests NONE  Final   Sputum evaluation   Final    THIS SPECIMEN IS ACCEPTABLE. RESPIRATORY CULTURE REPORT TO FOLLOW.   Report Status 03/18/2016 FINAL  Final  Culture, respiratory (NON-Expectorated)     Status: None (Preliminary result)   Collection Time: 03/17/16  8:12 PM  Result Value Ref Range Status   Specimen Description EXPECTORATED SPUTUM  Final   Special Requests NONE  Final   Gram Stain   Final    ABUNDANT WBC PRESENT, PREDOMINANTLY PMN MODERATE GRAM POSITIVE RODS MODERATE GRAM NEGATIVE RODS MODERATE GRAM POSITIVE COCCI IN PAIRS IN CLUSTERS MODERATE BUDDING YEAST SEEN RARE SQUAMOUS EPITHELIAL CELLS PRESENT    Culture PENDING  Incomplete   Report Status PENDING  Incomplete  Culture, blood (routine x 2) Call MD if unable to obtain prior to antibiotics being given     Status: None (Preliminary result)   Collection Time: 03/17/16 10:41 PM  Result Value Ref Range Status   Specimen Description BLOOD LEFT ANTECUBITAL  Final   Special Requests IN PEDIATRIC BOTTLE 3CC  Final   Culture NO GROWTH < 24 HOURS  Final   Report Status PENDING  Incomplete  Culture, blood (routine x 2) Call MD if unable to obtain prior to antibiotics being given     Status: None (Preliminary result)   Collection Time: 03/17/16 10:56 PM  Result Value Ref Range Status   Specimen Description BLOOD LEFT HAND  Final   Special Requests BOTTLES DRAWN AEROBIC AND ANAEROBIC 5CC EA  Final   Culture NO GROWTH < 24 HOURS  Final   Report Status PENDING  Incomplete  Respiratory Panel  by PCR     Status: None   Collection Time: 03/18/16 12:40 AM  Result Value Ref Range Status   Adenovirus NOT DETECTED NOT DETECTED Final   Coronavirus 229E NOT DETECTED NOT DETECTED Final   Coronavirus HKU1 NOT DETECTED NOT DETECTED Final   Coronavirus NL63 NOT DETECTED NOT DETECTED Final   Coronavirus OC43 NOT DETECTED NOT DETECTED Final   Metapneumovirus NOT DETECTED NOT DETECTED Final   Rhinovirus / Enterovirus NOT DETECTED NOT DETECTED Final   Influenza A NOT DETECTED NOT DETECTED Final   Influenza B NOT DETECTED NOT DETECTED Final   Parainfluenza Virus 1 NOT DETECTED NOT DETECTED Final   Parainfluenza Virus 2 NOT DETECTED NOT DETECTED Final   Parainfluenza Virus 3  NOT DETECTED NOT DETECTED Final   Parainfluenza Virus 4 NOT DETECTED NOT DETECTED Final   Respiratory Syncytial Virus NOT DETECTED NOT DETECTED Final   Bordetella pertussis NOT DETECTED NOT DETECTED Final   Chlamydophila pneumoniae NOT DETECTED NOT DETECTED Final   Mycoplasma pneumoniae NOT DETECTED NOT DETECTED Final    Impression/Plan:  1. Lung abscess - aspiration vs MRSA. She will need long term antibitics for 1-2 months until resolution on CXR.  I will change IV antibiotics to doxycycline and augmentin.  Also history of rll pneumonia in 2015.   2. Granulomatous disease - chronic findings and noted on previous CT in 2015.   3. 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  No history of Tb, does have a history of aspiration pneumonia treated with antibiotics and no risk factors for Tb.  I will d/c isolation.

## 2016-03-19 NOTE — Progress Notes (Signed)
Name: Crystal Spence MRN: [id number] DOB: 13-Feb-1976    ADMISSION DATE:  03/17/2016 CONSULTATION DATE:  03/17/2016  REFERRING MD :  Dr. Eulas Post [facility]  CHIEF COMPLAINT:  Cough  BRIEF PATIENT DESCRIPTION: 40 year old female with history of laryngeal cancer s/p chemo and radiation 2012. Presented 11/5 with CC cough and dizziness, found to have cavitary PNA vs lung abscess. Admitted to hospitalist with PCCM consult.  SIGNIFICANT EVENTS  11/5 admit  STUDIES:  CT chest 11/5 >Right lower lobe infiltrates and associated abscesses. This is likely an aggressive or atypical pneumonia. Evidence of prior granulomatous disease with numerous calcified mediastinal and hilar lymph nodes and calcified granulomas in both lungs.   SUBJECTIVE:  Feels a little better  VITAL SIGNS: Temp:  [97.6 F (36.4 C)-98.9 F (37.2 C)] 98.9 F (37.2 C) (11/07 0900) Pulse Rate:  [69-80] 80 (11/07 0900) Resp:  [18-19] 18 (11/07 0900) BP: (81-99)/(55-73) 97/70 (11/07 0900) SpO2:  [98 %-100 %] 100 % (11/07 0900) Weight:  [44.6 kg (98 lb 5.2 oz)] 44.6 kg (98 lb 5.2 oz) (11/06 2042)  PHYSICAL EXAMINATION: General:  Frail cachectic middle aged female who appears weak and older than stated age, no distress Neuro:  Alert, oriented, non-focal HEENT:  [place]/AT, small tracheostomy fistula remains. PERRL, no JVD Cardiovascular:  RRR, no MRG Lungs:  Bibasilar crackles. No accessory use  Abdomen:  Soft, non-tender, non-distended Musculoskeletal: no acute deformity or ROM limitation Skin:  Grossly intact, appears jaundiced.   Recent Labs Lab 03/17/16 1353 03/18/16 0637 03/19/16 0542  NA 132* 134* 136  K 3.1* 3.8 4.0  CL 98* 106 111  CO2 21* 21* 19*  BUN 9 5* 9  CREATININE 0.92 0.67 0.74  GLUCOSE 85 110* 92    Recent Labs Lab 03/17/16 1353 03/18/16 0637 03/19/16 0542  HGB 11.7* 9.7* 9.4*  HCT 36.6 31.1* 30.1*  WBC 10.8* 6.9 6.4  PLT 736* 571* 551*   Dg Orthopantogram  Result Date: 03/18/2016 CLINICAL  DATA:  Poor dentition, several upper front teeth removed, infection. EXAM: ORTHOPANTOGRAM/PANORAMIC COMPARISON:  None. FINDINGS: No periapical lucencies. IMPRESSION: No periapical lucencies to suggest abscess. Electronically Signed   By: Lorin Picket M.D.   On: 03/18/2016 14:14   Ct Chest W Contrast  Result Date: 03/17/2016 CLINICAL DATA:  Three-week history of productive cough, fatigue and lack of appetite. Abnormal chest x-ray. EXAM: CT CHEST WITH CONTRAST TECHNIQUE: Multidetector CT imaging of the chest was performed during intravenous contrast administration. CONTRAST:  10mL ISOVUE-300 IOPAMIDOL (ISOVUE-300) INJECTION 61% COMPARISON:  Chest x-ray 03/17/2016 and prior chest CT 08/26/2014 FINDINGS: Chest wall: No breast masses, supraclavicular or axillary lymphadenopathy. Small scattered lymph nodes are noted. The thyroid gland is grossly normal. Cardiovascular: The heart is normal in size given the pectus deformity. No change since prior study. Mild mass effect on the right ventricle. The aorta is normal in caliber. No dissection. The branch vessels are patent. No coronary artery calcifications. Mediastinum/Nodes: No mediastinal or hilar mass or lymphadenopathy. Small scattered lymph nodes are noted. The esophagus is grossly normal. There are calcified mediastinal and hilar lymph nodes along with calcified granulomas in both lungs. Lungs/Pleura: Extensive right lower lobe pneumonia with associated lung abscesses. The left lung is clear. No pleural effusions. No worrisome pulmonary lesions. Upper Abdomen: No significant upper abdominal findings. Musculoskeletal: Significant scoliosis.  No bone lesions. IMPRESSION: 1. Right lower lobe infiltrates and associated abscesses. This is likely an aggressive or atypical pneumonia. 2. Evidence of prior granulomatous disease with numerous  calcified mediastinal and hilar lymph nodes and calcified granulomas in both lungs. 3. No mediastinal or hilar mass or  lymphadenopathy. 4. No significant upper abdominal findings. Electronically Signed   By: Marijo Sanes M.D.   On: 03/17/2016 17:56    ASSESSMENT / PLAN:  RLL cavitary PNA/Pulmonary abscesses  Likely chronic aspiration but also consider staph PNA.  Severe periodontal disease -->suspect that she is chronically aspirating translocated bacteria from her mouth. Suspect she will require more dental extractions.   40 year old female with very poor dentition presenting to PCCM with two distinct pulmonary abscesses.  On exam, dentition is very poor and malodorous breath with bibasilar crackles.  I reviewed CT myself, one formed and one forming abscesses in the RLL noted indicating aspiration is more likely.  Discussed with PCCM-NP.  Pulmonary abscess:  - Continue unasyn and vanc.  - HIV negative  - F/u on cultures all negative to date  - No surgical interventions needed at this time.  - Will need abx until closure of abscesses are noted radiographically.  Poor dentition:  - Panoramic dental x-ray negative for abscess.  - Will need multiple extractions once out of the hospital  Hypoxemia:  - Titrate O2 for sat of 92-95%.  - Doubt will need home O2.  Aspiration Pneumonia:  - F/U on cultures.  - Continue augmentin and doxy per ID  Respiratory isolation  - RVP negative, d/c isolation.  PCCM will sign off, please call back if needed.  Rush Farmer, M.D. [facility] Pulmonary/Critical Care Medicine. Pager: [phone]. After hours pager: [phone].

## 2016-03-20 DIAGNOSIS — D649 Anemia, unspecified: Secondary | ICD-10-CM

## 2016-03-20 DIAGNOSIS — E43 Unspecified severe protein-calorie malnutrition: Secondary | ICD-10-CM

## 2016-03-20 DIAGNOSIS — E876 Hypokalemia: Secondary | ICD-10-CM

## 2016-03-20 DIAGNOSIS — D473 Essential (hemorrhagic) thrombocythemia: Secondary | ICD-10-CM

## 2016-03-20 DIAGNOSIS — E039 Hypothyroidism, unspecified: Secondary | ICD-10-CM

## 2016-03-20 DIAGNOSIS — N390 Urinary tract infection, site not specified: Secondary | ICD-10-CM

## 2016-03-20 LAB — CBC
HEMATOCRIT: 29 % — AB (ref 36.0–46.0)
Hemoglobin: 8.8 g/dL — ABNORMAL LOW (ref 12.0–15.0)
MCH: 28.6 pg (ref 26.0–34.0)
MCHC: 30.3 g/dL (ref 30.0–36.0)
MCV: 94.2 fL (ref 78.0–100.0)
PLATELETS: 457 10*3/uL — AB (ref 150–400)
RBC: 3.08 MIL/uL — AB (ref 3.87–5.11)
RDW: 21.2 % — ABNORMAL HIGH (ref 11.5–15.5)
WBC: 5.2 10*3/uL (ref 4.0–10.5)

## 2016-03-20 LAB — BASIC METABOLIC PANEL
Anion gap: 4 — ABNORMAL LOW (ref 5–15)
BUN: 6 mg/dL (ref 6–20)
CHLORIDE: 113 mmol/L — AB (ref 101–111)
CO2: 18 mmol/L — AB (ref 22–32)
Calcium: 8.1 mg/dL — ABNORMAL LOW (ref 8.9–10.3)
Creatinine, Ser: 0.69 mg/dL (ref 0.44–1.00)
GFR calc Af Amer: >60 mL/min (ref >60)
GFR calc non Af Amer: >60 mL/min (ref >60)
GLUCOSE: 85 mg/dL (ref 65–99)
POTASSIUM: 3.7 mmol/L (ref 3.5–5.1)
Sodium: 135 mmol/L (ref 135–145)

## 2016-03-20 LAB — URINE CULTURE

## 2016-03-20 MED ORDER — DOXYCYCLINE HYCLATE 100 MG PO TABS: 100.0000 mg | ORAL_TABLET | Freq: Two times a day (BID) | ORAL | 0 refills | Status: DC

## 2016-03-20 MED ORDER — ENSURE ENLIVE PO LIQD: 237.0000 mL | Freq: Three times a day (TID) | ORAL | Status: DC

## 2016-03-20 MED ORDER — LEVOTHYROXINE SODIUM 75 MCG PO TABS: 75.0000 ug | ORAL_TABLET | Freq: Every day | ORAL | 2 refills | Status: DC

## 2016-03-20 MED ORDER — SACCHAROMYCES BOULARDII 250 MG PO CAPS: 250.0000 mg | ORAL_CAPSULE | Freq: Two times a day (BID) | ORAL | 0 refills | Status: DC

## 2016-03-20 MED ORDER — AMOXICILLIN-POT CLAVULANATE 875-125 MG PO TABS: 1.0000 | ORAL_TABLET | Freq: Two times a day (BID) | ORAL | 0 refills | Status: DC

## 2016-03-20 NOTE — Evaluation (Signed)
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 She was brought into the hospital due to three-week history of cough, fever, chills, weakness and fatigue. She was found to have pneumonia in the right lung, with concern for cavitary lesion or abscess. Patient was hospitalized for further management.  Clinical Impression  Patient seen for mobility assessment. Ambulated increased distance and performed all aspects of mobility without concern. Educated patient on energy conservation as well as splinting for cough. No further acute needs, will sign off.    Follow Up Recommendations No PT follow up    Equipment Recommendations  None recommended by PT    Recommendations for Other Services       Precautions / Restrictions        Mobility  Bed Mobility Overal bed mobility: Independent                Transfers Overall transfer level: Independent                  Ambulation/Gait Ambulation/Gait assistance: Modified independent (Device/Increase time) Ambulation Distance (Feet): 210 Feet Assistive device: None Gait Pattern/deviations: WFL(Within Functional Limits) Gait velocity: decreased   General Gait Details: modest dizziness reported with mobility, no impeding at this time  Stairs            Wheelchair Mobility    Modified Rankin (Stroke Patients Only)       Balance Overall balance assessment: Independent                                           Pertinent Vitals/Pain Pain Assessment: Faces Faces Pain Scale: Hurts a little bit Pain Location: low back Pain Descriptors / Indicators: Aching Pain Intervention(s):  Monitored during session    Home Living Family/patient expects to be discharged to:: Private residence Living Arrangements: Parent;Alone;Children Available Help at Discharge: Family Type of Home: House Home Access: Stairs to enter   Technical brewer of Steps: 1 Home Layout: One level Home Equipment: Shower seat      Prior Function Level of Independence: Independent   Gait / Transfers Assistance Needed: no AD  ADL's / Homemaking Assistance Needed: independent with home mgt, does not drive        Hand Dominance   Dominant Hand: Right    Extremity/Trunk Assessment   Upper Extremity Assessment: Overall WFL for tasks assessed           Lower Extremity Assessment: Overall WFL for tasks assessed         Communication   Communication: No difficulties  Cognition Arousal/Alertness: Awake/alert Behavior During Therapy: WFL for tasks assessed/performed Overall Cognitive Status: Within Functional Limits for tasks assessed                      General Comments      Exercises     Assessment/Plan    PT Assessment Patent does not need any further PT services  PT Problem List            PT Treatment Interventions      PT Goals (Current goals can be found in the Care Plan  section)  Acute Rehab PT Goals PT Goal Formulation: All assessment and education complete, DC therapy    Frequency     Barriers to discharge        Co-evaluation               End of Session Equipment Utilized During Treatment: Gait belt Activity Tolerance: Patient tolerated treatment well Patient left: in chair;with call bell/phone within reach Nurse Communication: Mobility status         Time: [id number] PT Time Calculation (min) (ACUTE ONLY): 17 min   Charges:   PT Evaluation $PT Eval Low Complexity: 1 Procedure     PT G CodesDuncan Dull 04-02-2016, 11:21 AM Alben Deeds, PT DPT  [phone]

## 2016-03-20 NOTE — Progress Notes (Signed)
Crystal Spence to be D/C'd Home per MD order.  Discussed prescriptions and follow up appointments with the patient. Prescriptions given to patient, medication list explained in detail. Pt verbalized understanding.    Medication List    STOP taking these medications   ZITHROMAX Z-PAK 250 MG tablet Generic drug:  azithromycin     TAKE these medications   acetaminophen 500 MG tablet Commonly known as:  TYLENOL Take 1 tablet (500 mg total) by mouth every 4 (four) hours as needed for pain.   amoxicillin-clavulanate 875-125 MG tablet Commonly known as:  AUGMENTIN Take 1 tablet by mouth every 12 (twelve) hours.   butalbital-acetaminophen-caffeine 50-325-40 MG tablet Commonly known as:  FIORICET, ESGIC Take 1 tablet by mouth 3 (three) times daily as needed for headache or migraine.   CARTIA XT 240 MG 24 hr capsule Generic drug:  diltiazem Take 240 mg by mouth daily.   doxycycline 100 MG tablet Commonly known as:  VIBRA-TABS Take 1 tablet (100 mg total) by mouth every 12 (twelve) hours.   etodolac 500 MG tablet Commonly known as:  LODINE Take 500 mg by mouth daily.   ferrous sulfate 325 (65 FE) MG tablet Take 325 mg by mouth daily with breakfast.   levothyroxine 75 MCG tablet Commonly known as:  SYNTHROID Take 1 tablet (75 mcg total) by mouth daily before breakfast. What changed:  medication strength  how much to take   losartan 25 MG tablet Commonly known as:  COZAAR Take 25 mg by mouth daily.   montelukast 10 MG tablet Commonly known as:  SINGULAIR TK 1 T PO QD IN THE EVE   omeprazole 20 MG capsule Commonly known as:  PRILOSEC Take 20 mg by mouth daily.   polyethylene glycol powder powder Commonly known as:  GLYCOLAX/MIRALAX Take 17 g by mouth daily as needed (For constipation.).   potassium chloride SA 20 MEQ tablet Commonly known as:  K-DUR,KLOR-CON TAKE 1 TABLET(20 MEQ) BY MOUTH TWICE DAILY   saccharomyces boulardii 250 MG capsule Commonly known as:   FLORASTOR Take 1 capsule (250 mg total) by mouth 2 (two) times daily.   topiramate 100 MG tablet Commonly known as:  TOPAMAX TK 1 T PO  BID   traZODone 100 MG tablet Commonly known as:  DESYREL Take 100 mg by mouth at bedtime.       Vitals:   03/20/16 0510 03/20/16 1015  BP: 110/80 111/74  Pulse: 73 80  Resp: 18 18  Temp: 97.7 F (36.5 C) 98.3 F (36.8 C)    Skin clean, dry and intact without evidence of skin break down, no evidence of skin tears noted. IV catheter discontinued intact. Site without signs and symptoms of complications. Dressing and pressure applied. Pt denies pain at this time. No complaints noted.  An After Visit Summary was printed and given to the patient. Patient escorted via [place], and D/C home via private auto.  Haywood Lasso BSN, RN [facility] Phone [phone]

## 2016-03-20 NOTE — Progress Notes (Signed)
[facility]   Reason for visit: Follow up on lung abscess  Interval History: no new issues  Physical Exam: Constitutional:  Vitals:   03/20/16 0510 03/20/16 1015  BP: 110/80 111/74  Pulse: 73 80  Resp: 18 18  Temp: 97.7 F (36.5 C) 98.3 F (36.8 C)   Crystal Spence appears in NAD   Review of Systems: Constitutional: negative for fevers   Lab Results  Component Value Date   WBC 5.2 03/20/2016   HGB 8.8 (L) 03/20/2016   HCT 29.0 (L) 03/20/2016   MCV 94.2 03/20/2016   PLT 457 (H) 03/20/2016    Lab Results  Component Value Date   CREATININE 0.69 03/20/2016   BUN 6 03/20/2016   NA 135 03/20/2016   K 3.7 03/20/2016   CL 113 (H) 03/20/2016   CO2 18 (L) 03/20/2016    Lab Results  Component Value Date   ALT 13 (L) 03/19/2016   AST 16 03/19/2016   ALKPHOS 263 (H) 03/19/2016     Microbiology: Recent Results (from the past 240 hour(s))  Urine culture     Status: Abnormal   Collection Time: 03/17/16  5:00 PM  Result Value Ref Range Status   Specimen Description URINE, RANDOM  Final   Special Requests ADDED 2359  Final   Culture >=100,000 COLONIES/mL ESCHERICHIA COLI (A)  Final   Report Status 03/20/2016 FINAL  Final   Organism ID, Bacteria ESCHERICHIA COLI (A)  Final      Susceptibility   Escherichia coli - MIC*    AMPICILLIN <=2 SENSITIVE Sensitive     CEFAZOLIN <=4 SENSITIVE Sensitive     CEFTRIAXONE <=1 SENSITIVE Sensitive     CIPROFLOXACIN <=0.25 SENSITIVE Sensitive     GENTAMICIN <=1 SENSITIVE Sensitive     IMIPENEM <=0.25 SENSITIVE Sensitive     NITROFURANTOIN <=16 SENSITIVE Sensitive     TRIMETH/SULFA <=20 SENSITIVE Sensitive     AMPICILLIN/SULBACTAM <=2 SENSITIVE Sensitive     PIP/TAZO <=4 SENSITIVE Sensitive     Extended ESBL NEGATIVE Sensitive     * >=100,000 COLONIES/mL ESCHERICHIA COLI  Acid Fast Smear (AFB)     Status: None   Collection Time: 03/17/16  8:11 PM  Result Value Ref Range Status   AFB Specimen Processing  Concentration  Final   Acid Fast Smear Negative  Final    Comment: (NOTE) Performed At: [facility] [address] [place], [place] [id number] Lindon Romp MD [id number]    Source (AFB) EXPECTORATED SPUTUM  Final  Culture, expectorated sputum-assessment     Status: None   Collection Time: 03/17/16  8:12 PM  Result Value Ref Range Status   Specimen Description EXPECTORATED SPUTUM  Final   Special Requests NONE  Final   Sputum evaluation   Final    THIS SPECIMEN IS ACCEPTABLE. RESPIRATORY CULTURE REPORT TO FOLLOW.   Report Status 03/18/2016 FINAL  Final  Culture, respiratory (NON-Expectorated)     Status: None (Preliminary result)   Collection Time: 03/17/16  8:12 PM  Result Value Ref Range Status   Specimen Description EXPECTORATED SPUTUM  Final   Special Requests NONE  Final   Gram Stain   Final    ABUNDANT WBC PRESENT, PREDOMINANTLY PMN MODERATE GRAM POSITIVE RODS MODERATE GRAM NEGATIVE RODS MODERATE GRAM POSITIVE COCCI IN PAIRS IN CLUSTERS MODERATE BUDDING YEAST SEEN RARE SQUAMOUS EPITHELIAL CELLS PRESENT    Culture CULTURE REINCUBATED FOR BETTER GROWTH  Final   Report Status PENDING  Incomplete  Culture, blood (routine x 2) Call MD if unable to obtain prior to antibiotics being given     Status: None (Preliminary result)   Collection Time: 03/17/16 10:41 PM  Result Value Ref Range Status   Specimen Description BLOOD LEFT ANTECUBITAL  Final   Special Requests IN PEDIATRIC BOTTLE 3CC  Final   Culture NO GROWTH 2 DAYS  Final   Report Status PENDING  Incomplete  Culture, blood (routine x 2) Call MD if unable to obtain prior to antibiotics being given     Status: None (Preliminary result)   Collection Time: 03/17/16 10:56 PM  Result Value Ref Range Status   Specimen Description BLOOD LEFT HAND  Final   Special Requests BOTTLES DRAWN AEROBIC AND ANAEROBIC 5CC EA  Final   Culture NO GROWTH 2 DAYS  Final   Report Status PENDING  Incomplete  Respiratory Panel by  PCR     Status: None   Collection Time: 03/18/16 12:40 AM  Result Value Ref Range Status   Adenovirus NOT DETECTED NOT DETECTED Final   Coronavirus 229E NOT DETECTED NOT DETECTED Final   Coronavirus HKU1 NOT DETECTED NOT DETECTED Final   Coronavirus NL63 NOT DETECTED NOT DETECTED Final   Coronavirus OC43 NOT DETECTED NOT DETECTED Final   Metapneumovirus NOT DETECTED NOT DETECTED Final   Rhinovirus / Enterovirus NOT DETECTED NOT DETECTED Final   Influenza A NOT DETECTED NOT DETECTED Final   Influenza B NOT DETECTED NOT DETECTED Final   Parainfluenza Virus 1 NOT DETECTED NOT DETECTED Final   Parainfluenza Virus 2 NOT DETECTED NOT DETECTED Final   Parainfluenza Virus 3 NOT DETECTED NOT DETECTED Final   Parainfluenza Virus 4 NOT DETECTED NOT DETECTED Final   Respiratory Syncytial Virus NOT DETECTED NOT DETECTED Final   Bordetella pertussis NOT DETECTED NOT DETECTED Final   Chlamydophila pneumoniae NOT DETECTED NOT DETECTED Final   Mycoplasma pneumoniae NOT DETECTED NOT DETECTED Final    Impression/Plan:  1. Lung abscess - aspiration most likely vs MRSA. She will need long term antibitics for 1-2 months until resolution on CXR.  On doxycycline and augmentin and continue that for at least 1 month with 1 refill.  Also history of rll pneumonia in 2015.   2. Granulomatous disease - chronic findings and noted on previous CT in 2015.   We will arrange follow up in about 1 month  I will sign off, thanks

## 2016-03-20 NOTE — Evaluation (Signed)
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 She was brought into the hospital due to three-week history of cough, fever, chills, weakness and fatigue. She was found to have pneumonia in the right lung, with concern for cavitary lesion or abscess. Patient was hospitalized for further management.   Clinical Impression   Pt is at independent baseline level of function. No further acute OT is indicated at this time    Follow Up Recommendations  No OT follow up    Equipment Recommendations  None recommended by OT    Recommendations for Other Services       Precautions / Restrictions Restrictions Weight Bearing Restrictions: No      Mobility Bed Mobility Overal bed mobility: Independent                Transfers Overall transfer level: Independent                    Balance Overall balance assessment: Independent;No apparent balance deficits (not formally assessed)                                          ADL Overall ADL's : Independent;At baseline                                             Vision Vision Assessment?: No apparent visual deficits              Pertinent Vitals/Pain Pain Assessment: Faces Pain Score: 2  Faces Pain Scale: Hurts a little bit Pain Location: low back Pain Descriptors / Indicators: Aching Pain Intervention(s): Monitored during session     Hand Dominance Right   Extremity/Trunk Assessment Upper Extremity Assessment Upper Extremity Assessment: Overall WFL for tasks assessed   Lower Extremity Assessment Lower Extremity Assessment: Defer to PT evaluation    Cervical / Trunk Assessment Cervical / Trunk Assessment: Normal   Communication Communication Communication: No difficulties   Cognition Arousal/Alertness: Awake/alert Behavior During Therapy: WFL for tasks assessed/performed Overall Cognitive Status: Within Functional Limits for tasks assessed                     General Comments   pt pleasant and cooperative                 Home Living Family/patient expects to be discharged to:: Private residence Living Arrangements: Parent;Alone;Children Available Help at Discharge: Family Type of Home: House Home Access: Stairs to enter Technical brewer of Steps: 1   Home Layout: One level     Bathroom Shower/Tub: Tub/shower unit Shower/tub characteristics: Architectural technologist: Standard     Home Equipment: Civil engineer, contracting          Prior Functioning/Environment Level of Independence: Animal nutritionist / Transfers Assistance Needed: no AD ADL's / Homemaking Assistance Needed: independent with home mgt, does not drive            OT Problem List: Pain   OT Treatment/Interventions:      OT  Goals(Current goals can be found in the care plan section) Acute Rehab OT Goals Patient Stated Goal: go home OT Goal Formulation: With patient  OT Frequency:     Barriers to D/C:    no barriers                     End of Session Equipment Utilized During Treatment: Gait belt  Activity Tolerance: Patient tolerated treatment well Patient left: in chair;with call bell/phone within reach   Time: 1042-1056 OT Time Calculation (min): 14 min Charges:  OT General Charges $OT Visit: 1 Procedure OT Evaluation $OT Eval Moderate Complexity: 1 Procedure G-Codes:    Britt Bottom 03/20/2016, 12:58 PM

## 2016-03-20 NOTE — Discharge Summary (Signed)
Physician Discharge Summary  Crystal Spence [id number] DOB: 1975-09-20 DOA: 03/17/2016  PCP: Benito Mccreedy, MD  Admit date: 03/17/2016 Discharge date: 03/20/2016  Admitted From: Home Disposition: Home  Recommendations for Outpatient Follow-up:  1. Follow up with PCP in 1-2 weeks, F/U with [facility] Dr. Linus Salmons in 1 month 2. Please obtain BMP/CBC in one week, Check TSH in 4 weeks.  Home Health: NA Equipment/Devices:NA  Discharge Condition: Stable CODE STATUS: Full Code Diet recommendation: Diet regular Room service appropriate? 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 She was brought into the hospital due to three-week history of cough, fever, chills, weakness and fatigue. She was found to have pneumonia in the right lung, with concern for cavitary lesion or abscess. Patient was hospitalized for further management.  Discharge Diagnoses:  Principal Problem:   Pulmonary abscess ([place]) Active Problems:   Hypokalemia   History of laryngeal cancer   Hypothyroidism (acquired)   Community acquired pneumonia of right lower lobe of lung (HCC)   Dehydration   Abscess of lower lobe of right lung with pneumonia (HCC)   Hypoxemia   Poor dentition   Protein-calorie malnutrition, severe   Acute UTI   Chronic anemia   Thrombocytosis (HCC)   Right lower lobe pneumonia with cavitary abscesses -Presented with SOB, night fever and CXR findings of pneumonia with 2 abscesses. -Pulmonary and ID consulted, recommended prolonged antibiotics.  -Has history of laryngeal cancer, status post surgery and radiation, history of tracheostomy. -Has history of right lower lobe pneumonia, cannot rule out aspiration. -Aspiration pneumonia versus less  likely necrotizing MRSA pneumonia per ID. -Recommended Augmentin and doxycycline for at least one month, follow with ID as outpatient.  Hypotension -This is likely secondary to the pneumonia, this is resolved. -Restarted back on her antihypertensive medications.  Dehydration due to decreased PO intake -NS maintenance fluids. Regular diet as tolerated.  Mild Non-anion gap metabolic acidosis. -With bicarbonate of 18 the day of discharge,  Reactive thrombocytosis -From site was likely reactive to concurrent acute infection. Check CBC in one month. 457 on discharge.  Hypothyroidism -TSH is elevated at 51.0149, Patient has not been taking her thyroid medicines. Given intravenously while inpatient. -She was on 50 g at home, increase her dose to 75 g. -Please check TSH in 4-6 weeks  Escherichia coli UTI -Urinalysis was strongly positive for UTI, culture grew Escherichia coli which is susceptible to Augmentin.  Normocytic anemia-folic acid deficiency Drop in hemoglobin, is dilutional. No evidence for overt bleeding. Anemia panel reviewed. Folic acid level noted to be low. This will be repleted.  Discharge Instructions  Discharge Instructions    Diet - low sodium heart healthy    Complete by:  As directed    Increase activity slowly    Complete by:  As directed        Medication List    STOP taking these medications   ZITHROMAX Z-PAK 250 MG tablet Generic drug:  azithromycin     TAKE these medications   acetaminophen 500 MG tablet Commonly known as:  TYLENOL Take 1 tablet (500 mg total) by mouth every 4 (four) hours as needed for pain.   amoxicillin-clavulanate 875-125 MG tablet Commonly known as:  AUGMENTIN Take 1 tablet by mouth every 12 (twelve) hours.   butalbital-acetaminophen-caffeine 50-325-40 MG tablet Commonly known  as:  FIORICET, ESGIC Take 1 tablet by mouth 3 (three) times daily as needed for headache or migraine.   CARTIA XT 240 MG 24 hr  capsule Generic drug:  diltiazem Take 240 mg by mouth daily.   doxycycline 100 MG tablet Commonly known as:  VIBRA-TABS Take 1 tablet (100 mg total) by mouth every 12 (twelve) hours.   etodolac 500 MG tablet Commonly known as:  LODINE Take 500 mg by mouth daily.   ferrous sulfate 325 (65 FE) MG tablet Take 325 mg by mouth daily with breakfast.   levothyroxine 75 MCG tablet Commonly known as:  SYNTHROID Take 1 tablet (75 mcg total) by mouth daily before breakfast. What changed:  medication strength  how much to take   losartan 25 MG tablet Commonly known as:  COZAAR Take 25 mg by mouth daily.   montelukast 10 MG tablet Commonly known as:  SINGULAIR TK 1 T PO QD IN THE EVE   omeprazole 20 MG capsule Commonly known as:  PRILOSEC Take 20 mg by mouth daily.   polyethylene glycol powder powder Commonly known as:  GLYCOLAX/MIRALAX Take 17 g by mouth daily as needed (For constipation.).   potassium chloride SA 20 MEQ tablet Commonly known as:  K-DUR,KLOR-CON TAKE 1 TABLET(20 MEQ) BY MOUTH TWICE DAILY   saccharomyces boulardii 250 MG capsule Commonly known as:  FLORASTOR Take 1 capsule (250 mg total) by mouth 2 (two) times daily.   topiramate 100 MG tablet Commonly known as:  TOPAMAX TK 1 T PO  BID   traZODone 100 MG tablet Commonly known as:  DESYREL Take 100 mg by mouth at bedtime.       No Known Allergies  Consultations:  ID.  PCCM.   Procedures (Echo, Carotid, EGD, Colonoscopy, ERCP)   Radiological studies: Dg Orthopantogram  Result Date: 03/18/2016 CLINICAL DATA:  Poor dentition, several upper front teeth removed, infection. EXAM: ORTHOPANTOGRAM/PANORAMIC COMPARISON:  None. FINDINGS: No periapical lucencies. IMPRESSION: No periapical lucencies to suggest abscess. Electronically Signed   By: Lorin Picket M.D.   On: 03/18/2016 14:14   Dg Chest 2 View  Result Date: 03/17/2016 CLINICAL DATA:  RIGHT side chest pain with cough productive of green  material for 3 weeks, history of throat cancer 5 years ago, smoking history EXAM: CHEST  2 VIEW COMPARISON:  09/05/2014 FINDINGS: Tracheostomy tube no longer identified. Normal heart size, mediastinal contours, and pulmonary vascularity. Numerous calcified granulomata throughout both lungs greater on LEFT. Consolidation at lower RIGHT lung, in RIGHT lower lobe and questionably within RIGHT middle lobe as well consistent with pneumonia. Small cavitary focus suspected within the RIGHT lower lobe infiltrate. Remaining lungs clear emphysematous but clear. No pleural effusion or pneumothorax. Severe biconvex thoracic scoliosis. IMPRESSION: Emphysematous and granulomatous disease changes with new infiltrates at the RIGHT lung base consistent with pneumonia. Suspected cavitation within the RIGHT lower lobe consolidation; CT chest with contrast recommended to exclude lung abscess and necrotic tumor in this patient with a history of throat cancer. Electronically Signed   By: Lavonia Dana M.D.   On: 03/17/2016 15:57   Ct Chest W Contrast  Result Date: 03/17/2016 CLINICAL DATA:  Three-week history of productive cough, fatigue and lack of appetite. Abnormal chest x-ray. EXAM: CT CHEST WITH CONTRAST TECHNIQUE: Multidetector CT imaging of the chest was performed during intravenous contrast administration. CONTRAST:  64mL ISOVUE-300 IOPAMIDOL (ISOVUE-300) INJECTION 61% COMPARISON:  Chest x-ray 03/17/2016 and prior chest CT 08/26/2014 FINDINGS: Chest wall: No breast masses, supraclavicular or axillary lymphadenopathy.  Small scattered lymph nodes are noted. The thyroid gland is grossly normal. Cardiovascular: The heart is normal in size given the pectus deformity. No change since prior study. Mild mass effect on the right ventricle. The aorta is normal in caliber. No dissection. The branch vessels are patent. No coronary artery calcifications. Mediastinum/Nodes: No mediastinal or hilar mass or lymphadenopathy. Small scattered  lymph nodes are noted. The esophagus is grossly normal. There are calcified mediastinal and hilar lymph nodes along with calcified granulomas in both lungs. Lungs/Pleura: Extensive right lower lobe pneumonia with associated lung abscesses. The left lung is clear. No pleural effusions. No worrisome pulmonary lesions. Upper Abdomen: No significant upper abdominal findings. Musculoskeletal: Significant scoliosis.  No bone lesions. IMPRESSION: 1. Right lower lobe infiltrates and associated abscesses. This is likely an aggressive or atypical pneumonia. 2. Evidence of prior granulomatous disease with numerous calcified mediastinal and hilar lymph nodes and calcified granulomas in both lungs. 3. No mediastinal or hilar mass or lymphadenopathy. 4. No significant upper abdominal findings. Electronically Signed   By: Marijo Sanes M.D.   On: 03/17/2016 17:56    Subjective:  Discharge Exam: Vitals:   03/19/16 1900 03/19/16 2158 03/20/16 0510 03/20/16 1015  BP: 95/70 119/82 110/80 111/74  Pulse: 75 71 73 80  Resp: 18 17 18 18   Temp: 98 F (36.7 C) 97.4 F (36.3 C) 97.7 F (36.5 C) 98.3 F (36.8 C)  TempSrc: Oral Oral Oral Oral  SpO2: 100% 98% 98% 98%  Weight:  48.4 kg (106 lb 12.8 oz)    Height:  5\' 1"  (1.549 m)     General: Pt is alert, awake, not in acute distress Cardiovascular: RRR, S1/S2 +, no rubs, no gallops Respiratory: CTA bilaterally, no wheezing, no rhonchi Abdominal: Soft, NT, ND, bowel sounds + Extremities: no edema, no cyanosis   The results of significant diagnostics from this hospitalization (including imaging, microbiology, ancillary and laboratory) are listed below for reference.    Microbiology: Recent Results (from the past 240 hour(s))  Urine culture     Status: Abnormal   Collection Time: 03/17/16  5:00 PM  Result Value Ref Range Status   Specimen Description URINE, RANDOM  Final   Special Requests ADDED 2359  Final   Culture >=100,000 COLONIES/mL ESCHERICHIA COLI (A)   Final   Report Status 03/20/2016 FINAL  Final   Organism ID, Bacteria ESCHERICHIA COLI (A)  Final      Susceptibility   Escherichia coli - MIC*    AMPICILLIN <=2 SENSITIVE Sensitive     CEFAZOLIN <=4 SENSITIVE Sensitive     CEFTRIAXONE <=1 SENSITIVE Sensitive     CIPROFLOXACIN <=0.25 SENSITIVE Sensitive     GENTAMICIN <=1 SENSITIVE Sensitive     IMIPENEM <=0.25 SENSITIVE Sensitive     NITROFURANTOIN <=16 SENSITIVE Sensitive     TRIMETH/SULFA <=20 SENSITIVE Sensitive     AMPICILLIN/SULBACTAM <=2 SENSITIVE Sensitive     PIP/TAZO <=4 SENSITIVE Sensitive     Extended ESBL NEGATIVE Sensitive     * >=100,000 COLONIES/mL ESCHERICHIA COLI  Acid Fast Smear (AFB)     Status: None   Collection Time: 03/17/16  8:11 PM  Result Value Ref Range Status   AFB Specimen Processing Concentration  Final   Acid Fast Smear Negative  Final    Comment: (NOTE) Performed At: [facility] [address] [place], [place] [id number] Lindon Romp MD [id number]    Source (AFB) EXPECTORATED SPUTUM  Final  Culture, expectorated sputum-assessment     Status:  None   Collection Time: 03/17/16  8:12 PM  Result Value Ref Range Status   Specimen Description EXPECTORATED SPUTUM  Final   Special Requests NONE  Final   Sputum evaluation   Final    THIS SPECIMEN IS ACCEPTABLE. RESPIRATORY CULTURE REPORT TO FOLLOW.   Report Status 03/18/2016 FINAL  Final  Culture, respiratory (NON-Expectorated)     Status: None (Preliminary result)   Collection Time: 03/17/16  8:12 PM  Result Value Ref Range Status   Specimen Description EXPECTORATED SPUTUM  Final   Special Requests NONE  Final   Gram Stain   Final    ABUNDANT WBC PRESENT, PREDOMINANTLY PMN MODERATE GRAM POSITIVE RODS MODERATE GRAM NEGATIVE RODS MODERATE GRAM POSITIVE COCCI IN PAIRS IN CLUSTERS MODERATE BUDDING YEAST SEEN RARE SQUAMOUS EPITHELIAL CELLS PRESENT    Culture CULTURE REINCUBATED FOR BETTER GROWTH  Final   Report Status PENDING   Incomplete  Culture, blood (routine x 2) Call MD if unable to obtain prior to antibiotics being given     Status: None (Preliminary result)   Collection Time: 03/17/16 10:41 PM  Result Value Ref Range Status   Specimen Description BLOOD LEFT ANTECUBITAL  Final   Special Requests IN PEDIATRIC BOTTLE 3CC  Final   Culture NO GROWTH 3 DAYS  Final   Report Status PENDING  Incomplete  Culture, blood (routine x 2) Call MD if unable to obtain prior to antibiotics being given     Status: None (Preliminary result)   Collection Time: 03/17/16 10:56 PM  Result Value Ref Range Status   Specimen Description BLOOD LEFT HAND  Final   Special Requests BOTTLES DRAWN AEROBIC AND ANAEROBIC 5CC EA  Final   Culture NO GROWTH 3 DAYS  Final   Report Status PENDING  Incomplete  Respiratory Panel by PCR     Status: None   Collection Time: 03/18/16 12:40 AM  Result Value Ref Range Status   Adenovirus NOT DETECTED NOT DETECTED Final   Coronavirus 229E NOT DETECTED NOT DETECTED Final   Coronavirus HKU1 NOT DETECTED NOT DETECTED Final   Coronavirus NL63 NOT DETECTED NOT DETECTED Final   Coronavirus OC43 NOT DETECTED NOT DETECTED Final   Metapneumovirus NOT DETECTED NOT DETECTED Final   Rhinovirus / Enterovirus NOT DETECTED NOT DETECTED Final   Influenza A NOT DETECTED NOT DETECTED Final   Influenza B NOT DETECTED NOT DETECTED Final   Parainfluenza Virus 1 NOT DETECTED NOT DETECTED Final   Parainfluenza Virus 2 NOT DETECTED NOT DETECTED Final   Parainfluenza Virus 3 NOT DETECTED NOT DETECTED Final   Parainfluenza Virus 4 NOT DETECTED NOT DETECTED Final   Respiratory Syncytial Virus NOT DETECTED NOT DETECTED Final   Bordetella pertussis NOT DETECTED NOT DETECTED Final   Chlamydophila pneumoniae NOT DETECTED NOT DETECTED Final   Mycoplasma pneumoniae NOT DETECTED NOT DETECTED Final     Labs: BNP (last 3 results) No results for input(s): BNP in the last 8760 hours. Basic Metabolic Panel:  Recent Labs Lab  03/17/16 1353 03/18/16 0637 03/19/16 0542 03/20/16 0549  NA 132* 134* 136 135  K 3.1* 3.8 4.0 3.7  CL 98* 106 111 113*  CO2 21* 21* 19* 18*  GLUCOSE 85 110* 92 85  BUN 9 5* 9 6  CREATININE 0.92 0.67 0.74 0.69  CALCIUM 9.9 8.1* 8.5* 8.1*   Liver Function Tests:  Recent Labs Lab 03/17/16 2256 03/18/16 0637 03/19/16 0542  AST 20 17 16   ALT 17 14 13*  ALKPHOS 344* 285* 263*  BILITOT 0.6 0.5 0.6  PROT 6.2* 5.5* 5.2*  ALBUMIN 2.5* 2.1* 2.1*   No results for input(s): LIPASE, AMYLASE in the last 168 hours. No results for input(s): AMMONIA in the last 168 hours. CBC:  Recent Labs Lab 03/17/16 1353 03/18/16 0637 03/19/16 0542 03/20/16 0549  WBC 10.8* 6.9 6.4 5.2  NEUTROABS 9.6*  --   --   --   HGB 11.7* 9.7* 9.4* 8.8*  HCT 36.6 31.1* 30.1* 29.0*  MCV 91.5 92.8 93.5 94.2  PLT 736* 571* 551* 457*   Cardiac Enzymes: No results for input(s): CKTOTAL, CKMB, CKMBINDEX, TROPONINI in the last 168 hours. BNP: Invalid input(s): POCBNP CBG:  Recent Labs Lab 03/17/16 1351  GLUCAP 93   D-Dimer No results for input(s): DDIMER in the last 72 hours. Hgb A1c No results for input(s): HGBA1C in the last 72 hours. Lipid Profile No results for input(s): CHOL, HDL, LDLCALC, TRIG, CHOLHDL, LDLDIRECT in the last 72 hours. Thyroid function studies  Recent Labs  03/17/16 2256  TSH 51.049*   Anemia work up  Recent Labs  03/19/16 0542  VITAMINB12 1,234*  FOLATE 5.4*  FERRITIN 130  TIBC 164*  IRON 26*  RETICCTPCT 1.9   Urinalysis    Component Value Date/Time   COLORURINE AMBER (A) 03/17/2016 1700   APPEARANCEUR TURBID (A) 03/17/2016 1700   LABSPEC 1.020 03/17/2016 1700   PHURINE 6.0 03/17/2016 1700   GLUCOSEU NEGATIVE 03/17/2016 1700   HGBUR SMALL (A) 03/17/2016 1700   BILIRUBINUR SMALL (A) 03/17/2016 1700   KETONESUR 15 (A) 03/17/2016 1700   PROTEINUR 30 (A) 03/17/2016 1700   UROBILINOGEN 0.2 12/06/2013 1718   NITRITE POSITIVE (A) 03/17/2016 1700    LEUKOCYTESUR LARGE (A) 03/17/2016 1700   Sepsis Labs Invalid input(s): PROCALCITONIN,  WBC,  LACTICIDVEN Microbiology Recent Results (from the past 240 hour(s))  Urine culture     Status: Abnormal   Collection Time: 03/17/16  5:00 PM  Result Value Ref Range Status   Specimen Description URINE, RANDOM  Final   Special Requests ADDED 2359  Final   Culture >=100,000 COLONIES/mL ESCHERICHIA COLI (A)  Final   Report Status 03/20/2016 FINAL  Final   Organism ID, Bacteria ESCHERICHIA COLI (A)  Final      Susceptibility   Escherichia coli - MIC*    AMPICILLIN <=2 SENSITIVE Sensitive     CEFAZOLIN <=4 SENSITIVE Sensitive     CEFTRIAXONE <=1 SENSITIVE Sensitive     CIPROFLOXACIN <=0.25 SENSITIVE Sensitive     GENTAMICIN <=1 SENSITIVE Sensitive     IMIPENEM <=0.25 SENSITIVE Sensitive     NITROFURANTOIN <=16 SENSITIVE Sensitive     TRIMETH/SULFA <=20 SENSITIVE Sensitive     AMPICILLIN/SULBACTAM <=2 SENSITIVE Sensitive     PIP/TAZO <=4 SENSITIVE Sensitive     Extended ESBL NEGATIVE Sensitive     * >=100,000 COLONIES/mL ESCHERICHIA COLI  Acid Fast Smear (AFB)     Status: None   Collection Time: 03/17/16  8:11 PM  Result Value Ref Range Status   AFB Specimen Processing Concentration  Final   Acid Fast Smear Negative  Final    Comment: (NOTE) Performed At: [facility] [address] [place], [place] [id number] Lindon Romp MD [id number]    Source (AFB) EXPECTORATED SPUTUM  Final  Culture, expectorated sputum-assessment     Status: None   Collection Time: 03/17/16  8:12 PM  Result Value Ref Range Status   Specimen Description EXPECTORATED SPUTUM  Final   Special Requests NONE  Final  Sputum evaluation   Final    THIS SPECIMEN IS ACCEPTABLE. RESPIRATORY CULTURE REPORT TO FOLLOW.   Report Status 03/18/2016 FINAL  Final  Culture, respiratory (NON-Expectorated)     Status: None (Preliminary result)   Collection Time: 03/17/16  8:12 PM  Result Value Ref Range Status    Specimen Description EXPECTORATED SPUTUM  Final   Special Requests NONE  Final   Gram Stain   Final    ABUNDANT WBC PRESENT, PREDOMINANTLY PMN MODERATE GRAM POSITIVE RODS MODERATE GRAM NEGATIVE RODS MODERATE GRAM POSITIVE COCCI IN PAIRS IN CLUSTERS MODERATE BUDDING YEAST SEEN RARE SQUAMOUS EPITHELIAL CELLS PRESENT    Culture CULTURE REINCUBATED FOR BETTER GROWTH  Final   Report Status PENDING  Incomplete  Culture, blood (routine x 2) Call MD if unable to obtain prior to antibiotics being given     Status: None (Preliminary result)   Collection Time: 03/17/16 10:41 PM  Result Value Ref Range Status   Specimen Description BLOOD LEFT ANTECUBITAL  Final   Special Requests IN PEDIATRIC BOTTLE 3CC  Final   Culture NO GROWTH 3 DAYS  Final   Report Status PENDING  Incomplete  Culture, blood (routine x 2) Call MD if unable to obtain prior to antibiotics being given     Status: None (Preliminary result)   Collection Time: 03/17/16 10:56 PM  Result Value Ref Range Status   Specimen Description BLOOD LEFT HAND  Final   Special Requests BOTTLES DRAWN AEROBIC AND ANAEROBIC 5CC EA  Final   Culture NO GROWTH 3 DAYS  Final   Report Status PENDING  Incomplete  Respiratory Panel by PCR     Status: None   Collection Time: 03/18/16 12:40 AM  Result Value Ref Range Status   Adenovirus NOT DETECTED NOT DETECTED Final   Coronavirus 229E NOT DETECTED NOT DETECTED Final   Coronavirus HKU1 NOT DETECTED NOT DETECTED Final   Coronavirus NL63 NOT DETECTED NOT DETECTED Final   Coronavirus OC43 NOT DETECTED NOT DETECTED Final   Metapneumovirus NOT DETECTED NOT DETECTED Final   Rhinovirus / Enterovirus NOT DETECTED NOT DETECTED Final   Influenza A NOT DETECTED NOT DETECTED Final   Influenza B NOT DETECTED NOT DETECTED Final   Parainfluenza Virus 1 NOT DETECTED NOT DETECTED Final   Parainfluenza Virus 2 NOT DETECTED NOT DETECTED Final   Parainfluenza Virus 3 NOT DETECTED NOT DETECTED Final   Parainfluenza  Virus 4 NOT DETECTED NOT DETECTED Final   Respiratory Syncytial Virus NOT DETECTED NOT DETECTED Final   Bordetella pertussis NOT DETECTED NOT DETECTED Final   Chlamydophila pneumoniae NOT DETECTED NOT DETECTED Final   Mycoplasma pneumoniae NOT DETECTED NOT DETECTED Final     Time coordinating discharge: Over 30 minutes  SIGNED:   Birdie Hopes, MD  [facility] 03/20/2016, 11:12 AM Pager   If 7PM-7AM, please contact night-coverage [email or web address] Password [email or web address]

## 2016-03-20 NOTE — Progress Notes (Signed)
Nutrition Follow-up   DOCUMENTATION CODES:   Severe malnutrition in context of acute illness/injury, Underweight  INTERVENTION:  Provide Ensure Enlive po TID, each supplement provides 350 kcal and 20 grams of protein.  Encourage adequate PO intake.   NUTRITION DIAGNOSIS:   Malnutrition related to acute illness as evidenced by energy intake < or equal to 50% for > or equal to 5 days, severe depletion of body fat, severe depletion of muscle mass; ongoing  GOAL:   Patient will meet greater than or equal to 90% of their needs; not met  MONITOR:   PO intake, Supplement acceptance, Labs, Weight trends, Skin, I & O's  REASON FOR ASSESSMENT:   Malnutrition Screening Tool    ASSESSMENT:   40 y.o. 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 Pt with severe periodontal disease. Per MD note, suspect that she is chronically aspirating translocated bacteria from her mouth. Suspect she will require more dental extractions  Meal completion has been 15-75%. Pt reports appetite is just "ok". Pt reports food at meals have been good. Pt denies any trouble with chewing or swallowing. Pt currently has Ensure ordered and has been consuming them. RD to increase Ensure to TID to aid in caloric and protein needs.   Labs and medications reviewed. Potassium WNL.   Diet Order:  Diet regular Room service appropriate? Yes; Fluid consistency: Thin  Skin:  Wound (see comment) (wound on throat)  Last BM:  11/7  Height:   Ht Readings from Last 1 Encounters:  03/19/16 _0  (1.549 m)    Weight:   Wt Readings from Last 1 Encounters:  03/19/16 106 lb 12.8 oz (48.4 kg)    Ideal Body Weight:  52.27 kg  BMI:  Body mass index is 20.18  kg/m.  Estimated Nutritional Needs:   Kcal:  1550-1800  Protein:  65-80 grams  Fluid:  >/= 1.5 L/day  EDUCATION NEEDS:   No education needs identified at this time  Corrin Parker, MS, RD, LDN Pager # [phone] After hours/ weekend pager # [phone]

## 2016-03-22 LAB — CULTURE, RESPIRATORY W GRAM STAIN: Culture: NORMAL

## 2016-03-22 LAB — CULTURE, BLOOD (ROUTINE X 2)
CULTURE: NO GROWTH
CULTURE: NO GROWTH

## 2016-04-29 ENCOUNTER — Ambulatory Visit
Admission: RE | Admit: 2016-04-29 | Discharge: 2016-04-29 | Disposition: A | Discharge status: Home / Self Care | Payer: Medicaid Other | Source: Ambulatory Visit | Attending: Internal Medicine | Admitting: Internal Medicine

## 2016-04-29 ENCOUNTER — Telehealth: Payer: Self-pay | Admitting: *Deleted

## 2016-04-29 ENCOUNTER — Ambulatory Visit (INDEPENDENT_AMBULATORY_CARE_PROVIDER_SITE_OTHER): Payer: Medicaid Other | Admitting: Internal Medicine

## 2016-04-29 ENCOUNTER — Encounter: Payer: Self-pay | Admitting: Internal Medicine

## 2016-04-29 VITALS — BP 157/109 | HR 104 | Temp 98.0°F | Ht 61.0 in | Wt 84.0 lb

## 2016-04-29 DIAGNOSIS — K089 Disorder of teeth and supporting structures, unspecified: Secondary | ICD-10-CM | POA: Diagnosis not present

## 2016-04-29 DIAGNOSIS — Z5181 Encounter for therapeutic drug level monitoring: Secondary | ICD-10-CM | POA: Insufficient documentation

## 2016-04-29 DIAGNOSIS — J851 Abscess of lung with pneumonia: Secondary | ICD-10-CM

## 2016-04-29 MED ORDER — AMOXICILLIN-POT CLAVULANATE 875-125 MG PO TABS: 1.0000 | ORAL_TABLET | Freq: Two times a day (BID) | ORAL | 0 refills | Status: AC

## 2016-04-29 MED ORDER — DOXYCYCLINE HYCLATE 100 MG PO TABS: 100.0000 mg | ORAL_TABLET | Freq: Two times a day (BID) | ORAL | 0 refills | Status: AC

## 2016-04-29 NOTE — Assessment & Plan Note (Signed)
I suspect abscess is related to this and needs good dental care.

## 2016-04-29 NOTE — Progress Notes (Signed)
   Subjective:    Patient ID: Crystal Spence, female    DOB: 1975-08-03, 40 y.o.   MRN: [id number]  HPI Here for hsfu.  She was seen about 1 month ago with 3 weeks of productive cough, fever, chills and CT chest noted a lung abscess.  Cultures did not grow anything and she was sent out on Augmentin and doxycycline.  She continues to be fatigued, no fever, no chills.  Still with productive cough.  Also notes some leg numbness.  No chest pain, no SOB, no DOE.    Review of Systems  Constitutional: Positive for fatigue.  Skin: Negative for rash.  Neurological: Negative for headaches.       Objective:   Physical Exam  Constitutional:  Chronically ill appearing  Eyes: No scleral icterus.  Cardiovascular: Normal rate, regular rhythm and normal heart sounds.   Pulmonary/Chest: Effort normal and breath sounds normal. No respiratory distress.  Skin: No rash noted.   SHx: former smoker       Assessment & Plan:

## 2016-04-29 NOTE — Assessment & Plan Note (Signed)
No GI upset or dysphagia with antibiotics.

## 2016-04-29 NOTE — Telephone Encounter (Signed)
Called patient and relayed Dr. Henreitta Leber message of improved chest xray and the need to have it repeated in one month. Scheduled a follow up appt with Dr. Linus Salmons for 05/27/16. Myrtis Hopping CMA

## 2016-04-29 NOTE — Assessment & Plan Note (Signed)
CXR today much improved.  With some minimal remaining areas I will extend antibiotics 2 more weeks and repeat CXR in 1 month.

## 2016-04-30 LAB — ACID FAST CULTURE WITH REFLEXED SENSITIVITIES: ACID FAST CULTURE - AFSCU3: NEGATIVE

## 2016-05-24 ENCOUNTER — Ambulatory Visit
Admission: RE | Admit: 2016-05-24 | Discharge: 2016-05-24 | Disposition: A | Discharge status: Home / Self Care | Payer: Medicaid Other | Source: Ambulatory Visit | Attending: Internal Medicine | Admitting: Internal Medicine

## 2016-05-24 DIAGNOSIS — J851 Abscess of lung with pneumonia: Secondary | ICD-10-CM

## 2016-05-27 ENCOUNTER — Encounter: Payer: Self-pay | Admitting: Internal Medicine

## 2016-05-27 ENCOUNTER — Ambulatory Visit (INDEPENDENT_AMBULATORY_CARE_PROVIDER_SITE_OTHER): Payer: Medicaid Other | Admitting: Internal Medicine

## 2016-05-27 DIAGNOSIS — Z9189 Other specified personal risk factors, not elsewhere classified: Secondary | ICD-10-CM

## 2016-05-27 DIAGNOSIS — J851 Abscess of lung with pneumonia: Secondary | ICD-10-CM

## 2016-05-27 NOTE — Assessment & Plan Note (Signed)
Now resolved.  No further antibiotics indicated.   CXR personally reviewed.

## 2016-05-27 NOTE — Assessment & Plan Note (Signed)
Reminded her to get good oral care.

## 2016-05-27 NOTE — Progress Notes (Signed)
   Subjective:    Patient ID: Crystal Spence, female    DOB: 30-Mar-1976, 41 y.o.   MRN: [id number]  HPI Here for hsfu.  She was seen initially in the hospital with 3 weeks of productive cough, fever, chills and CT chest noted a lung abscess.  Cultures did not grow anything and she was sent out on Augmentin and doxycycline.  She continued to be fatigued, no fever, no chills.  Follow up CXR in December did note considerable improvement but some residual findings so I had her continue with her antibiotic for 2 more weeks. Now comes in for follow up 2 weeks off of antibiotics and her repeat CXR shows resolution with just some scaring.  No chest pain, no SOB, no DOE.    Review of Systems  Constitutional: Positive for fatigue.  Skin: Negative for rash.  Neurological: Negative for headaches.       Objective:   Physical Exam  Constitutional:  Chronically ill appearing  Eyes: No scleral icterus.  Cardiovascular: Normal rate, regular rhythm and normal heart sounds.   Pulmonary/Chest: Effort normal and breath sounds normal. No respiratory distress.  Skin: No rash noted.   SHx: former smoker       Assessment & Plan:

## 2016-07-24 ENCOUNTER — Ambulatory Visit: Payer: Self-pay | Admitting: Otolaryngology

## 2016-07-24 NOTE — H&P (Signed)
ELLSIE Spence is an 41 y.o. female.   Chief Complaint: tracheocutaneous fistula HPI: History of head and neck cancer, had tracheostomy, had extensive radiation therapy. Tracheostomy has been removed but the fistula has failed to heal up completely.  Past Medical History:  Diagnosis Date  . Anemia, unspecified 06/17/2013  . History of laryngeal cancer 02/03/2013  . Hypertension   . Hypothyroidism (acquired) 06/17/2013  . Multiple lung nodules 06/28/2014  . Pneumonia 01/14/2014  . Poor oral hygiene 06/17/2013  . Rib pain 01/14/2014  . Rib pain on left side 01/05/2014  . Throat cancer [facility])     Past Surgical History:  Procedure Laterality Date  . ABDOMINAL SURGERY    . COLONOSCOPY WITH PROPOFOL N/A 10/12/2015   Procedure: COLONOSCOPY WITH PROPOFOL;  Surgeon: Wonda Horner, MD;  Location: [facility] ENDOSCOPY;  Service: Endoscopy;  Laterality: N/A;  . ESOPHAGOGASTRODUODENOSCOPY N/A 02/03/2013   Procedure: ESOPHAGOGASTRODUODENOSCOPY (EGD);  Surgeon: Juanita Craver, MD;  Location: [facility] ENDOSCOPY;  Service: Endoscopy;  Laterality: N/A;  . ESOPHAGOGASTRODUODENOSCOPY N/A 10/12/2015   Procedure: ESOPHAGOGASTRODUODENOSCOPY (EGD);  Surgeon: Wonda Horner, MD;  Location: [facility];  Service: Endoscopy;  Laterality: N/A;  . TRACHEOSTOMY     for throat inflammation from radiation  . TUBAL LIGATION      Family History  Problem Relation Age of Onset  . Cancer Maternal Grandmother     skin cancer   Social History:  reports that she quit smoking about 5 years ago. She has a 15.00 pack-year smoking history. She has never used smokeless tobacco. She reports that she uses drugs, including Marijuana. She reports that she does not drink alcohol.  Allergies: No Known Allergies   (Not in a hospital admission)  No results found for this or any previous visit (from the past 48 hour(s)). No results found.  ROS: otherwise negative  Last menstrual period 10/20/2012.  PHYSICAL EXAM: Overall appearance:  Thin lady, in  no distress Head:  Normocephalic, atraumatic. Ears: External ears look normal. Nose: External nose is healthy in appearance. Internal nasal exam free of any lesions or obstruction. Oral Cavity/pharynx:  There are no mucosal lesions or masses identified. Hypopharynx/Larynx: no signs of any mucosal lesions or masses identified. Vocal cords move normally. Neuro:  No identifiable neurologic deficits. Neck: Small tracheocutaneous fistula present. No palpable neck masses.  Studies Reviewed: none    Assessment/Plan Proceed with closure of tracheocutaneous fistula.  Tylin Force 07/24/2016, 12:08 PM

## 2016-07-29 NOTE — Pre-Procedure Instructions (Addendum)
Crystal Spence  07/29/2016      [facility] - [address], [address] - [address] AT [address] [place] [address] Phone: [phone] Fax: [phone]    Your procedure is scheduled on  Wed. March 21  Report to [facility] Admitting at  9:30 A.M.  Call this number if you have problems the morning of surgery:  [phone]   Remember:  Do not eat food or drink liquids after midnight.   Take these medicines the morning of surgery with A SIP OF WATER : tylenol if needed, fioricet if needed, cartia XT, clonazepam (klonopin) if needed,levothyroxine (synthroid), omeprazole (prilosec), topamax              stop: advil, motritn, ibuprofen, aleve, BC Powders, Goody's, vitamins/herbal medicines.   Do not wear jewelry, make-up or nail polish.  Do not wear lotions, powders, or perfumes, or deoderant.  Do not shave 48 hours prior to surgery.  Men may shave face and neck.  Do not bring valuables to the hospital.  [facility] is not responsible for any belongings or valuables.  Contacts, dentures or bridgework may not be worn into surgery.  Leave your suitcase in the car.  After surgery it may be brought to your room.  For patients admitted to the hospital, discharge time will be determined by your treatment team.  Patients discharged the day of surgery will not be allowed to drive home.   Special instructions:  [facility]- Preparing For Surgery  Before surgery, you can play an important role. Because skin is not sterile, your skin needs to be as free of germs as possible. You can reduce the number of germs on your skin by washing with CHG (chlorahexidine gluconate) Soap before surgery.  CHG is an antiseptic cleaner which kills germs and bonds with the skin to continue killing germs even after washing.  Please do not use if you have an allergy to CHG or antibacterial soaps. If your skin becomes reddened/irritated  stop using the CHG.  Do not shave (including legs and underarms) for at least 48 hours prior to first CHG shower. It is OK to shave your face.  Please follow these instructions carefully.   1. Shower the NIGHT BEFORE SURGERY and the MORNING OF SURGERY with CHG.   2. If you chose to wash your hair, wash your hair first as usual with your normal shampoo.  3. After you shampoo, rinse your hair and body thoroughly to remove the shampoo.  4. Use CHG as you would any other liquid soap. You can apply CHG directly to the skin and wash gently with a scrungie or a clean washcloth.   5. Apply the CHG Soap to your body ONLY FROM THE NECK DOWN.  Do not use on open wounds or open sores. Avoid contact with your eyes, ears, mouth and genitals (private parts). Wash genitals (private parts) with your normal soap.  6. Wash thoroughly, paying special attention to the area where your surgery will be performed.  7. Thoroughly rinse your body with warm water from the neck down.  8. DO NOT shower/wash with your normal soap after using and rinsing off the CHG Soap.  9. Pat yourself dry with a CLEAN TOWEL.   10. Wear CLEAN PAJAMAS   11. Place CLEAN SHEETS on your bed the night of your first shower and DO NOT SLEEP WITH PETS.  Day of Surgery: Do not apply any deodorants/lotions. Please wear clean clothes to the hospital/surgery center.      Please read over the following fact sheets that you were given. Coughing and Deep Breathing

## 2016-07-30 ENCOUNTER — Encounter (HOSPITAL_COMMUNITY)
Admission: RE | Admit: 2016-07-30 | Discharge: 2016-07-30 | Disposition: A | Discharge status: Home / Self Care | Payer: Medicaid Other | Source: Ambulatory Visit | Attending: Otolaryngology | Admitting: Otolaryngology

## 2016-07-30 ENCOUNTER — Encounter (HOSPITAL_COMMUNITY): Payer: Self-pay

## 2016-07-30 DIAGNOSIS — Z01812 Encounter for preprocedural laboratory examination: Secondary | ICD-10-CM | POA: Insufficient documentation

## 2016-07-30 HISTORY — DX: Headache, unspecified: R51.9

## 2016-07-30 HISTORY — DX: Headache: R51

## 2016-07-30 LAB — CBC
HCT: 30.4 % — ABNORMAL LOW (ref 36.0–46.0)
Hemoglobin: 8.8 g/dL — ABNORMAL LOW (ref 12.0–15.0)
MCH: 26.1 pg (ref 26.0–34.0)
MCHC: 28.9 g/dL — AB (ref 30.0–36.0)
MCV: 90.2 fL (ref 78.0–100.0)
PLATELETS: 205 10*3/uL (ref 150–400)
RBC: 3.37 MIL/uL — ABNORMAL LOW (ref 3.87–5.11)
RDW: 19.1 % — AB (ref 11.5–15.5)
WBC: 3.7 10*3/uL — ABNORMAL LOW (ref 4.0–10.5)

## 2016-07-30 LAB — BASIC METABOLIC PANEL
Anion gap: 11 (ref 5–15)
BUN: 7 mg/dL (ref 6–20)
CALCIUM: 9.5 mg/dL (ref 8.9–10.3)
CO2: 20 mmol/L — AB (ref 22–32)
CREATININE: 0.7 mg/dL (ref 0.44–1.00)
Chloride: 106 mmol/L (ref 101–111)
GFR calc Af Amer: >60 mL/min (ref >60)
GFR calc non Af Amer: >60 mL/min (ref >60)
GLUCOSE: 63 mg/dL — AB (ref 65–99)
Potassium: 3.9 mmol/L (ref 3.5–5.1)
Sodium: 137 mmol/L (ref 135–145)

## 2016-07-30 NOTE — Progress Notes (Signed)
Anesthesia Chart Review: Patient is a 41 year old female scheduled for tracheocutaneous fistula repair on 07/31/16 by Dr. Constance Holster.  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  - PCP is Dr. Iona Beard Osei-Bonsu. - ID is Dr. Scharlene Gloss, last visit 05/27/16. He felt her RLL pneumonia had resolved, and no further antibiotics needed.   - HEM-ONC is Dr. Heath Lark.  Meds include Fioricet, Cartia XT, Lodine, clonazepam, levothyroxine, losartan, Singulair, Prilosec, KCl, Topamax, trazodone.  BP 121/83   Pulse 96   Temp 36.6 C   Resp 20   Ht 5\' 1"  (1.549 m)   Wt 97 lb 12.8 oz (44.4 kg)   LMP 10/20/2012   SpO2 100%   BMI 18.48 kg/m   EKG 03/17/16: SR, occasional PVC, borderline T wave abnormalities, lateral leads.    CXR 04/29/16: IMPRESSION: 1. Unchanged residual patchy and streaky opacity in the right lower lobe. Burtis Junes this is postinfectious scarring from the November cavitary pneumonia. If symptoms continue then recommend repeat Chest CT (noncontrast should suffice). 2. No pleural effusion or new cardiopulmonary abnormality.  Chest CT 03/17/16: IMPRESSION: 1. Right lower lobe infiltrates and associated abscesses. This is likely an aggressive or atypical pneumonia. 2. Evidence of prior granulomatous disease with numerous calcified mediastinal and hilar lymph nodes and calcified granulomas in both lungs. 3. No mediastinal or hilar mass or lymphadenopathy. 4. No significant upper abdominal findings.  Preoperative labs noted. Cr 0.70. H/H 8.8/30.4 which is unchanged from 03/20/16. PLT 205K. Glucose 62. PAT RN notified Dr. Janeice Robinson staff of H/H. Since labs show known anemia,  I will defer to surgeon and/or anesthesiologist decision for pre-operative T&S (I did not order one.) LMP listed as 10/20/12 ("postmenopausal"). Per social history, she is not sexually active.   Per PAT RN, patient denied any acute cardiopulmonary issues at PAT. If no acute changes then I anticipate that she can proceed as planned. Have asked staff to confirm OB/GYN status and get pregnancy test if indicated prior to surgery. Anesthesiologist to evaluate on the day of surgery.  George Hugh Zachary - [facility]/Anesthesiology Phone [phone] 07/30/2016 2:26 PM

## 2016-07-30 NOTE — Progress Notes (Addendum)
PCP: Dr. Iona Beard Osei-Bonsu  and Dr. Herbie Baltimore Comer.(lung abscess resolved)  Notified Dr. Janeice Robinson office of abnormal labs.

## 2016-07-31 ENCOUNTER — Ambulatory Visit (HOSPITAL_COMMUNITY): Payer: Medicaid Other | Admitting: Certified Registered"

## 2016-07-31 ENCOUNTER — Encounter (HOSPITAL_COMMUNITY): Payer: Self-pay | Admitting: *Deleted

## 2016-07-31 ENCOUNTER — Ambulatory Visit (HOSPITAL_COMMUNITY): Payer: Medicaid Other | Admitting: Vascular Surgery

## 2016-07-31 ENCOUNTER — Ambulatory Visit (HOSPITAL_COMMUNITY)
Admission: RE | Admit: 2016-07-31 | Discharge: 2016-07-31 | Disposition: A | Discharge status: Home / Self Care | Payer: Medicaid Other | Source: Ambulatory Visit | Attending: Otolaryngology | Admitting: Otolaryngology

## 2016-07-31 ENCOUNTER — Encounter (HOSPITAL_COMMUNITY)
Admission: RE | Disposition: A | Discharge status: Home / Self Care | Payer: Self-pay | Source: Ambulatory Visit | Attending: Otolaryngology

## 2016-07-31 DIAGNOSIS — Z9221 Personal history of antineoplastic chemotherapy: Secondary | ICD-10-CM | POA: Diagnosis not present

## 2016-07-31 DIAGNOSIS — I10 Essential (primary) hypertension: Secondary | ICD-10-CM | POA: Insufficient documentation

## 2016-07-31 DIAGNOSIS — Z79899 Other long term (current) drug therapy: Secondary | ICD-10-CM | POA: Diagnosis not present

## 2016-07-31 DIAGNOSIS — Z87891 Personal history of nicotine dependence: Secondary | ICD-10-CM | POA: Diagnosis not present

## 2016-07-31 DIAGNOSIS — E039 Hypothyroidism, unspecified: Secondary | ICD-10-CM | POA: Diagnosis not present

## 2016-07-31 DIAGNOSIS — J9509 Other tracheostomy complication: Secondary | ICD-10-CM | POA: Diagnosis present

## 2016-07-31 DIAGNOSIS — Z8521 Personal history of malignant neoplasm of larynx: Secondary | ICD-10-CM | POA: Insufficient documentation

## 2016-07-31 HISTORY — PX: TRACHEOESOPHAGEAL FISTULA REPAIR: SHX2557

## 2016-07-31 SURGERY — TRACHEAL ESOPHAGEAL FISTULA REPAIR
Anesthesia: General | Site: Neck

## 2016-07-31 MED ORDER — FENTANYL CITRATE (PF) 100 MCG/2ML IJ SOLN
INTRAMUSCULAR | Status: DC | PRN
  Administered 2016-07-31: 100 ug via INTRAVENOUS

## 2016-07-31 MED ORDER — ONDANSETRON HCL 4 MG/2ML IJ SOLN
INTRAMUSCULAR | Status: DC | PRN
  Administered 2016-07-31: 4 mg via INTRAVENOUS

## 2016-07-31 MED ORDER — FENTANYL CITRATE (PF) 100 MCG/2ML IJ SOLN
INTRAMUSCULAR | Status: AC
  Filled 2016-07-31: qty 2

## 2016-07-31 MED ORDER — PROPOFOL 10 MG/ML IV BOLUS
INTRAVENOUS | Status: AC
  Filled 2016-07-31: qty 20

## 2016-07-31 MED ORDER — SUCCINYLCHOLINE CHLORIDE 200 MG/10ML IV SOSY
PREFILLED_SYRINGE | INTRAVENOUS | Status: DC | PRN
  Administered 2016-07-31: 100 mg via INTRAVENOUS

## 2016-07-31 MED ORDER — MIDAZOLAM HCL 5 MG/5ML IJ SOLN
INTRAMUSCULAR | Status: DC | PRN
  Administered 2016-07-31: 2 mg via INTRAVENOUS

## 2016-07-31 MED ORDER — OXYCODONE HCL 5 MG PO TABS: 5.0000 mg | ORAL_TABLET | Freq: Once | ORAL | Status: DC | PRN

## 2016-07-31 MED ORDER — PROPOFOL 10 MG/ML IV BOLUS
INTRAVENOUS | Status: DC | PRN
  Administered 2016-07-31: 200 mg via INTRAVENOUS

## 2016-07-31 MED ORDER — BACITRACIN ZINC 500 UNIT/GM EX OINT
TOPICAL_OINTMENT | CUTANEOUS | Status: AC
  Filled 2016-07-31: qty 28.35

## 2016-07-31 MED ORDER — ONDANSETRON HCL 4 MG/2ML IJ SOLN
INTRAMUSCULAR | Status: AC
  Filled 2016-07-31: qty 2

## 2016-07-31 MED ORDER — 0.9 % SODIUM CHLORIDE (POUR BTL) OPTIME
TOPICAL | Status: DC | PRN
  Administered 2016-07-31: 1000 mL

## 2016-07-31 MED ORDER — OXYCODONE HCL 5 MG/5ML PO SOLN: 5.0000 mg | Freq: Once | ORAL | Status: DC | PRN

## 2016-07-31 MED ORDER — MEPERIDINE HCL 25 MG/ML IJ SOLN: 6.2500 mg | INTRAMUSCULAR | Status: DC | PRN

## 2016-07-31 MED ORDER — CEFAZOLIN SODIUM-DEXTROSE 2-4 GM/100ML-% IV SOLN
2.0000 g | INTRAVENOUS | Status: AC
  Administered 2016-07-31: 2 g via INTRAVENOUS

## 2016-07-31 MED ORDER — ACETAMINOPHEN 325 MG PO TABS: 325.0000 mg | ORAL_TABLET | ORAL | Status: DC | PRN

## 2016-07-31 MED ORDER — ROCURONIUM BROMIDE 50 MG/5ML IV SOSY
PREFILLED_SYRINGE | INTRAVENOUS | Status: AC
  Filled 2016-07-31: qty 5

## 2016-07-31 MED ORDER — LIDOCAINE 2% (20 MG/ML) 5 ML SYRINGE
INTRAMUSCULAR | Status: DC | PRN
  Administered 2016-07-31: 100 mg via INTRAVENOUS

## 2016-07-31 MED ORDER — FENTANYL CITRATE (PF) 100 MCG/2ML IJ SOLN
25.0000 ug | INTRAMUSCULAR | Status: DC | PRN
  Administered 2016-07-31 (×2): 50 ug via INTRAVENOUS

## 2016-07-31 MED ORDER — LIDOCAINE-EPINEPHRINE 2 %-1:100000 IJ SOLN
INTRAMUSCULAR | Status: AC
  Filled 2016-07-31: qty 1

## 2016-07-31 MED ORDER — CEPHALEXIN 500 MG PO CAPS: 500.0000 mg | ORAL_CAPSULE | Freq: Three times a day (TID) | ORAL | 0 refills | Status: DC

## 2016-07-31 MED ORDER — ONDANSETRON HCL 4 MG/2ML IJ SOLN: 4.0000 mg | Freq: Once | INTRAMUSCULAR | Status: DC | PRN

## 2016-07-31 MED ORDER — FENTANYL CITRATE (PF) 100 MCG/2ML IJ SOLN
INTRAMUSCULAR | Status: AC
  Administered 2016-07-31: 50 ug via INTRAVENOUS
  Filled 2016-07-31: qty 2

## 2016-07-31 MED ORDER — HYDROCODONE-ACETAMINOPHEN 7.5-325 MG PO TABS: 1.0000 | ORAL_TABLET | Freq: Four times a day (QID) | ORAL | 0 refills | Status: DC | PRN

## 2016-07-31 MED ORDER — LIDOCAINE 2% (20 MG/ML) 5 ML SYRINGE
INTRAMUSCULAR | Status: AC
  Filled 2016-07-31: qty 5

## 2016-07-31 MED ORDER — ACETAMINOPHEN 160 MG/5ML PO SOLN: 325.0000 mg | ORAL | Status: DC | PRN

## 2016-07-31 MED ORDER — MIDAZOLAM HCL 2 MG/2ML IJ SOLN
INTRAMUSCULAR | Status: AC
  Filled 2016-07-31: qty 2

## 2016-07-31 MED ORDER — LACTATED RINGERS IV SOLN
INTRAVENOUS | Status: DC
Start: 2016-07-31 — End: 2016-07-31
  Administered 2016-07-31: 10:00:00 via INTRAVENOUS

## 2016-07-31 SURGICAL SUPPLY — 40 items
BLADE CLIPPER SURG (BLADE) IMPLANT
BLADE SURG 15 STRL LF DISP TIS (BLADE) IMPLANT
BLADE SURG 15 STRL SS (BLADE)
CANISTER SUCT 3000ML PPV (MISCELLANEOUS) ×3 IMPLANT
CLEANER TIP ELECTROSURG 2X2 (MISCELLANEOUS) ×3 IMPLANT
COVER SURGICAL LIGHT HANDLE (MISCELLANEOUS) ×3 IMPLANT
DECANTER SPIKE VIAL GLASS SM (MISCELLANEOUS) ×3 IMPLANT
DRAPE PROXIMA HALF (DRAPES) IMPLANT
ELECT COATED BLADE 2.86 ST (ELECTRODE) ×3 IMPLANT
ELECT REM PT RETURN 9FT ADLT (ELECTROSURGICAL) ×3
ELECTRODE REM PT RTRN 9FT ADLT (ELECTROSURGICAL) ×1 IMPLANT
GAUZE SPONGE 2X2 8PLY STRL LF (GAUZE/BANDAGES/DRESSINGS) ×1 IMPLANT
GAUZE SPONGE 4X4 16PLY XRAY LF (GAUZE/BANDAGES/DRESSINGS) ×3 IMPLANT
GLOVE BIOGEL PI IND STRL 6.5 (GLOVE) ×2 IMPLANT
GLOVE BIOGEL PI INDICATOR 6.5 (GLOVE) ×4
GLOVE ECLIPSE 6.5 STRL STRAW (GLOVE) ×6 IMPLANT
GLOVE ECLIPSE 7.5 STRL STRAW (GLOVE) ×3 IMPLANT
GOWN STRL REUS W/ TWL LRG LVL3 (GOWN DISPOSABLE) ×1 IMPLANT
GOWN STRL REUS W/TWL LRG LVL3 (GOWN DISPOSABLE) ×2
KIT BASIN OR (CUSTOM PROCEDURE TRAY) ×3 IMPLANT
KIT ROOM TURNOVER OR (KITS) ×3 IMPLANT
NEEDLE PRECISIONGLIDE 27X1.5 (NEEDLE) IMPLANT
NS IRRIG 1000ML POUR BTL (IV SOLUTION) ×3 IMPLANT
PACK EENT II TURBAN DRAPE (CUSTOM PROCEDURE TRAY) ×3 IMPLANT
PAD ARMBOARD 7.5X6 YLW CONV (MISCELLANEOUS) ×6 IMPLANT
PENCIL FOOT CONTROL (ELECTRODE) ×3 IMPLANT
SPONGE GAUZE 2X2 STER 10/PKG (GAUZE/BANDAGES/DRESSINGS) ×2
SUT CHROMIC 2 0 SH (SUTURE) IMPLANT
SUT CHROMIC 3 0 SH 27 (SUTURE) ×3 IMPLANT
SUT ETHILON 3 0 PS 1 (SUTURE) IMPLANT
SUT SILK 4 0 TIES 17X18 (SUTURE) ×3 IMPLANT
SYR 20CC LL (SYRINGE) ×3 IMPLANT
SYR CONTROL 10ML LL (SYRINGE) IMPLANT
TAPE CLOTH SURG 4X10 WHT LF (GAUZE/BANDAGES/DRESSINGS) ×3 IMPLANT
TAPE SURG TRANSPORE 1 IN (GAUZE/BANDAGES/DRESSINGS) ×1 IMPLANT
TAPE SURGICAL TRANSPORE 1 IN (GAUZE/BANDAGES/DRESSINGS) ×2
TOWEL OR 17X24 6PK STRL BLUE (TOWEL DISPOSABLE) ×3 IMPLANT
TUBE CONNECTING 12'X1/4 (SUCTIONS) ×1
TUBE CONNECTING 12X1/4 (SUCTIONS) ×2 IMPLANT
WATER STERILE IRR 1000ML POUR (IV SOLUTION) ×3 IMPLANT

## 2016-07-31 NOTE — Op Note (Signed)
OPERATIVE REPORT  DATE OF SURGERY: 07/31/2016  PATIENT:  Lorain Childes,  41 y.o. female  PRE-OPERATIVE DIAGNOSIS:  tracheocutaneous fistula  POST-OPERATIVE DIAGNOSIS:  tracheocutaneous fistula  PROCEDURE:  Procedure(s): TRACHEOCUTANEOUS FISTULA REPAIR  SURGEON:  Beckie Salts, MD  ASSISTANTS: None  ANESTHESIA:   General   EBL:  0 ml  DRAINS: None  LOCAL MEDICATIONS USED:  None  SPECIMEN:  none  COUNTS:  Correct  PROCEDURE DETAILS: The patient was taken to the operating room and placed on the operating table in the supine position. Following induction of general endotracheal anesthesia, the neck was prepped and draped in a standard fashion. Electrocautery was used to excise the cervical skin surrounding the fistula site and dissection continued down through the subcutaneous tissue all the way to the trachea. The entire epithelial lined tract was removed. The defect was then closed in 3 layers using interrupted 3-0 chromic sutures on the tracheal layer, the deep soft tissue layer and the skin layer. Bacitracin and a dressing were applied. Patient was awakened extubated and transferred to recovery in stable condition.    PATIENT DISPOSITION:  To PACU, stable

## 2016-07-31 NOTE — Transfer of Care (Signed)
Immediate Anesthesia Transfer of Care Note  Patient: Crystal Spence  Procedure(s) Performed: Procedure(s): TRACHEOCUTANEOUS FISTULA REPAIR (N/A)  Patient Location: PACU  Anesthesia Type:General  Level of Consciousness: awake, oriented and patient cooperative  Airway & Oxygen Therapy: Patient Spontanous Breathing and Patient connected to nasal cannula oxygen  Post-op Assessment: Report given to RN, Post -op Vital signs reviewed and stable and Patient moving all extremities  Post vital signs: Reviewed and stable  Last Vitals:  Vitals:   07/31/16 0908  BP: (!) 131/94  Pulse: 100  Resp: 20  Temp: 36.4 C    Last Pain:  Vitals:   07/31/16 0908  TempSrc: Oral         Complications: No apparent anesthesia complications

## 2016-07-31 NOTE — Anesthesia Preprocedure Evaluation (Signed)
Anesthesia Evaluation  Patient identified by MRN, date of birth, ID band Patient awake    Reviewed: Allergy & Precautions, NPO status , Patient's Chart, lab work & pertinent test results  Airway Mallampati: II  TM Distance: >3 FB Neck ROM: Full    Dental no notable dental hx. (+) Poor Dentition, Missing Very bad dentition:   Pulmonary former smoker,    Pulmonary exam normal breath sounds clear to auscultation       Cardiovascular hypertension, Pt. on medications Normal cardiovascular exam Rhythm:Regular Rate:Normal     Neuro/Psych    GI/Hepatic negative GI ROS, Neg liver ROS,   Endo/Other  Hypothyroidism   Renal/GU negative Renal ROS  negative genitourinary   Musculoskeletal negative musculoskeletal ROS (+)   Abdominal (+) + scaphoid   Peds negative pediatric ROS (+)  Hematology   Anesthesia Other Findings H/o laryngeal cancer s/p chemo and XRT 2012. Previous tracheostomy  Reproductive/Obstetrics negative OB ROS                             Anesthesia Physical  Anesthesia Plan  ASA: II  Anesthesia Plan: General   Post-op Pain Management:    Induction: Intravenous  Airway Management Planned: Oral ETT  Additional Equipment:   Intra-op Plan:   Post-operative Plan: Extubation in OR  Informed Consent: I have reviewed the patients History and Physical, chart, labs and discussed the procedure including the risks, benefits and alternatives for the proposed anesthesia with the patient or authorized representative who has indicated his/her understanding and acceptance.   Dental advisory given  Plan Discussed with: CRNA and Surgeon  Anesthesia Plan Comments:         Anesthesia Quick Evaluation

## 2016-07-31 NOTE — Interval H&P Note (Signed)
History and Physical Interval Note:  07/31/2016 11:24 AM  Crystal Spence  has presented today for surgery, with the diagnosis of tracheocutaneous fistula  The various methods of treatment have been discussed with the patient and family. After consideration of risks, benefits and other options for treatment, the patient has consented to  Procedure(s): TRACHEOCUTANEOUS FISTULA REPAIR (N/A) as a surgical intervention .  The patient's history has been reviewed, patient examined, no change in status, stable for surgery.  I have reviewed the patient's chart and labs.  Questions were answered to the patient's satisfaction.     Jony Ladnier

## 2016-07-31 NOTE — Discharge Instructions (Signed)
Apply antibiotic ointment to the surgical site 3 times daily and keep the dressing on it for 1 week.

## 2016-07-31 NOTE — Anesthesia Procedure Notes (Signed)
Procedure Name: Intubation Date/Time: 07/31/2016 11:42 AM Performed by: Melina Copa, Ether Goebel R Pre-anesthesia Checklist: Patient identified, Emergency Drugs available, Suction available and Patient being monitored Patient Re-evaluated:Patient Re-evaluated prior to inductionOxygen Delivery Method: Circle System Utilized Preoxygenation: Pre-oxygenation with 100% oxygen Intubation Type: IV induction Ventilation: Mask ventilation without difficulty Laryngoscope Size: Mac and 3 Grade View: Grade II Tube type: Oral Tube size: 7.0 mm Number of attempts: 1 Airway Equipment and Method: Stylet Placement Confirmation: ETT inserted through vocal cords under direct vision,  positive ETCO2 and breath sounds checked- equal and bilateral Secured at: 24 cm Tube secured with: Tape Dental Injury: Teeth and Oropharynx as per pre-operative assessment

## 2016-07-31 NOTE — H&P (View-Only) (Signed)
Crystal Spence is an 41 y.o. female.   Chief Complaint: tracheocutaneous fistula HPI: History of head and neck cancer, had tracheostomy, had extensive radiation therapy. Tracheostomy has been removed but the fistula has failed to heal up completely.  Past Medical History:  Diagnosis Date  . Anemia, unspecified 06/17/2013  . History of laryngeal cancer 02/03/2013  . Hypertension   . Hypothyroidism (acquired) 06/17/2013  . Multiple lung nodules 06/28/2014  . Pneumonia 01/14/2014  . Poor oral hygiene 06/17/2013  . Rib pain 01/14/2014  . Rib pain on left side 01/05/2014  . Throat cancer [facility])     Past Surgical History:  Procedure Laterality Date  . ABDOMINAL SURGERY    . COLONOSCOPY WITH PROPOFOL N/A 10/12/2015   Procedure: COLONOSCOPY WITH PROPOFOL;  Surgeon: Wonda Horner, MD;  Location: [facility];  Service: Endoscopy;  Laterality: N/A;  . ESOPHAGOGASTRODUODENOSCOPY N/A 02/03/2013   Procedure: ESOPHAGOGASTRODUODENOSCOPY (EGD);  Surgeon: Juanita Craver, MD;  Location: [facility];  Service: Endoscopy;  Laterality: N/A;  . ESOPHAGOGASTRODUODENOSCOPY N/A 10/12/2015   Procedure: ESOPHAGOGASTRODUODENOSCOPY (EGD);  Surgeon: Wonda Horner, MD;  Location: [facility];  Service: Endoscopy;  Laterality: N/A;  . TRACHEOSTOMY     for throat inflammation from radiation  . TUBAL LIGATION      Family History  Problem Relation Age of Onset  . Cancer Maternal Grandmother     skin cancer   Social History:  reports that she quit smoking about 5 years ago. She has a 15.00 pack-year smoking history. She has never used smokeless tobacco. She reports that she uses drugs, including Marijuana. She reports that she does not drink alcohol.  Allergies: No Known Allergies   (Not in a hospital admission)  No results found for this or any previous visit (from the past 48 hour(s)). No results found.  ROS: otherwise negative  Last menstrual period 10/20/2012.  PHYSICAL EXAM: Overall appearance:  Thin lady, in  no distress Head:  Normocephalic, atraumatic. Ears: External ears look normal. Nose: External nose is healthy in appearance. Internal nasal exam free of any lesions or obstruction. Oral Cavity/pharynx:  There are no mucosal lesions or masses identified. Hypopharynx/Larynx: no signs of any mucosal lesions or masses identified. Vocal cords move normally. Neuro:  No identifiable neurologic deficits. Neck: Small tracheocutaneous fistula present. No palpable neck masses.  Studies Reviewed: none    Assessment/Plan Proceed with closure of tracheocutaneous fistula.  Clark Cuff 07/24/2016, 12:08 PM

## 2016-08-01 ENCOUNTER — Encounter (HOSPITAL_COMMUNITY): Payer: Self-pay | Admitting: Otolaryngology

## 2016-08-01 NOTE — Anesthesia Postprocedure Evaluation (Addendum)
Anesthesia Post Note  Patient: Crystal Spence  Procedure(s) Performed: Procedure(s) (LRB): TRACHEOCUTANEOUS FISTULA REPAIR (N/A)  Patient location during evaluation: PACU Anesthesia Type: General Level of consciousness: awake Pain management: pain level controlled Vital Signs Assessment: post-procedure vital signs reviewed and stable Respiratory status: spontaneous breathing Cardiovascular status: stable Postop Assessment: no signs of nausea or vomiting Anesthetic complications: no        Last Vitals:  Vitals:   07/31/16 1244 07/31/16 1253  BP:  (!) 109/91  Pulse: 85 86  Resp:  16  Temp: 36.7 C     Last Pain:  Vitals:   07/31/16 1253  TempSrc:   PainSc: 0-No pain   Pain Goal:                 Jadzia Ibsen JR,JOHN Kida Digiulio

## 2016-10-18 NOTE — Addendum Note (Signed)
Addendum  created 10/18/16 1119 by Candice Lunney, MD   Sign clinical note    

## 2017-01-20 ENCOUNTER — Emergency Department (HOSPITAL_COMMUNITY)
Admission: EM | Admit: 2017-01-20 | Discharge: 2017-01-20 | Disposition: A | Discharge status: Home / Self Care | Payer: Medicaid Other | Attending: Emergency Medicine | Admitting: Emergency Medicine

## 2017-01-20 ENCOUNTER — Emergency Department (HOSPITAL_COMMUNITY): Payer: Medicaid Other

## 2017-01-20 ENCOUNTER — Encounter (HOSPITAL_COMMUNITY): Payer: Self-pay | Admitting: Emergency Medicine

## 2017-01-20 DIAGNOSIS — R109 Unspecified abdominal pain: Secondary | ICD-10-CM

## 2017-01-20 LAB — COMPREHENSIVE METABOLIC PANEL
ALT: 52 U/L (ref 14–54)
AST: 81 U/L — ABNORMAL HIGH (ref 15–41)
Albumin: 3.6 g/dL (ref 3.5–5.0)
Alkaline Phosphatase: 136 U/L — ABNORMAL HIGH (ref 38–126)
Anion gap: 11 (ref 5–15)
BUN: 6 mg/dL (ref 6–20)
CO2: 26 mmol/L (ref 22–32)
Calcium: 9.6 mg/dL (ref 8.9–10.3)
Chloride: 99 mmol/L — ABNORMAL LOW (ref 101–111)
Creatinine, Ser: 1 mg/dL (ref 0.44–1.00)
GFR calc Af Amer: >60 mL/min (ref >60)
GFR calc non Af Amer: >60 mL/min (ref >60)
Glucose, Bld: 91 mg/dL (ref 65–99)
Potassium: 3.3 mmol/L — ABNORMAL LOW (ref 3.5–5.1)
Sodium: 136 mmol/L (ref 135–145)
Total Bilirubin: 0.9 mg/dL (ref 0.3–1.2)
Total Protein: 6 g/dL — ABNORMAL LOW (ref 6.5–8.1)

## 2017-01-20 LAB — CBC
HCT: 34.4 % — ABNORMAL LOW (ref 36.0–46.0)
Hemoglobin: 10.2 g/dL — ABNORMAL LOW (ref 12.0–15.0)
MCH: 26.4 pg (ref 26.0–34.0)
MCHC: 29.7 g/dL — ABNORMAL LOW (ref 30.0–36.0)
MCV: 89.1 fL (ref 78.0–100.0)
Platelets: 377 10*3/uL (ref 150–400)
RBC: 3.86 MIL/uL — ABNORMAL LOW (ref 3.87–5.11)
RDW: 21.9 % — ABNORMAL HIGH (ref 11.5–15.5)
WBC: 3.8 10*3/uL — ABNORMAL LOW (ref 4.0–10.5)

## 2017-01-20 LAB — I-STAT BETA HCG BLOOD, ED (MC, WL, AP ONLY): I-stat hCG, quantitative: <5 m[IU]/mL (ref <5)

## 2017-01-20 LAB — LIPASE, BLOOD: Lipase: 41 U/L (ref 11–51)

## 2017-01-20 MED ORDER — IOPAMIDOL (ISOVUE-300) INJECTION 61%
INTRAVENOUS | Status: AC
  Administered 2017-01-20: 100 mL
  Filled 2017-01-20: qty 100

## 2017-01-20 MED ORDER — SODIUM CHLORIDE 0.9 % IV BOLUS (SEPSIS)
1000.0000 mL | Freq: Once | INTRAVENOUS | Status: AC
  Administered 2017-01-20: 1000 mL via INTRAVENOUS

## 2017-01-20 MED ORDER — MORPHINE SULFATE (PF) 4 MG/ML IV SOLN
4.0000 mg | Freq: Once | INTRAVENOUS | Status: AC
  Administered 2017-01-20: 4 mg via INTRAVENOUS
  Filled 2017-01-20: qty 1

## 2017-01-20 MED ORDER — TRAMADOL HCL 50 MG PO TABS: 50.0000 mg | ORAL_TABLET | Freq: Four times a day (QID) | ORAL | 0 refills | Status: DC | PRN

## 2017-01-20 MED ORDER — ONDANSETRON HCL 4 MG/2ML IJ SOLN
4.0000 mg | Freq: Once | INTRAMUSCULAR | Status: AC
  Administered 2017-01-20: 4 mg via INTRAVENOUS
  Filled 2017-01-20: qty 2

## 2017-01-20 MED ORDER — ONDANSETRON HCL 4 MG PO TABS: 4.0000 mg | ORAL_TABLET | Freq: Four times a day (QID) | ORAL | 0 refills | Status: DC

## 2017-01-20 NOTE — ED Provider Notes (Signed)
Assumed care of patient from Dr. Wilson Singer.  See his note for full H&P.  Plan:  CT pending.  If negative, d/c home with symptomatic treatment.  Results for orders placed or performed during the hospital encounter of 01/20/17  Lipase, blood  Result Value Ref Range   Lipase 41 11 - 51 U/L  Comprehensive metabolic panel  Result Value Ref Range   Sodium 136 135 - 145 mmol/L   Potassium 3.3 (L) 3.5 - 5.1 mmol/L   Chloride 99 (L) 101 - 111 mmol/L   CO2 26 22 - 32 mmol/L   Glucose, Bld 91 65 - 99 mg/dL   BUN 6 6 - 20 mg/dL   Creatinine, Ser 1.00 0.44 - 1.00 mg/dL   Calcium 9.6 8.9 - 10.3 mg/dL   Total Protein 6.0 (L) 6.5 - 8.1 g/dL   Albumin 3.6 3.5 - 5.0 g/dL   AST 81 (H) 15 - 41 U/L   ALT 52 14 - 54 U/L   Alkaline Phosphatase 136 (H) 38 - 126 U/L   Total Bilirubin 0.9 0.3 - 1.2 mg/dL   GFR calc non Af Amer >60 >60 mL/min   GFR calc Af Amer >60 >60 mL/min   Anion gap 11 5 - 15  CBC  Result Value Ref Range   WBC 3.8 (L) 4.0 - 10.5 K/uL   RBC 3.86 (L) 3.87 - 5.11 MIL/uL   Hemoglobin 10.2 (L) 12.0 - 15.0 g/dL   HCT 34.4 (L) 36.0 - 46.0 %   MCV 89.1 78.0 - 100.0 fL   MCH 26.4 26.0 - 34.0 pg   MCHC 29.7 (L) 30.0 - 36.0 g/dL   RDW 21.9 (H) 11.5 - 15.5 %   Platelets 377 150 - 400 K/uL  I-Stat Beta hCG blood, ED ([facility], [facility], [facility] only)  Result Value Ref Range   I-stat hCG, quantitative <5.0 <5 mIU/mL   Comment 3           Ct Abdomen Pelvis W Contrast  Result Date: 01/20/2017 CLINICAL DATA:  Severe right-sided abdominal pain with nausea, vomiting and diarrhea for 2 days. EXAM: CT ABDOMEN AND PELVIS WITH CONTRAST TECHNIQUE: Multidetector CT imaging of the abdomen and pelvis was performed using the standard protocol following bolus administration of intravenous contrast. CONTRAST:  <See Chart> ISOVUE-300 IOPAMIDOL (ISOVUE-300) INJECTION 61% COMPARISON:  Prior study 12/06/2013 FINDINGS: Lower chest: Right basilar scarring changes and multiple calcified granulomas. No infiltrates or effusions. The  heart is mildly enlarged but appears stable. No pericardial effusion. The distal esophagus is grossly normal. Hepatobiliary: No focal hepatic lesions or intrahepatic biliary dilatation. The gallbladder demonstrates 1 small gallstone. No findings for acute cholecystitis. No common bile duct dilatation. Pancreas: No mass, inflammation or ductal dilatation. Spleen: Normal size. No focal lesions. Small calcified granulomas are noted. Adrenals/Urinary Tract: The adrenal glands and kidneys are unremarkable. No renal, ureteral or bladder calculi or mass. Stomach/Bowel: The stomach, duodenum, small bowel and colon are grossly normal. No acute inflammatory process, mass lesions or obstructive findings. Moderate fluid noted in the right colon. The terminal ileum is normal. The appendix is normal. There is a tubal ligation clip noted in the right lower quadrant near the appendix. The other tubal ligation clip is near the uterus. Vascular/Lymphatic: The aorta is normal in caliber. No dissection. The branch vessels are patent. The major venous structures are patent. No mesenteric or retroperitoneal mass or adenopathy. Small scattered lymph nodes are noted. Again demonstrated are dilated tortuous left gonadal veins Reproductive: The uterus and  ovaries are unremarkable. Other: Small to moderate free pelvic fluid could be due to menstruation or a ruptured ovarian cyst. Musculoskeletal: No significant bony findings. IMPRESSION: 1. Cholelithiasis but no findings for acute cholecystitis. 2. Small to moderate free pelvic fluid possibly from a ruptured ovarian cyst. 3. Normal wound in cecum, terminal ileum and appendix. 4. Stable dilated tortuous left gonadal veins. Electronically Signed   By: Marijo Sanes M.D.   On: 01/20/2017 21:14    9:46 PM CT scan with findings of gallstones but no acute cholecystitis. Small amount of free fluid in the pelvis, questionable ruptured cyst. No other acute findings. Patient resting, but here. Will  plan to discharge home with prescriptions and follow-up instructions per Dr. Gwynn Burly, Vilinda Blanks, PA-C 01/20/17 2245    Virgel Manifold, MD 01/31/17 [phone]

## 2017-01-20 NOTE — ED Notes (Signed)
Attempted re-check pts V/S pt refused

## 2017-01-20 NOTE — ED Provider Notes (Signed)
[facility] DEPT Provider Note   CSN: [id number] Arrival date & time: 01/20/17  1129     History   Chief Complaint Chief Complaint  Patient presents with  . Abdominal Pain    HPI Crystal Spence is a 41 y.o. female.  HPI   41 year old female with abdominal pain. She has a past history of laryngeal cancer treated with chemotherapy and radiation in 2012, hypertension, hypothyroidism.Her abdominal pain began about 2 days ago. It is generalized. Associated with nausea, vomiting and diarrhea. Her pain does not particularly localize. Fevers or chills. No urinary complaints. No sick contacts.She's tried taking Kaopectate without improvement.  Past Medical History:  Diagnosis Date  . Anemia, unspecified 06/17/2013  . Headache   . History of laryngeal cancer 02/03/2013  . Hypertension   . Hypothyroidism (acquired) 06/17/2013  . Multiple lung nodules 06/28/2014  . Pneumonia 01/14/2014, 04/2017  . Poor oral hygiene 06/17/2013  . Rib pain 01/14/2014  . Rib pain on left side 01/05/2014  . Throat cancer [facility])     Patient Active Problem List   Diagnosis Date Noted  . Medication monitoring encounter 04/29/2016  . Protein-calorie malnutrition, severe 03/19/2016  . Chronic anemia   . Thrombocytosis ([place])   . Abscess of lower lobe of right lung with pneumonia ([place])   . Hypoxemia   . Poor dentition   . Pulmonary abscess ([place]) 03/17/2016  . Dehydration 03/17/2016  . Abnormal LFTs 12/14/2015  . Iron deficiency anemia 12/04/2015  . Multiple lung nodules 06/28/2014  . Other type of nonintractable migraine 06/28/2014  . Community acquired pneumonia of right lower lobe of lung ([place]) 01/14/2014  . Rib pain 01/14/2014  . Rib pain on left side 01/05/2014  . Unexplained weight loss 01/05/2014  . Anemia in chronic illness 06/17/2013  . Hypothyroidism (acquired) 06/17/2013  . Poor oral hygiene 06/17/2013  . Acute blood loss anemia 02/03/2013  . Syncope 02/03/2013  . Hyponatremia 02/03/2013  .  History of laryngeal cancer 02/03/2013  . Hypokalemia 02/02/2013  . 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 Upper GI bleed 02/02/2013    Past Surgical History:  Procedure Laterality Date  . ABDOMINAL SURGERY    . COLONOSCOPY WITH PROPOFOL N/A 10/12/2015   Procedure: COLONOSCOPY WITH PROPOFOL;  Surgeon: Wonda Horner, MD;  Location[facility];  Service: Endoscopy;  Laterality: N/A;  . ESOPHAGOGASTRODUODENOSCOPY N/A 02/03/2013   Procedure: ESOPHAGOGASTRODUODENOSCOPY (EGD);  Surgeon: Juanita Craver, MD;  [facility];  Service: Endoscopy;  Laterality: N/A;  . ESOPHAGOGASTRODUODENOSCOPY N/A 10/12/2015   Procedure: ESOPHAGOGASTRODUODENOSCOPY (EGD);  Surgeon: Wonda Horner, MD;  Location: [facility];  Service: Endoscopy;  Laterality: N/A;  . 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 TRACHEOSTOMY     for throat inflammation from radiation  . TUBAL LIGATION      OB History    No data available       Home Medications    Prior to Admission medications   Medication Sig Start Date End Date Taking? Authorizing Provider  acetaminophen (TYLENOL) 500 MG tablet Take 1 tablet (500 mg total) by mouth every 4 (four) hours as needed for pain. Patient taking differently: Take 1,000 mg by mouth every 4 (four) hours as needed for mild pain.  02/05/13   Eugenie Filler, MD  butalbital-acetaminophen-caffeine (FIORICET, ESGIC) [phone] MG tablet Take 1 tablet by mouth 3 (three) times daily as needed for  headache or migraine.  07/28/15   [provider]  CARTIA XT 240 MG 24 hr capsule Take 240 mg by mouth daily. 09/06/15   [provider]  cephALEXin (KEFLEX) 500 MG capsule Take 1 capsule (500 mg total) by mouth 3 (three) times daily. 07/31/16   Izora Gala, MD  Cholecalciferol (VITAMIN D3) 2000 units capsule Take 2,000 Units by mouth daily.    [provider]  clonazePAM (KLONOPIN) 0.5 MG tablet Take 0.5 mg by mouth daily as needed for anxiety. 12/15/15   [provider]  etodolac (LODINE) 500 MG tablet Take 500 mg by mouth daily.    [provider]  HYDROcodone-acetaminophen (NORCO) 7.5-325 MG tablet Take 1 tablet by mouth every 6 (six) hours as needed for moderate pain. 07/31/16   Izora Gala, MD  ibuprofen (ADVIL,MOTRIN) 800 MG tablet Take 800 mg by mouth every 8 (eight) hours as needed for moderate pain.    [provider]  levothyroxine (SYNTHROID, LEVOTHROID) 75 MCG tablet Take 1 tablet (75 mcg total) by mouth daily before breakfast. 03/20/16   Verlee Monte, MD  losartan (COZAAR) 25 MG tablet Take 25 mg by mouth daily. 06/28/15   [provider]  montelukast (SINGULAIR) 10 MG tablet TAKE 1 TABLET BY MOUTH AT BEDTIME 11/12/15   [provider]  omeprazole (PRILOSEC) 20 MG capsule Take 20 mg by mouth daily. 08/01/15   [provider]  polyethylene glycol powder (GLYCOLAX/MIRALAX) powder Take 17 g by mouth daily as needed for mild constipation.  08/01/15   [provider]  potassium chloride (K-DUR,KLOR-CON) 10 MEQ tablet TAKE 1 TABLET TWICE DAILY 07/19/16   [provider]  topiramate (TOPAMAX) 100 MG tablet TAKE 1 TABLET BY MOUTH TWICE DAILY 11/16/15   [provider]  traZODone (DESYREL) 100 MG tablet Take 100 mg by mouth at bedtime. 08/15/15   [provider]    Family History Family History  Problem Relation Age of Onset  . Cancer Maternal Grandmother        skin cancer    Social History Social History  Substance Use Topics  . Smoking status: Former Smoker    Packs/day: 1.00    Years: 15.00    Quit date: 09/11/2010  . Smokeless tobacco: Never Used  . Alcohol use Yes     Comment: occasionally     Allergies   No known allergies   Review of Systems Review of Systems  All systems reviewed and negative, other than as noted in  HPI. Physical Exam Updated Vital Signs BP (!) 145/92 (BP Location: Right Arm)   Pulse 88   Temp 98.2 F (36.8 C) (Oral)   Resp 16   SpO2 97%   Physical Exam  Constitutional: No distress.  Laying in bed. Frail and chronically ill appearing but not toxic.  HENT:  Head: Normocephalic and atraumatic.  Eyes: Conjunctivae are normal. Right eye exhibits no discharge. Left eye exhibits no discharge.  Neck: Neck supple.  Cardiovascular: Normal rate, regular rhythm and normal heart sounds.  Exam reveals no gallop and no friction rub.   No murmur heard. Pulmonary/Chest: Effort normal and breath sounds normal. No respiratory distress.  Abdominal: Soft. She exhibits no distension. There is tenderness.  Diffuse abdominal tenderness, worse in the left abdomen. No rebound or guarding. No distention.  Musculoskeletal: She exhibits no edema or tenderness.  Neurological: She is alert.  Skin: Skin is warm and dry.  Psychiatric: She has a normal mood and affect. Her  behavior is normal. Thought content normal.  Nursing note and vitals reviewed.    ED Treatments / Results  Labs (all labs ordered are listed, but only abnormal results are displayed) Labs Reviewed  COMPREHENSIVE METABOLIC PANEL - Abnormal; Notable for the following:       Result Value   Potassium 3.3 (*)    Chloride 99 (*)    Total Protein 6.0 (*)    AST 81 (*)    Alkaline Phosphatase 136 (*)    All other components within normal limits  CBC - Abnormal; Notable for the following:    WBC 3.8 (*)    RBC 3.86 (*)    Hemoglobin 10.2 (*)    HCT 34.4 (*)    MCHC 29.7 (*)    RDW 21.9 (*)    All other components within normal limits  LIPASE, BLOOD  URINALYSIS, ROUTINE W REFLEX MICROSCOPIC  I-STAT BETA HCG BLOOD, ED ([facility], [facility], [facility] ONLY)    EKG  EKG Interpretation None       Radiology No results found.  Procedures Procedures (including critical care time)  Medications Ordered in ED Medications  sodium chloride 0.9 %  bolus 1,000 mL (1,000 mLs Intravenous New Bag/Given 01/20/17 1938)  morphine 4 MG/ML injection 4 mg (4 mg Intravenous Given 01/20/17 1936)  ondansetron (ZOFRAN) injection 4 mg (4 mg Intravenous Given 01/20/17 1936)  iopamidol (ISOVUE-300) 61 % injection (100 mLs  Contrast Given 01/20/17 2041)     Initial Impression / Assessment and Plan / ED Course  I have reviewed the triage vital signs and the nursing notes.  Pertinent labs & imaging results that were available during my care of the patient were reviewed by me and considered in my medical decision making (see chart for details).     41 year old female with diffuse abdominal pain for the past 2 days. She reports diffuse abdominal pain but she is somewhat more tender in the left abdomen on exam. She is afebrile. No peritonitis. Last really unremarkable aside from mild elevation in LFTs and minimal hypokalemia. Will treat her symptoms. CT abdomen and pelvis. Disposition pending these results. Symptomatic treatment otherwise.  Final Clinical Impressions(s) / ED Diagnoses   Final diagnoses:  Abdominal pain, unspecified abdominal location    New Prescriptions New Prescriptions   No medications on file     Virgel Manifold, MD 02/07/17 1354

## 2017-01-20 NOTE — ED Triage Notes (Signed)
Pt reports generalized abdominal pain, nausea, vomiting, diarrhea for the last 2 days. Reports has had this problem in the past. Pt appears chronically ill. No acute distress noted at this time. VSS.

## 2017-02-19 ENCOUNTER — Encounter (HOSPITAL_COMMUNITY): Payer: Self-pay | Admitting: Emergency Medicine

## 2017-02-19 DIAGNOSIS — R112 Nausea with vomiting, unspecified: Secondary | ICD-10-CM | POA: Diagnosis not present

## 2017-02-19 DIAGNOSIS — R197 Diarrhea, unspecified: Secondary | ICD-10-CM | POA: Insufficient documentation

## 2017-02-19 DIAGNOSIS — Z79899 Other long term (current) drug therapy: Secondary | ICD-10-CM | POA: Insufficient documentation

## 2017-02-19 DIAGNOSIS — J209 Acute bronchitis, unspecified: Secondary | ICD-10-CM | POA: Diagnosis not present

## 2017-02-19 DIAGNOSIS — R05 Cough: Secondary | ICD-10-CM | POA: Diagnosis present

## 2017-02-19 DIAGNOSIS — Z87891 Personal history of nicotine dependence: Secondary | ICD-10-CM | POA: Diagnosis not present

## 2017-02-19 DIAGNOSIS — I10 Essential (primary) hypertension: Secondary | ICD-10-CM | POA: Diagnosis not present

## 2017-02-19 LAB — COMPREHENSIVE METABOLIC PANEL
ALBUMIN: 3.2 g/dL — AB (ref 3.5–5.0)
ALK PHOS: 220 U/L — AB (ref 38–126)
ALT: 72 U/L — ABNORMAL HIGH (ref 14–54)
AST: 179 U/L — AB (ref 15–41)
Anion gap: 10 (ref 5–15)
BILIRUBIN TOTAL: 0.8 mg/dL (ref 0.3–1.2)
CO2: 23 mmol/L (ref 22–32)
Calcium: 9.3 mg/dL (ref 8.9–10.3)
Chloride: 106 mmol/L (ref 101–111)
Creatinine, Ser: 0.77 mg/dL (ref 0.44–1.00)
GFR calc Af Amer: >60 mL/min (ref >60)
GFR calc non Af Amer: >60 mL/min (ref >60)
GLUCOSE: 85 mg/dL (ref 65–99)
POTASSIUM: 3.1 mmol/L — AB (ref 3.5–5.1)
Sodium: 139 mmol/L (ref 135–145)
TOTAL PROTEIN: 6.3 g/dL — AB (ref 6.5–8.1)

## 2017-02-19 LAB — URINALYSIS, ROUTINE W REFLEX MICROSCOPIC
BILIRUBIN URINE: NEGATIVE
Glucose, UA: NEGATIVE mg/dL
HGB URINE DIPSTICK: NEGATIVE
Ketones, ur: NEGATIVE mg/dL
Nitrite: POSITIVE — AB
PROTEIN: NEGATIVE mg/dL
Specific Gravity, Urine: 1.011 (ref 1.005–1.030)
pH: 7 (ref 5.0–8.0)

## 2017-02-19 LAB — CBC WITH DIFFERENTIAL/PLATELET
Basophils Absolute: 0.1 10*3/uL (ref 0.0–0.1)
Basophils Relative: 1 %
EOS PCT: 1 %
Eosinophils Absolute: 0.1 10*3/uL (ref 0.0–0.7)
HEMATOCRIT: 33.6 % — AB (ref 36.0–46.0)
HEMOGLOBIN: 10.3 g/dL — AB (ref 12.0–15.0)
LYMPHS ABS: 0.4 10*3/uL — AB (ref 0.7–4.0)
LYMPHS PCT: 11 %
MCH: 28.2 pg (ref 26.0–34.0)
MCHC: 30.7 g/dL (ref 30.0–36.0)
MCV: 92.1 fL (ref 78.0–100.0)
MONO ABS: 0.5 10*3/uL (ref 0.1–1.0)
Monocytes Relative: 15 %
NEUTROS ABS: 2.5 10*3/uL (ref 1.7–7.7)
Neutrophils Relative %: 71 %
Platelets: 322 10*3/uL (ref 150–400)
RBC: 3.65 MIL/uL — ABNORMAL LOW (ref 3.87–5.11)
RDW: 24 % — AB (ref 11.5–15.5)
WBC: 3.5 10*3/uL — ABNORMAL LOW (ref 4.0–10.5)

## 2017-02-19 LAB — LIPASE, BLOOD: Lipase: 23 U/L (ref 11–51)

## 2017-02-19 NOTE — ED Triage Notes (Signed)
Pt is c/o cough that she has had for the past 2 weeks  Pt states she has been coughing up clear to green phlegm  Pt is also c/o diarrhea and vomiting  Pt states she is having LUQ pain  Pt states she was diagnosed with gallstones a couple weeks ago

## 2017-02-20 ENCOUNTER — Emergency Department (HOSPITAL_COMMUNITY): Payer: Medicaid Other

## 2017-02-20 ENCOUNTER — Emergency Department (HOSPITAL_COMMUNITY)
Admission: EM | Admit: 2017-02-20 | Discharge: 2017-02-20 | Disposition: A | Discharge status: Home / Self Care | Payer: Medicaid Other | Attending: Emergency Medicine | Admitting: Emergency Medicine

## 2017-02-20 DIAGNOSIS — R197 Diarrhea, unspecified: Secondary | ICD-10-CM

## 2017-02-20 DIAGNOSIS — R112 Nausea with vomiting, unspecified: Secondary | ICD-10-CM

## 2017-02-20 DIAGNOSIS — J40 Bronchitis, not specified as acute or chronic: Secondary | ICD-10-CM

## 2017-02-20 MED ORDER — IPRATROPIUM-ALBUTEROL 0.5-2.5 (3) MG/3ML IN SOLN: 3.0000 mL | Freq: Once | RESPIRATORY_TRACT | Status: DC

## 2017-02-20 MED ORDER — PREDNISONE 20 MG PO TABS
60.0000 mg | ORAL_TABLET | Freq: Once | ORAL | Status: AC
  Administered 2017-02-20: 60 mg via ORAL
  Filled 2017-02-20: qty 3

## 2017-02-20 MED ORDER — ALBUTEROL SULFATE HFA 108 (90 BASE) MCG/ACT IN AERS: 2.0000 | INHALATION_SPRAY | RESPIRATORY_TRACT | 0 refills | Status: DC | PRN

## 2017-02-20 MED ORDER — DOXYCYCLINE HYCLATE 100 MG PO TABS
100.0000 mg | ORAL_TABLET | Freq: Once | ORAL | Status: AC
  Administered 2017-02-20: 100 mg via ORAL
  Filled 2017-02-20: qty 1

## 2017-02-20 MED ORDER — IPRATROPIUM-ALBUTEROL 0.5-2.5 (3) MG/3ML IN SOLN
3.0000 mL | Freq: Once | RESPIRATORY_TRACT | Status: AC
  Administered 2017-02-20: 3 mL via RESPIRATORY_TRACT
  Filled 2017-02-20: qty 3

## 2017-02-20 MED ORDER — ALBUTEROL SULFATE (2.5 MG/3ML) 0.083% IN NEBU
5.0000 mg | INHALATION_SOLUTION | Freq: Once | RESPIRATORY_TRACT | Status: AC
  Administered 2017-02-20: 5 mg via RESPIRATORY_TRACT
  Filled 2017-02-20: qty 6

## 2017-02-20 MED ORDER — DOXYCYCLINE HYCLATE 100 MG PO CAPS: 100.0000 mg | ORAL_CAPSULE | Freq: Two times a day (BID) | ORAL | 0 refills | Status: DC

## 2017-02-20 MED ORDER — PREDNISONE 50 MG PO TABS: ORAL_TABLET | ORAL | 0 refills | Status: DC

## 2017-02-20 NOTE — ED Notes (Signed)
Pt. Ambulated down the hall and back to her room without difficulty.pt. Gait steady on her feet.

## 2017-02-20 NOTE — ED Provider Notes (Signed)
[facility] DEPT Provider Note   CSN: [id number] Arrival date & time: 02/19/17  1813     History   Chief Complaint Chief Complaint  Patient presents with  . Cough  . Emesis    HPI Crystal Spence is a 41 y.o. female.  The history is provided by the patient.  Cough  This is a new problem. The current episode started more than 1 week ago. The problem occurs every few minutes. The problem has been gradually worsening. The cough is non-productive. There has been no fever. Associated symptoms include shortness of breath and wheezing. She is a smoker.  Emesis   Associated symptoms include cough.  patient with previous h/o laryngeal CA, s/p chemo/radiation and she had previous trach Presents with nonproductive cough/sob/wheezing for 2 weeks She also reports LUQ/lower chest pain from cough She also reports nonbloody emesis/diarrhea for past week She also endorses HA/sore throat  She is a smoker  Past Medical History:  Diagnosis Date  . Anemia, unspecified 06/17/2013  . Headache   . History of laryngeal cancer 02/03/2013  . Hypertension   . Hypothyroidism (acquired) 06/17/2013  . Multiple lung nodules 06/28/2014  . Pneumonia 01/14/2014, 04/2017  . Poor oral hygiene 06/17/2013  . Rib pain 01/14/2014  . Rib pain on left side 01/05/2014  . Throat cancer [facility])     Patient Active Problem List   Diagnosis Date Noted  . Medication monitoring encounter 04/29/2016  . Protein-calorie malnutrition, severe 03/19/2016  . Chronic anemia   . Thrombocytosis ([place])   . Abscess of lower lobe of right lung with pneumonia ([place])   . Hypoxemia   . Poor dentition   . Pulmonary abscess ([place]) 03/17/2016  . Dehydration 03/17/2016  . Abnormal LFTs 12/14/2015  . Iron deficiency anemia 12/04/2015  . Multiple lung nodules 06/28/2014  . Other type of nonintractable migraine 06/28/2014  . Community acquired pneumonia of right lower lobe of lung ([place]) 01/14/2014  . Rib pain 01/14/2014  . Rib pain on  left side 01/05/2014  . Unexplained weight loss 01/05/2014  . Anemia in chronic illness 06/17/2013  . Hypothyroidism (acquired) 06/17/2013  . Poor oral hygiene 06/17/2013  . Acute blood loss anemia 02/03/2013  . Syncope 02/03/2013  . Hyponatremia 02/03/2013  . History of laryngeal cancer 02/03/2013  . Hypokalemia 02/02/2013  . 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 Upper GI bleed 02/02/2013    Past Surgical History:  Procedure Laterality Date  . ABDOMINAL SURGERY    . COLONOSCOPY WITH PROPOFOL N/A 10/12/2015   Procedure: COLONOSCOPY WITH PROPOFOL;  Surgeon: Wonda Horner, MD;  [facility];  Service: Endoscopy;  Laterality: N/A;  . ESOPHAGOGASTRODUODENOSCOPY N/A 02/03/2013   Procedure: ESOPHAGOGASTRODUODENOSCOPY (EGD);  Surgeon: Juanita Craver, MD;  [facility];  Service: Endoscopy;  Laterality: N/A;  . ESOPHAGOGASTRODUODENOSCOPY N/A 10/12/2015   Procedure: ESOPHAGOGASTRODUODENOSCOPY (EGD);  Surgeon: Wonda Horner, MD;  Location: [facility];  Service: Endoscopy;  Laterality: N/A;  . 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 TRACHEOSTOMY     for throat inflammation from radiation  . TUBAL LIGATION      OB History    No data available       Home Medications    Prior to Admission medications   Medication Sig Start Date End Date Taking? Authorizing Provider  acetaminophen (TYLENOL) 500 MG tablet Take 1 tablet (500 mg total) by  mouth every 4 (four) hours as needed for pain. Patient taking differently: Take 1,000 mg by mouth every 4 (four) hours as needed for mild pain.  02/05/13  Yes Eugenie Filler, MD  butalbital-acetaminophen-caffeine (FIORICET, ESGIC) [phone] MG tablet Take 1 tablet by mouth 3 (three) times daily as needed for headache or migraine.  07/28/15  Yes [provider]  CARTIA XT 240 MG 24 hr capsule Take 240 mg by  mouth daily. 09/06/15  Yes [provider]  clonazePAM (KLONOPIN) 0.5 MG tablet Take 0.5 mg by mouth daily as needed for anxiety. 12/15/15  Yes [provider]  ferrous sulfate 325 (65 FE) MG tablet Take 325 mg by mouth daily with breakfast.   Yes [provider]  ibuprofen (ADVIL,MOTRIN) 800 MG tablet Take 800 mg by mouth every 8 (eight) hours as needed for moderate pain.   Yes [provider]  levothyroxine (SYNTHROID, LEVOTHROID) 50 MCG tablet Take 50 mcg by mouth daily before breakfast.   Yes [provider]  losartan (COZAAR) 25 MG tablet Take 25 mg by mouth daily. 06/28/15  Yes [provider]  montelukast (SINGULAIR) 10 MG tablet TAKE 1 TABLET BY MOUTH AT BEDTIME 11/12/15  Yes [provider]  ondansetron (ZOFRAN) 4 MG tablet Take 1 tablet (4 mg total) by mouth every 6 (six) hours. Patient taking differently: Take 4 mg by mouth every 8 (eight) hours as needed for nausea.  01/20/17  Yes Virgel Manifold, MD  potassium chloride SA (K-DUR,KLOR-CON) 20 MEQ tablet Take 20 mEq by mouth 2 (two) times daily.   Yes [provider]  topiramate (TOPAMAX) 50 MG tablet Take 100 mg by mouth 2 (two) times daily.    Yes [provider]  traZODone (DESYREL) 100 MG tablet Take 100 mg by mouth at bedtime. 08/15/15  Yes [provider]  cephALEXin (KEFLEX) 500 MG capsule Take 1 capsule (500 mg total) by mouth 3 (three) times daily. Patient not taking: Reported on 02/20/2017 07/31/16   Izora Gala, MD  HYDROcodone-acetaminophen [facility]) 7.5-325 MG tablet Take 1 tablet by mouth every 6 (six) hours as needed for moderate pain. Patient not taking: Reported on 02/20/2017 07/31/16   Izora Gala, MD  levothyroxine (SYNTHROID, LEVOTHROID) 75 MCG tablet Take 1 tablet (75 mcg total) by mouth daily before breakfast. Patient not taking: Reported on 02/20/2017 03/20/16   Verlee Monte, MD  traMADol (ULTRAM) 50 MG tablet Take 1 tablet (50 mg total)  by mouth every 6 (six) hours as needed. Patient not taking: Reported on 02/20/2017 01/20/17   Virgel Manifold, MD    Family History Family History  Problem Relation Age of Onset  . Cancer Maternal Grandmother        skin cancer    Social History Social History  Substance Use Topics  . Smoking status: Former Smoker    Packs/day: 1.00    Years: 15.00    Quit date: 09/11/2010  . Smokeless tobacco: Never Used  . Alcohol use Yes     Comment: rare     Allergies   No known allergies   Review of Systems Review of Systems  Respiratory: Positive for cough, shortness of breath and wheezing.   Gastrointestinal: Positive for vomiting.  All other systems reviewed and are negative.    Physical Exam Updated Vital Signs BP 101/69 (BP Location: Left Arm)   Pulse 71   Temp 98.2 F (36.8 C) (Oral)   Resp 16   SpO2 96%   Physical Exam CONSTITUTIONAL:  chronically ill appearing, appears older than stated age HEAD: Normocephalic/atraumatic EYES: EOMI/PERRL ENMT: Mucous membranes moist NECK: supple no meningeal signs, healed trach scar noted SPINE/BACK:entire spine nontender CV: S1/S2 noted, no murmurs/rubs/gallops noted LUNGS: wheezing bilaterally, tachypnea noted Chest - mild tenderness to left lower costal margin ABDOMEN: soft, nontender, no rebound or guarding, bowel sounds noted throughout abdomen GU:no cva tenderness NEURO: Pt is awake/alert/appropriate, moves all extremitiesx4.  No facial droop.   EXTREMITIES: pulses normal/equal, full ROM SKIN: warm, color normal PSYCH: no abnormalities of mood noted, alert and oriented to situation   ED Treatments / Results  Labs (all labs ordered are listed, but only abnormal results are displayed) Labs Reviewed  CBC WITH DIFFERENTIAL/PLATELET - Abnormal; Notable for the following:       Result Value   WBC 3.5 (*)    RBC 3.65 (*)    Hemoglobin 10.3 (*)    HCT 33.6 (*)    RDW 24.0 (*)    Lymphs Abs 0.4 (*)    All other  components within normal limits  COMPREHENSIVE METABOLIC PANEL - Abnormal; Notable for the following:    Potassium 3.1 (*)    BUN <5 (*)    Total Protein 6.3 (*)    Albumin 3.2 (*)    AST 179 (*)    ALT 72 (*)    Alkaline Phosphatase 220 (*)    All other components within normal limits  URINALYSIS, ROUTINE W REFLEX MICROSCOPIC - Abnormal; Notable for the following:    Color, Urine AMBER (*)    APPearance CLOUDY (*)    Nitrite POSITIVE (*)    Leukocytes, UA SMALL (*)    Bacteria, UA MANY (*)    Squamous Epithelial / LPF 6-30 (*)    All other components within normal limits  URINE CULTURE  LIPASE, BLOOD    EKG  EKG Interpretation None       Radiology Dg Chest 2 View  Result Date: 02/20/2017 CLINICAL DATA:  Acute onset of productive cough. Subacute onset of shortness of breath. Initial encounter. EXAM: CHEST  2 VIEW COMPARISON:  Chest radiograph performed 05/24/2016 FINDINGS: The lungs are well-aerated. Scattered calcified granulomata are noted bilaterally. There is no evidence of focal opacification, pleural effusion or pneumothorax. The heart is normal in size; the mediastinal contour is within normal limits. No acute osseous abnormalities are seen. Right convex thoracic scoliosis is noted. IMPRESSION: 1. No acute cardiopulmonary process seen. 2. Right convex thoracic scoliosis noted. Electronically Signed   By: Garald Balding M.D.   On: 02/20/2017 03:54    Procedures Procedures (including critical care time)  Medications Ordered in ED Medications  ipratropium-albuterol (DUONEB) 0.5-2.5 (3) MG/3ML nebulizer solution 3 mL (3 mLs Nebulization Given 02/20/17 0351)  predniSONE (DELTASONE) tablet 60 mg (60 mg Oral Given 02/20/17 0459)  albuterol (PROVENTIL) (2.5 MG/3ML) 0.083% nebulizer solution 5 mg (5 mg Nebulization Given 02/20/17 0501)  doxycycline (VIBRA-TABS) tablet 100 mg (100 mg Oral Given 02/20/17 5784)     Initial Impression / Assessment and Plan / ED Course  I  have reviewed the triage vital signs and the nursing notes.  Pertinent labs & imaging results that were available during my care of the patient were reviewed by me and considered in my medical decision making (see chart for details).     PT IN THE ED FOR COUGH FOR 2 WEEKS AND ALSO REPORTS VOMITING/DIARRHEA OVERALL SHE IS IMPROVED SHE AMBULATED WITHOUT DIFFICULTY WHEEZE IS IMPROVED NO DISTRESS IS NOTED I FEEL SHE IS  APPROPRIATE FOR OUTPATIENT MANAGEMENT ADVISED TO QUIT SMOKING WILL START DOXYCYCLINE/PREDNISONE/ALBUTEROL WE DISCUSSED STRICT ER RETURN PRECAUTIONS  GI SYMPTOMS IMPROVED, NO FOCAL ABD TENDERNESS, VOMITING RESOLVED   Final Clinical Impressions(s) / ED Diagnoses   Final diagnoses:  Bronchitis  Nausea vomiting and diarrhea    New Prescriptions Discharge Medication List as of 02/20/2017  6:10 AM    START taking these medications   Details  albuterol (PROVENTIL HFA;VENTOLIN HFA) 108 (90 Base) MCG/ACT inhaler Inhale 2 puffs into the lungs every 2 (two) hours as needed for wheezing or shortness of breath (cough)., Starting Thu 02/20/2017, Print    doxycycline (VIBRAMYCIN) 100 MG capsule Take 1 capsule (100 mg total) by mouth 2 (two) times daily. One po bid x 7 days, Starting Thu 02/20/2017, Print         Ripley Fraise, MD 02/20/17 [phone]

## 2017-02-22 LAB — URINE CULTURE

## 2017-02-23 ENCOUNTER — Telehealth: Payer: Self-pay

## 2017-02-23 NOTE — Telephone Encounter (Signed)
No treatment for [facility] ED 02/20/17 Per Salome Arnt Pharm D

## 2017-04-12 ENCOUNTER — Emergency Department (HOSPITAL_COMMUNITY)
Admission: EM | Admit: 2017-04-12 | Discharge: 2017-04-12 | Disposition: A | Discharge status: Home / Self Care | Payer: Medicaid Other | Attending: Emergency Medicine | Admitting: Emergency Medicine

## 2017-04-12 ENCOUNTER — Encounter (HOSPITAL_COMMUNITY): Payer: Self-pay | Admitting: Emergency Medicine

## 2017-04-12 ENCOUNTER — Emergency Department (HOSPITAL_COMMUNITY): Payer: Medicaid Other

## 2017-04-12 DIAGNOSIS — J069 Acute upper respiratory infection, unspecified: Secondary | ICD-10-CM | POA: Insufficient documentation

## 2017-04-12 DIAGNOSIS — R519 Headache, unspecified: Secondary | ICD-10-CM

## 2017-04-12 DIAGNOSIS — I10 Essential (primary) hypertension: Secondary | ICD-10-CM | POA: Insufficient documentation

## 2017-04-12 DIAGNOSIS — N3090 Cystitis, unspecified without hematuria: Secondary | ICD-10-CM | POA: Diagnosis not present

## 2017-04-12 DIAGNOSIS — R51 Headache: Secondary | ICD-10-CM | POA: Diagnosis not present

## 2017-04-12 DIAGNOSIS — J209 Acute bronchitis, unspecified: Secondary | ICD-10-CM

## 2017-04-12 DIAGNOSIS — M79605 Pain in left leg: Secondary | ICD-10-CM | POA: Diagnosis not present

## 2017-04-12 DIAGNOSIS — F17210 Nicotine dependence, cigarettes, uncomplicated: Secondary | ICD-10-CM | POA: Diagnosis not present

## 2017-04-12 DIAGNOSIS — E039 Hypothyroidism, unspecified: Secondary | ICD-10-CM | POA: Diagnosis not present

## 2017-04-12 DIAGNOSIS — M79604 Pain in right leg: Secondary | ICD-10-CM | POA: Diagnosis not present

## 2017-04-12 DIAGNOSIS — J42 Unspecified chronic bronchitis: Secondary | ICD-10-CM | POA: Diagnosis not present

## 2017-04-12 DIAGNOSIS — Z720 Tobacco use: Secondary | ICD-10-CM

## 2017-04-12 LAB — CBC
HCT: 30.2 % — ABNORMAL LOW (ref 36.0–46.0)
HEMOGLOBIN: 9.4 g/dL — AB (ref 12.0–15.0)
MCH: 29.1 pg (ref 26.0–34.0)
MCHC: 31.1 g/dL (ref 30.0–36.0)
MCV: 93.5 fL (ref 78.0–100.0)
PLATELETS: 214 10*3/uL (ref 150–400)
RBC: 3.23 MIL/uL — AB (ref 3.87–5.11)
RDW: 20.3 % — ABNORMAL HIGH (ref 11.5–15.5)
WBC: 7 10*3/uL (ref 4.0–10.5)

## 2017-04-12 LAB — COMPREHENSIVE METABOLIC PANEL
ALK PHOS: 122 U/L (ref 38–126)
ALT: 23 U/L (ref 14–54)
ANION GAP: 8 (ref 5–15)
AST: 48 U/L — ABNORMAL HIGH (ref 15–41)
Albumin: 2.8 g/dL — ABNORMAL LOW (ref 3.5–5.0)
BILIRUBIN TOTAL: 0.6 mg/dL (ref 0.3–1.2)
BUN: <5 mg/dL — ABNORMAL LOW (ref 6–20)
CALCIUM: 8.9 mg/dL (ref 8.9–10.3)
CO2: 19 mmol/L — AB (ref 22–32)
CREATININE: 0.89 mg/dL (ref 0.44–1.00)
Chloride: 108 mmol/L (ref 101–111)
GFR calc non Af Amer: >60 mL/min (ref >60)
GLUCOSE: 92 mg/dL (ref 65–99)
Potassium: 3.5 mmol/L (ref 3.5–5.1)
Sodium: 135 mmol/L (ref 135–145)
TOTAL PROTEIN: 5.7 g/dL — AB (ref 6.5–8.1)

## 2017-04-12 LAB — URINALYSIS, ROUTINE W REFLEX MICROSCOPIC
Bilirubin Urine: NEGATIVE
GLUCOSE, UA: NEGATIVE mg/dL
Hgb urine dipstick: NEGATIVE
Ketones, ur: NEGATIVE mg/dL
NITRITE: POSITIVE — AB
PH: 5 (ref 5.0–8.0)
PROTEIN: NEGATIVE mg/dL
SPECIFIC GRAVITY, URINE: 1.013 (ref 1.005–1.030)

## 2017-04-12 MED ORDER — ONDANSETRON 4 MG PO TBDP
4.0000 mg | ORAL_TABLET | Freq: Once | ORAL | Status: AC | PRN
  Administered 2017-04-12: 4 mg via ORAL
  Filled 2017-04-12: qty 1

## 2017-04-12 MED ORDER — PREDNISONE 20 MG PO TABS
60.0000 mg | ORAL_TABLET | Freq: Once | ORAL | Status: AC
  Administered 2017-04-12: 60 mg via ORAL
  Filled 2017-04-12: qty 3

## 2017-04-12 MED ORDER — PREDNISONE 10 MG (21) PO TBPK: ORAL_TABLET | Freq: Every day | ORAL | 0 refills | Status: DC

## 2017-04-12 MED ORDER — SULFAMETHOXAZOLE-TRIMETHOPRIM 800-160 MG PO TABS: 1.0000 | ORAL_TABLET | Freq: Two times a day (BID) | ORAL | 0 refills | Status: AC

## 2017-04-12 MED ORDER — SULFAMETHOXAZOLE-TRIMETHOPRIM 800-160 MG PO TABS
1.0000 | ORAL_TABLET | Freq: Once | ORAL | Status: AC
  Administered 2017-04-12: 1 via ORAL
  Filled 2017-04-12: qty 1

## 2017-04-12 MED ORDER — KETOROLAC TROMETHAMINE 30 MG/ML IJ SOLN
15.0000 mg | Freq: Once | INTRAMUSCULAR | Status: AC
  Administered 2017-04-12: 15 mg via INTRAVENOUS
  Filled 2017-04-12: qty 1

## 2017-04-12 MED ORDER — SODIUM CHLORIDE 0.9 % IV BOLUS (SEPSIS)
1000.0000 mL | Freq: Once | INTRAVENOUS | Status: AC
  Administered 2017-04-12: 1000 mL via INTRAVENOUS

## 2017-04-12 MED ORDER — KETOROLAC TROMETHAMINE 30 MG/ML IJ SOLN: 30.0000 mg | Freq: Once | INTRAMUSCULAR | Status: DC

## 2017-04-12 NOTE — ED Notes (Signed)
ED Provider at bedside. 

## 2017-04-12 NOTE — ED Provider Notes (Signed)
[facility] EMERGENCY DEPARTMENT Provider Note   CSN: [id number] Arrival date & time: 04/12/17  1919     History   Chief Complaint Chief Complaint  Patient presents with  . Leg Pain  . Headache    HPI Crystal Spence is a 41 y.o. female with a history of laryngeal cancer status post chemotherapy, radiation, tracheostomy with subsequent fistula repair, anemia, hypertension, who presents today for evaluation of multiple complaints.   Right leg: She reports that approximately 4 weeks ago she "twisted" her leg and it has not gotten better.  She reports that her pain is worse in the posterior thigh.  She denies any numbness or tingling, no changes to bowel or bladder function.  She denies any bowel or bladder changes.  She reports that she has seen her primary care provider for this and they referred her to another doctor, however she has not seen them yet.  She reports she has been having difficulty putting weight on her right leg which is causing her left leg to hurt more.  She has tried ibuprofen for her pain without relief.  She reports that when her pain initially started it started in her right knee.  Headahce/URI: She reports for the past week she has had a runny nose, cough with increased sputum, increased sputum purulence, increased cough frequency, runny nose, nasal congestion and generally not feeling well.  She denies any fevers at home.  She says that her headache is isolated to her forehead.  She says that her headache is worsened by positional changes.  Has not found anything that makes it better.  Attempted to take her Fioricet at home with mild relief.  She reports that she has a approximately 20-year smoking history, says that currently she smokes about half a pack a day.  HPI  Past Medical History:  Diagnosis Date  . Anemia, unspecified 06/17/2013  . Headache   . History of laryngeal cancer 02/03/2013  . Hypertension   . Hypothyroidism (acquired)  06/17/2013  . Multiple lung nodules 06/28/2014  . Pneumonia 01/14/2014, 04/2017  . Poor oral hygiene 06/17/2013  . Rib pain 01/14/2014  . Rib pain on left side 01/05/2014  . Throat cancer [facility])     Patient Active Problem List   Diagnosis Date Noted  . Medication monitoring encounter 04/29/2016  . Protein-calorie malnutrition, severe 03/19/2016  . Chronic anemia   . Thrombocytosis ([place])   . Abscess of lower lobe of right lung with pneumonia ([place])   . Hypoxemia   . Poor dentition   . Pulmonary abscess ([place]) 03/17/2016  . Dehydration 03/17/2016  . Abnormal LFTs 12/14/2015  . Iron deficiency anemia 12/04/2015  . Multiple lung nodules 06/28/2014  . Other type of nonintractable migraine 06/28/2014  . Community acquired pneumonia of right lower lobe of lung ([place]) 01/14/2014  . Rib pain 01/14/2014  . Rib pain on left side 01/05/2014  . Unexplained weight loss 01/05/2014  . Anemia in chronic illness 06/17/2013  . Hypothyroidism (acquired) 06/17/2013  . Poor oral hygiene 06/17/2013  . Acute blood loss anemia 02/03/2013  . Syncope 02/03/2013  . Hyponatremia 02/03/2013  . History of laryngeal cancer 02/03/2013  . Hypokalemia 02/02/2013  . 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 Upper GI bleed 02/02/2013    Past Surgical History:  Procedure Laterality Date  . ABDOMINAL SURGERY    . COLONOSCOPY WITH PROPOFOL N/A 10/12/2015   Procedure: COLONOSCOPY WITH PROPOFOL;  Surgeon: Wonda Horner, MD;  Location: [facility] ENDOSCOPY;  Service: Endoscopy;  Laterality: N/A;  . ESOPHAGOGASTRODUODENOSCOPY N/A 02/03/2013   Procedure: ESOPHAGOGASTRODUODENOSCOPY (EGD);  Surgeon: Juanita Craver, MD;  Location: [facility] ENDOSCOPY;  Service: Endoscopy;  Laterality: N/A;  . ESOPHAGOGASTRODUODENOSCOPY N/A 10/12/2015   Procedure: ESOPHAGOGASTRODUODENOSCOPY (EGD);  Surgeon: Wonda Horner, MD;  Location: [facility];  Service: Endoscopy;  Laterality: N/A;  . 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   Procedure: TRACHEOCUTANEOUS FISTULA  REPAIR;  Surgeon: Izora Gala, MD;  Location: [facility];  Service: ENT;  Laterality: N/A;  . TRACHEOSTOMY     for throat inflammation from radiation  . TUBAL LIGATION      OB History    No data available       Home Medications    Prior to Admission medications   Medication Sig Start Date End Date Taking? Authorizing Provider  acetaminophen (TYLENOL) 500 MG tablet Take 1 tablet (500 mg total) by mouth every 4 (four) hours as needed for pain. Patient taking differently: Take 1,000 mg by mouth every 4 (four) hours as needed for mild pain.  02/05/13  Yes Eugenie Filler, MD  albuterol (PROVENTIL HFA;VENTOLIN HFA) 108 (90 Base) MCG/ACT inhaler Inhale 2 puffs into the lungs every 2 (two) hours as needed for wheezing or shortness of breath (cough). 02/20/17  Yes Ripley Fraise, MD  butalbital-acetaminophen-caffeine (FIORICET, ESGIC) [phone] MG tablet Take 1 tablet by mouth 3 (three) times daily as needed for headache or migraine.  07/28/15  Yes [provider]  CARTIA XT 240 MG 24 hr capsule Take 240 mg by mouth daily. 09/06/15  Yes [provider]  clonazePAM (KLONOPIN) 0.5 MG tablet Take 0.5 mg by mouth daily as needed for anxiety. 12/15/15  Yes [provider]  levothyroxine (SYNTHROID, LEVOTHROID) 50 MCG tablet Take 50 mcg by mouth daily before breakfast.   Yes [provider]  losartan (COZAAR) 25 MG tablet Take 25 mg by mouth daily. 06/28/15  Yes [provider]  montelukast (SINGULAIR) 10 MG tablet TAKE 1 TABLET BY MOUTH AT BEDTIME 11/12/15  Yes [provider]  potassium chloride SA (K-DUR,KLOR-CON) 20 MEQ tablet Take 20 mEq by mouth 2 (two) times daily.   Yes [provider]  topiramate (TOPAMAX) 50 MG tablet Take 100 mg by mouth 2 (two) times daily.    Yes [provider]  traZODone (DESYREL) 100 MG tablet Take 100 mg by mouth at bedtime. 08/15/15  Yes [provider]  doxycycline (VIBRAMYCIN) 100 MG capsule Take  1 capsule (100 mg total) by mouth 2 (two) times daily. One po bid x 7 days Patient not taking: Reported on 04/12/2017 02/20/17   Ripley Fraise, MD  levothyroxine (SYNTHROID, LEVOTHROID) 75 MCG tablet Take 1 tablet (75 mcg total) by mouth daily before breakfast. Patient not taking: Reported on 02/20/2017 03/20/16   Verlee Monte, MD  ondansetron (ZOFRAN) 4 MG tablet Take 1 tablet (4 mg total) by mouth every 6 (six) hours. Patient not taking: Reported on 04/12/2017 01/20/17   Virgel Manifold, MD  predniSONE (DELTASONE) 50 MG tablet 1 tablet PO QD X4 days Patient not taking: Reported on 04/12/2017 02/20/17   Ripley Fraise, MD  predniSONE (STERAPRED UNI-PAK 21 TAB) 10 MG (21) TBPK tablet Take by mouth daily. Take 5 tabs for 1 day, then 4 tabs for 1 day, then 3 tabs for 1 day, 2 tabs for 1 day, then 1 tab by mouth daily for 1 day 04/12/17   Lorin Glass, PA-C  sulfamethoxazole-trimethoprim (BACTRIM DS,SEPTRA DS) 800-160 MG tablet Take 1  tablet by mouth 2 (two) times daily for 7 days. 04/12/17 04/19/17  Lorin Glass, PA-C  traMADol (ULTRAM) 50 MG tablet Take 1 tablet (50 mg total) by mouth every 6 (six) hours as needed. Patient not taking: Reported on 02/20/2017 01/20/17   Virgel Manifold, MD    Family History Family History  Problem Relation Age of Onset  . Cancer Maternal Grandmother        skin cancer    Social History Social History   Tobacco Use  . Smoking status: Former Smoker    Packs/day: 1.00    Years: 15.00    Pack years: 15.00    Last attempt to quit: 09/11/2010    Years since quitting: 6.5  . Smokeless tobacco: Never Used  Substance Use Topics  . Alcohol use: Yes    Comment: rare  . Drug use: No    Comment: occasional     Allergies   No known allergies   Review of Systems Review of Systems  Constitutional: Positive for chills and fatigue. Negative for fever.  HENT: Positive for congestion, ear pain, postnasal drip, rhinorrhea, sinus pressure, sinus pain  and sore throat. Negative for trouble swallowing.   Eyes: Positive for photophobia. Negative for pain and visual disturbance.  Respiratory: Positive for cough and shortness of breath. Negative for chest tightness and wheezing.   Cardiovascular: Negative for chest pain and palpitations.  Gastrointestinal: Positive for nausea. Negative for abdominal pain and vomiting.  Genitourinary: Negative for dysuria, frequency, hematuria and urgency.  Musculoskeletal: Positive for arthralgias, gait problem and myalgias. Negative for back pain.  Skin: Negative for color change and rash.  Neurological: Positive for headaches. Negative for seizures, syncope, weakness and numbness.  All other systems reviewed and are negative.    Physical Exam Updated Vital Signs BP 114/80   Pulse 88   Temp 98.4 F (36.9 C) (Oral)   Resp (!) 25   Ht 5\' 1"  (1.549 m)   Wt 44.5 kg (98 lb)   SpO2 99%   BMI 18.52 kg/m   Physical Exam  Constitutional: She appears well-developed and well-nourished.  Non-toxic appearance. No distress.  HENT:  Head: Normocephalic and atraumatic.  Mouth/Throat: Oropharynx is clear and moist.  Eyes: Conjunctivae and EOM are normal. Pupils are equal, round, and reactive to light. Right eye exhibits no discharge. Left eye exhibits no discharge. No scleral icterus.  Neck: Normal range of motion. Neck supple. No neck rigidity. No tracheal deviation present.  Cardiovascular: Normal rate, regular rhythm, normal heart sounds and intact distal pulses.  No murmur heard. 2+ radial, PT, DP pulses.  All extremities are warm and well perfused.  Pulmonary/Chest: Effort normal and breath sounds normal. No stridor. No respiratory distress.  Abdominal: Normal appearance and bowel sounds are normal. She exhibits no distension. There is no tenderness. There is no CVA tenderness.  Musculoskeletal: She exhibits no edema or deformity.  Patient has diffuse tenderness to palpation to bilateral lower extremities.   She flinches and attempts to withdraw to even light touch of bilateral lower extremities.  She also flinches and attempts to withdraw from palpation of upper back, and head.   Bilateral lower extremities have soft, easily compressible compartments, no obvious bruising, swelling, crepitus, or signs of trauma. 4/5 strength of bilateral lower extremities, limited by pain.  Patient has full range of motion to bilateral lower extremities, however active and passive movements of both lower extremities produce pain.  Spine palpated, no midline tenderness, step-offs, or deformities.  Neurological:  She is alert. She exhibits normal muscle tone. GCS eye subscore is 4. GCS verbal subscore is 5. GCS motor subscore is 6.  Sensation intact and symmetrical to bilateral lower extremities.    Skin: Skin is warm and dry. She is not diaphoretic.  Psychiatric: She has a normal mood and affect. Her behavior is normal.  Nursing note and vitals reviewed.    ED Treatments / Results  Labs (all labs ordered are listed, but only abnormal results are displayed) Labs Reviewed  COMPREHENSIVE METABOLIC PANEL - Abnormal; Notable for the following components:      Result Value   CO2 19 (*)    BUN <5 (*)    Total Protein 5.7 (*)    Albumin 2.8 (*)    AST 48 (*)    All other components within normal limits  CBC - Abnormal; Notable for the following components:   RBC 3.23 (*)    Hemoglobin 9.4 (*)    HCT 30.2 (*)    RDW 20.3 (*)    All other components within normal limits  URINALYSIS, ROUTINE W REFLEX MICROSCOPIC - Abnormal; Notable for the following components:   APPearance HAZY (*)    Nitrite POSITIVE (*)    Leukocytes, UA LARGE (*)    Bacteria, UA RARE (*)    Squamous Epithelial / LPF 0-5 (*)    All other components within normal limits    EKG  EKG Interpretation None       Radiology Dg Chest 2 View  Result Date: 04/12/2017 CLINICAL DATA:  41 year old female with cough for 1 week. EXAM: CHEST  2  VIEW COMPARISON:  02/20/2017 and prior chest radiographs FINDINGS: The cardiomediastinal silhouette is unremarkable. Calcified granulomas within both lungs again noted. There is no evidence of focal airspace disease, pulmonary edema, suspicious pulmonary nodule/mass, pleural effusion, or pneumothorax. No acute bony abnormalities are identified. A moderate to severe thoracic scoliosis again noted. IMPRESSION: No evidence of acute cardiopulmonary disease. Electronically Signed   By: Margarette Canada M.D.   On: 04/12/2017 21:35   Dg Knee Complete 4 Views Right  Result Date: 04/12/2017 CLINICAL DATA:  Acute right knee pain.  No known injury. EXAM: RIGHT KNEE - COMPLETE 4+ VIEW COMPARISON:  None. FINDINGS: No evidence of fracture, dislocation, or joint effusion. No evidence of arthropathy or other focal bone abnormality. Soft tissues are unremarkable. IMPRESSION: Negative. Electronically Signed   By: Margarette Canada M.D.   On: 04/12/2017 21:24    Procedures Procedures (including critical care time)  Medications Ordered in ED Medications  ondansetron (ZOFRAN-ODT) disintegrating tablet 4 mg (4 mg Oral Given 04/12/17 1939)  sodium chloride 0.9 % bolus 1,000 mL (0 mLs Intravenous Stopped 04/12/17 2231)  ketorolac (TORADOL) 30 MG/ML injection 15 mg (15 mg Intravenous Given 04/12/17 2150)  sulfamethoxazole-trimethoprim (BACTRIM DS,SEPTRA DS) 800-160 MG per tablet 1 tablet (1 tablet Oral Given 04/12/17 2230)  predniSONE (DELTASONE) tablet 60 mg (60 mg Oral Given 04/12/17 2231)     Initial Impression / Assessment and Plan / ED Course  I have reviewed the triage vital signs and the nursing notes.  Pertinent labs & imaging results that were available during my care of the patient were reviewed by me and considered in my medical decision making (see chart for details).    NAHIMA ALES presents today with multiple vague symptoms. 1. Pain of bilateral lower extremities: On physical exam unable to localize her pain  as she flinches even with very light touch to diffuse bilateral  lower extremities.  I do not suspect cauda equina syndrome, she does not have any associated sensory changes, no numbness/tingling reported to genitals.  She does not have any midline low back pain or changes to bowel/bladder function.  She reports that her pain initially started in her right knee, plain films were ordered which did not show any acute cause for her pain.  Both extremities are warm and well perfused.  I am not sure what is causing such diffuse reported pain, question neuropathy.  Safe for outpatient follow up.  She was given Toradol in the emergency room after she insisted that ibuprofen and Tylenol would not be strong enough.  DVT was considered, however there is no edema, her pain is not localized to any one area of her leg.  Low suspicion for DVT causing her diffuse pain.  2.  Cough and shortness of breath, headache: She has been having multiple days of cough.  She has a significant smoking history and reports that she has had increased cough, increased sputum production, and increased sputum purulence.  Chest x-ray was obtained which did not show any pneumonia, or pneumothorax.  She does have multiple lung nodules which appear unchanged from previous exam.  She was informed of both this and her  scoliosis.  I feel that this represents an acute exacerbation of chronic bronchitis.  She was treated with steroids.  And Bactrim was chosen as antibiotic due to dual coverage of UTI.  She denied chest pain or chest tightness.  PERC negative.  Otherwise her symptoms of reported nasal congestion, sore throat, and anterior forehead headache are all consistent with a URI with associated sinus pressure.  While she reports photophobia I did not see any evidence of this on exam and she was able to look at bright lights with out difficulty.  She was given toradol for her leg pain and headache, along with fluid bolus.    3. Cystitis: UA was  obtained, consistent with UTI with nitrite positive, large leukocytes, rare bacteria.  Patient is asymptomatic, however based on UA and generally not feeling well will treat for cystitis.  Bactrim was chosen to treat as this will also cover her acute exacerbation of chronic bronchitis.    At this time there does not appear to be any evidence of an acute emergency medical condition and the patient appears stable for discharge with appropriate outpatient follow up.Diagnosis was discussed with patient who verbalizes understanding and is agreeable to discharge. Pt case discussed with Dr. Roderic Palau  who agrees with my plan.    Final Clinical Impressions(s) / ED Diagnoses   Final diagnoses:  Nonintractable headache, unspecified chronicity pattern, unspecified headache type  Cystitis  Acute exacerbation of chronic bronchitis (HCC)  Upper respiratory tract infection, unspecified type  Tobacco consumption  Pain in both lower extremities    ED Discharge Orders        Ordered    predniSONE (STERAPRED UNI-PAK 21 TAB) 10 MG (21) TBPK tablet  Daily     04/12/17 2224    sulfamethoxazole-trimethoprim (BACTRIM DS,SEPTRA DS) 800-160 MG tablet  2 times daily     04/12/17 2224       Myeshia, Fojtik 04/13/17 2011    Milton Ferguson, MD 04/14/17 [phone]

## 2017-04-12 NOTE — ED Triage Notes (Signed)
Brought by ems from home for c/o right leg pain from knee up to thigh.  Patient reports "twisting" the leg a month ago but it hasn't gotten any better.  Also c/o headache for the last week.  Also c/o cough, sore throat, runny nose for a week as well.  Has difficulty putting weight on right leg.  Took ibuprofen 3 hours pta.

## 2017-04-12 NOTE — Discharge Instructions (Signed)
Please stop smoking!  Please take Ibuprofen (Advil, motrin) and Tylenol (acetaminophen) to relieve your pain.  You may take up to 600 MG (3 pills) of normal strength ibuprofen every 8 hours as needed.  In between doses of ibuprofen you make take tylenol, up to 1,000 mg (two extra strength pills).  Do not take more than 3,000 mg tylenol in a 24 hour period.  Please check all medication labels as many medications such as pain and cold medications may contain tylenol.  Do not drink alcohol while taking these medications.  Do not take other NSAID'S while taking ibuprofen (such as aleve or naproxen).  Please take ibuprofen with food to decrease stomach upset.  You may have diarrhea from the antibiotics.  It is very important that you continue to take the antibiotics even if you get diarrhea unless a medical professional tells you that you may stop taking them.  If you stop too early the bacteria you are being treated for will become stronger and you may need different, more powerful antibiotics that have more side effects and worsening diarrhea.  Please stay well hydrated and consider probiotics as they may decrease the severity of your diarrhea.  Please be aware that if you take any hormonal contraception (birth control pills, nexplanon, the ring, etc) that your birth control will not work while you are taking antibiotics and you need to use back up protection as directed on the birth control medication information insert.   Please use your previously prescribed albuterol by inhaler as needed.  Please follow-up with your primary care doctor regarding your ED visit, leg pains, and symptoms today.

## 2017-04-12 NOTE — ED Notes (Signed)
Patient transported to X-ray 

## 2017-06-01 ENCOUNTER — Encounter (HOSPITAL_COMMUNITY): Payer: Self-pay | Admitting: *Deleted

## 2017-06-01 ENCOUNTER — Emergency Department (HOSPITAL_COMMUNITY): Payer: Medicaid Other

## 2017-06-01 ENCOUNTER — Other Ambulatory Visit: Payer: Self-pay

## 2017-06-01 ENCOUNTER — Inpatient Hospital Stay (HOSPITAL_COMMUNITY)
Admission: EM | Admit: 2017-06-01 | Discharge: 2017-06-05 | DRG: 100 | Disposition: A | Discharge status: Home / Self Care | Payer: Medicaid Other | Attending: Internal Medicine | Admitting: Internal Medicine

## 2017-06-01 DIAGNOSIS — E039 Hypothyroidism, unspecified: Secondary | ICD-10-CM | POA: Diagnosis not present

## 2017-06-01 DIAGNOSIS — R64 Cachexia: Secondary | ICD-10-CM | POA: Diagnosis present

## 2017-06-01 DIAGNOSIS — T381X6A Underdosing of thyroid hormones and substitutes, initial encounter: Secondary | ICD-10-CM | POA: Diagnosis present

## 2017-06-01 DIAGNOSIS — Y900 Blood alcohol level of less than 20 mg/100 ml: Secondary | ICD-10-CM | POA: Diagnosis present

## 2017-06-01 DIAGNOSIS — R600 Localized edema: Secondary | ICD-10-CM | POA: Diagnosis present

## 2017-06-01 DIAGNOSIS — R51 Headache: Secondary | ICD-10-CM | POA: Diagnosis present

## 2017-06-01 DIAGNOSIS — J209 Acute bronchitis, unspecified: Secondary | ICD-10-CM | POA: Diagnosis present

## 2017-06-01 DIAGNOSIS — D638 Anemia in other chronic diseases classified elsewhere: Secondary | ICD-10-CM | POA: Diagnosis not present

## 2017-06-01 DIAGNOSIS — R1110 Vomiting, unspecified: Secondary | ICD-10-CM | POA: Diagnosis present

## 2017-06-01 DIAGNOSIS — D649 Anemia, unspecified: Secondary | ICD-10-CM | POA: Diagnosis not present

## 2017-06-01 DIAGNOSIS — Y92009 Unspecified place in unspecified non-institutional (private) residence as the place of occurrence of the external cause: Secondary | ICD-10-CM

## 2017-06-01 DIAGNOSIS — W19XXXA Unspecified fall, initial encounter: Secondary | ICD-10-CM | POA: Diagnosis present

## 2017-06-01 DIAGNOSIS — E876 Hypokalemia: Secondary | ICD-10-CM

## 2017-06-01 DIAGNOSIS — E8809 Other disorders of plasma-protein metabolism, not elsewhere classified: Secondary | ICD-10-CM | POA: Diagnosis present

## 2017-06-01 DIAGNOSIS — F1010 Alcohol abuse, uncomplicated: Secondary | ICD-10-CM | POA: Diagnosis not present

## 2017-06-01 DIAGNOSIS — Z79899 Other long term (current) drug therapy: Secondary | ICD-10-CM

## 2017-06-01 DIAGNOSIS — J40 Bronchitis, not specified as acute or chronic: Secondary | ICD-10-CM | POA: Diagnosis not present

## 2017-06-01 DIAGNOSIS — Z681 Body mass index (BMI) 19 or less, adult: Secondary | ICD-10-CM

## 2017-06-01 DIAGNOSIS — Z3201 Encounter for pregnancy test, result positive: Secondary | ICD-10-CM

## 2017-06-01 DIAGNOSIS — F17200 Nicotine dependence, unspecified, uncomplicated: Secondary | ICD-10-CM | POA: Diagnosis present

## 2017-06-01 DIAGNOSIS — F10231 Alcohol dependence with withdrawal delirium: Secondary | ICD-10-CM | POA: Diagnosis present

## 2017-06-01 DIAGNOSIS — R059 Cough, unspecified: Secondary | ICD-10-CM

## 2017-06-01 DIAGNOSIS — M7989 Other specified soft tissue disorders: Secondary | ICD-10-CM | POA: Diagnosis not present

## 2017-06-01 DIAGNOSIS — I10 Essential (primary) hypertension: Secondary | ICD-10-CM | POA: Diagnosis present

## 2017-06-01 DIAGNOSIS — Z85819 Personal history of malignant neoplasm of unspecified site of lip, oral cavity, and pharynx: Secondary | ICD-10-CM

## 2017-06-01 DIAGNOSIS — R569 Unspecified convulsions: Principal | ICD-10-CM | POA: Diagnosis present

## 2017-06-01 DIAGNOSIS — Z8521 Personal history of malignant neoplasm of larynx: Secondary | ICD-10-CM

## 2017-06-01 DIAGNOSIS — R740 Nonspecific elevation of levels of transaminase and lactic acid dehydrogenase [LDH]: Secondary | ICD-10-CM | POA: Diagnosis present

## 2017-06-01 DIAGNOSIS — R05 Cough: Secondary | ICD-10-CM

## 2017-06-01 DIAGNOSIS — E43 Unspecified severe protein-calorie malnutrition: Secondary | ICD-10-CM | POA: Diagnosis present

## 2017-06-01 DIAGNOSIS — R000 Tachycardia, unspecified: Secondary | ICD-10-CM | POA: Diagnosis present

## 2017-06-01 LAB — URINALYSIS, ROUTINE W REFLEX MICROSCOPIC
Bilirubin Urine: NEGATIVE
GLUCOSE, UA: NEGATIVE mg/dL
HGB URINE DIPSTICK: NEGATIVE
Ketones, ur: NEGATIVE mg/dL
Leukocytes, UA: NEGATIVE
Nitrite: NEGATIVE
Protein, ur: NEGATIVE mg/dL
SPECIFIC GRAVITY, URINE: 1.01 (ref 1.005–1.030)
pH: 6 (ref 5.0–8.0)

## 2017-06-01 LAB — CBC WITH DIFFERENTIAL/PLATELET
Basophils Absolute: 0 10*3/uL (ref 0.0–0.1)
Basophils Relative: 0 %
Eosinophils Absolute: 0 10*3/uL (ref 0.0–0.7)
Eosinophils Relative: 1 %
HEMATOCRIT: 30.1 % — AB (ref 36.0–46.0)
HEMOGLOBIN: 9.7 g/dL — AB (ref 12.0–15.0)
LYMPHS PCT: 5 %
Lymphs Abs: 0.3 10*3/uL — ABNORMAL LOW (ref 0.7–4.0)
MCH: 28.1 pg (ref 26.0–34.0)
MCHC: 32.2 g/dL (ref 30.0–36.0)
MCV: 87.2 fL (ref 78.0–100.0)
MONOS PCT: 9 %
Monocytes Absolute: 0.5 10*3/uL (ref 0.1–1.0)
NEUTROS ABS: 5 10*3/uL (ref 1.7–7.7)
NEUTROS PCT: 85 %
Platelets: 327 10*3/uL (ref 150–400)
RBC: 3.45 MIL/uL — ABNORMAL LOW (ref 3.87–5.11)
RDW: 17.5 % — ABNORMAL HIGH (ref 11.5–15.5)
WBC: 5.8 10*3/uL (ref 4.0–10.5)

## 2017-06-01 LAB — COMPREHENSIVE METABOLIC PANEL
ALBUMIN: 2.7 g/dL — AB (ref 3.5–5.0)
ALK PHOS: 132 U/L — AB (ref 38–126)
ALT: 25 U/L (ref 14–54)
ANION GAP: 11 (ref 5–15)
AST: 35 U/L (ref 15–41)
BUN: 6 mg/dL (ref 6–20)
CALCIUM: 8.8 mg/dL — AB (ref 8.9–10.3)
CO2: 28 mmol/L (ref 22–32)
Chloride: 92 mmol/L — ABNORMAL LOW (ref 101–111)
Creatinine, Ser: 0.64 mg/dL (ref 0.44–1.00)
GFR calc Af Amer: >60 mL/min (ref >60)
GFR calc non Af Amer: >60 mL/min (ref >60)
Glucose, Bld: 109 mg/dL — ABNORMAL HIGH (ref 65–99)
SODIUM: 131 mmol/L — AB (ref 135–145)
Total Bilirubin: 1.1 mg/dL (ref 0.3–1.2)
Total Protein: 5.9 g/dL — ABNORMAL LOW (ref 6.5–8.1)

## 2017-06-01 LAB — CBG MONITORING, ED: GLUCOSE-CAPILLARY: 120 mg/dL — AB (ref 65–99)

## 2017-06-01 LAB — LIPASE, BLOOD: Lipase: 18 U/L (ref 11–51)

## 2017-06-01 LAB — RAPID URINE DRUG SCREEN, HOSP PERFORMED
AMPHETAMINES: NOT DETECTED
BENZODIAZEPINES: NOT DETECTED
Barbiturates: NOT DETECTED
Cocaine: NOT DETECTED
Opiates: POSITIVE — AB
TETRAHYDROCANNABINOL: NOT DETECTED

## 2017-06-01 LAB — MAGNESIUM: MAGNESIUM: 1.3 mg/dL — AB (ref 1.7–2.4)

## 2017-06-01 LAB — I-STAT BETA HCG BLOOD, ED (MC, WL, AP ONLY): I-stat hCG, quantitative: 13.9 m[IU]/mL — ABNORMAL HIGH (ref <5)

## 2017-06-01 LAB — ETHANOL: Alcohol, Ethyl (B): <10 mg/dL (ref <10)

## 2017-06-01 MED ORDER — POTASSIUM CHLORIDE CRYS ER 20 MEQ PO TBCR: 20.0000 meq | EXTENDED_RELEASE_TABLET | Freq: Two times a day (BID) | ORAL | Status: DC

## 2017-06-01 MED ORDER — POTASSIUM CHLORIDE CRYS ER 20 MEQ PO TBCR
40.0000 meq | EXTENDED_RELEASE_TABLET | Freq: Once | ORAL | Status: AC
  Administered 2017-06-01: 40 meq via ORAL
  Filled 2017-06-01: qty 2

## 2017-06-01 MED ORDER — LORAZEPAM 2 MG/ML IJ SOLN: 1.0000 mg | Freq: Four times a day (QID) | INTRAMUSCULAR | Status: AC | PRN

## 2017-06-01 MED ORDER — SODIUM CHLORIDE 0.9 % IV SOLN
INTRAVENOUS | Status: DC
  Administered 2017-06-01 – 2017-06-02 (×3): via INTRAVENOUS

## 2017-06-01 MED ORDER — LORAZEPAM 1 MG PO TABS: 1.0000 mg | ORAL_TABLET | Freq: Four times a day (QID) | ORAL | Status: AC | PRN

## 2017-06-01 MED ORDER — DIAZEPAM 5 MG PO TABS
5.0000 mg | ORAL_TABLET | Freq: Three times a day (TID) | ORAL | Status: DC
  Administered 2017-06-01: 5 mg via ORAL
  Filled 2017-06-01: qty 1

## 2017-06-01 MED ORDER — ENOXAPARIN SODIUM 40 MG/0.4ML ~~LOC~~ SOLN
40.0000 mg | SUBCUTANEOUS | Status: DC
  Administered 2017-06-01: 40 mg via SUBCUTANEOUS
  Filled 2017-06-01 (×2): qty 0.4

## 2017-06-01 MED ORDER — LEVOTHYROXINE SODIUM 50 MCG PO TABS
50.0000 ug | ORAL_TABLET | Freq: Every day | ORAL | Status: DC
  Administered 2017-06-02 – 2017-06-05 (×4): 50 ug via ORAL
  Filled 2017-06-01 (×5): qty 1

## 2017-06-01 MED ORDER — ACETAMINOPHEN 325 MG PO TABS
650.0000 mg | ORAL_TABLET | Freq: Four times a day (QID) | ORAL | Status: DC | PRN
  Administered 2017-06-01 – 2017-06-05 (×5): 650 mg via ORAL
  Filled 2017-06-01 (×5): qty 2

## 2017-06-01 MED ORDER — ONDANSETRON HCL 4 MG PO TABS: 4.0000 mg | ORAL_TABLET | Freq: Four times a day (QID) | ORAL | Status: DC | PRN

## 2017-06-01 MED ORDER — ACETAMINOPHEN 650 MG RE SUPP: 650.0000 mg | Freq: Four times a day (QID) | RECTAL | Status: DC | PRN

## 2017-06-01 MED ORDER — ONDANSETRON HCL 4 MG/2ML IJ SOLN: 4.0000 mg | Freq: Four times a day (QID) | INTRAMUSCULAR | Status: DC | PRN

## 2017-06-01 MED ORDER — LORAZEPAM 2 MG/ML IJ SOLN: 1.0000 mg | INTRAMUSCULAR | Status: DC | PRN

## 2017-06-01 MED ORDER — VITAMIN B-1 100 MG PO TABS
100.0000 mg | ORAL_TABLET | Freq: Every day | ORAL | Status: DC
  Administered 2017-06-01 – 2017-06-05 (×5): 100 mg via ORAL
  Filled 2017-06-01 (×5): qty 1

## 2017-06-01 MED ORDER — FOLIC ACID 1 MG PO TABS
1.0000 mg | ORAL_TABLET | Freq: Every day | ORAL | Status: DC
  Administered 2017-06-01 – 2017-06-05 (×5): 1 mg via ORAL
  Filled 2017-06-01 (×5): qty 1

## 2017-06-01 MED ORDER — ADULT MULTIVITAMIN W/MINERALS CH
1.0000 | ORAL_TABLET | Freq: Every day | ORAL | Status: DC
  Administered 2017-06-01 – 2017-06-05 (×5): 1 via ORAL
  Filled 2017-06-01 (×5): qty 1

## 2017-06-01 MED ORDER — IPRATROPIUM-ALBUTEROL 0.5-2.5 (3) MG/3ML IN SOLN: 3.0000 mL | Freq: Four times a day (QID) | RESPIRATORY_TRACT | Status: DC | PRN

## 2017-06-01 MED ORDER — THIAMINE HCL 100 MG/ML IJ SOLN: 100.0000 mg | Freq: Every day | INTRAMUSCULAR | Status: DC

## 2017-06-01 MED ORDER — ENSURE ENLIVE PO LIQD: 237.0000 mL | Freq: Two times a day (BID) | ORAL | Status: DC

## 2017-06-01 MED ORDER — SODIUM CHLORIDE 0.9 % IV BOLUS (SEPSIS)
500.0000 mL | Freq: Once | INTRAVENOUS | Status: AC
Start: 2017-06-01 — End: 2017-06-01
  Administered 2017-06-01: 500 mL via INTRAVENOUS

## 2017-06-01 MED ORDER — NICOTINE 21 MG/24HR TD PT24
21.0000 mg | MEDICATED_PATCH | Freq: Every day | TRANSDERMAL | Status: DC
  Administered 2017-06-01 – 2017-06-05 (×5): 21 mg via TRANSDERMAL
  Filled 2017-06-01 (×5): qty 1

## 2017-06-01 MED ORDER — POTASSIUM CHLORIDE 10 MEQ/100ML IV SOLN
10.0000 meq | INTRAVENOUS | Status: AC
  Administered 2017-06-01 (×5): 10 meq via INTRAVENOUS
  Filled 2017-06-01 (×7): qty 100

## 2017-06-01 NOTE — ED Provider Notes (Signed)
[place] DEPT Provider Note   CSN: [id number] Arrival date & time: 06/01/17  1322     History   Chief Complaint Chief Complaint  Patient presents with  . Seizures    HPI Crystal Spence is a 42 y.o. female.  Patient brought in by EMS.  Family states patient was sitting in rocking chair and started to shake all over.  Eyes were shaking.  Then her eyes closed.  EMS felt as if patient was postictal upon their arrival.  Blood sugar was 139.  Vital signs without significant abnormalities other than that she was slightly tachycardic at 104.  Room air sats were 100%.  Patient's past medical history is significant for a history of laryngeal cancer.  Patient's also had pneumonia in the past.  Also had a lung abscess in the past.  Patient has not followed up recently with hematology oncology.  But is deemed to be a cancer survivor.  Patient does not have a history of seizures.  Upon arrival patient still a little postictal.  Seems somewhat confused.  Did have a raspy cough.  Family members state that she has had an upper respiratory infection for the past few days.  Apparently went to the doctor for this and apparently was treated with an antibiotic.  They are not sure which one.      Past Medical History:  Diagnosis Date  . Anemia, unspecified 06/17/2013  . Headache   . History of laryngeal cancer 02/03/2013  . Hypertension   . Hypothyroidism (acquired) 06/17/2013  . Multiple lung nodules 06/28/2014  . Pneumonia 01/14/2014, 04/2017  . Poor oral hygiene 06/17/2013  . Rib pain 01/14/2014  . Rib pain on left side 01/05/2014  . Throat cancer [facility])     Patient Active Problem List   Diagnosis Date Noted  . Medication monitoring encounter 04/29/2016  . Protein-calorie malnutrition, severe 03/19/2016  . Chronic anemia   . Thrombocytosis ([place])   . Abscess of lower lobe of right lung with pneumonia ([place])   . Hypoxemia   . Poor dentition   . Pulmonary abscess ([place])  03/17/2016  . Dehydration 03/17/2016  . Abnormal LFTs 12/14/2015  . Iron deficiency anemia 12/04/2015  . Multiple lung nodules 06/28/2014  . Other type of nonintractable migraine 06/28/2014  . Community acquired pneumonia of right lower lobe of lung ([place]) 01/14/2014  . Rib pain 01/14/2014  . Rib pain on left side 01/05/2014  . Unexplained weight loss 01/05/2014  . Anemia in chronic illness 06/17/2013  . Hypothyroidism (acquired) 06/17/2013  . Poor oral hygiene 06/17/2013  . Acute blood loss anemia 02/03/2013  . Syncope 02/03/2013  . Hyponatremia 02/03/2013  . History of laryngeal cancer 02/03/2013  . Hypokalemia 02/02/2013  . 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 Upper GI bleed 02/02/2013    Past Surgical History:  Procedure Laterality Date  . ABDOMINAL SURGERY    . COLONOSCOPY WITH PROPOFOL N/A 10/12/2015   Procedure: COLONOSCOPY WITH PROPOFOL;  Surgeon: Wonda Horner, MD;  Location: [facility] ENDOSCOPY;  Service: Endoscopy;  Laterality: N/A;  . ESOPHAGOGASTRODUODENOSCOPY N/A 02/03/2013   Procedure: ESOPHAGOGASTRODUODENOSCOPY (EGD);  Surgeon: Juanita Craver, MD;  Location: [facility] ENDOSCOPY;  Service: Endoscopy;  Laterality: N/A;  . ESOPHAGOGASTRODUODENOSCOPY N/A 10/12/2015   Procedure: ESOPHAGOGASTRODUODENOSCOPY (EGD);  Surgeon: Wonda Horner, MD;  Location: [facility];  Service: Endoscopy;  Laterality: N/A;  . 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 TRACHEOSTOMY     for throat inflammation from radiation  . TUBAL LIGATION      OB History    No data available       Home Medications    Prior to Admission medications   Medication Sig Start Date End Date Taking? Authorizing Provider  albuterol (PROVENTIL HFA;VENTOLIN HFA) 108 (90 Base) MCG/ACT inhaler Inhale 2 puffs into the lungs every 2 (two) hours as needed for wheezing or shortness of breath (cough). 02/20/17   Yes Ripley Fraise, MD  butalbital-acetaminophen-caffeine (FIORICET, ESGIC) [phone] MG tablet Take 1 tablet by mouth 3 (three) times daily as needed for headache or migraine.  07/28/15   [provider]  CARTIA XT 240 MG 24 hr capsule Take 240 mg by mouth daily. 09/06/15   [provider]  clonazePAM (KLONOPIN) 0.5 MG tablet Take 0.5 mg by mouth daily as needed for anxiety. 12/15/15   [provider]  levothyroxine (SYNTHROID, LEVOTHROID) 50 MCG tablet Take 50 mcg by mouth daily before breakfast.    [provider]  losartan (COZAAR) 25 MG tablet Take 25 mg by mouth daily. 06/28/15   [provider]  montelukast (SINGULAIR) 10 MG tablet TAKE 1 TABLET BY MOUTH AT BEDTIME 11/12/15   [provider]  potassium chloride SA (K-DUR,KLOR-CON) 20 MEQ tablet Take 20 mEq by mouth 2 (two) times daily.    [provider]  topiramate (TOPAMAX) 50 MG tablet Take 100 mg by mouth 2 (two) times daily.     [provider]  traZODone (DESYREL) 100 MG tablet Take 100 mg by mouth at bedtime. 08/15/15   [provider]    Family History Family History  Problem Relation Age of Onset  . Cancer Maternal Grandmother        skin cancer    Social History Social History   Tobacco Use  . Smoking status: Former Smoker    Packs/day: 1.00    Years: 15.00    Pack years: 15.00    Last attempt to quit: 09/11/2010    Years since quitting: 6.7  . Smokeless tobacco: Never Used  Substance Use Topics  . Alcohol use: Yes    Comment: rare  . Drug use: No    Comment: occasional     Allergies   No known allergies   Review of Systems Review of Systems  Unable to perform ROS: Mental status change  Neurological: Negative for seizures.     Physical Exam Updated Vital Signs BP (!) 144/101 (BP Location: Right Arm)   Pulse (!) 101   Temp 98.1 F (36.7 C) (Oral)   Resp (!) 28   SpO2 100%   Physical Exam  Constitutional: She appears  well-developed and well-nourished. No distress.  HENT:  Head: Normocephalic and atraumatic.  Mouth/Throat: Oropharynx is clear and moist.  Eyes: Conjunctivae are normal. Pupils are equal, round, and reactive to light.  Neck: Normal range of motion. Neck supple.  Cardiovascular: Normal rate, regular rhythm and normal heart sounds.  Pulmonary/Chest: Effort normal and breath sounds normal. She has no wheezes.  Abdominal: Soft. Bowel sounds are normal. There is no tenderness.  Musculoskeletal: Normal range of motion.  Neurological: She is alert. No cranial nerve deficit. She exhibits normal muscle tone. Coordination normal.  Patient awake will move all extremities to command.  But appears dazed.  Skin: Skin is warm.  Nursing note and vitals reviewed.    ED Treatments / Results  Labs (  all labs ordered are listed, but only abnormal results are displayed) Labs Reviewed  CBG MONITORING, ED - Abnormal; Notable for the following components:      Result Value   Glucose-Capillary 120 (*)    All other components within normal limits  COMPREHENSIVE METABOLIC PANEL  LIPASE, BLOOD  CBC WITH DIFFERENTIAL/PLATELET  ETHANOL  URINALYSIS, ROUTINE W REFLEX MICROSCOPIC  RAPID URINE DRUG SCREEN, HOSP PERFORMED  I-STAT BETA HCG BLOOD, ED ([facility], [facility], [facility] ONLY)  I-STAT BETA HCG BLOOD, ED ([facility], [facility], [facility] ONLY)    EKG  EKG Interpretation  Date/Time:  Sunday June 01 2017 13:41:56 EST Ventricular Rate:  98 PR Interval:    QRS Duration: 81 QT Interval:  366 QTC Calculation: 468 R Axis:   54 Text Interpretation:  Sinus rhythm Anteroseptal infarct, age indeterminate Confirmed by Fredia Sorrow [phone]) on 06/01/2017 2:34:28 PM Also confirmed by Fredia Sorrow [phone]), editor Philomena Doheny [phone])  on 06/01/2017 2:47:49 PM       Radiology Dg Chest 2 View  Result Date: 06/01/2017 CLINICAL DATA:  pt was talking to a relative when she paused , drew arms inward and began to shake HTN, h/o multiple lung  nodules (06/17/2013), h/o PNA, h/o throat cancer, former smoker x 6 years ago EXAM: CHEST  2 VIEW COMPARISON:  04/12/2017 FINDINGS: Normal cardiac silhouette. Scoliosis. Calcified granulomas scattered through both lungs unchanged. No effusion, infiltrate pneumothorax. IMPRESSION: 1.  No acute cardiopulmonary process. 2. Sigmoid scoliosis. Electronically Signed   By: Suzy Bouchard M.D.   On: 06/01/2017 14:44   Ct Head Wo Contrast  Result Date: 06/01/2017 CLINICAL DATA:  Seizure. EXAM: CT HEAD WITHOUT CONTRAST TECHNIQUE: Contiguous axial images were obtained from the base of the skull through the vertex without intravenous contrast. COMPARISON:  None. FINDINGS: Brain: No evidence of acute infarction, hemorrhage, hydrocephalus, extra-axial collection or mass lesion/mass effect. Advanced for age brain parenchymal atrophy. Mild periventricular microangiopathy. Vascular: No hyperdense vessel or unexpected calcification. Skull: Normal. Negative for fracture or focal lesion. Sinuses/Orbits: Polypoid mucosal thickening of bilateral maxillary sinuses, bilateral ethmoid sinuses and less so frontal sinuses. Gas fluid level within the left sphenoid sinus. Osseous remodeling of the maxillary sinus walls, suggestive of chronic sinusitis. Other: None. IMPRESSION: No acute intracranial abnormality. Mild but advanced for age brain parenchymal atrophy and chronic microvascular disease. Pansinusitis with evidence of chronic sinusitis of the maxillary sinuses. Electronically Signed   By: Fidela Salisbury M.D.   On: 06/01/2017 15:13    Procedures Procedures (including critical care time)  Medications Ordered in ED Medications  0.9 %  sodium chloride infusion (not administered)  sodium chloride 0.9 % bolus 500 mL (500 mLs Intravenous New Bag/Given 06/01/17 1526)     Initial Impression / Assessment and Plan / ED Course  I have reviewed the triage vital signs and the nursing notes.  Pertinent labs & imaging results  that were available during my care of the patient were reviewed by me and considered in my medical decision making (see chart for details).     Patient seems to have had a new onset seizure.  Head CT without acute findings other than atrophy chest x-ray negative for pneumonia.  Labs are still pending.  Patient without fever here.  The patient appears somewhat confused.  If patient's labs do not show indication for admission would discuss with neuro hospitalist most likely can just follow up with neurology since this is a first time seizure.  Still currently a little postictal sort of her mental  status does not return to baseline she may require admission.  But she is fairly alert and will follow commands currently.  Final Clinical Impressions(s) / ED Diagnoses   Final diagnoses:  Seizure [facility])  Bronchitis    ED Discharge Orders    None       Fredia Sorrow, MD 06/01/17 1554

## 2017-06-01 NOTE — ED Notes (Signed)
ED TO INPATIENT HANDOFF REPORT  Name/Age/Gender Lorain Childes 42 y.o. female  Code Status    Code Status Orders  (From admission, onward)        Start     Ordered   06/01/17 1932  Full code  Continuous     06/01/17 1935    Code Status History    Date Active Date Inactive Code Status Order ID Comments User Context   03/17/2016 22:19 03/20/2016 15:52 Full Code [id number]  Lily Kocher, MD Inpatient   02/02/2013 13:41 02/05/2013 20:24 Full Code [id number]  Nita Sells, MD Inpatient      Home/SNF/Other Home  Chief Complaint seizure  Level of Care/Admitting Diagnosis ED Disposition    ED Disposition Condition [place]: [facility] [[id number]]  Level of Care: Telemetry [5]  Admit to tele based on following criteria: Other see comments  Comments: sz  Diagnosis: Observed seizure-like activity [facility]) [169450]  Admitting Physician: Norval Morton [[id number]]  Attending Physician: Norval Morton [[id number]]  PT Class (Do Not Modify): Observation [104]  PT Acc Code (Do Not Modify): Observation [10022]       Medical History Past Medical History:  Diagnosis Date  . Anemia, unspecified 06/17/2013  . Headache   . History of laryngeal cancer 02/03/2013  . Hypertension   . Hypothyroidism (acquired) 06/17/2013  . Multiple lung nodules 06/28/2014  . Pneumonia 01/14/2014, 04/2017  . Poor oral hygiene 06/17/2013  . Rib pain 01/14/2014  . Rib pain on left side 01/05/2014  . Throat cancer ([facility])     Allergies Allergies  Allergen Reactions  . No Known Allergies     IV Location/Drains/Wounds Patient Lines/Drains/Airways Status   Active Line/Drains/Airways    Name:   Placement date:   Placement time:   Site:   Days:   Peripheral IV 06/01/17 Right Forearm   06/01/17    1544    Forearm   less than 1   Incision (Closed) 07/31/16 Neck Other (Comment)   07/31/16    1206     305   Wound / Incision (Open or Dehisced) 03/18/16 Incision - Open  Throat Mid   03/18/16    0012    Throat   440          Labs/Imaging Results for orders placed or performed during the hospital encounter of 06/01/17 (from the past 48 hour(s))  CBG monitoring, ED     Status: Abnormal   Collection Time: 06/01/17  1:45 PM  Result Value Ref Range   Glucose-Capillary 120 (H) 65 - 99 mg/dL  Comprehensive metabolic panel     Status: Abnormal   Collection Time: 06/01/17  2:28 PM  Result Value Ref Range   Sodium 131 (L) 135 - 145 mmol/L   Potassium <2.0 (LL) 3.5 - 5.1 mmol/L    Comment: CRITICAL RESULT CALLED TO, READ BACK BY AND VERIFIED WITH: K.HANKS,RN 06/01/17 '@1715'  BY V.WILKINS    Chloride 92 (L) 101 - 111 mmol/L   CO2 28 22 - 32 mmol/L   Glucose, Bld 109 (H) 65 - 99 mg/dL   BUN 6 6 - 20 mg/dL   Creatinine, Ser 0.64 0.44 - 1.00 mg/dL   Calcium 8.8 (L) 8.9 - 10.3 mg/dL   Total Protein 5.9 (L) 6.5 - 8.1 g/dL   Albumin 2.7 (L) 3.5 - 5.0 g/dL   AST 35 15 - 41 U/L   ALT 25 14 - 54 U/L   Alkaline Phosphatase 132 (  H) 38 - 126 U/L   Total Bilirubin 1.1 0.3 - 1.2 mg/dL   GFR calc non Af Amer >60 >60 mL/min   GFR calc Af Amer >60 >60 mL/min    Comment: (NOTE) The eGFR has been calculated using the CKD EPI equation. This calculation has not been validated in all clinical situations. eGFR's persistently <60 mL/min signify possible Chronic Kidney Disease.    Anion gap 11 5 - 15  Lipase, blood     Status: None   Collection Time: 06/01/17  2:28 PM  Result Value Ref Range   Lipase 18 11 - 51 U/L  CBC with Differential/Platelet     Status: Abnormal   Collection Time: 06/01/17  2:28 PM  Result Value Ref Range   WBC 5.8 4.0 - 10.5 K/uL   RBC 3.45 (L) 3.87 - 5.11 MIL/uL   Hemoglobin 9.7 (L) 12.0 - 15.0 g/dL   HCT 30.1 (L) 36.0 - 46.0 %   MCV 87.2 78.0 - 100.0 fL   MCH 28.1 26.0 - 34.0 pg   MCHC 32.2 30.0 - 36.0 g/dL   RDW 17.5 (H) 11.5 - 15.5 %   Platelets 327 150 - 400 K/uL   Neutrophils Relative % 85 %   Neutro Abs 5.0 1.7 - 7.7 K/uL    Lymphocytes Relative 5 %   Lymphs Abs 0.3 (L) 0.7 - 4.0 K/uL   Monocytes Relative 9 %   Monocytes Absolute 0.5 0.1 - 1.0 K/uL   Eosinophils Relative 1 %   Eosinophils Absolute 0.0 0.0 - 0.7 K/uL   Basophils Relative 0 %   Basophils Absolute 0.0 0.0 - 0.1 K/uL  Ethanol     Status: None   Collection Time: 06/01/17  2:28 PM  Result Value Ref Range   Alcohol, Ethyl (B) <10 <10 mg/dL    Comment:        LOWEST DETECTABLE LIMIT FOR SERUM ALCOHOL IS 10 mg/dL FOR MEDICAL PURPOSES ONLY   Urinalysis, Routine w reflex microscopic     Status: None   Collection Time: 06/01/17  2:28 PM  Result Value Ref Range   Color, Urine YELLOW YELLOW   APPearance CLEAR CLEAR   Specific Gravity, Urine 1.010 1.005 - 1.030   pH 6.0 5.0 - 8.0   Glucose, UA NEGATIVE NEGATIVE mg/dL   Hgb urine dipstick NEGATIVE NEGATIVE   Bilirubin Urine NEGATIVE NEGATIVE   Ketones, ur NEGATIVE NEGATIVE mg/dL   Protein, ur NEGATIVE NEGATIVE mg/dL   Nitrite NEGATIVE NEGATIVE   Leukocytes, UA NEGATIVE NEGATIVE  Rapid urine drug screen (hospital performed)     Status: Abnormal   Collection Time: 06/01/17  2:28 PM  Result Value Ref Range   Opiates POSITIVE (A) NONE DETECTED   Cocaine NONE DETECTED NONE DETECTED   Benzodiazepines NONE DETECTED NONE DETECTED   Amphetamines NONE DETECTED NONE DETECTED   Tetrahydrocannabinol NONE DETECTED NONE DETECTED   Barbiturates NONE DETECTED NONE DETECTED    Comment: (NOTE) DRUG SCREEN FOR MEDICAL PURPOSES ONLY.  IF CONFIRMATION IS NEEDED FOR ANY PURPOSE, NOTIFY LAB WITHIN 5 DAYS. LOWEST DETECTABLE LIMITS FOR URINE DRUG SCREEN Drug Class                     Cutoff (ng/mL) Amphetamine and metabolites    1000 Barbiturate and metabolites    200 Benzodiazepine                 858 Tricyclics and metabolites     300 Opiates and metabolites  300 Cocaine and metabolites        300 THC                            50   I-Stat beta hCG blood, ED     Status: Abnormal   Collection Time:  06/01/17  4:26 PM  Result Value Ref Range   I-stat hCG, quantitative 13.9 (H) <5 mIU/mL   Comment 3            Comment:   GEST. AGE      CONC.  (mIU/mL)   <=1 WEEK        5 - 50     2 WEEKS       50 - 500     3 WEEKS       100 - 10,000     4 WEEKS     1,000 - 30,000        FEMALE AND NON-PREGNANT FEMALE:     LESS THAN 5 mIU/mL    Dg Chest 2 View  Result Date: 06/01/2017 CLINICAL DATA:  pt was talking to a relative when she paused , drew arms inward and began to shake HTN, h/o multiple lung nodules (06/17/2013), h/o PNA, h/o throat cancer, former smoker x 6 years ago EXAM: CHEST  2 VIEW COMPARISON:  04/12/2017 FINDINGS: Normal cardiac silhouette. Scoliosis. Calcified granulomas scattered through both lungs unchanged. No effusion, infiltrate pneumothorax. IMPRESSION: 1.  No acute cardiopulmonary process. 2. Sigmoid scoliosis. Electronically Signed   By: Suzy Bouchard M.D.   On: 06/01/2017 14:44   Ct Head Wo Contrast  Result Date: 06/01/2017 CLINICAL DATA:  Seizure. EXAM: CT HEAD WITHOUT CONTRAST TECHNIQUE: Contiguous axial images were obtained from the base of the skull through the vertex without intravenous contrast. COMPARISON:  None. FINDINGS: Brain: No evidence of acute infarction, hemorrhage, hydrocephalus, extra-axial collection or mass lesion/mass effect. Advanced for age brain parenchymal atrophy. Mild periventricular microangiopathy. Vascular: No hyperdense vessel or unexpected calcification. Skull: Normal. Negative for fracture or focal lesion. Sinuses/Orbits: Polypoid mucosal thickening of bilateral maxillary sinuses, bilateral ethmoid sinuses and less so frontal sinuses. Gas fluid level within the left sphenoid sinus. Osseous remodeling of the maxillary sinus walls, suggestive of chronic sinusitis. Other: None. IMPRESSION: No acute intracranial abnormality. Mild but advanced for age brain parenchymal atrophy and chronic microvascular disease. Pansinusitis with evidence of chronic  sinusitis of the maxillary sinuses. Electronically Signed   By: Fidela Salisbury M.D.   On: 06/01/2017 15:13    Pending Labs Unresulted Labs (From admission, onward)   Start     Ordered   06/02/17 0500  CBC  Tomorrow morning,   R     06/01/17 1935   06/02/17 3419  Basic metabolic panel  Tomorrow morning,   R     06/01/17 1935   06/02/17 0500  Prealbumin  Tomorrow morning,   R     06/01/17 1935   06/02/17 0500  Iron and TIBC  Tomorrow morning,   R     06/01/17 1935   06/02/17 0500  HIV antibody (Routine Testing)  Tomorrow morning,   R     06/01/17 1936   06/01/17 1933  TSH  Add-on,   R     06/01/17 1935   06/01/17 1919  Magnesium  STAT,   STAT     06/01/17 1918      Vitals/Pain Today's Vitals   06/01/17 1650 06/01/17 1851 06/01/17 1900  06/01/17 1931  BP: (!) 138/91 124/90 (!) 136/96 (!) 129/91  Pulse: 86 91 89 87  Resp: (!) '23 17 18 17  ' Temp:      TempSrc:      SpO2: 99% 98% 100% 100%    Isolation Precautions No active isolations  Medications Medications  0.9 %  sodium chloride infusion ( Intravenous Stopped 06/01/17 1852)  potassium chloride 10 mEq in 100 mL IVPB (10 mEq Intravenous New Bag/Given 06/01/17 1857)  enoxaparin (LOVENOX) injection 40 mg (not administered)  ondansetron (ZOFRAN) tablet 4 mg (not administered)    Or  ondansetron (ZOFRAN) injection 4 mg (not administered)  acetaminophen (TYLENOL) tablet 650 mg (not administered)    Or  acetaminophen (TYLENOL) suppository 650 mg (not administered)  ipratropium-albuterol (DUONEB) 0.5-2.5 (3) MG/3ML nebulizer solution 3 mL (not administered)  diazepam (VALIUM) tablet 5 mg (not administered)  LORazepam (ATIVAN) injection 1-2 mg (not administered)  sodium chloride 0.9 % bolus 500 mL (0 mLs Intravenous Stopped 06/01/17 1710)  potassium chloride SA (K-DUR,KLOR-CON) CR tablet 40 mEq (40 mEq Oral Given 06/01/17 1746)    Mobility walks

## 2017-06-01 NOTE — H&P (Addendum)
History and Physical    Crystal Spence GQQ:[id number] DOB: 1976/03/22 DOA: 06/01/2017  Referring MD/NP/PA: Dr. Nat Christen PCP: Benito Mccreedy, MD  Patient coming from: Home via EMS  Chief Complaint: Seizure  I have personally briefly reviewed patient's old medical records in [place]   HPI: Crystal Spence is a 42 y.o. female with medical history significant of HTN, hypothyroidism, anemia, H/O laryngeal cancer, tracheocutaneous fistula s/p closure tobacco abuse, and alcohol abuse; who presented after having what was reported as seizure-like activity.  Much of history is obtained from review of records and discussions with family members.  Patient had been talking to her relative when she falls drew arms inward and began to shake.  EMS was called immediately and patient was noted to be in a postictal state following some commands.  Initial blood sugar was 139 and patient was found to be slightly tachycardic at 104.  Family notes that she had been recently sick with pneumonia.To family's knowledge she has never had seizures before.  however, they note that she drinks vodka heavily whenever she can get it.  She still currently smokes tobacco.  Patient notes that she had had some vomiting over the last few days.  ED Course: Upon admission into the emergency department patient was seen to be afebrile, pulse 86-101, respirations 17-28, and all other vital signs maintained.  Labs revealed WBC 5.8, hemoglobin 9.7, sodium 131, potassium<2, albumin 2.8, AST 48, ALT 23, alcohol level <10, i-STAT Hgb 13.9.  UDS was positive for opiates, but negative for benzodiazepines.  Urinalysis was negative for any signs of infection.  CT scan of the head showed no acute abnormalities and signs of pansinusitis with chronic sinusitis of the maxillary sinus.  Patient was given 40 mEq of potassium chloride orally and 50 mEq IV and placed on normal saline at 100 mL/h.  Neurology was consulted and recommended  patient be placed on Valium 3 times daily.   Review of Systems  Unable to perform ROS: Mental status change  Constitutional: Positive for malaise/fatigue.  Respiratory: Positive for cough.   Gastrointestinal: Positive for vomiting.  Musculoskeletal: Positive for myalgias.  Neurological: Positive for seizures and weakness.    Past Medical History:  Diagnosis Date  . Anemia, unspecified 06/17/2013  . Headache   . History of laryngeal cancer 02/03/2013  . Hypertension   . Hypothyroidism (acquired) 06/17/2013  . Multiple lung nodules 06/28/2014  . Pneumonia 01/14/2014, 04/2017  . Poor oral hygiene 06/17/2013  . Rib pain 01/14/2014  . Rib pain on left side 01/05/2014  . Throat cancer [facility])     Past Surgical History:  Procedure Laterality Date  . ABDOMINAL SURGERY    . COLONOSCOPY WITH PROPOFOL N/A 10/12/2015   Procedure: COLONOSCOPY WITH PROPOFOL;  Surgeon: Wonda Horner, MD;  Location: [facility] ENDOSCOPY;  Service: Endoscopy;  Laterality: N/A;  . ESOPHAGOGASTRODUODENOSCOPY N/A 02/03/2013   Procedure: ESOPHAGOGASTRODUODENOSCOPY (EGD);  Surgeon: Juanita Craver, MD;  Location: [facility];  Service: Endoscopy;  Laterality: N/A;  . ESOPHAGOGASTRODUODENOSCOPY N/A 10/12/2015   Procedure: ESOPHAGOGASTRODUODENOSCOPY (EGD);  Surgeon: Wonda Horner, MD;  Location: [facility];  Service: Endoscopy;  Laterality: N/A;  . 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   Procedure: TRACHEOCUTANEOUS FISTULA REPAIR;  Surgeon: Izora Gala, MD;  Location: [place];  Service: ENT;  Laterality: N/A;  . TRACHEOSTOMY     for throat inflammation from radiation  . TUBAL LIGATION       reports that she quit smoking about 6 years ago.  She has a 15.00 pack-year smoking history. she has never used smokeless tobacco. She reports that she drinks alcohol. She reports that she does not use drugs.  Allergies  Allergen Reactions  . No Known Allergies     Family History  Problem Relation Age of Onset  . Cancer Maternal Grandmother         skin cancer    Prior to Admission medications   Medication Sig Start Date End Date Taking? Authorizing Provider  albuterol (PROVENTIL HFA;VENTOLIN HFA) 108 (90 Base) MCG/ACT inhaler Inhale 2 puffs into the lungs every 2 (two) hours as needed for wheezing or shortness of breath (cough). 02/20/17  Yes Ripley Fraise, MD  butalbital-acetaminophen-caffeine (FIORICET, ESGIC) [phone] MG tablet Take 1 tablet by mouth 3 (three) times daily as needed for headache or migraine.  07/28/15   [provider]  CARTIA XT 240 MG 24 hr capsule Take 240 mg by mouth daily. 09/06/15   [provider]  clonazePAM (KLONOPIN) 0.5 MG tablet Take 0.5 mg by mouth daily as needed for anxiety. 12/15/15   [provider]  levothyroxine (SYNTHROID, LEVOTHROID) 50 MCG tablet Take 50 mcg by mouth daily before breakfast.    [provider]  losartan (COZAAR) 25 MG tablet Take 25 mg by mouth daily. 06/28/15   [provider]  montelukast (SINGULAIR) 10 MG tablet TAKE 1 TABLET BY MOUTH AT BEDTIME 11/12/15   [provider]  potassium chloride SA (K-DUR,KLOR-CON) 20 MEQ tablet Take 20 mEq by mouth 2 (two) times daily.    [provider]  topiramate (TOPAMAX) 50 MG tablet Take 100 mg by mouth 2 (two) times daily.     [provider]  traZODone (DESYREL) 100 MG tablet Take 100 mg by mouth at bedtime. 08/15/15   [provider]    Physical Exam:  Constitutional: Cachectic appearing middle aged female who is lethargic, but will follow simple commands. Vitals:   06/01/17 1342 06/01/17 1650 06/01/17 1851  BP: (!) 144/101 (!) 138/91 124/90  Pulse: (!) 101 86 91  Resp: (!) 28 (!) 23 17  Temp: 98.1 F (36.7 C)    TempSrc: Oral    SpO2: 100% 99% 98%   Eyes: PERRL, lids and conjunctivae normal ENMT: Mucous membranes are dry.  Posterior pharynx clear of any exudate or lesions. Poor dentition.  Neck: normal, supple, no masses, no  thyromegaly Respiratory: Normal respiratory effort with coarse breath sounds and rales appreciated when patient coughing.  No significant wheezes appreciated. Cardiovascular: Regular rate and rhythm, no murmurs / rubs / gallops.  +1 pitting lower extremity edema. 2+ pedal pulses. No carotid bruits.   Abdomen: no tenderness, no masses palpated. No hepatosplenomegaly. Bowel sounds positive.  Musculoskeletal: no clubbing / cyanosis. No joint deformity upper and lower extremities. Good ROM, no contractures. Normal muscle tone.  Skin: no rashes, lesions, ulcers. No induration Neurologic: CN 2-12 grossly intact.  Able to move all extremities. Psychiatric: Awake, but slow in responses.  Oriented to person and place at this time.  Labs on Admission: I have personally reviewed following labs and imaging studies  CBC: Recent Labs  Lab 06/01/17 1428  WBC 5.8  NEUTROABS 5.0  HGB 9.7*  HCT 30.1*  MCV 87.2  PLT 850   Basic Metabolic Panel: Recent Labs  Lab 06/01/17 1428  NA 131*  K <2.0*  CL 92*  CO2 28  GLUCOSE 109*  BUN 6  CREATININE 0.64  CALCIUM 8.8*   GFR: CrCl cannot be calculated (  Unknown ideal weight.). Liver Function Tests: Recent Labs  Lab 06/01/17 1428  AST 35  ALT 25  ALKPHOS 132*  BILITOT 1.1  PROT 5.9*  ALBUMIN 2.7*   Recent Labs  Lab 06/01/17 1428  LIPASE 18   No results for input(s): AMMONIA in the last 168 hours. Coagulation Profile: No results for input(s): INR, PROTIME in the last 168 hours. Cardiac Enzymes: No results for input(s): CKTOTAL, CKMB, CKMBINDEX, TROPONINI in the last 168 hours. BNP (last 3 results) No results for input(s): PROBNP in the last 8760 hours. HbA1C: No results for input(s): HGBA1C in the last 72 hours. CBG: Recent Labs  Lab 06/01/17 1345  GLUCAP 120*   Lipid Profile: No results for input(s): CHOL, HDL, LDLCALC, TRIG, CHOLHDL, LDLDIRECT in the last 72 hours. Thyroid Function Tests: No results for input(s): TSH,  T4TOTAL, FREET4, T3FREE, THYROIDAB in the last 72 hours. Anemia Panel: No results for input(s): VITAMINB12, FOLATE, FERRITIN, TIBC, IRON, RETICCTPCT in the last 72 hours. Urine analysis:    Component Value Date/Time   COLORURINE YELLOW 06/01/2017 [place] 06/01/2017 1428   LABSPEC 1.010 06/01/2017 1428   PHURINE 6.0 06/01/2017 1428   GLUCOSEU NEGATIVE 06/01/2017 1428   HGBUR NEGATIVE 06/01/2017 1428   BILIRUBINUR NEGATIVE 06/01/2017 1428   KETONESUR NEGATIVE 06/01/2017 1428   PROTEINUR NEGATIVE 06/01/2017 1428   UROBILINOGEN 0.2 12/06/2013 1718   NITRITE NEGATIVE 06/01/2017 1428   LEUKOCYTESUR NEGATIVE 06/01/2017 1428   Sepsis Labs: No results found for this or any previous visit (from the past 240 hour(s)).   Radiological Exams on Admission: Dg Chest 2 View  Result Date: 06/01/2017 CLINICAL DATA:  pt was talking to a relative when she paused , drew arms inward and began to shake HTN, h/o multiple lung nodules (06/17/2013), h/o PNA, h/o throat cancer, former smoker x 6 years ago EXAM: CHEST  2 VIEW COMPARISON:  04/12/2017 FINDINGS: Normal cardiac silhouette. Scoliosis. Calcified granulomas scattered through both lungs unchanged. No effusion, infiltrate pneumothorax. IMPRESSION: 1.  No acute cardiopulmonary process. 2. Sigmoid scoliosis. Electronically Signed   By: Suzy Bouchard M.D.   On: 06/01/2017 14:44   Ct Head Wo Contrast  Result Date: 06/01/2017 CLINICAL DATA:  Seizure. EXAM: CT HEAD WITHOUT CONTRAST TECHNIQUE: Contiguous axial images were obtained from the base of the skull through the vertex without intravenous contrast. COMPARISON:  None. FINDINGS: Brain: No evidence of acute infarction, hemorrhage, hydrocephalus, extra-axial collection or mass lesion/mass effect. Advanced for age brain parenchymal atrophy. Mild periventricular microangiopathy. Vascular: No hyperdense vessel or unexpected calcification. Skull: Normal. Negative for fracture or focal lesion.  Sinuses/Orbits: Polypoid mucosal thickening of bilateral maxillary sinuses, bilateral ethmoid sinuses and less so frontal sinuses. Gas fluid level within the left sphenoid sinus. Osseous remodeling of the maxillary sinus walls, suggestive of chronic sinusitis. Other: None. IMPRESSION: No acute intracranial abnormality. Mild but advanced for age brain parenchymal atrophy and chronic microvascular disease. Pansinusitis with evidence of chronic sinusitis of the maxillary sinuses. Electronically Signed   By: Fidela Salisbury M.D.   On: 06/01/2017 15:13    EKG: Independently reviewed.  Sinus rhythm at 98 bpm  Assessment/Plan Seizure-like activity: Acute.  Family describes what sounds like seizure-like activity prior to arrival.  Patient reportedly somewhat postictal thereafter.  Significant history of alcohol abuse question possibility of seizure related to alcohol withdrawal vs. benzo withdrawal vs. arrhythmia due to significant hypo-kalemia vs. other. - Admit to a telemetry bed - Seizure precautions - Monitor for seizure activity - CWIA  protocols   - Appreciate neurology consultative services, will follow-up for further recommendation - Valium per neurology  Severe hypokalemia: Initial potassium noted to be less than 2.  Patient ordered to be given 40 mEq of potassium chloride orally and 50 mEq IV. - Follow-up magnesium  - Continue to monitor and replace electrolytes as needed   Alcohol and tobacco abuse: Patient reportedly drinks a significant amount of vodka daily when able. - CWIA protocols - Nicotine patch provided - Counsel need of cessation of tobacco and alcohol  Normocytic normochromic anemia: Hemoglobin 9.7 which appears similar to patient's baseline which runs between 9-10 g/dL.  Noted to have elevated RDW question possibility of iron deficiency. - Check iron and TIBC in a.m. - Recheck CBC in a.m.  Essential hypertension: Unclear if patient taking losartan or diltiazem -  Follow-up to determine what medications patient is supposed to be on  Transaminitis: Chronic.  Patient with elevated AST of 48 with ALT 23.  Elevated AST to ALT ratio which appears chronic suggest alcohol abuse.  Positive  pregnancy screen: HCG elevated at 13.9 - Consider rechecking a confirmatory test in 48 hours  Hypothyroidism - Check TSH - Continue levothyroxine  Hypoalbuminemia - Check prealbumin in a.m.  H/O laryngeal cancer  Lower extremity edema: Patient noted to have 1+ pitting lower extremity edema question if secondary to malnutrition vs. hyponatremia vs.   GI prophylaxis: Protonix DVT prophylaxis: Lovenox Code Status: Full code Family Communication: No family present at bedside Disposition Plan: Likely discharge home if workup negative Consults called: Neurology Admission status: Observation  Norval Morton MD [facility] Pager [phone]   If 7PM-7AM, please contact night-coverage [email or web address] Password [facility]  06/01/2017, 7:16 PM

## 2017-06-01 NOTE — ED Notes (Signed)
Pt was resting without complaints, denied pain until family arrives who is insistent pt is in pain. Pt unable to state what is hurting but family encourages her to say her left arm where IV was is hurting, they are requesting we give her pain meds. Warm compress and blanket placed around arm.

## 2017-06-01 NOTE — ED Triage Notes (Signed)
EMS reports pt was talking to a relative when she paused , drew arms inward and began to shake, pt post ictal upon arrival of EMS. She remains post ictal but will follow some commands. #22 L AC CBG139 BP138/106-104-100%

## 2017-06-01 NOTE — ED Notes (Signed)
Bed: WA17 Expected date:  Expected time:  Means of arrival:  Comments: 42 yo seizure w/o hx

## 2017-06-02 ENCOUNTER — Observation Stay (HOSPITAL_COMMUNITY): Payer: Medicaid Other

## 2017-06-02 DIAGNOSIS — R51 Headache: Secondary | ICD-10-CM | POA: Diagnosis present

## 2017-06-02 DIAGNOSIS — W19XXXA Unspecified fall, initial encounter: Secondary | ICD-10-CM | POA: Diagnosis present

## 2017-06-02 DIAGNOSIS — E039 Hypothyroidism, unspecified: Secondary | ICD-10-CM | POA: Diagnosis present

## 2017-06-02 DIAGNOSIS — R1110 Vomiting, unspecified: Secondary | ICD-10-CM | POA: Diagnosis present

## 2017-06-02 DIAGNOSIS — D638 Anemia in other chronic diseases classified elsewhere: Secondary | ICD-10-CM | POA: Diagnosis present

## 2017-06-02 DIAGNOSIS — E876 Hypokalemia: Secondary | ICD-10-CM

## 2017-06-02 DIAGNOSIS — Y92009 Unspecified place in unspecified non-institutional (private) residence as the place of occurrence of the external cause: Secondary | ICD-10-CM | POA: Diagnosis not present

## 2017-06-02 DIAGNOSIS — R000 Tachycardia, unspecified: Secondary | ICD-10-CM | POA: Diagnosis present

## 2017-06-02 DIAGNOSIS — R740 Nonspecific elevation of levels of transaminase and lactic acid dehydrogenase [LDH]: Secondary | ICD-10-CM | POA: Diagnosis present

## 2017-06-02 DIAGNOSIS — Y900 Blood alcohol level of less than 20 mg/100 ml: Secondary | ICD-10-CM | POA: Diagnosis present

## 2017-06-02 DIAGNOSIS — F17200 Nicotine dependence, unspecified, uncomplicated: Secondary | ICD-10-CM | POA: Diagnosis present

## 2017-06-02 DIAGNOSIS — J40 Bronchitis, not specified as acute or chronic: Secondary | ICD-10-CM | POA: Diagnosis not present

## 2017-06-02 DIAGNOSIS — Z681 Body mass index (BMI) 19 or less, adult: Secondary | ICD-10-CM | POA: Diagnosis not present

## 2017-06-02 DIAGNOSIS — Z79899 Other long term (current) drug therapy: Secondary | ICD-10-CM | POA: Diagnosis not present

## 2017-06-02 DIAGNOSIS — F1010 Alcohol abuse, uncomplicated: Secondary | ICD-10-CM

## 2017-06-02 DIAGNOSIS — R569 Unspecified convulsions: Principal | ICD-10-CM

## 2017-06-02 DIAGNOSIS — J209 Acute bronchitis, unspecified: Secondary | ICD-10-CM

## 2017-06-02 DIAGNOSIS — E43 Unspecified severe protein-calorie malnutrition: Secondary | ICD-10-CM | POA: Diagnosis present

## 2017-06-02 DIAGNOSIS — Z3201 Encounter for pregnancy test, result positive: Secondary | ICD-10-CM

## 2017-06-02 DIAGNOSIS — R600 Localized edema: Secondary | ICD-10-CM | POA: Diagnosis present

## 2017-06-02 DIAGNOSIS — M7989 Other specified soft tissue disorders: Secondary | ICD-10-CM | POA: Diagnosis not present

## 2017-06-02 DIAGNOSIS — R64 Cachexia: Secondary | ICD-10-CM | POA: Diagnosis present

## 2017-06-02 DIAGNOSIS — T381X6A Underdosing of thyroid hormones and substitutes, initial encounter: Secondary | ICD-10-CM | POA: Diagnosis present

## 2017-06-02 DIAGNOSIS — E8809 Other disorders of plasma-protein metabolism, not elsewhere classified: Secondary | ICD-10-CM | POA: Diagnosis present

## 2017-06-02 DIAGNOSIS — F10231 Alcohol dependence with withdrawal delirium: Secondary | ICD-10-CM | POA: Diagnosis present

## 2017-06-02 DIAGNOSIS — I10 Essential (primary) hypertension: Secondary | ICD-10-CM | POA: Diagnosis present

## 2017-06-02 DIAGNOSIS — Z8521 Personal history of malignant neoplasm of larynx: Secondary | ICD-10-CM | POA: Diagnosis not present

## 2017-06-02 DIAGNOSIS — Z85819 Personal history of malignant neoplasm of unspecified site of lip, oral cavity, and pharynx: Secondary | ICD-10-CM | POA: Diagnosis not present

## 2017-06-02 DIAGNOSIS — D649 Anemia, unspecified: Secondary | ICD-10-CM | POA: Diagnosis not present

## 2017-06-02 LAB — CBC
HEMATOCRIT: 27.1 % — AB (ref 36.0–46.0)
HEMOGLOBIN: 8.7 g/dL — AB (ref 12.0–15.0)
MCH: 27.9 pg (ref 26.0–34.0)
MCHC: 32.1 g/dL (ref 30.0–36.0)
MCV: 86.9 fL (ref 78.0–100.0)
Platelets: 350 10*3/uL (ref 150–400)
RBC: 3.12 MIL/uL — ABNORMAL LOW (ref 3.87–5.11)
RDW: 17.7 % — ABNORMAL HIGH (ref 11.5–15.5)
WBC: 4 10*3/uL (ref 4.0–10.5)

## 2017-06-02 LAB — BASIC METABOLIC PANEL
Anion gap: 4 — ABNORMAL LOW (ref 5–15)
Anion gap: 5 (ref 5–15)
BUN: 5 mg/dL — ABNORMAL LOW (ref 6–20)
BUN: 5 mg/dL — AB (ref 6–20)
CHLORIDE: 105 mmol/L (ref 101–111)
CO2: 25 mmol/L (ref 22–32)
CO2: 25 mmol/L (ref 22–32)
Calcium: 8.2 mg/dL — ABNORMAL LOW (ref 8.9–10.3)
Calcium: 8 mg/dL — ABNORMAL LOW (ref 8.9–10.3)
Chloride: 106 mmol/L (ref 101–111)
Creatinine, Ser: 0.54 mg/dL (ref 0.44–1.00)
Creatinine, Ser: 0.56 mg/dL (ref 0.44–1.00)
GFR calc Af Amer: >60 mL/min (ref >60)
GFR calc non Af Amer: >60 mL/min (ref >60)
GLUCOSE: 106 mg/dL — AB (ref 65–99)
Glucose, Bld: 88 mg/dL (ref 65–99)
POTASSIUM: 2.8 mmol/L — AB (ref 3.5–5.1)
Potassium: 2.7 mmol/L — CL (ref 3.5–5.1)
SODIUM: 134 mmol/L — AB (ref 135–145)
SODIUM: 136 mmol/L (ref 135–145)

## 2017-06-02 LAB — TSH: TSH: 42.084 u[IU]/mL — AB (ref 0.350–4.500)

## 2017-06-02 LAB — IRON AND TIBC
IRON: 89 ug/dL (ref 28–170)
SATURATION RATIOS: 37 % — AB (ref 10.4–31.8)
TIBC: 238 ug/dL — AB (ref 250–450)
UIBC: 149 ug/dL

## 2017-06-02 LAB — MAGNESIUM: MAGNESIUM: 2.1 mg/dL (ref 1.7–2.4)

## 2017-06-02 LAB — HIV ANTIBODY (ROUTINE TESTING W REFLEX): HIV Screen 4th Generation wRfx: NONREACTIVE

## 2017-06-02 LAB — PREALBUMIN: PREALBUMIN: 6.5 mg/dL — AB (ref 18–38)

## 2017-06-02 MED ORDER — POTASSIUM CHLORIDE CRYS ER 20 MEQ PO TBCR
40.0000 meq | EXTENDED_RELEASE_TABLET | ORAL | Status: AC
  Administered 2017-06-02 (×2): 40 meq via ORAL
  Filled 2017-06-02 (×2): qty 2

## 2017-06-02 MED ORDER — HYDROCODONE-HOMATROPINE 5-1.5 MG/5ML PO SYRP
5.0000 mL | ORAL_SOLUTION | Freq: Four times a day (QID) | ORAL | Status: DC | PRN
  Administered 2017-06-02 – 2017-06-05 (×6): 5 mL via ORAL
  Filled 2017-06-02 (×7): qty 5

## 2017-06-02 MED ORDER — GUAIFENESIN-DM 100-10 MG/5ML PO SYRP
5.0000 mL | ORAL_SOLUTION | ORAL | Status: DC | PRN
  Administered 2017-06-02: 5 mL via ORAL
  Filled 2017-06-02: qty 10

## 2017-06-02 MED ORDER — BOOST / RESOURCE BREEZE PO LIQD CUSTOM
1.0000 | Freq: Three times a day (TID) | ORAL | Status: DC
  Administered 2017-06-02 – 2017-06-04 (×6): 1 via ORAL

## 2017-06-02 MED ORDER — IPRATROPIUM-ALBUTEROL 0.5-2.5 (3) MG/3ML IN SOLN
3.0000 mL | Freq: Four times a day (QID) | RESPIRATORY_TRACT | Status: DC
  Administered 2017-06-02 (×2): 3 mL via RESPIRATORY_TRACT
  Filled 2017-06-02 (×2): qty 3

## 2017-06-02 MED ORDER — ONDANSETRON 4 MG PO TBDP
4.0000 mg | ORAL_TABLET | Freq: Four times a day (QID) | ORAL | Status: DC | PRN
Start: 2017-06-02 — End: 2017-06-02

## 2017-06-02 MED ORDER — LORATADINE 10 MG PO TABS
10.0000 mg | ORAL_TABLET | Freq: Every day | ORAL | Status: DC
  Administered 2017-06-02 – 2017-06-05 (×4): 10 mg via ORAL
  Filled 2017-06-02 (×4): qty 1

## 2017-06-02 MED ORDER — IPRATROPIUM-ALBUTEROL 0.5-2.5 (3) MG/3ML IN SOLN
3.0000 mL | RESPIRATORY_TRACT | Status: DC | PRN
  Administered 2017-06-05: 3 mL via RESPIRATORY_TRACT
  Filled 2017-06-02: qty 3

## 2017-06-02 MED ORDER — CHLORDIAZEPOXIDE HCL 25 MG PO CAPS
25.0000 mg | ORAL_CAPSULE | Freq: Four times a day (QID) | ORAL | Status: AC
  Administered 2017-06-02 (×4): 25 mg via ORAL
  Filled 2017-06-02 (×4): qty 1

## 2017-06-02 MED ORDER — CHLORDIAZEPOXIDE HCL 25 MG PO CAPS
25.0000 mg | ORAL_CAPSULE | Freq: Every day | ORAL | Status: AC
  Administered 2017-06-05: 25 mg via ORAL
  Filled 2017-06-02: qty 1

## 2017-06-02 MED ORDER — AMOXICILLIN-POT CLAVULANATE 875-125 MG PO TABS
1.0000 | ORAL_TABLET | Freq: Two times a day (BID) | ORAL | Status: DC
  Administered 2017-06-02 – 2017-06-05 (×7): 1 via ORAL
  Filled 2017-06-02 (×7): qty 1

## 2017-06-02 MED ORDER — POLYETHYLENE GLYCOL 3350 17 G PO PACK
17.0000 g | PACK | Freq: Two times a day (BID) | ORAL | Status: DC
  Administered 2017-06-02 – 2017-06-05 (×4): 17 g via ORAL
  Filled 2017-06-02 (×5): qty 1

## 2017-06-02 MED ORDER — BUDESONIDE 0.5 MG/2ML IN SUSP
0.5000 mg | Freq: Two times a day (BID) | RESPIRATORY_TRACT | Status: DC
  Administered 2017-06-02 – 2017-06-04 (×5): 0.5 mg via RESPIRATORY_TRACT
  Filled 2017-06-02 (×6): qty 2

## 2017-06-02 MED ORDER — PHENOL 1.4 % MT LIQD
1.0000 | OROMUCOSAL | Status: DC | PRN
  Administered 2017-06-02: 1 via OROMUCOSAL
  Filled 2017-06-02: qty 177

## 2017-06-02 MED ORDER — HYDROXYZINE HCL 25 MG PO TABS: 25.0000 mg | ORAL_TABLET | Freq: Four times a day (QID) | ORAL | Status: AC | PRN

## 2017-06-02 MED ORDER — FLUTICASONE PROPIONATE 50 MCG/ACT NA SUSP
2.0000 | Freq: Every day | NASAL | Status: DC
  Administered 2017-06-02 – 2017-06-05 (×4): 2 via NASAL
  Filled 2017-06-02: qty 16

## 2017-06-02 MED ORDER — SODIUM CHLORIDE 0.9 % IV SOLN
INTRAVENOUS | Status: DC
  Administered 2017-06-02 – 2017-06-03 (×2): via INTRAVENOUS
  Filled 2017-06-02 (×3): qty 1000

## 2017-06-02 MED ORDER — PANTOPRAZOLE SODIUM 40 MG PO TBEC
40.0000 mg | DELAYED_RELEASE_TABLET | Freq: Every day | ORAL | Status: DC
  Administered 2017-06-02 – 2017-06-05 (×4): 40 mg via ORAL
  Filled 2017-06-02 (×4): qty 1

## 2017-06-02 MED ORDER — CHLORDIAZEPOXIDE HCL 25 MG PO CAPS
25.0000 mg | ORAL_CAPSULE | Freq: Three times a day (TID) | ORAL | Status: AC
  Administered 2017-06-03 (×3): 25 mg via ORAL
  Filled 2017-06-02 (×3): qty 1

## 2017-06-02 MED ORDER — GUAIFENESIN ER 600 MG PO TB12
1200.0000 mg | ORAL_TABLET | Freq: Two times a day (BID) | ORAL | Status: DC
  Administered 2017-06-02 – 2017-06-05 (×6): 1200 mg via ORAL
  Filled 2017-06-02 (×6): qty 2

## 2017-06-02 MED ORDER — CHLORDIAZEPOXIDE HCL 25 MG PO CAPS
25.0000 mg | ORAL_CAPSULE | ORAL | Status: AC
  Administered 2017-06-04 (×2): 25 mg via ORAL
  Filled 2017-06-02 (×2): qty 1

## 2017-06-02 MED ORDER — MAGNESIUM SULFATE 2 GM/50ML IV SOLN
2.0000 g | Freq: Once | INTRAVENOUS | Status: AC
  Administered 2017-06-02: 2 g via INTRAVENOUS
  Filled 2017-06-02: qty 50

## 2017-06-02 MED ORDER — LOPERAMIDE HCL 2 MG PO CAPS: 2.0000 mg | ORAL_CAPSULE | ORAL | Status: AC | PRN

## 2017-06-02 MED ORDER — SORBITOL 70 % SOLN
30.0000 mL | Freq: Once | Status: AC
  Administered 2017-06-02: 30 mL via ORAL
  Filled 2017-06-02: qty 30

## 2017-06-02 MED ORDER — ENOXAPARIN SODIUM 30 MG/0.3ML ~~LOC~~ SOLN
30.0000 mg | SUBCUTANEOUS | Status: DC
  Administered 2017-06-02 – 2017-06-04 (×3): 30 mg via SUBCUTANEOUS
  Filled 2017-06-02 (×3): qty 0.3

## 2017-06-02 MED ORDER — SENNOSIDES-DOCUSATE SODIUM 8.6-50 MG PO TABS
1.0000 | ORAL_TABLET | Freq: Every day | ORAL | Status: DC
  Administered 2017-06-02 – 2017-06-04 (×3): 1 via ORAL
  Filled 2017-06-02 (×3): qty 1

## 2017-06-02 MED ORDER — CHLORDIAZEPOXIDE HCL 25 MG PO CAPS: 25.0000 mg | ORAL_CAPSULE | Freq: Four times a day (QID) | ORAL | Status: AC | PRN

## 2017-06-02 MED ORDER — IPRATROPIUM-ALBUTEROL 0.5-2.5 (3) MG/3ML IN SOLN
3.0000 mL | Freq: Three times a day (TID) | RESPIRATORY_TRACT | Status: DC
  Administered 2017-06-03 – 2017-06-04 (×4): 3 mL via RESPIRATORY_TRACT
  Filled 2017-06-02 (×4): qty 3

## 2017-06-02 NOTE — Care Management Note (Signed)
Case Management Note  Patient Details  Name: Crystal Spence MRN: [id number] Date of Birth: Feb 27, 1976  Subjective/Objective:                  Seizures versus metabolic reason/etoh w/d  Action/Plan: Date:  June 02, 2017 Chart reviewed for concurrent status and case management needs.  Will continue to follow patient progress.  Discharge Planning: following for needs.  None present at this time of review. Expected discharge date: June 05, 2017 Velva Harman, BSN, [place], [place]   Expected Discharge Date:                  Expected Discharge Plan:  Home/Self Care  In-House Referral:     Discharge planning Services  CM Consult  Post Acute Care Choice:    Choice offered to:     DME Arranged:    DME Agency:     HH Arranged:    HH Agency:     Status of Service:  In process, will continue to follow  If discussed at Long Length of Stay Meetings, dates discussed:    Additional Comments:  Leeroy Cha, RN 06/02/2017, 10:53 AM

## 2017-06-02 NOTE — Progress Notes (Signed)
PROGRESS NOTE    Crystal Spence  HYQ:[id number] DOB: 08-21-1975 DOA: 06/01/2017 PCP: Trey Sailors, PA   Brief Narrative:  Patient is a 42 year old female history of hypertension, hypothyroidism, anemia, history of laryngeal cancer, tracheocutaneous fistula status post closure, tobacco abuse, ongoing alcohol abuse who presented after having seizure-like activity.  CT head negative.  Potassium significantly low at less than 2.  Patient was noted to have a alcohol level of less than 10.  Neurology consulted.   Assessment & Plan:   Principal Problem:   Observed seizure-like activity (HCC) Active Problems:   Alcohol abuse   Anemia in chronic illness   Hypothyroidism (acquired)   Chronic anemia   Essential hypertension   Positive pregnancy test  #1 observe seizure-like activity Differential includes alcohol withdrawal seizures versus partial seizure.  Patient with no prior history of seizures.  Alcohol level was less than 10 on admission.  Patient with no further seizure activity noted.  CT head negative.  Replete electrolytes.  Discontinue Valium and placed on the Librium detox protocol.  IV Ativan as needed.  Patient seen in consultation by neurology who feels patient does not need to be started on AED however does recommend benzo taper.  Will check a 2D echo.  No driving for at least 6 months.  IV fluids.  Supportive care.  2.  Probable acute bronchitis Patient with rhonchorous breath sounds.  Patient states was recently diagnosed and placed on antibiotics for probable pneumonia.  Patient cannot remember what antibiotic she was on.  CT head was concerning for chronic sinusitis.  We will repeat chest x-ray.  Place patient on Augmentin.  Start Pulmicort, Flonase, Claritin, scheduled duo nebs, PPI.  3.  Severe Hypokalemia Check a magnesium level.  Replete.  4.  Hypothyroidism TSH elevated at 42.084.  Patient states was not really compliant this past week with a thyroid  medication.  Patient states thyroid medication was recently adjusted/changed per PCP 1-2 weeks prior to admission.  Continue home dose Synthroid.  Will likely need repeat thyroid function studies done in about 4-6 weeks.  Outpatient follow-up.  5.  Chronic anemia Follow H&H.  6.??  Positive pregnancy test Patient per RN has had her tubes tied.  We will repeat serum hCG in about 48-72 hours and if levels are not increasing then likely a biologic false positive.  If levels are increasing will formally consult with GYN/oncology.  Follow.  7.  Alcohol abuse Discontinue Valium.  Placed on the Librium detox protocol.  IV Ativan as needed.  IV fluids, supportive care, thiamine, folic acid, multivitamin.  Consulted social work.    8 tobacco abuse Tobacco cessation.  Nicotine patch.   DVT prophylaxis: Lovenox Code Status: Full Family Communication: Updated patient.  No family at bedside. Disposition Plan: Home once medically stable, electrolytes repleted, completion of CIWA.   Consultants:   Neurology:Dr Aroor 06/02/2017  Procedures:   CT head 06/01/2017  Chest x-ray 06/02/2017, 06/01/2017  Antimicrobials: None   Subjective: Patient states she is feeling a whole lot better than she did on admission.  Patient still with a rhonchorous cough.  Patient states was not totally compliant with his Synthroid medication and had not taken it consistently over the past week.  Patient denies any chest pain.  No shortness of breath.  No further seizures noted per nursing.  Objective: Vitals:   06/01/17 1900 06/01/17 1931 06/01/17 2038 06/02/17 0256  BP: (!) 136/96 (!) 129/91 (!) 136/95 (!) 129/93  Pulse: 89 87 84  83  Resp: 18 17 18 17   Temp:   98.7 F (37.1 C) 98 F (36.7 C)  TempSrc:   Oral Oral  SpO2: 100% 100% 100% 100%  Weight:   38.4 kg (84 lb 10.5 oz)   Height:   5\' 1"  (1.549 m)     Intake/Output Summary (Last 24 hours) at 06/02/2017 1238 Last data filed at 06/02/2017 0820 Gross per  24 hour  Intake 2526.67 ml  Output -  Net 2526.67 ml   Filed Weights   06/01/17 2038  Weight: 38.4 kg (84 lb 10.5 oz)    Examination:  General exam: Appears calm and comfortable  Respiratory system: Coarse rhonchorous diffuse breath sounds.  No wheezing.  No crackles.   Cardiovascular system: S1 & S2 heard, RRR. No JVD, murmurs, rubs, gallops or clicks. No pedal edema. Gastrointestinal system: Abdomen is nondistended, soft and nontender. No organomegaly or masses felt. Normal bowel sounds heard. Central nervous system: Alert and oriented. No focal neurological deficits. Extremities: Symmetric 5 x 5 power. Skin: No rashes, lesions or ulcers Psychiatry: Judgement and insight appear normal. Mood & affect appropriate.     Data Reviewed: I have personally reviewed following labs and imaging studies  CBC: Recent Labs  Lab 06/01/17 1428 06/02/17 0620  WBC 5.8 4.0  NEUTROABS 5.0  --   HGB 9.7* 8.7*  HCT 30.1* 27.1*  MCV 87.2 86.9  PLT 327 761   Basic Metabolic Panel: Recent Labs  Lab 06/01/17 1428 06/02/17 0620  NA 131* 134*  K <2.0* 2.8*  CL 92* 105  CO2 28 25  GLUCOSE 109* 88  BUN 6 5*  CREATININE 0.64 0.54  CALCIUM 8.8* 8.2*  MG 1.3* 2.1   GFR: Estimated Creatinine Clearance: 55 mL/min (by C-G formula based on SCr of 0.54 mg/dL). Liver Function Tests: Recent Labs  Lab 06/01/17 1428  AST 35  ALT 25  ALKPHOS 132*  BILITOT 1.1  PROT 5.9*  ALBUMIN 2.7*   Recent Labs  Lab 06/01/17 1428  LIPASE 18   No results for input(s): AMMONIA in the last 168 hours. Coagulation Profile: No results for input(s): INR, PROTIME in the last 168 hours. Cardiac Enzymes: No results for input(s): CKTOTAL, CKMB, CKMBINDEX, TROPONINI in the last 168 hours. BNP (last 3 results) No results for input(s): PROBNP in the last 8760 hours. HbA1C: No results for input(s): HGBA1C in the last 72 hours. CBG: Recent Labs  Lab 06/01/17 1345  GLUCAP 120*   Lipid Profile: No  results for input(s): CHOL, HDL, LDLCALC, TRIG, CHOLHDL, LDLDIRECT in the last 72 hours. Thyroid Function Tests: Recent Labs    06/02/17 0620  TSH 42.084*   Anemia Panel: Recent Labs    06/02/17 0620  TIBC 238*  IRON 89   Sepsis Labs: No results for input(s): PROCALCITON, LATICACIDVEN in the last 168 hours.  No results found for this or any previous visit (from the past 240 hour(s)).       Radiology Studies: Dg Chest 2 View  Result Date: 06/01/2017 CLINICAL DATA:  pt was talking to a relative when she paused , drew arms inward and began to shake HTN, h/o multiple lung nodules (06/17/2013), h/o PNA, h/o throat cancer, former smoker x 6 years ago EXAM: CHEST  2 VIEW COMPARISON:  04/12/2017 FINDINGS: Normal cardiac silhouette. Scoliosis. Calcified granulomas scattered through both lungs unchanged. No effusion, infiltrate pneumothorax. IMPRESSION: 1.  No acute cardiopulmonary process. 2. Sigmoid scoliosis. Electronically Signed   By: Helane Gunther.D.  On: 06/01/2017 14:44   Ct Head Wo Contrast  Result Date: 06/01/2017 CLINICAL DATA:  Seizure. EXAM: CT HEAD WITHOUT CONTRAST TECHNIQUE: Contiguous axial images were obtained from the base of the skull through the vertex without intravenous contrast. COMPARISON:  None. FINDINGS: Brain: No evidence of acute infarction, hemorrhage, hydrocephalus, extra-axial collection or mass lesion/mass effect. Advanced for age brain parenchymal atrophy. Mild periventricular microangiopathy. Vascular: No hyperdense vessel or unexpected calcification. Skull: Normal. Negative for fracture or focal lesion. Sinuses/Orbits: Polypoid mucosal thickening of bilateral maxillary sinuses, bilateral ethmoid sinuses and less so frontal sinuses. Gas fluid level within the left sphenoid sinus. Osseous remodeling of the maxillary sinus walls, suggestive of chronic sinusitis. Other: None. IMPRESSION: No acute intracranial abnormality. Mild but advanced for age brain  parenchymal atrophy and chronic microvascular disease. Pansinusitis with evidence of chronic sinusitis of the maxillary sinuses. Electronically Signed   By: Fidela Salisbury M.D.   On: 06/01/2017 15:13        Scheduled Meds: . chlordiazePOXIDE  25 mg Oral QID   Followed by  . [START ON 06/03/2017] chlordiazePOXIDE  25 mg Oral TID   Followed by  . [START ON 06/04/2017] chlordiazePOXIDE  25 mg Oral BH-qamhs   Followed by  . [START ON 06/05/2017] chlordiazePOXIDE  25 mg Oral Daily  . enoxaparin (LOVENOX) injection  30 mg Subcutaneous Q24H  . feeding supplement  1 Container Oral TID BM  . folic acid  1 mg Oral Daily  . levothyroxine  50 mcg Oral QAC breakfast  . multivitamin with minerals  1 tablet Oral Daily  . nicotine  21 mg Transdermal Daily  . pantoprazole  40 mg Oral Daily  . potassium chloride  40 mEq Oral Q4H  . thiamine  100 mg Oral Daily   Or  . thiamine  100 mg Intravenous Daily   Continuous Infusions: . sodium chloride 100 mL/hr at 06/02/17 0303     LOS: 0 days    Time spent: 35 mins    Irine Seal, MD [facility] Pager [phone]  If 7PM-7AM, please contact night-coverage [email or web address] Password [facility] 06/02/2017, 12:38 PM

## 2017-06-02 NOTE — Consult Note (Addendum)
Neurology Consultation Reason for Consult: Seizure like activity Referring Physician: Rogene Houston  History is obtained from: Patient's cousin  HPI: Crystal Spence is a 42 y.o. female with history of alcohol abuse, laryngeal cancer, HTN, Hypothyroidism presents to ED after family witnessed "seizure like" activity.  Per her cousin at bedside who witnessed event, patient was sitting her chair. He saw her hands and legs spread out twitching, eyes rolled back and drooling from her mouth, but not violent jerking, tongue bite or urinary incontinence. He called 911 who teld them to layher on her back by which time she started becoming more responsive. By the time EMS arrived patient back to baseline.  She was brought to [facility], electrolytes showed K of 2.0.UDS was positive for opiates, but negative for benzodiazepines.  Urinalysis was negative for any signs of infection.  CT scan of the head showed no acute abnormalities and signs of pansinusitis with chronic sinusitis of the maxillary sinus. Also UPT shows elevated HCG.  Patient has no h/o of seizures, She drinks alcohol heavily ( refused to specify amount) and stated she has reduced her intake for last few days, she has not had any alcohol on Sunday and Etoh level was <10. She also was diagnosed with pneumonia recently and is on antibiotics.   She was afebrile, slightly tachycardic on arrival. Neurology was consulted for seizures.      ROS: A 14 point ROS was performed and is negative except as noted in the HPI  Past Medical History:  Diagnosis Date  . Anemia, unspecified 06/17/2013  . Headache   . History of laryngeal cancer 02/03/2013  . Hypertension   . Hypothyroidism (acquired) 06/17/2013  . Multiple lung nodules 06/28/2014  . Pneumonia 01/14/2014, 04/2017  . Poor oral hygiene 06/17/2013  . Rib pain 01/14/2014  . Rib pain on left side 01/05/2014  . Throat cancer [facility])      Family History  Problem Relation Age of Onset  . Cancer Maternal  Grandmother        skin cancer     Social History:  reports that she quit smoking about 6 years ago. She has a 15.00 pack-year smoking history. she has never used smokeless tobacco. She reports that she drinks alcohol. She reports that she does not use drugs.   Exam: Current vital signs: BP (!) 129/93 (BP Location: Right Arm)   Pulse 83   Temp 98 F (36.7 C) (Oral)   Resp 17   Ht 5\' 1"  (1.549 m)   Wt 38.4 kg (84 lb 10.5 oz)   LMP 10/20/2012 Comment: pt. has had tubal ligation,no longer has peroids  SpO2 100%   BMI 16.00 kg/m  Vital signs in last 24 hours: Temp:  [98 F (36.7 C)-98.7 F (37.1 C)] 98 F (36.7 C) (01/21 0256) Pulse Rate:  [83-101] 83 (01/21 0256) Resp:  [17-28] 17 (01/21 0256) BP: (124-144)/(90-101) 129/93 (01/21 0256) SpO2:  [98 %-100 %] 100 % (01/21 0256) Weight:  [38.4 kg (84 lb 10.5 oz)] 38.4 kg (84 lb 10.5 oz) (01/20 2038)   Physical Exam  Constitutional: Anorexic, thin,  Psych:irriatble,  Eyes: No scleral injection HENT: No OP obstrucion Head: Normocephalic.  Cardiovascular: Normal rate and regular rhythm.  Respiratory: Effort normal, non-labored breathing GI: Soft.  No distension. There is no tenderness.  Skin: WDI  Neuro: Mental Status: Patient is awake, alert, oriented to person, place, month, year. No signs of aphasia or neglect Cranial Nerves: II: Visual Fields are full. Pupils are equal, round,  and reactive to light.   III,IV, VI: EOMI without ptosis or diploplia.  V: Facial sensation is symmetric to temperature VII: Facial movement is symmetric.  VIII: hearing is intact to voice X: Uvula elevates symmetrically XI: Shoulder shrug is symmetric. XII: tongue is midline without atrophy or fasciculations.  Motor: Tone is normal. Bulk is normal. 5/5 strength was present in all four extremities. Sensory: Sensation is symmetric to light touch and temperature in the arms and legs. Plantars: Toes are downgoing bilaterally.   Cerebellar: FNF and HKS are intact bilaterally      I have reviewed labs in epic and the results pertinent to this consultation are: Are noted as above in HPI  I have reviewed the images obtained: CT Head shows no acute abnormality    ASSESSMENT AND PLAN   [name] is a 42 y.o. female with history of alcohol abuse, laryngeal cancer, HTN, Hypothyroidism presents to ED after family witnessed "seizure like" activity. Description of event did not seem classical for GCTS, could be partial seizure vs convulsive syncope from arrhythmia.  She has been seizure free since admission and back to baseline.   Seizure vs Convulsive syncope   CT head negative Correct electrolyte abnormalities Since its her first seizure, will not start her on AED. However, I do recommend Benzo taper for alcohol withdrawal. Benzo relatively safe Patient does not drive, no driving for atleast 6 months  Cardiac workup for arrhthymias   ? Pregancy Repeat HCG ordered Benzo relatively safe, but will need to discuss risk on doing benzo taper  She can follow up with outpatient neurologist for further seizure workup     Neurology will sign off. Thanks for the consult.

## 2017-06-02 NOTE — Progress Notes (Signed)
Initial Nutrition Assessment  DOCUMENTATION CODES:   Severe malnutrition in context of chronic illness  INTERVENTION:   Boost Breeze po TID, each supplement provides 250 kcal and 9 grams of protein  Magic cup daily with evening meal, each supplement provides 290 kcal and 9 grams of protein  Snack daily  Discontinue Ensure  NUTRITION DIAGNOSIS:   Severe Malnutrition related to chronic illness(hx of laryngeal cancer and alcohol abuse) as evidenced by severe muscle depletion, severe fat depletion.  GOAL:   Patient will meet greater than or equal to 90% of their needs  MONITOR:   PO intake, Supplement acceptance, Weight trends, I & O's  REASON FOR ASSESSMENT:   Low Braden    ASSESSMENT:   Pt with PMH of HTN, hx of laryngeal cancer, hypothyroidism, anemia, alcohol and tobacco abuse presents with seizure-like activity.    Spoke with pt and RN at bedside. Pt reports a decreased appetite over the past few days. Prior to this she was trying to consume 2 small meals and snacks (such as chips) daily. Sometimes intake varies. Pt did not admit to alcohol consumption at visit. Per meal completion records, pt consumed 50% of breakfast this morning. Lately pt has been getting SOB while eating/drinking.   Pt endorses weight loss, reporting a UBW of 95-96 lbs. Pt with 13% body weight loss in the past 10 months. Pt refusing Ensure, however is amenable to trying Boost Breeze and snacks throughout day.   Labs reviewed; CBG 120, Na 134, K 2.8, BUN 5, Albumin 2.7, Prealbumin 6.5, Hemoglobin 8.7 Medications reviewed; folic acid, multivitamin, Protonix, potassium chloride, thiamine  NUTRITION - FOCUSED PHYSICAL EXAM:  Nutrition-Focused physical exam completed. Findings are severe fat depletion, severe muscle depletion, and no edema.   Diet Order:  Seizure precautions Diet Heart Room service appropriate? Yes; Fluid consistency: Thin  EDUCATION NEEDS:   No education needs have been  identified at this time  Skin:  Skin Assessment: Reviewed RN Assessment  Last BM:  05/31/17  Height:   Ht Readings from Last 1 Encounters:  06/01/17 5\' 1"  (1.549 m)   Weight:   Wt Readings from Last 1 Encounters:  06/01/17 84 lb 10.5 oz (38.4 kg)   Ideal Body Weight:  47.7 kg  BMI:  Body mass index is 16 kg/m.  Estimated Nutritional Needs:   Kcal:  1200-1400  Protein:  60-70 grams  Fluid:  >/= 1.2 L/d  Parks Ranger, MS, RDN, LDN 06/02/2017 12:21 PM

## 2017-06-02 NOTE — Progress Notes (Signed)
LCSW consulted for SA.  Patient did not want to talk to LCSW.   Patient stated she did not need any resources for SA and that " she can do it on her own".   LCSW was not able to complete assessment with patient. Patient refused LCSW services.   Carolin Coy [place]

## 2017-06-03 DIAGNOSIS — D649 Anemia, unspecified: Secondary | ICD-10-CM

## 2017-06-03 DIAGNOSIS — J209 Acute bronchitis, unspecified: Secondary | ICD-10-CM

## 2017-06-03 LAB — BASIC METABOLIC PANEL
ANION GAP: 3 — AB (ref 5–15)
Anion gap: 3 — ABNORMAL LOW (ref 5–15)
BUN: <5 mg/dL — ABNORMAL LOW (ref 6–20)
CO2: 21 mmol/L — AB (ref 22–32)
CO2: 22 mmol/L (ref 22–32)
CREATININE: 0.51 mg/dL (ref 0.44–1.00)
Calcium: 8.2 mg/dL — ABNORMAL LOW (ref 8.9–10.3)
Calcium: 8.8 mg/dL — ABNORMAL LOW (ref 8.9–10.3)
Chloride: 114 mmol/L — ABNORMAL HIGH (ref 101–111)
Chloride: 111 mmol/L (ref 101–111)
Creatinine, Ser: 0.63 mg/dL (ref 0.44–1.00)
GFR calc Af Amer: >60 mL/min (ref >60)
GFR calc Af Amer: >60 mL/min (ref >60)
GFR calc non Af Amer: >60 mL/min (ref >60)
GLUCOSE: 75 mg/dL (ref 65–99)
GLUCOSE: 112 mg/dL — AB (ref 65–99)
POTASSIUM: 5.2 mmol/L — AB (ref 3.5–5.1)
Potassium: 5.3 mmol/L — ABNORMAL HIGH (ref 3.5–5.1)
Sodium: 138 mmol/L (ref 135–145)
Sodium: 136 mmol/L (ref 135–145)

## 2017-06-03 LAB — CBC
HCT: 31.3 % — ABNORMAL LOW (ref 36.0–46.0)
Hemoglobin: 9.7 g/dL — ABNORMAL LOW (ref 12.0–15.0)
MCH: 27.5 pg (ref 26.0–34.0)
MCHC: 31 g/dL (ref 30.0–36.0)
MCV: 88.7 fL (ref 78.0–100.0)
PLATELETS: 456 10*3/uL — AB (ref 150–400)
RBC: 3.53 MIL/uL — AB (ref 3.87–5.11)
RDW: 18.2 % — ABNORMAL HIGH (ref 11.5–15.5)
WBC: 4.5 10*3/uL (ref 4.0–10.5)

## 2017-06-03 LAB — MAGNESIUM: Magnesium: 1.5 mg/dL — ABNORMAL LOW (ref 1.7–2.4)

## 2017-06-03 MED ORDER — SODIUM CHLORIDE 0.9% FLUSH: 3.0000 mL | INTRAVENOUS | Status: DC | PRN

## 2017-06-03 MED ORDER — MAGNESIUM SULFATE 4 GM/100ML IV SOLN
4.0000 g | Freq: Once | INTRAVENOUS | Status: AC
  Administered 2017-06-03: 4 g via INTRAVENOUS
  Filled 2017-06-03: qty 100

## 2017-06-03 NOTE — Progress Notes (Signed)
PROGRESS NOTE    Crystal Spence  UQJ:[id number] DOB: Sep 30, 1975 DOA: 06/01/2017 PCP: Trey Sailors, PA   Brief Narrative:  Patient is a 42 year old female history of hypertension, hypothyroidism, anemia, history of laryngeal cancer, tracheocutaneous fistula status post closure, tobacco abuse, ongoing alcohol abuse who presented after having seizure-like activity.  CT head negative.  Potassium significantly low at less than 2.  Patient was noted to have a alcohol level of less than 10.  Neurology consulted.   Assessment & Plan:   Principal Problem:   Observed seizure-like activity (HCC) Active Problems:   Alcohol abuse   Anemia in chronic illness   Hypothyroidism (acquired)   Chronic anemia   Essential hypertension   Positive pregnancy test   Bronchitis, acute  #1 observe seizure-like activity Differential includes alcohol withdrawal seizures versus partial seizure.  Patient with no prior history of seizures.  Alcohol level was less than 10 on admission.  Patient with no further seizure activity noted.  CT head negative.  Replete electrolytes.  Discontinued Valium and started on the Librium detox protocol.  IV Ativan as needed.  Patient seen in consultation by neurology who feels patient does not need to be started on AED however does recommend benzo taper.  No driving for at least 6 months.  IV fluids.  Supportive care.  2.  Probable acute bronchitis Patient with rhonchorous breath sounds on admission.  Patient states was recently diagnosed and placed on antibiotics for probable pneumonia.  Patient cannot remember what antibiotic she was on.  CT head was concerning for chronic sinusitis.  Repeat chest x-ray negative for any acute infiltrate.  Patient with clinical improvement.  Continue current regimen of Augmentin, Pulmicort, Flonase, Claritin, scheduled duo nebs, PPI.  3.  Severe Hypokalemia Potassium level currently at 5.3.  Will remove potassium supplementation from IV  fluids.  Magnesium level is 1.5 and as such we will replete.    4.  Hypothyroidism TSH elevated at 42.084.  Patient states was not really compliant this past week with a thyroid medication.  Patient states thyroid medication was recently adjusted/changed per PCP 1-2 weeks prior to admission.  Continue home dose Synthroid.  Will likely need repeat thyroid function studies done in about 4-6 weeks.  Outpatient follow-up.  5.  Chronic anemia Follow H&H.  6.??  Positive pregnancy test Patient per RN has had her tubes tied.  We will repeat serum hCG in about 24-48 hours and if levels are not increasing then likely a biologic false positive.  If levels are increasing will formally consult with GYN/oncology.  Follow.  7.  Alcohol abuse Discontinued Valium. Continue Librium detox protocol.  IV Ativan as needed.  IV fluids, supportive care, thiamine, folic acid, multivitamin.  Consulted social work.    8 tobacco abuse Tobacco cessation.  Nicotine patch.   DVT prophylaxis: Lovenox Code Status: Full Family Communication: Updated patient and cousin at bedside. Disposition Plan: Home once medically stable, electrolytes repleted, completion of Librium detox protocol.   Consultants:   Neurology:Dr Aroor 06/02/2017  Procedures:   CT head 06/01/2017  Chest x-ray 06/02/2017, 06/01/2017  Antimicrobials: None   Subjective: Patient laying in bed.  No further seizures noted.  States cough is improving.  No chest pain.  No shortness of breath.   Objective: Vitals:   06/03/17 0024 06/03/17 0632 06/03/17 0756 06/03/17 0801  BP: 119/88 119/85    Pulse: 86 88    Resp: 20 16    Temp: 98 F (36.7 C) 98.3 F (  36.8 C)    TempSrc: Oral Oral    SpO2: 96% 98% 100% 100%  Weight:      Height:        Intake/Output Summary (Last 24 hours) at 06/03/2017 1022 Last data filed at 06/03/2017 0600 Gross per 24 hour  Intake 2213.33 ml  Output -  Net 2213.33 ml   Filed Weights   06/01/17 2038  Weight:  38.4 kg (84 lb 10.5 oz)    Examination:  General exam: Appears calm and comfortable  Respiratory system: Less Coarse rhonchorous diffuse breath sounds.  No wheezing.  No crackles.   Cardiovascular system: Regular rate and rhythm no murmurs rubs or gallops. No JVD, murmurs, rubs, gallops or clicks. No pedal edema. Gastrointestinal system: Abdomen is soft, nontender, nondistended, positive bowel sounds.  No hepatosplenomegaly. Central nervous system: Alert and oriented. No focal neurological deficits. Extremities: Symmetric 5 x 5 power. Skin: No rashes, lesions or ulcers Psychiatry: Judgement and insight appear normal. Mood & affect appropriate.     Data Reviewed: I have personally reviewed following labs and imaging studies  CBC: Recent Labs  Lab 06/01/17 1428 06/02/17 0620 06/03/17 0603  WBC 5.8 4.0 4.5  NEUTROABS 5.0  --   --   HGB 9.7* 8.7* 9.7*  HCT 30.1* 27.1* 31.3*  MCV 87.2 86.9 88.7  PLT 327 350 244*   Basic Metabolic Panel: Recent Labs  Lab 06/01/17 1428 06/02/17 0620 06/02/17 1508 06/03/17 0603  NA 131* 134* 136 138  K <2.0* 2.8* 2.7* 5.3*  CL 92* 105 106 114*  CO2 28 25 25  21*  GLUCOSE 109* 88 106* 75  BUN 6 5* 5* <5*  CREATININE 0.64 0.54 0.56 0.51  CALCIUM 8.8* 8.2* 8.0* 8.2*  MG 1.3* 2.1  --  1.5*   GFR: Estimated Creatinine Clearance: 55 mL/min (by C-G formula based on SCr of 0.51 mg/dL). Liver Function Tests: Recent Labs  Lab 06/01/17 1428  AST 35  ALT 25  ALKPHOS 132*  BILITOT 1.1  PROT 5.9*  ALBUMIN 2.7*   Recent Labs  Lab 06/01/17 1428  LIPASE 18   No results for input(s): AMMONIA in the last 168 hours. Coagulation Profile: No results for input(s): INR, PROTIME in the last 168 hours. Cardiac Enzymes: No results for input(s): CKTOTAL, CKMB, CKMBINDEX, TROPONINI in the last 168 hours. BNP (last 3 results) No results for input(s): PROBNP in the last 8760 hours. HbA1C: No results for input(s): HGBA1C in the last 72  hours. CBG: Recent Labs  Lab 06/01/17 1345  GLUCAP 120*   Lipid Profile: No results for input(s): CHOL, HDL, LDLCALC, TRIG, CHOLHDL, LDLDIRECT in the last 72 hours. Thyroid Function Tests: Recent Labs    06/02/17 0620  TSH 42.084*   Anemia Panel: Recent Labs    06/02/17 0620  TIBC 238*  IRON 89   Sepsis Labs: No results for input(s): PROCALCITON, LATICACIDVEN in the last 168 hours.  No results found for this or any previous visit (from the past 240 hour(s)).       Radiology Studies: Dg Chest 2 View  Result Date: 06/02/2017 CLINICAL DATA:  Cough and shortness of breath. EXAM: CHEST  2 VIEW COMPARISON:  06/01/2017 FINDINGS: The cardiomediastinal silhouette is unchanged with the heart being normal in size. Calcified granulomas are again seen in both lungs. Lung volumes are lower than on the prior study with minimal opacity in the left greater than right lung bases, likely atelectasis. Trace pleural effusions are questioned. No pneumothorax is identified. Thoracic  dextroscoliosis is again noted. IMPRESSION: Decreased lung volumes with minimal bibasilar opacity, likely atelectasis. Possible tiny pleural effusions. Electronically Signed   By: Logan Bores M.D.   On: 06/02/2017 15:51   Dg Chest 2 View  Result Date: 06/01/2017 CLINICAL DATA:  pt was talking to a relative when she paused , drew arms inward and began to shake HTN, h/o multiple lung nodules (06/17/2013), h/o PNA, h/o throat cancer, former smoker x 6 years ago EXAM: CHEST  2 VIEW COMPARISON:  04/12/2017 FINDINGS: Normal cardiac silhouette. Scoliosis. Calcified granulomas scattered through both lungs unchanged. No effusion, infiltrate pneumothorax. IMPRESSION: 1.  No acute cardiopulmonary process. 2. Sigmoid scoliosis. Electronically Signed   By: Suzy Bouchard M.D.   On: 06/01/2017 14:44   Ct Head Wo Contrast  Result Date: 06/01/2017 CLINICAL DATA:  Seizure. EXAM: CT HEAD WITHOUT CONTRAST TECHNIQUE: Contiguous axial  images were obtained from the base of the skull through the vertex without intravenous contrast. COMPARISON:  None. FINDINGS: Brain: No evidence of acute infarction, hemorrhage, hydrocephalus, extra-axial collection or mass lesion/mass effect. Advanced for age brain parenchymal atrophy. Mild periventricular microangiopathy. Vascular: No hyperdense vessel or unexpected calcification. Skull: Normal. Negative for fracture or focal lesion. Sinuses/Orbits: Polypoid mucosal thickening of bilateral maxillary sinuses, bilateral ethmoid sinuses and less so frontal sinuses. Gas fluid level within the left sphenoid sinus. Osseous remodeling of the maxillary sinus walls, suggestive of chronic sinusitis. Other: None. IMPRESSION: No acute intracranial abnormality. Mild but advanced for age brain parenchymal atrophy and chronic microvascular disease. Pansinusitis with evidence of chronic sinusitis of the maxillary sinuses. Electronically Signed   By: Fidela Salisbury M.D.   On: 06/01/2017 15:13        Scheduled Meds: . amoxicillin-clavulanate  1 tablet Oral Q12H  . budesonide (PULMICORT) nebulizer solution  0.5 mg Nebulization BID  . chlordiazePOXIDE  25 mg Oral TID   Followed by  . [START ON 06/04/2017] chlordiazePOXIDE  25 mg Oral BH-qamhs   Followed by  . [START ON 06/05/2017] chlordiazePOXIDE  25 mg Oral Daily  . enoxaparin (LOVENOX) injection  30 mg Subcutaneous Q24H  . feeding supplement  1 Container Oral TID BM  . fluticasone  2 spray Each Nare Daily  . folic acid  1 mg Oral Daily  . guaiFENesin  1,200 mg Oral BID  . ipratropium-albuterol  3 mL Nebulization TID  . levothyroxine  50 mcg Oral QAC breakfast  . loratadine  10 mg Oral Daily  . multivitamin with minerals  1 tablet Oral Daily  . nicotine  21 mg Transdermal Daily  . pantoprazole  40 mg Oral Daily  . polyethylene glycol  17 g Oral BID  . senna-docusate  1 tablet Oral QHS  . thiamine  100 mg Oral Daily   Or  . thiamine  100 mg  Intravenous Daily   Continuous Infusions: . magnesium sulfate 1 - 4 g bolus IVPB 4 g (06/03/17 0920)     LOS: 1 day    Time spent: 39 mins    Irine Seal, MD [facility] Pager [phone]  If 7PM-7AM, please contact night-coverage [email or web address] Password [facility] 06/03/2017, 10:22 AM

## 2017-06-04 LAB — BASIC METABOLIC PANEL
Anion gap: 2 — ABNORMAL LOW (ref 5–15)
CO2: 22 mmol/L (ref 22–32)
Calcium: 8.1 mg/dL — ABNORMAL LOW (ref 8.9–10.3)
Chloride: 108 mmol/L (ref 101–111)
Creatinine, Ser: 0.65 mg/dL (ref 0.44–1.00)
GFR calc Af Amer: >60 mL/min (ref >60)
GLUCOSE: 94 mg/dL (ref 65–99)
Potassium: 4.2 mmol/L (ref 3.5–5.1)
Sodium: 132 mmol/L — ABNORMAL LOW (ref 135–145)

## 2017-06-04 LAB — CBC
HEMATOCRIT: 29.2 % — AB (ref 36.0–46.0)
Hemoglobin: 9.3 g/dL — ABNORMAL LOW (ref 12.0–15.0)
MCH: 28.1 pg (ref 26.0–34.0)
MCHC: 31.8 g/dL (ref 30.0–36.0)
MCV: 88.2 fL (ref 78.0–100.0)
PLATELETS: 413 10*3/uL — AB (ref 150–400)
RBC: 3.31 MIL/uL — ABNORMAL LOW (ref 3.87–5.11)
RDW: 18.6 % — AB (ref 11.5–15.5)
WBC: 6.1 10*3/uL (ref 4.0–10.5)

## 2017-06-04 LAB — HCG, QUANTITATIVE, PREGNANCY: hCG, Beta Chain, Quant, S: 5 m[IU]/mL — ABNORMAL HIGH (ref <5)

## 2017-06-04 LAB — MAGNESIUM: Magnesium: 1.9 mg/dL (ref 1.7–2.4)

## 2017-06-04 MED ORDER — IPRATROPIUM-ALBUTEROL 0.5-2.5 (3) MG/3ML IN SOLN
3.0000 mL | Freq: Two times a day (BID) | RESPIRATORY_TRACT | Status: DC
  Administered 2017-06-04: 3 mL via RESPIRATORY_TRACT
  Filled 2017-06-04 (×2): qty 3

## 2017-06-04 MED ORDER — MAGNESIUM SULFATE 2 GM/50ML IV SOLN
2.0000 g | Freq: Once | INTRAVENOUS | Status: AC
  Administered 2017-06-04: 2 g via INTRAVENOUS
  Filled 2017-06-04: qty 50

## 2017-06-04 NOTE — Progress Notes (Signed)
PT demonstrated hands on understanding of Flutter device. 

## 2017-06-04 NOTE — Progress Notes (Signed)
PROGRESS NOTE    Crystal Spence  SWF:[id number] DOB: 08-12-1975 DOA: 06/01/2017 PCP: Trey Sailors, PA   Brief Narrative:  Patient is a 42 year old female history of hypertension, hypothyroidism, anemia, history of laryngeal cancer, tracheocutaneous fistula status post closure, tobacco abuse, ongoing alcohol abuse who presented after having seizure-like activity.  CT head negative.  Potassium significantly low at less than 2.  Patient was noted to have a alcohol level of less than 10.  Neurology consulted.   Assessment & Plan:   Principal Problem:   Observed seizure-like activity (HCC) Active Problems:   Alcohol abuse   Anemia in chronic illness   Hypothyroidism (acquired)   Chronic anemia   Essential hypertension   Positive pregnancy test   Bronchitis, acute  #1 observe seizure-like activity Differential includes alcohol withdrawal seizures versus partial seizure.  Patient with no prior history of seizures.  Alcohol level was less than 10 on admission.  Patient with no further seizure activity noted the hospitalization.  CT head negative.  Replete electrolytes.  Discontinued Valium and started on the Librium detox protocol.  IV Ativan as needed.  Patient seen in consultation by neurology who feels patient does not need to be started on AED however does recommend benzo taper.  No driving for at least 6 months.  IV fluids.  Supportive care.  2.  Probable acute bronchitis Patient with rhonchorous breath sounds on admission.  Patient states was recently diagnosed and placed on antibiotics for probable pneumonia.  Patient cannot remember what antibiotic she was on.  CT head was concerning for chronic sinusitis.  Repeat chest x-ray negative for any acute infiltrate.  Patient with clinical improvement.  Continue current regimen of Augmentin, Pulmicort, Flonase, Claritin, scheduled duo nebs, PPI.  3.  Severe Hypokalemia Potassium level currently at 4.2.  Potassium supplementation was  removed from IV fluids.  Magnesium level at 1.9.  Follow.    4.  Hypothyroidism TSH elevated at 42.084.  Patient states was not really compliant this past week with a thyroid medication.  Patient states thyroid medication was recently adjusted/changed per PCP 1-2 weeks prior to admission.  Continue home dose Synthroid.  Will likely need repeat thyroid function studies done in about 4-6 weeks.  Outpatient follow-up.  5.  Chronic anemia Follow H&H.  6.??  Positive pregnancy test Patient per RN has had her tubes tied.  We will repeat serum hCG today, and if levels are not increasing then likely a biologic false positive.  If levels are increasing will formally consult with GYN/oncology.  Follow.  7.  Alcohol abuse Discontinued Valium.  No DTs noted.  Continue Librium detox protocol.  IV Ativan as needed.  IV fluids, supportive care, thiamine, folic acid, multivitamin.  Consulted social work however patient refused information from social work.    8 tobacco abuse Tobacco cessation.  Nicotine patch.   DVT prophylaxis: Lovenox Code Status: Full Family Communication: Updated patient and cousin at bedside. Disposition Plan: Home once medically stable, electrolytes repleted, completion of Librium detox protocol, tomorrow.   Consultants:   Neurology:Dr Aroor 06/02/2017  Procedures:   CT head 06/01/2017  Chest x-ray 06/02/2017, 06/01/2017  Antimicrobials: Augmentin 06/02/2017   Subjective: Patient in bed.  No further seizures noted per RN.  Patient denies any chest pain or shortness of breath.  Patient asking when she can go home.    Objective: Vitals:   06/03/17 2054 06/04/17 0355 06/04/17 0839 06/04/17 0948  BP:  106/77  98/65  Pulse:  84  92  Resp:  16  20  Temp:  98.2 F (36.8 C)  98.1 F (36.7 C)  TempSrc:  Oral  Oral  SpO2: 98% 100% 97% 95%  Weight:      Height:        Intake/Output Summary (Last 24 hours) at 06/04/2017 1237 Last data filed at 06/04/2017 0902 Gross per  24 hour  Intake 480 ml  Output 0 ml  Net 480 ml   Filed Weights   06/01/17 2038  Weight: 38.4 kg (84 lb 10.5 oz)    Examination:  General exam: NAD Respiratory system: Less rhonchorous diffuse breath sounds.  No wheezing.  No crackles.  Cardiovascular system: Regular rate and rhythm no murmurs rubs or gallops.  No lower extremity edema.  No JVD. Gastrointestinal system: Abdomen is tender, nondistended positive bowel sounds, soft, no rebound, no guarding.  Central nervous system: Alert and oriented. No focal neurological deficits. Extremities: Symmetric 5 x 5 power. Skin: No rashes, lesions or ulcers Psychiatry: Judgement and insight appear normal. Mood & affect appropriate.     Data Reviewed: I have personally reviewed following labs and imaging studies  CBC: Recent Labs  Lab 06/01/17 1428 06/02/17 0620 06/03/17 0603 06/04/17 0622  WBC 5.8 4.0 4.5 6.1  NEUTROABS 5.0  --   --   --   HGB 9.7* 8.7* 9.7* 9.3*  HCT 30.1* 27.1* 31.3* 29.2*  MCV 87.2 86.9 88.7 88.2  PLT 327 350 456* 269*   Basic Metabolic Panel: Recent Labs  Lab 06/01/17 1428 06/02/17 0620 06/02/17 1508 06/03/17 0603 06/03/17 1407 06/04/17 0622  NA 131* 134* 136 138 136 132*  K <2.0* 2.8* 2.7* 5.3* 5.2* 4.2  CL 92* 105 106 114* 111 108  CO2 28 25 25  21* 22 22  GLUCOSE 109* 88 106* 75 112* 94  BUN 6 5* 5* <5* <5* <5*  CREATININE 0.64 0.54 0.56 0.51 0.63 0.65  CALCIUM 8.8* 8.2* 8.0* 8.2* 8.8* 8.1*  MG 1.3* 2.1  --  1.5*  --  1.9   GFR: Estimated Creatinine Clearance: 55 mL/min (by C-G formula based on SCr of 0.65 mg/dL). Liver Function Tests: Recent Labs  Lab 06/01/17 1428  AST 35  ALT 25  ALKPHOS 132*  BILITOT 1.1  PROT 5.9*  ALBUMIN 2.7*   Recent Labs  Lab 06/01/17 1428  LIPASE 18   No results for input(s): AMMONIA in the last 168 hours. Coagulation Profile: No results for input(s): INR, PROTIME in the last 168 hours. Cardiac Enzymes: No results for input(s): CKTOTAL, CKMB,  CKMBINDEX, TROPONINI in the last 168 hours. BNP (last 3 results) No results for input(s): PROBNP in the last 8760 hours. HbA1C: No results for input(s): HGBA1C in the last 72 hours. CBG: Recent Labs  Lab 06/01/17 1345  GLUCAP 120*   Lipid Profile: No results for input(s): CHOL, HDL, LDLCALC, TRIG, CHOLHDL, LDLDIRECT in the last 72 hours. Thyroid Function Tests: Recent Labs    06/02/17 0620  TSH 42.084*   Anemia Panel: Recent Labs    06/02/17 0620  TIBC 238*  IRON 89   Sepsis Labs: No results for input(s): PROCALCITON, LATICACIDVEN in the last 168 hours.  No results found for this or any previous visit (from the past 240 hour(s)).       Radiology Studies: Dg Chest 2 View  Result Date: 06/02/2017 CLINICAL DATA:  Cough and shortness of breath. EXAM: CHEST  2 VIEW COMPARISON:  06/01/2017 FINDINGS: The cardiomediastinal silhouette is unchanged with the heart  being normal in size. Calcified granulomas are again seen in both lungs. Lung volumes are lower than on the prior study with minimal opacity in the left greater than right lung bases, likely atelectasis. Trace pleural effusions are questioned. No pneumothorax is identified. Thoracic dextroscoliosis is again noted. IMPRESSION: Decreased lung volumes with minimal bibasilar opacity, likely atelectasis. Possible tiny pleural effusions. Electronically Signed   By: Logan Bores M.D.   On: 06/02/2017 15:51        Scheduled Meds: . amoxicillin-clavulanate  1 tablet Oral Q12H  . budesonide (PULMICORT) nebulizer solution  0.5 mg Nebulization BID  . chlordiazePOXIDE  25 mg Oral BH-qamhs   Followed by  . [START ON 06/05/2017] chlordiazePOXIDE  25 mg Oral Daily  . enoxaparin (LOVENOX) injection  30 mg Subcutaneous Q24H  . feeding supplement  1 Container Oral TID BM  . fluticasone  2 spray Each Nare Daily  . folic acid  1 mg Oral Daily  . guaiFENesin  1,200 mg Oral BID  . ipratropium-albuterol  3 mL Nebulization BID  .  levothyroxine  50 mcg Oral QAC breakfast  . loratadine  10 mg Oral Daily  . multivitamin with minerals  1 tablet Oral Daily  . nicotine  21 mg Transdermal Daily  . pantoprazole  40 mg Oral Daily  . polyethylene glycol  17 g Oral BID  . senna-docusate  1 tablet Oral QHS  . thiamine  100 mg Oral Daily   Or  . thiamine  100 mg Intravenous Daily   Continuous Infusions:    LOS: 2 days    Time spent: 35 mins    Irine Seal, MD [facility] Pager [phone]  If 7PM-7AM, please contact night-coverage [email or web address] Password [facility] 06/04/2017, 12:37 PM

## 2017-06-05 ENCOUNTER — Inpatient Hospital Stay (HOSPITAL_COMMUNITY): Payer: Medicaid Other

## 2017-06-05 DIAGNOSIS — M7989 Other specified soft tissue disorders: Secondary | ICD-10-CM

## 2017-06-05 DIAGNOSIS — R569 Unspecified convulsions: Secondary | ICD-10-CM

## 2017-06-05 LAB — CBC
HCT: 27.7 % — ABNORMAL LOW (ref 36.0–46.0)
Hemoglobin: 8.8 g/dL — ABNORMAL LOW (ref 12.0–15.0)
MCH: 27.9 pg (ref 26.0–34.0)
MCHC: 31.8 g/dL (ref 30.0–36.0)
MCV: 87.9 fL (ref 78.0–100.0)
PLATELETS: 453 10*3/uL — AB (ref 150–400)
RBC: 3.15 MIL/uL — AB (ref 3.87–5.11)
RDW: 18.8 % — AB (ref 11.5–15.5)
WBC: 5.2 10*3/uL (ref 4.0–10.5)

## 2017-06-05 LAB — BASIC METABOLIC PANEL
ANION GAP: 6 (ref 5–15)
BUN: 6 mg/dL (ref 6–20)
CALCIUM: 8.3 mg/dL — AB (ref 8.9–10.3)
CO2: 22 mmol/L (ref 22–32)
Chloride: 107 mmol/L (ref 101–111)
Creatinine, Ser: 0.53 mg/dL (ref 0.44–1.00)
GFR calc Af Amer: >60 mL/min (ref >60)
GLUCOSE: 82 mg/dL (ref 65–99)
POTASSIUM: 3.7 mmol/L (ref 3.5–5.1)
SODIUM: 135 mmol/L (ref 135–145)

## 2017-06-05 LAB — MAGNESIUM: Magnesium: 1.8 mg/dL (ref 1.7–2.4)

## 2017-06-05 MED ORDER — FLUTICASONE PROPIONATE 50 MCG/ACT NA SUSP: 2.0000 | Freq: Every day | NASAL | 0 refills | Status: DC

## 2017-06-05 MED ORDER — ADULT MULTIVITAMIN W/MINERALS CH: 1.0000 | ORAL_TABLET | Freq: Every day | ORAL | Status: DC

## 2017-06-05 MED ORDER — NICOTINE 21 MG/24HR TD PT24: 21.0000 mg | MEDICATED_PATCH | Freq: Every day | TRANSDERMAL | 0 refills | Status: DC

## 2017-06-05 MED ORDER — GUAIFENESIN ER 600 MG PO TB12: 1200.0000 mg | ORAL_TABLET | Freq: Two times a day (BID) | ORAL | 0 refills | Status: AC

## 2017-06-05 MED ORDER — MAGNESIUM SULFATE 4 GM/100ML IV SOLN
4.0000 g | Freq: Once | INTRAVENOUS | Status: AC
  Administered 2017-06-05: 4 g via INTRAVENOUS
  Filled 2017-06-05: qty 100

## 2017-06-05 MED ORDER — THIAMINE HCL 100 MG PO TABS: 100.0000 mg | ORAL_TABLET | Freq: Every day | ORAL | Status: DC

## 2017-06-05 MED ORDER — POLYETHYLENE GLYCOL 3350 17 G PO PACK: 17.0000 g | PACK | Freq: Two times a day (BID) | ORAL | 0 refills | Status: DC

## 2017-06-05 MED ORDER — PANTOPRAZOLE SODIUM 40 MG PO TBEC: 40.0000 mg | DELAYED_RELEASE_TABLET | Freq: Every day | ORAL | 0 refills | Status: DC

## 2017-06-05 MED ORDER — HYDROCODONE-HOMATROPINE 5-1.5 MG/5ML PO SYRP: 5.0000 mL | ORAL_SOLUTION | Freq: Four times a day (QID) | ORAL | 0 refills | Status: DC | PRN

## 2017-06-05 MED ORDER — SENNOSIDES-DOCUSATE SODIUM 8.6-50 MG PO TABS: 1.0000 | ORAL_TABLET | Freq: Every day | ORAL | Status: DC

## 2017-06-05 MED ORDER — FOLIC ACID 1 MG PO TABS: 1.0000 mg | ORAL_TABLET | Freq: Every day | ORAL | Status: DC

## 2017-06-05 MED ORDER — AMOXICILLIN-POT CLAVULANATE 875-125 MG PO TABS: 1.0000 | ORAL_TABLET | Freq: Two times a day (BID) | ORAL | 0 refills | Status: AC

## 2017-06-05 NOTE — Progress Notes (Signed)
Left upper extremity venous duplex has been completed. Negative for DVT. Results were given to the patient's nurse, [place].  06/05/17 11:57 AM Carlos Levering RVT

## 2017-06-05 NOTE — Discharge Summary (Addendum)
Physician Discharge Summary  Crystal Spence CBU:[id number] DOB: 11-27-1975 DOA: 06/01/2017  PCP: Crystal Sailors, PA  Admit date: 06/01/2017 Discharge date: 06/05/2017  Time spent: 65 minutes  Recommendations for Outpatient Follow-up:  1. Follow-up with Crystal Sailors, PA in 1-2 weeks.  On follow-up patient's alcohol abuse and need to be reassessed.  Patient will need a basic metabolic profile done to follow-up on electrolytes and renal function.  Patient will need a magnesium level checked.  Patient also need thyroid function studies done in about 4-6 weeks. 2. Follow-up with Crystal Spence neurology in 3-4 weeks for further evaluation of seizures.   Discharge Diagnoses:  Principal Problem:   Observed seizure-like activity ([place]) Active Problems:   Alcohol abuse   Anemia in chronic illness   Hypothyroidism (acquired)   Chronic anemia   Essential hypertension   Positive pregnancy test   Bronchitis, acute   Seizure ([place])   Discharge Condition: Stable and improved.  Diet recommendation: Regular  Filed Weights   06/01/17 2038  Weight: 38.4 kg (84 lb 10.5 oz)    History of present illness:  Per Dr Terance Hart [name] is a 42 y.o. female with medical history significant of HTN, hypothyroidism, anemia, H/O laryngeal cancer, tracheocutaneous fistula s/p closure tobacco abuse, and alcohol abuse; who presented after having what was reported as seizure-like activity.  Much of history was obtained from review of records and discussions with family members.  Patient had been talking to her relative when she falls drew arms inward and began to shake.  EMS was called immediately and patient was noted to be in a postictal state following some commands.  Initial blood sugar was 139 and patient was found to be slightly tachycardic at 104.  Family noted that she had been recently sick with pneumonia.To family's knowledge she has never had seizures before.  however, they note that she drinks  vodka heavily whenever she can get it.  She still currently smokes tobacco.  Patient noted that she had had some vomiting over the last few days.  ED Course: Upon admission into the emergency department patient was seen to be afebrile, pulse 86-101, respirations 17-28, and all other vital signs maintained.  Labs revealed WBC 5.8, hemoglobin 9.7, sodium 131, potassium<2, albumin 2.8, AST 48, ALT 23, alcohol level <10, i-STAT Hgb 13.9.  UDS was positive for opiates, but negative for benzodiazepines.  Urinalysis was negative for any signs of infection.  CT scan of the head showed no acute abnormalities and signs of pansinusitis with chronic sinusitis of the maxillary sinus.  Patient was given 40 mEq of potassium chloride orally and 50 mEq IV and placed on normal saline at 100 mL/h.  Neurology was consulted and recommended patient be placed on Valium 3 times daily.    Hospital Course:  #1 observe seizure-like activity Differential includes alcohol withdrawal seizures versus partial seizure.  Patient with no prior history of seizures.  Alcohol level was less than 10 on admission.  Patient with no further seizure activity noted the hospitalization.  CT head negative.  She was noted to have some severe hypokalemia and electrolytes repleted.  Patient was seen in consultation by neurology who had initially recommended scheduled Valium taper which was started.  Patient was also placed on the Ativan withdrawal protocol.  It was felt patient's seizures were secondary to alcohol withdrawal seizures.  Patient was placed on seizure precautions and monitored.  IV Ativan withdrawal protocol was discontinued and patient was placed on the Librium  detox protocol.  Patient was seen in consultation by neurology who feels patient does not need to be started on AED however did recommend benzo taper.  Patient improved clinically during the hospitalization and patient will be discharged in stable and improved condition patient did  not have any further seizure episodes.  Patient was instructed no driving until seizure-free for at least 6 months, caution when using heavy equipment or power tools.  Avoid working on ladders or heights.  No diving.  No swimming alone.  Ensure water temperature not too high on home water heater.  When caring for infants or small children sit down when holding, feeding or changing them to minimize risk of injury to child in the event you have a seizure.  Patient remained seizure-free for the rest of the hospitalization with discharge in stable and improved condition.  Patient will follow-up with neurology in the outpatient setting.   2.  Probable acute bronchitis Patient with rhonchorous breath sounds on admission.  Patient states was recently diagnosed and placed on antibiotics for probable pneumonia.  Patient couldnot remember what antibiotic she was on.  CT head was concerning for chronic sinusitis.  Repeat chest x-ray negative for any acute infiltrate.  Patient was placed on Augmentin, Pulmicort, Flonase, Claritin, scheduled duo nebs, PPI.  Patient improved clinically and patient will be discharged on 2 more days of oral Augmentin to complete a 5-day course of antibiotic treatment.  Outpatient follow-up.  3.  Severe Hypokalemia Patient on admission was noted to have a severe hypokalemia with potassium level is less than 2.  Patient's hypokalemia was aggressively repleted such that by day of discharge potassium level was at 3.7.  Outpatient follow-up.   4.  Hypothyroidism TSH elevated at 42.084.  Patient stated was not really compliant this past week with a thyroid medication.  Patient states thyroid medication was recently adjusted/changed per PCP 1-2 weeks prior to admission.    Was subsequently maintained on home regimen of Synthroid.  Will likely need repeat thyroid function studies done in about 4-6 weeks.  Outpatient follow-up.  5.  Chronic anemia Hemoglobin remained stable.   6.??   Positive pregnancy test Patient per RN has had her tubes tied.    Was noted to have a serum hCG level of 13 on admission.  Repeat serum hCG levels were obtained 48 hours after initial test was obtained and came back at 5.  It was felt initially elevated hCG levels were likely a biologic false positive. Patient is not pregnant.  7.  Alcohol abuse On admission patient was placed on the Ativan withdrawal protocol.  Patient was also placed on scheduled failure.  Scheduled Valium was discontinued as well as the Ativan withdrawal protocol.  Patient was placed on the Librium detox protocol as well as IV Ativan as needed.  Patient was also maintained on IV fluids, supportive care, thiamine, folic acid, multivitamin.  Should improve clinically.  Patient had no further seizures noted.  Patient did not have any DTs.  Patient will be discharged in stable and improved condition. Consulted social work however patient refused information from social work.    8 tobacco abuse Tobacco cessation.  She was placed on a Nicotine patch.  9. left upper extremity swelling Patient was noted to have some left upper extremity swelling on day of discharge.  Doppler ultrasound obtained was negative for DVT.  Outpatient follow-up with PCP.      Procedures:  CT head 06/01/2017  Chest x-ray 06/02/2017, 06/01/2017  Left  upper extremity Dopplers negative for DVT.      Consultations:  Neurology:Dr Aroor 06/02/2017      Discharge Exam: Vitals:   06/05/17 1012 06/05/17 1300  BP:  99/70  Pulse: 100 99  Resp: 18 (!) 30  Temp:  98.1 F (36.7 C)  SpO2: 99% 99%    General: NAD Cardiovascular: RRR Respiratory: CTAB  Discharge Instructions   Discharge Instructions    Diet general   Complete by:  As directed    Discharge instructions   Complete by:  As directed    No driving until seizure-free for 6 months.  Use caution when using heavy equipment or power tools.  Avoid working on ladders or heights.   No diving.  No swimming alone.  Ensure water temperature not too high on home water heater.  When caring for infants of small children sit down when holding, feeding or changing them to minimize risk of injury to child in the event that you have a seizure.   Increase activity slowly   Complete by:  As directed      Allergies as of 06/05/2017      Reactions   No Known Allergies       Medication List    STOP taking these medications   CARTIA XT 240 MG 24 hr capsule Generic drug:  diltiazem   losartan 25 MG tablet Commonly known as:  COZAAR   potassium chloride SA 20 MEQ tablet Commonly known as:  K-DUR,KLOR-CON     TAKE these medications   albuterol 108 (90 Base) MCG/ACT inhaler Commonly known as:  PROVENTIL HFA;VENTOLIN HFA Inhale 2 puffs into the lungs every 2 (two) hours as needed for wheezing or shortness of breath (cough).   amoxicillin-clavulanate 875-125 MG tablet Commonly known as:  AUGMENTIN Take 1 tablet by mouth every 12 (twelve) hours for 2 days.   butalbital-acetaminophen-caffeine 50-325-40 MG tablet Commonly known as:  FIORICET, ESGIC Take 1 tablet by mouth 3 (three) times daily as needed for headache or migraine.   clonazePAM 0.5 MG tablet Commonly known as:  KLONOPIN Take 0.5 mg by mouth daily as needed for anxiety.   fluticasone 50 MCG/ACT nasal spray Commonly known as:  FLONASE Place 2 sprays into both nostrils daily. Start taking on:  1/93/7902   folic acid 1 MG tablet Commonly known as:  FOLVITE Take 1 tablet (1 mg total) by mouth daily. Start taking on:  06/06/2017   guaiFENesin 600 MG 12 hr tablet Commonly known as:  MUCINEX Take 2 tablets (1,200 mg total) by mouth 2 (two) times daily for 3 days.   HYDROcodone-homatropine 5-1.5 MG/5ML syrup Commonly known as:  HYCODAN Take 5 mLs by mouth every 6 (six) hours as needed for cough.   levothyroxine 50 MCG tablet Commonly known as:  SYNTHROID, LEVOTHROID Take 50 mcg by mouth daily before  breakfast.   montelukast 10 MG tablet Commonly known as:  SINGULAIR TAKE 1 TABLET BY MOUTH AT BEDTIME   multivitamin with minerals Tabs tablet Take 1 tablet by mouth daily. Start taking on:  06/06/2017   nicotine 21 mg/24hr patch Commonly known as:  NICODERM CQ - dosed in mg/24 hours Place 1 patch (21 mg total) onto the skin daily. Start taking on:  06/06/2017   pantoprazole 40 MG tablet Commonly known as:  PROTONIX Take 1 tablet (40 mg total) by mouth daily. Start taking on:  06/06/2017   polyethylene glycol packet Commonly known as:  MIRALAX / GLYCOLAX Take 17 g by mouth  2 (two) times daily.   senna-docusate 8.6-50 MG tablet Commonly known as:  Senokot-S Take 1 tablet by mouth at bedtime.   thiamine 100 MG tablet Take 1 tablet (100 mg total) by mouth daily. Start taking on:  06/06/2017   topiramate 50 MG tablet Commonly known as:  TOPAMAX Take 100 mg by mouth 2 (two) times daily.   traZODone 100 MG tablet Commonly known as:  DESYREL Take 100 mg by mouth at bedtime.      Allergies  Allergen Reactions  . No Known Allergies    Follow-up Information    Crystal Spence, [place]. Schedule an appointment as soon as possible for a visit in 1 week(s).   Specialty:  Physician Assistant Why:  f/u in 1-2 weeks. Contact information: [place] [id number] [phone]        [place]. Schedule an appointment as soon as possible for a visit in 3 week(s).   Why:  f/u in 3-4 weeks, for further seizure work up. Contact information: [place], [place] [phone]           The results of significant diagnostics from this hospitalization (including imaging, microbiology, ancillary and laboratory) are listed below for reference.    Significant Diagnostic Studies: Dg Chest 2 View  Result Date: 06/02/2017 CLINICAL DATA:  Cough and shortness of breath. EXAM: CHEST  2 VIEW COMPARISON:  06/01/2017  FINDINGS: The cardiomediastinal silhouette is unchanged with the heart being normal in size. Calcified granulomas are again seen in both lungs. Lung volumes are lower than on the prior study with minimal opacity in the left greater than right lung bases, likely atelectasis. Trace pleural effusions are questioned. No pneumothorax is identified. Thoracic dextroscoliosis is again noted. IMPRESSION: Decreased lung volumes with minimal bibasilar opacity, likely atelectasis. Possible tiny pleural effusions. Electronically Signed   By: Logan Bores M.D.   On: 06/02/2017 15:51   Dg Chest 2 View  Result Date: 06/01/2017 CLINICAL DATA:  pt was talking to a relative when she paused , drew arms inward and began to shake HTN, h/o multiple lung nodules (06/17/2013), h/o PNA, h/o throat cancer, former smoker x 6 years ago EXAM: CHEST  2 VIEW COMPARISON:  04/12/2017 FINDINGS: Normal cardiac silhouette. Scoliosis. Calcified granulomas scattered through both lungs unchanged. No effusion, infiltrate pneumothorax. IMPRESSION: 1.  No acute cardiopulmonary process. 2. Sigmoid scoliosis. Electronically Signed   By: Suzy Bouchard M.D.   On: 06/01/2017 14:44   Ct Head Wo Contrast  Result Date: 06/01/2017 CLINICAL DATA:  Seizure. EXAM: CT HEAD WITHOUT CONTRAST TECHNIQUE: Contiguous axial images were obtained from the base of the skull through the vertex without intravenous contrast. COMPARISON:  None. FINDINGS: Brain: No evidence of acute infarction, hemorrhage, hydrocephalus, extra-axial collection or mass lesion/mass effect. Advanced for age brain parenchymal atrophy. Mild periventricular microangiopathy. Vascular: No hyperdense vessel or unexpected calcification. Skull: Normal. Negative for fracture or focal lesion. Sinuses/Orbits: Polypoid mucosal thickening of bilateral maxillary sinuses, bilateral ethmoid sinuses and less so frontal sinuses. Gas fluid level within the left sphenoid sinus. Osseous remodeling of the maxillary  sinus walls, suggestive of chronic sinusitis. Other: None. IMPRESSION: No acute intracranial abnormality. Mild but advanced for age brain parenchymal atrophy and chronic microvascular disease. Pansinusitis with evidence of chronic sinusitis of the maxillary sinuses. Electronically Signed   By: Fidela Salisbury M.D.   On: 06/01/2017 15:13    Microbiology: No results found for this or any previous visit (  from the past 240 hour(s)).   Labs: Basic Metabolic Panel: Recent Labs  Lab 06/01/17 1428 06/02/17 0620 06/02/17 1508 06/03/17 0603 06/03/17 1407 06/04/17 0622 06/05/17 0535  NA 131* 134* 136 138 136 132* 135  K <2.0* 2.8* 2.7* 5.3* 5.2* 4.2 3.7  CL 92* 105 106 114* 111 108 107  CO2 28 25 25  21* 22 22 22   GLUCOSE 109* 88 106* 75 112* 94 82  BUN 6 5* 5* <5* <5* <5* 6  CREATININE 0.64 0.54 0.56 0.51 0.63 0.65 0.53  CALCIUM 8.8* 8.2* 8.0* 8.2* 8.8* 8.1* 8.3*  MG 1.3* 2.1  --  1.5*  --  1.9 1.8   Liver Function Tests: Recent Labs  Lab 06/01/17 1428  AST 35  ALT 25  ALKPHOS 132*  BILITOT 1.1  PROT 5.9*  ALBUMIN 2.7*   Recent Labs  Lab 06/01/17 1428  LIPASE 18   No results for input(s): AMMONIA in the last 168 hours. CBC: Recent Labs  Lab 06/01/17 1428 06/02/17 0620 06/03/17 0603 06/04/17 0622 06/05/17 0535  WBC 5.8 4.0 4.5 6.1 5.2  NEUTROABS 5.0  --   --   --   --   HGB 9.7* 8.7* 9.7* 9.3* 8.8*  HCT 30.1* 27.1* 31.3* 29.2* 27.7*  MCV 87.2 86.9 88.7 88.2 87.9  PLT 327 350 456* 413* 453*   Cardiac Enzymes: No results for input(s): CKTOTAL, CKMB, CKMBINDEX, TROPONINI in the last 168 hours. BNP: BNP (last 3 results) No results for input(s): BNP in the last 8760 hours.  ProBNP (last 3 results) No results for input(s): PROBNP in the last 8760 hours.  CBG: Recent Labs  Lab 06/01/17 1345  GLUCAP 120*       Signed:  Irine Seal MD.  [facility] 06/05/2017, 2:06 PM

## 2017-10-23 ENCOUNTER — Telehealth: Payer: Self-pay | Admitting: Hematology and Oncology

## 2017-10-23 NOTE — Telephone Encounter (Signed)
Called pt re appts that were added per 6/12 sch msg - spoke w/ pt re appts.  °

## 2017-10-24 ENCOUNTER — Other Ambulatory Visit: Payer: Self-pay | Admitting: Hematology and Oncology

## 2017-10-24 DIAGNOSIS — D62 Acute posthemorrhagic anemia: Secondary | ICD-10-CM

## 2017-10-24 DIAGNOSIS — R918 Other nonspecific abnormal finding of lung field: Secondary | ICD-10-CM

## 2017-10-24 DIAGNOSIS — Z8521 Personal history of malignant neoplasm of larynx: Secondary | ICD-10-CM

## 2017-10-24 DIAGNOSIS — E039 Hypothyroidism, unspecified: Secondary | ICD-10-CM

## 2017-10-27 ENCOUNTER — Ambulatory Visit: Payer: Medicaid Other | Admitting: Hematology and Oncology

## 2017-10-27 ENCOUNTER — Other Ambulatory Visit: Payer: Medicaid Other

## 2017-10-27 ENCOUNTER — Encounter: Payer: Self-pay | Admitting: Hematology and Oncology

## 2017-10-28 ENCOUNTER — Telehealth: Payer: Self-pay | Admitting: Hematology and Oncology

## 2017-10-28 NOTE — Telephone Encounter (Signed)
Patient called to r/s missed appointments.

## 2017-11-03 ENCOUNTER — Telehealth: Payer: Self-pay | Admitting: *Deleted

## 2017-11-03 ENCOUNTER — Telehealth: Payer: Self-pay | Admitting: Hematology and Oncology

## 2017-11-03 ENCOUNTER — Inpatient Hospital Stay (HOSPITAL_BASED_OUTPATIENT_CLINIC_OR_DEPARTMENT_OTHER): Payer: Medicaid Other | Admitting: Hematology and Oncology

## 2017-11-03 ENCOUNTER — Inpatient Hospital Stay: Payer: Medicaid Other | Attending: Hematology and Oncology

## 2017-11-03 ENCOUNTER — Encounter: Payer: Self-pay | Admitting: Hematology and Oncology

## 2017-11-03 ENCOUNTER — Other Ambulatory Visit: Payer: Self-pay | Admitting: Hematology and Oncology

## 2017-11-03 DIAGNOSIS — F17210 Nicotine dependence, cigarettes, uncomplicated: Secondary | ICD-10-CM | POA: Diagnosis not present

## 2017-11-03 DIAGNOSIS — F1010 Alcohol abuse, uncomplicated: Secondary | ICD-10-CM

## 2017-11-03 DIAGNOSIS — D509 Iron deficiency anemia, unspecified: Secondary | ICD-10-CM

## 2017-11-03 DIAGNOSIS — Z8521 Personal history of malignant neoplasm of larynx: Secondary | ICD-10-CM

## 2017-11-03 DIAGNOSIS — E039 Hypothyroidism, unspecified: Secondary | ICD-10-CM | POA: Insufficient documentation

## 2017-11-03 DIAGNOSIS — F17200 Nicotine dependence, unspecified, uncomplicated: Secondary | ICD-10-CM | POA: Insufficient documentation

## 2017-11-03 DIAGNOSIS — D62 Acute posthemorrhagic anemia: Secondary | ICD-10-CM

## 2017-11-03 LAB — COMPREHENSIVE METABOLIC PANEL
ALT: 15 U/L (ref 0–55)
AST: 39 U/L — ABNORMAL HIGH (ref 5–34)
Albumin: 3.9 g/dL (ref 3.5–5.0)
Alkaline Phosphatase: 144 U/L (ref 40–150)
Anion gap: 13 — ABNORMAL HIGH (ref 3–11)
BUN: 6 mg/dL — ABNORMAL LOW (ref 7–26)
CALCIUM: 9.7 mg/dL (ref 8.4–10.4)
CHLORIDE: 107 mmol/L (ref 98–109)
CO2: 20 mmol/L — ABNORMAL LOW (ref 22–29)
CREATININE: 0.68 mg/dL (ref 0.60–1.10)
GFR calc Af Amer: >60 mL/min (ref >60)
GFR calc non Af Amer: >60 mL/min (ref >60)
GLUCOSE: 68 mg/dL — AB (ref 70–140)
Potassium: 3.5 mmol/L (ref 3.5–5.1)
Sodium: 140 mmol/L (ref 136–145)
Total Bilirubin: 0.6 mg/dL (ref 0.2–1.2)
Total Protein: 6.8 g/dL (ref 6.4–8.3)

## 2017-11-03 LAB — IRON AND TIBC
IRON: 21 ug/dL — AB (ref 41–142)
Saturation Ratios: 6 % — ABNORMAL LOW (ref 21–57)
TIBC: 345 ug/dL (ref 236–444)
UIBC: 324 ug/dL

## 2017-11-03 LAB — CBC WITH DIFFERENTIAL/PLATELET
Basophils Absolute: 0.1 10*3/uL (ref 0.0–0.1)
Basophils Relative: 3 %
EOS PCT: 2 %
Eosinophils Absolute: 0.1 10*3/uL (ref 0.0–0.5)
HEMATOCRIT: 31.2 % — AB (ref 34.8–46.6)
Hemoglobin: 9 g/dL — ABNORMAL LOW (ref 11.6–15.9)
LYMPHS PCT: 14 %
Lymphs Abs: 0.5 10*3/uL — ABNORMAL LOW (ref 0.9–3.3)
MCH: 23.6 pg — ABNORMAL LOW (ref 25.1–34.0)
MCHC: 28.8 g/dL — AB (ref 31.5–36.0)
MCV: 81.7 fL (ref 79.5–101.0)
MONO ABS: 0.5 10*3/uL (ref 0.1–0.9)
Monocytes Relative: 14 %
NEUTROS ABS: 2.2 10*3/uL (ref 1.5–6.5)
NEUTROS PCT: 67 %
PLATELETS: 234 10*3/uL (ref 145–400)
RBC: 3.82 MIL/uL (ref 3.70–5.45)
RDW: 21.7 % — AB (ref 11.2–14.5)
WBC: 3.3 10*3/uL — ABNORMAL LOW (ref 3.9–10.3)

## 2017-11-03 LAB — TSH: TSH: 0.214 u[IU]/mL — ABNORMAL LOW (ref 0.308–3.960)

## 2017-11-03 LAB — FERRITIN: FERRITIN: 18 ng/mL (ref 9–269)

## 2017-11-03 LAB — SEDIMENTATION RATE: SED RATE: 12 mm/h (ref 0–22)

## 2017-11-03 MED ORDER — LEVOTHYROXINE SODIUM 25 MCG PO TABS: 25.0000 ug | ORAL_TABLET | Freq: Every day | ORAL | 1 refills | Status: DC

## 2017-11-03 NOTE — Telephone Encounter (Signed)
Per 6/24 los.  Spoke w/ patient re scheduled lab/dr appt for 02/03/18.

## 2017-11-03 NOTE — Assessment & Plan Note (Addendum)
I have advised the patient not to drink due to risk of cancer recurrence She will try to quit

## 2017-11-03 NOTE — Assessment & Plan Note (Signed)
Her thyroid medicine has not been regulated recently TSH is profoundly suppressed I will reduce the dose of her thyroid medicine and recheck in 3 months

## 2017-11-03 NOTE — Telephone Encounter (Signed)
Pt notified that Dr Alvy Bimler wants her to decrease synthroid to 25 mcg- new prescription sent to [facility]   Also let patient know that she does need IV Iron and she will receive a call from schedulers to make an appt  Verbalized understanding

## 2017-11-03 NOTE — Telephone Encounter (Signed)
Called pt re appts that were added per 6/24 - spoke w/ pt re appts

## 2017-11-03 NOTE — Assessment & Plan Note (Addendum)
She is symptomatic for recurrent iron deficiency anemia Her oral intake is poor and she has lost a lot of weight She is symptomatic and unable to take oral iron supplement I recommend intravenous iron infusion therapy and she agreed to proceed I have review EGD and colonoscopy dated 10/12/2015 that were negative

## 2017-11-03 NOTE — Progress Notes (Signed)
[facility] OFFICE PROGRESS NOTE  Patient Care Team: Crystal Spence, [place] as PCP - General (Physician Assistant) Chester Holstein, MD (Family Medicine) Heath Lark, MD as Consulting Physician (Hematology and Oncology) Ruby Cola, MD as Consulting Physician (Otolaryngology)  ASSESSMENT & PLAN:  History of laryngeal cancer She is cancer free from disease standpoint I have advised the patient to stop smoking and drinking as these are risk factors associated with cancer recurrence I plan to see her back again in 3 months for further follow-up  Hypothyroidism (acquired) Her thyroid medicine has not been regulated recently TSH is profoundly suppressed I will reduce the dose of her thyroid medicine and recheck in 3 months  Iron deficiency anemia She is symptomatic for recurrent iron deficiency anemia Her oral intake is poor and she has lost a lot of weight She is symptomatic and unable to take oral iron supplement I recommend intravenous iron infusion therapy and she agreed to proceed I have review EGD and colonoscopy dated 10/12/2015 that were negative  Alcohol abuse I have advised the patient not to drink due to risk of cancer recurrence She will try to quit  Smoking The patient has resumed smoking habit She is made aware about risk of cancer recurrence She is attempting to quit on her own   No orders of the defined types were placed in this encounter.   INTERVAL HISTORY: Please see below for problem oriented charting. She is referred here because of anemia She feels tired She is currently smoking half a pack of cigarettes per day and drinks alcohol on a regular basis She was noted to be seen in the emergency department in January for seizures due to alcohol intake She has lost some weight Her appetite is poor She is taking thyroid replacement therapy but stated that nobody is really checking her thyroid level The patient denies any recent signs or  symptoms of bleeding such as spontaneous epistaxis, hematuria or hematochezia.  SUMMARY OF ONCOLOGIC HISTORY:  This is a pleasant lady with diagnosis of laryngeal cancer from May of 2012. According to the patient, she presented with sensation of sore throat. She was treated with chemotherapy weekly along with radiation therapy. The patient had placement of tracheostomy. She had unexplained weight loss in August 2015. The CT scan showed possible pneumonia and she was treated with antibiotic therapy. Last imaging study on 08/26/2014 show no evidence of recurrence of cancer  REVIEW OF SYSTEMS:   Constitutional: Denies fevers, chills  Eyes: Denies blurriness of vision Ears, nose, mouth, throat, and face: Denies mucositis or sore throat Respiratory: Denies cough, dyspnea or wheezes Cardiovascular: Denies palpitation, chest discomfort or lower extremity swelling Gastrointestinal:  Denies nausea, heartburn or change in bowel habits Skin: Denies abnormal skin rashes Lymphatics: Denies new lymphadenopathy or easy bruising Neurological:Denies numbness, tingling or new weaknesses Behavioral/Psych: Mood is stable, no new changes  All other systems were reviewed with the patient and are negative.  I have reviewed the past medical history, past surgical history, social history and family history with the patient and they are unchanged from previous note.  ALLERGIES:  is allergic to no known allergies.  MEDICATIONS:  Current Outpatient Medications  Medication Sig Dispense Refill  . albuterol (PROVENTIL HFA;VENTOLIN HFA) 108 (90 Base) MCG/ACT inhaler Inhale 2 puffs into the lungs every 2 (two) hours as needed for wheezing or shortness of breath (cough). 1 Inhaler 0  . butalbital-acetaminophen-caffeine (FIORICET, ESGIC) 50-325-40 MG tablet Take 1 tablet by mouth 3 (three)  times daily as needed for headache or migraine.   2  . clonazePAM (KLONOPIN) 0.5 MG tablet Take 0.5 mg by mouth daily as needed for  anxiety.    . fluticasone (FLONASE) 50 MCG/ACT nasal spray Place 2 sprays into both nostrils daily. 16 g 0  . levothyroxine (SYNTHROID, LEVOTHROID) 50 MCG tablet Take 50 mcg by mouth daily before breakfast.    . polyethylene glycol (MIRALAX / GLYCOLAX) packet Take 17 g by mouth 2 (two) times daily. 14 each 0  . senna-docusate (SENOKOT-S) 8.6-50 MG tablet Take 1 tablet by mouth at bedtime.    . topiramate (TOPAMAX) 50 MG tablet Take 100 mg by mouth 2 (two) times daily.     . traZODone (DESYREL) 100 MG tablet Take 100 mg by mouth at bedtime.  1  . folic acid (FOLVITE) 1 MG tablet Take 1 tablet (1 mg total) by mouth daily.    Marland Kitchen HYDROcodone-homatropine (HYCODAN) 5-1.5 MG/5ML syrup Take 5 mLs by mouth every 6 (six) hours as needed for cough. 180 mL 0  . montelukast (SINGULAIR) 10 MG tablet TAKE 1 TABLET BY MOUTH AT BEDTIME  0  . Multiple Vitamin (MULTIVITAMIN WITH MINERALS) TABS tablet Take 1 tablet by mouth daily.    . nicotine (NICODERM CQ - DOSED IN MG/24 HOURS) 21 mg/24hr patch Place 1 patch (21 mg total) onto the skin daily. 28 patch 0  . pantoprazole (PROTONIX) 40 MG tablet Take 1 tablet (40 mg total) by mouth daily. 30 tablet 0  . thiamine 100 MG tablet Take 1 tablet (100 mg total) by mouth daily.     No current facility-administered medications for this visit.     PHYSICAL EXAMINATION: ECOG PERFORMANCE STATUS: 1 - Symptomatic but completely ambulatory  Vitals:   11/03/17 0822  BP: (!) 150/96  Pulse: 96  Resp: 18  Temp: 98 F (36.7 C)  SpO2: 100%   Filed Weights   11/03/17 0822  Weight: 82 lb 3.2 oz (37.3 kg)    GENERAL:alert, no distress and comfortable.  She looks thin SKIN: skin color, texture, turgor are normal, no rashes or significant lesions EYES: normal, Conjunctiva are pink and non-injected, sclera clear OROPHARYNX:no exudate, no erythema and lips, buccal mucosa, and tongue normal.  Poor dentition is noted NECK: supple, thyroid normal size, non-tender, without  nodularity LYMPH:  no palpable lymphadenopathy in the cervical, axillary or inguinal LUNGS: clear to auscultation and percussion with normal breathing effort HEART: regular rate & rhythm and no murmurs and no lower extremity edema ABDOMEN:abdomen soft, non-tender and normal bowel sounds Musculoskeletal:no cyanosis of digits and no clubbing  NEURO: alert & oriented x 3 with fluent speech, no focal motor/sensory deficits  LABORATORY DATA:  I have reviewed the data as listed    Component Value Date/Time   NA 140 11/03/2017 0807   NA 138 12/14/2015 1412   K 3.5 11/03/2017 0807   K 3.8 12/14/2015 1412   CL 107 11/03/2017 0807   CO2 20 (L) 11/03/2017 0807   CO2 20 (L) 12/14/2015 1412   GLUCOSE 68 (L) 11/03/2017 0807   GLUCOSE 77 12/14/2015 1412   BUN 6 (L) 11/03/2017 0807   BUN 4.6 (L) 12/14/2015 1412   CREATININE 0.68 11/03/2017 0807   CREATININE 0.7 12/14/2015 1412   CALCIUM 9.7 11/03/2017 0807   CALCIUM 9.4 12/14/2015 1412   PROT 6.8 11/03/2017 0807   PROT 7.3 12/14/2015 1412   ALBUMIN 3.9 11/03/2017 0807   ALBUMIN 3.8 12/14/2015 1412   AST 39 (  H) 11/03/2017 0807   AST 107 (H) 12/14/2015 1412   ALT 15 11/03/2017 0807   ALT 27 12/14/2015 1412   ALKPHOS 144 11/03/2017 0807   ALKPHOS 178 (H) 12/14/2015 1412   BILITOT 0.6 11/03/2017 0807   BILITOT <0.30 12/14/2015 1412   GFRNONAA >60 11/03/2017 0807   GFRAA >60 11/03/2017 0807    No results found for: SPEP, UPEP  Lab Results  Component Value Date   WBC 3.3 (L) 11/03/2017   NEUTROABS 2.2 11/03/2017   HGB 9.0 (L) 11/03/2017   HCT 31.2 (L) 11/03/2017   MCV 81.7 11/03/2017   PLT 234 11/03/2017      Chemistry      Component Value Date/Time   NA 140 11/03/2017 0807   NA 138 12/14/2015 1412   K 3.5 11/03/2017 0807   K 3.8 12/14/2015 1412   CL 107 11/03/2017 0807   CO2 20 (L) 11/03/2017 0807   CO2 20 (L) 12/14/2015 1412   BUN 6 (L) 11/03/2017 0807   BUN 4.6 (L) 12/14/2015 1412   CREATININE 0.68 11/03/2017 0807    CREATININE 0.7 12/14/2015 1412      Component Value Date/Time   CALCIUM 9.7 11/03/2017 0807   CALCIUM 9.4 12/14/2015 1412   ALKPHOS 144 11/03/2017 0807   ALKPHOS 178 (H) 12/14/2015 1412   AST 39 (H) 11/03/2017 0807   AST 107 (H) 12/14/2015 1412   ALT 15 11/03/2017 0807   ALT 27 12/14/2015 1412   BILITOT 0.6 11/03/2017 0807   BILITOT <0.30 12/14/2015 1412      All questions were answered. The patient knows to call the clinic with any problems, questions or concerns. No barriers to learning was detected.  I spent 25 minutes counseling the patient face to face. The total time spent in the appointment was 30 minutes and more than 50% was on counseling and review of test results  Heath Lark, MD 11/03/2017 10:37 AM

## 2017-11-03 NOTE — Assessment & Plan Note (Signed)
The patient has resumed smoking habit She is made aware about risk of cancer recurrence She is attempting to quit on her own

## 2017-11-03 NOTE — Assessment & Plan Note (Signed)
She is cancer free from disease standpoint I have advised the patient to stop smoking and drinking as these are risk factors associated with cancer recurrence I plan to see her back again in 3 months for further follow-up

## 2017-11-07 ENCOUNTER — Inpatient Hospital Stay: Payer: Medicaid Other

## 2017-11-07 VITALS — BP 96/69 | HR 88 | Temp 98.0°F | Resp 18

## 2017-11-07 DIAGNOSIS — D509 Iron deficiency anemia, unspecified: Secondary | ICD-10-CM

## 2017-11-07 DIAGNOSIS — Z8521 Personal history of malignant neoplasm of larynx: Secondary | ICD-10-CM | POA: Diagnosis not present

## 2017-11-07 MED ORDER — SODIUM CHLORIDE 0.9 % IV SOLN
510.0000 mg | Freq: Once | INTRAVENOUS | Status: AC
  Administered 2017-11-07: 510 mg via INTRAVENOUS
  Filled 2017-11-07: qty 17

## 2017-11-07 MED ORDER — SODIUM CHLORIDE 0.9 % IV SOLN
Freq: Once | INTRAVENOUS | Status: AC
  Administered 2017-11-07: 11:00:00 via INTRAVENOUS

## 2017-11-07 NOTE — Patient Instructions (Signed)

## 2017-11-14 ENCOUNTER — Inpatient Hospital Stay: Payer: Medicaid Other | Attending: Hematology and Oncology

## 2017-11-14 VITALS — BP 116/88 | HR 80 | Temp 98.4°F | Resp 16

## 2017-11-14 DIAGNOSIS — D509 Iron deficiency anemia, unspecified: Secondary | ICD-10-CM | POA: Insufficient documentation

## 2017-11-14 MED ORDER — SODIUM CHLORIDE 0.9 % IV SOLN
Freq: Once | INTRAVENOUS | Status: AC
  Administered 2017-11-14: 10:00:00 via INTRAVENOUS

## 2017-11-14 MED ORDER — FERUMOXYTOL INJECTION 510 MG/17 ML
510.0000 mg | Freq: Once | INTRAVENOUS | Status: AC
  Administered 2017-11-14: 510 mg via INTRAVENOUS
  Filled 2017-11-14: qty 17

## 2017-11-14 NOTE — Patient Instructions (Signed)

## 2017-11-24 IMAGING — CT CT CHEST W/ CM
2 of 3 series · 15 of 36 positions shown, 18 images · IV contrast (Omni 300)
Comparison: Chest x-ray 03/17/2016 and prior chest CT 08/26/2014

CLINICAL DATA: Three-week history of productive cough, fatigue and
lack of appetite. Abnormal chest x-ray.

EXAM:
CT CHEST WITH CONTRAST
TECHNIQUE: Multidetector CT imaging of the chest was performed during
intravenous contrast administration.
CONTRAST:  75mL [id number]-KGG IOPAMIDOL ([id number]) INJECTION 61%

[Series 2: chest with 2mm st · axial · 0.61mm/px · z∈[+1244,+1500]mm · 12 of 152 slices shown, 15 images]
[im 12/152  mediastinal]
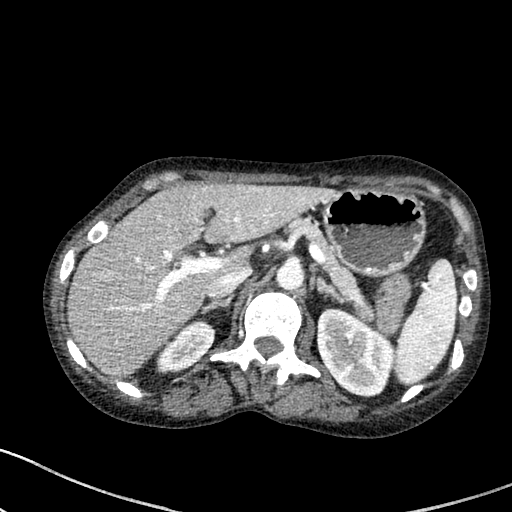
[im 12/152  lung]
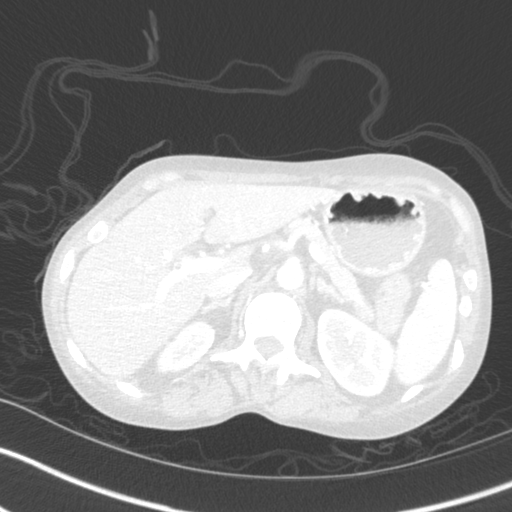
[im 23/152  lung]
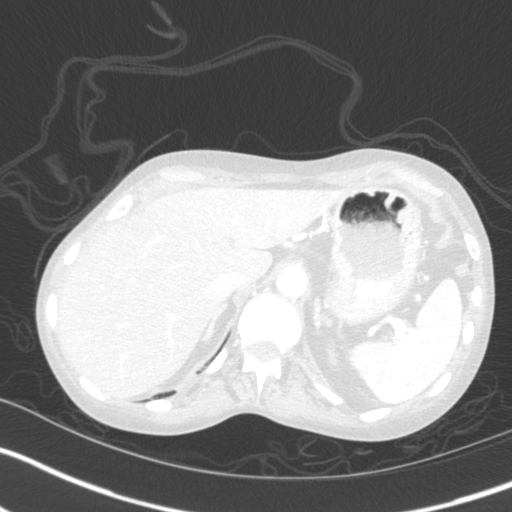
[im 34/152  lung]
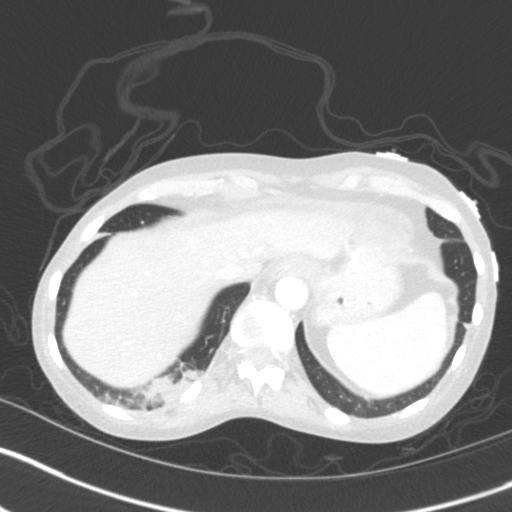
[im 45/152  lung]
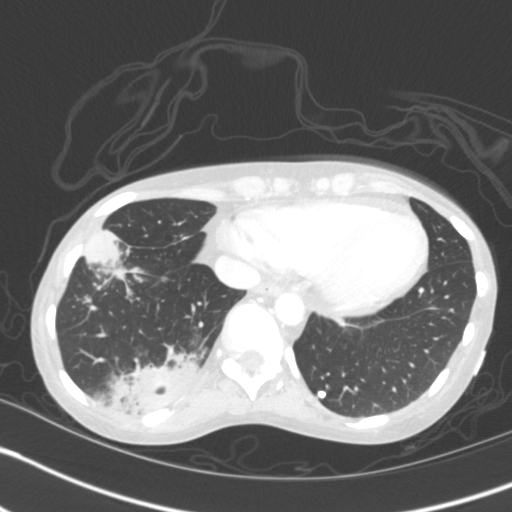
[im 56/152  mediastinal]
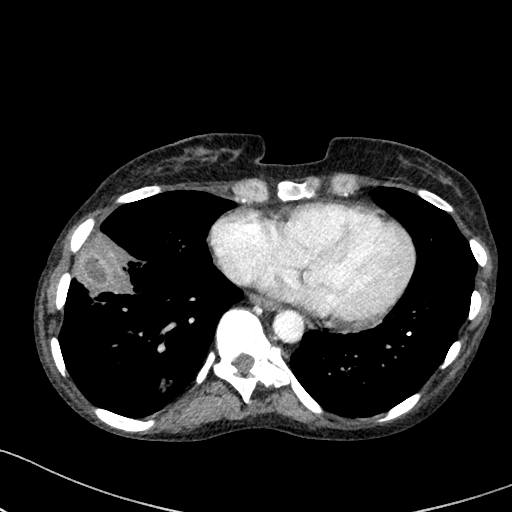
[im 56/152  lung]
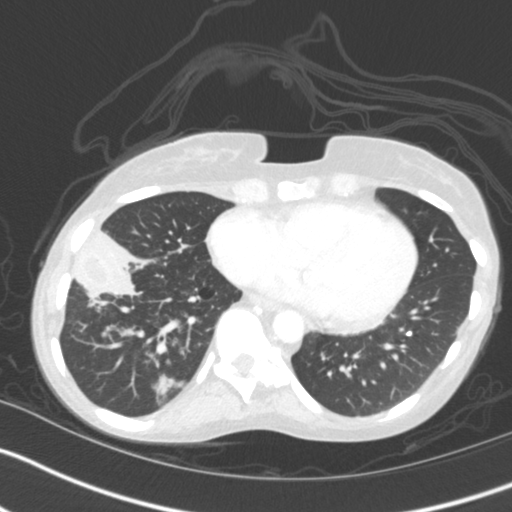
[im 68/152  lung]
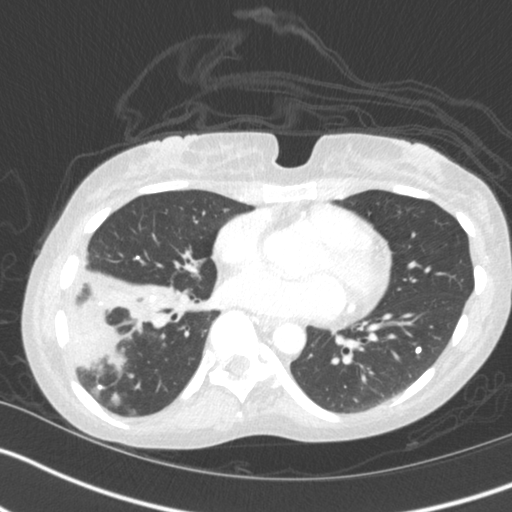
[im 84/152  lung]
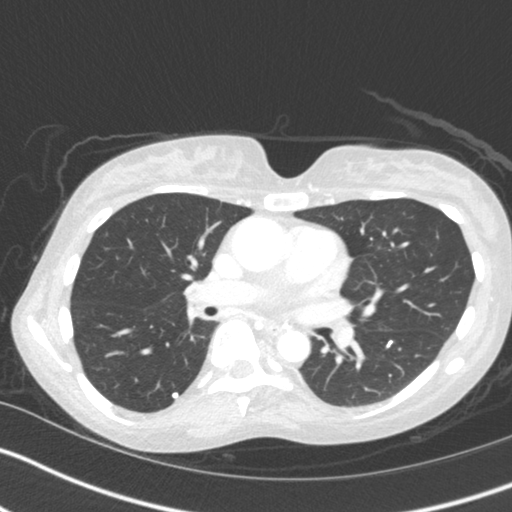
[im 96/152  lung]
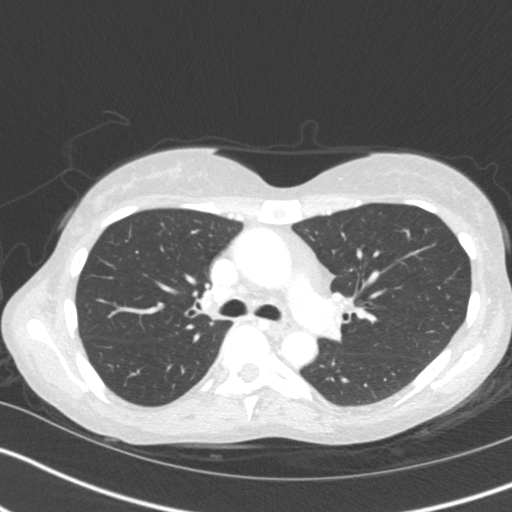
[im 107/152  mediastinal]
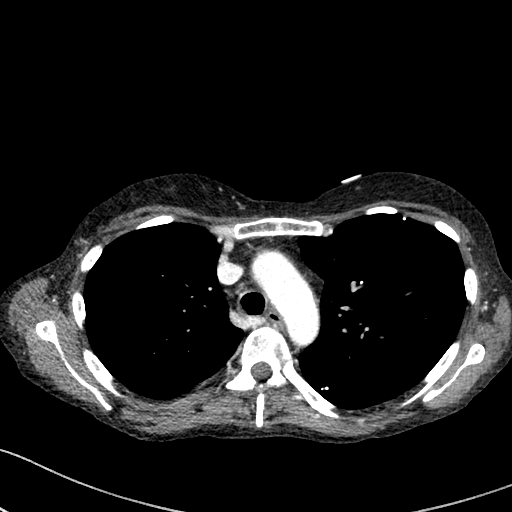
[im 107/152  lung]
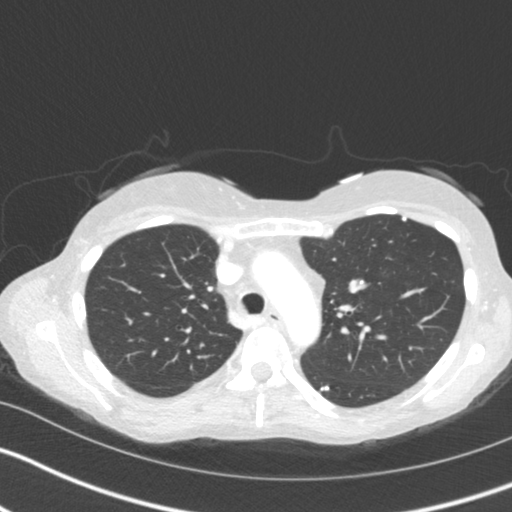
[im 118/152  lung]
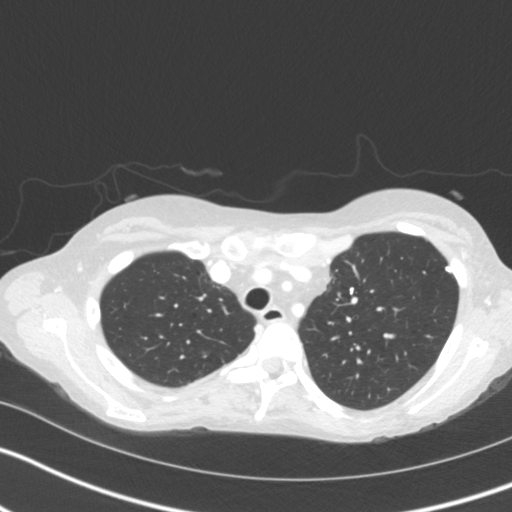
[im 129/152  lung]
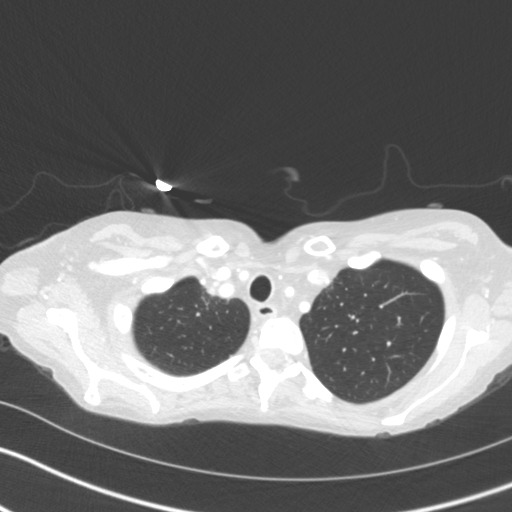
[im 140/152  lung]
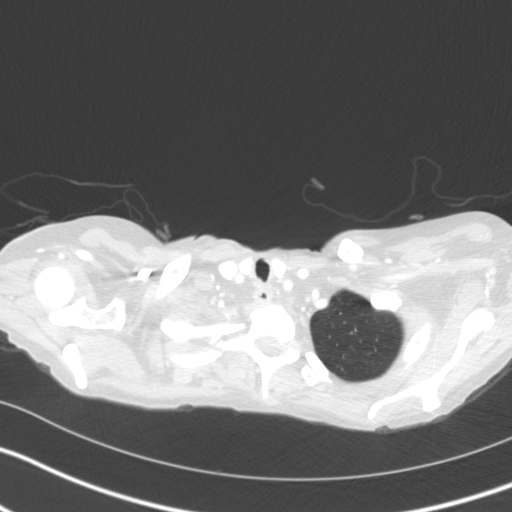

[Series 4: chest with 3mm st cor · coronal · 0.53mm/px · 3 of 89 slices shown]
[im 18/89  lung]
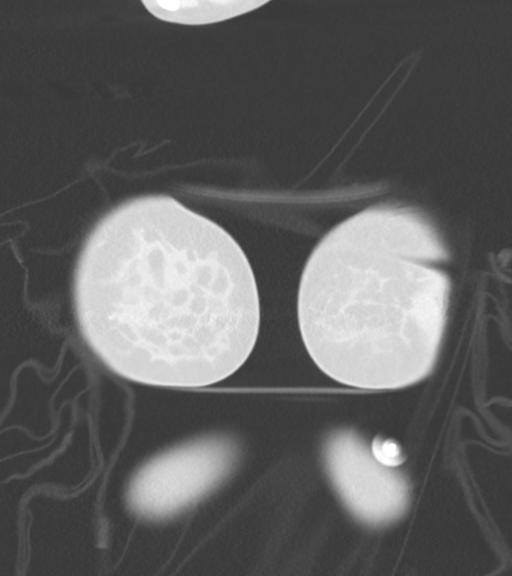
[im 36/89  lung]
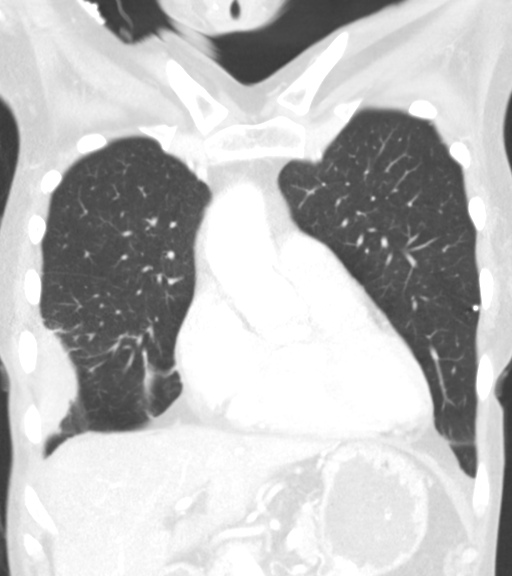
[im 53/89  lung]
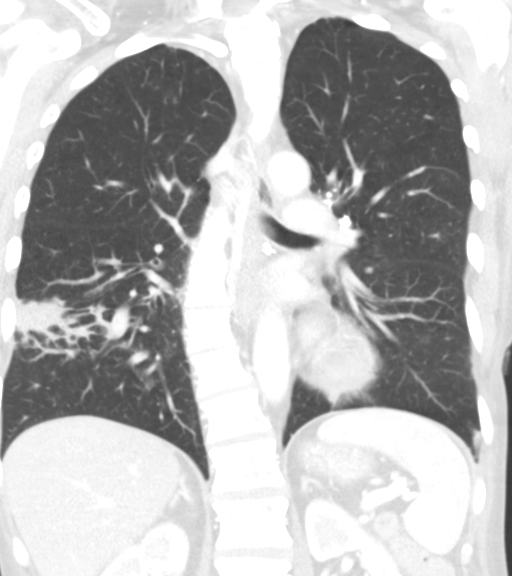

[15 of 36 positions shown; findings below may reference images not displayed]

FINDINGS: Chest wall: No breast masses, supraclavicular or axillary
lymphadenopathy. Small scattered lymph nodes are noted. The thyroid
gland is grossly normal.

Cardiovascular: The heart is normal in size given the pectus
deformity. No change since prior study. Mild mass effect on the
right ventricle. The aorta is normal in caliber. No dissection. The
branch vessels are patent. No coronary artery calcifications.

Mediastinum/Nodes: No mediastinal or hilar mass or lymphadenopathy.
Small scattered lymph nodes are noted. The esophagus is grossly
normal. There are calcified mediastinal and hilar lymph nodes along
with calcified granulomas in both lungs.

Lungs/Pleura: Extensive right lower lobe pneumonia with associated
lung abscesses. The left lung is clear. No pleural effusions. No
worrisome pulmonary lesions.

Upper Abdomen: No significant upper abdominal findings.

Musculoskeletal: Significant scoliosis.  No bone lesions.
IMPRESSION: 1. Right lower lobe infiltrates and associated abscesses. This is
likely an aggressive or atypical pneumonia.
2. Evidence of prior granulomatous disease with numerous calcified
mediastinal and hilar lymph nodes and calcified granulomas in both
lungs.
3. No mediastinal or hilar mass or lymphadenopathy.
4. No significant upper abdominal findings.

## 2018-01-17 ENCOUNTER — Encounter (HOSPITAL_COMMUNITY): Payer: Self-pay

## 2018-01-17 ENCOUNTER — Other Ambulatory Visit: Payer: Self-pay

## 2018-01-17 ENCOUNTER — Inpatient Hospital Stay (HOSPITAL_COMMUNITY)
Admission: EM | Admit: 2018-01-17 | Discharge: 2018-01-19 | DRG: 378 | Disposition: A | Discharge status: Home / Self Care | Payer: Medicaid Other | Attending: Internal Medicine | Admitting: Internal Medicine

## 2018-01-17 ENCOUNTER — Emergency Department (HOSPITAL_COMMUNITY): Payer: Medicaid Other

## 2018-01-17 DIAGNOSIS — F419 Anxiety disorder, unspecified: Secondary | ICD-10-CM | POA: Diagnosis present

## 2018-01-17 DIAGNOSIS — Z8719 Personal history of other diseases of the digestive system: Secondary | ICD-10-CM

## 2018-01-17 DIAGNOSIS — K921 Melena: Secondary | ICD-10-CM

## 2018-01-17 DIAGNOSIS — Z7951 Long term (current) use of inhaled steroids: Secondary | ICD-10-CM

## 2018-01-17 DIAGNOSIS — Z85819 Personal history of malignant neoplasm of unspecified site of lip, oral cavity, and pharynx: Secondary | ICD-10-CM | POA: Diagnosis not present

## 2018-01-17 DIAGNOSIS — G40909 Epilepsy, unspecified, not intractable, without status epilepticus: Secondary | ICD-10-CM | POA: Diagnosis present

## 2018-01-17 DIAGNOSIS — D509 Iron deficiency anemia, unspecified: Secondary | ICD-10-CM

## 2018-01-17 DIAGNOSIS — F1010 Alcohol abuse, uncomplicated: Secondary | ICD-10-CM | POA: Diagnosis present

## 2018-01-17 DIAGNOSIS — Z9889 Other specified postprocedural states: Secondary | ICD-10-CM | POA: Diagnosis not present

## 2018-01-17 DIAGNOSIS — K922 Gastrointestinal hemorrhage, unspecified: Secondary | ICD-10-CM | POA: Diagnosis not present

## 2018-01-17 DIAGNOSIS — Z8521 Personal history of malignant neoplasm of larynx: Secondary | ICD-10-CM

## 2018-01-17 DIAGNOSIS — F17210 Nicotine dependence, cigarettes, uncomplicated: Secondary | ICD-10-CM | POA: Diagnosis present

## 2018-01-17 DIAGNOSIS — Z79899 Other long term (current) drug therapy: Secondary | ICD-10-CM

## 2018-01-17 DIAGNOSIS — Z7289 Other problems related to lifestyle: Secondary | ICD-10-CM | POA: Diagnosis not present

## 2018-01-17 DIAGNOSIS — R7989 Other specified abnormal findings of blood chemistry: Secondary | ICD-10-CM | POA: Diagnosis not present

## 2018-01-17 DIAGNOSIS — E039 Hypothyroidism, unspecified: Secondary | ICD-10-CM | POA: Diagnosis present

## 2018-01-17 DIAGNOSIS — G43909 Migraine, unspecified, not intractable, without status migrainosus: Secondary | ICD-10-CM | POA: Diagnosis present

## 2018-01-17 DIAGNOSIS — Z923 Personal history of irradiation: Secondary | ICD-10-CM

## 2018-01-17 DIAGNOSIS — Z9221 Personal history of antineoplastic chemotherapy: Secondary | ICD-10-CM

## 2018-01-17 DIAGNOSIS — Z930 Tracheostomy status: Secondary | ICD-10-CM

## 2018-01-17 DIAGNOSIS — L8990 Pressure ulcer of unspecified site, unspecified stage: Secondary | ICD-10-CM

## 2018-01-17 DIAGNOSIS — Z79890 Hormone replacement therapy (postmenopausal): Secondary | ICD-10-CM

## 2018-01-17 DIAGNOSIS — B370 Candidal stomatitis: Secondary | ICD-10-CM | POA: Diagnosis present

## 2018-01-17 DIAGNOSIS — Z791 Long term (current) use of non-steroidal anti-inflammatories (NSAID): Secondary | ICD-10-CM

## 2018-01-17 DIAGNOSIS — E876 Hypokalemia: Secondary | ICD-10-CM | POA: Diagnosis present

## 2018-01-17 DIAGNOSIS — Z23 Encounter for immunization: Secondary | ICD-10-CM | POA: Diagnosis not present

## 2018-01-17 DIAGNOSIS — I10 Essential (primary) hypertension: Secondary | ICD-10-CM | POA: Diagnosis present

## 2018-01-17 DIAGNOSIS — Z8711 Personal history of peptic ulcer disease: Secondary | ICD-10-CM

## 2018-01-17 DIAGNOSIS — R9431 Abnormal electrocardiogram [ECG] [EKG]: Secondary | ICD-10-CM | POA: Diagnosis not present

## 2018-01-17 DIAGNOSIS — K254 Chronic or unspecified gastric ulcer with hemorrhage: Secondary | ICD-10-CM | POA: Diagnosis present

## 2018-01-17 DIAGNOSIS — Z808 Family history of malignant neoplasm of other organs or systems: Secondary | ICD-10-CM | POA: Diagnosis not present

## 2018-01-17 DIAGNOSIS — K920 Hematemesis: Secondary | ICD-10-CM

## 2018-01-17 LAB — COMPREHENSIVE METABOLIC PANEL
ALK PHOS: 124 U/L (ref 38–126)
ALT: 27 U/L (ref 0–44)
AST: 31 U/L (ref 15–41)
Albumin: 3.1 g/dL — ABNORMAL LOW (ref 3.5–5.0)
Anion gap: 10 (ref 5–15)
BILIRUBIN TOTAL: 0.5 mg/dL (ref 0.3–1.2)
BUN: 40 mg/dL — AB (ref 6–20)
CHLORIDE: 107 mmol/L (ref 98–111)
CO2: 21 mmol/L — ABNORMAL LOW (ref 22–32)
CREATININE: 1.05 mg/dL — AB (ref 0.44–1.00)
Calcium: 9.4 mg/dL (ref 8.9–10.3)
GFR calc Af Amer: >60 mL/min (ref >60)
Glucose, Bld: 125 mg/dL — ABNORMAL HIGH (ref 70–99)
Potassium: 3.1 mmol/L — ABNORMAL LOW (ref 3.5–5.1)
Sodium: 138 mmol/L (ref 135–145)
Total Protein: 5.3 g/dL — ABNORMAL LOW (ref 6.5–8.1)

## 2018-01-17 LAB — CBC
HEMATOCRIT: 38.7 % (ref 36.0–46.0)
HEMOGLOBIN: 12.5 g/dL (ref 12.0–15.0)
MCH: 31.2 pg (ref 26.0–34.0)
MCHC: 32.3 g/dL (ref 30.0–36.0)
MCV: 96.5 fL (ref 78.0–100.0)
Platelets: 221 10*3/uL (ref 150–400)
RBC: 4.01 MIL/uL (ref 3.87–5.11)
RDW: 16.5 % — AB (ref 11.5–15.5)
WBC: 7.3 10*3/uL (ref 4.0–10.5)

## 2018-01-17 LAB — I-STAT TROPONIN, ED: TROPONIN I, POC: 0.03 ng/mL (ref 0.00–0.08)

## 2018-01-17 LAB — ABO/RH: ABO/RH(D): O POS

## 2018-01-17 LAB — TYPE AND SCREEN
ABO/RH(D): O POS
ANTIBODY SCREEN: NEGATIVE

## 2018-01-17 LAB — POC OCCULT BLOOD, ED: FECAL OCCULT BLD: POSITIVE — AB

## 2018-01-17 MED ORDER — SODIUM CHLORIDE 0.9% FLUSH: 3.0000 mL | Freq: Two times a day (BID) | INTRAVENOUS | Status: DC

## 2018-01-17 MED ORDER — POTASSIUM CHLORIDE CRYS ER 10 MEQ PO TBCR
10.0000 meq | EXTENDED_RELEASE_TABLET | Freq: Every day | ORAL | Status: DC
  Administered 2018-01-17 – 2018-01-18 (×2): 10 meq via ORAL
  Filled 2018-01-17 (×2): qty 1

## 2018-01-17 MED ORDER — LEVOTHYROXINE SODIUM 25 MCG PO TABS
25.0000 ug | ORAL_TABLET | Freq: Every day | ORAL | Status: DC
  Administered 2018-01-19: 25 ug via ORAL
  Filled 2018-01-17 (×2): qty 1

## 2018-01-17 MED ORDER — ACETAMINOPHEN 650 MG RE SUPP: 650.0000 mg | Freq: Four times a day (QID) | RECTAL | Status: DC | PRN

## 2018-01-17 MED ORDER — HYDROXYZINE HCL 50 MG PO TABS
50.0000 mg | ORAL_TABLET | Freq: Every day | ORAL | Status: DC
  Administered 2018-01-17 – 2018-01-18 (×2): 50 mg via ORAL
  Filled 2018-01-17 (×2): qty 1

## 2018-01-17 MED ORDER — SODIUM CHLORIDE 0.9 % IV SOLN: 250.0000 mL | INTRAVENOUS | Status: DC | PRN

## 2018-01-17 MED ORDER — ACETAMINOPHEN 325 MG PO TABS
650.0000 mg | ORAL_TABLET | Freq: Four times a day (QID) | ORAL | Status: DC | PRN
  Administered 2018-01-17 – 2018-01-18 (×2): 650 mg via ORAL
  Filled 2018-01-17 (×2): qty 2

## 2018-01-17 MED ORDER — SODIUM CHLORIDE 0.9 % IV SOLN
INTRAVENOUS | Status: DC
  Administered 2018-01-17: 19:00:00 via INTRAVENOUS

## 2018-01-17 MED ORDER — SODIUM CHLORIDE 0.9% FLUSH: 3.0000 mL | INTRAVENOUS | Status: DC | PRN

## 2018-01-17 MED ORDER — SODIUM CHLORIDE 0.9 % IV BOLUS
1000.0000 mL | Freq: Once | INTRAVENOUS | Status: AC
  Administered 2018-01-17: 1000 mL via INTRAVENOUS

## 2018-01-17 MED ORDER — IOPAMIDOL (ISOVUE-300) INJECTION 61%
INTRAVENOUS | Status: AC
  Filled 2018-01-17: qty 100

## 2018-01-17 MED ORDER — SENNOSIDES-DOCUSATE SODIUM 8.6-50 MG PO TABS: 1.0000 | ORAL_TABLET | Freq: Every evening | ORAL | Status: DC | PRN

## 2018-01-17 MED ORDER — SODIUM CHLORIDE 0.9 % IV SOLN
8.0000 mg/h | INTRAVENOUS | Status: DC
  Administered 2018-01-17 – 2018-01-18 (×4): 8 mg/h via INTRAVENOUS
  Filled 2018-01-17 (×7): qty 80

## 2018-01-17 MED ORDER — POTASSIUM CHLORIDE CRYS ER 20 MEQ PO TBCR
40.0000 meq | EXTENDED_RELEASE_TABLET | Freq: Once | ORAL | Status: AC
  Administered 2018-01-17: 40 meq via ORAL
  Filled 2018-01-17: qty 2

## 2018-01-17 MED ORDER — CLONAZEPAM 1 MG PO TABS
1.0000 mg | ORAL_TABLET | Freq: Every day | ORAL | Status: DC | PRN
  Administered 2018-01-17 – 2018-01-18 (×2): 1 mg via ORAL
  Filled 2018-01-17 (×2): qty 1

## 2018-01-17 MED ORDER — POTASSIUM CHLORIDE CRYS ER 20 MEQ PO TBCR
20.0000 meq | EXTENDED_RELEASE_TABLET | Freq: Every day | ORAL | Status: DC
  Administered 2018-01-18 – 2018-01-19 (×2): 20 meq via ORAL
  Filled 2018-01-17 (×2): qty 1

## 2018-01-17 MED ORDER — SODIUM CHLORIDE 0.9 % IV SOLN
80.0000 mg | Freq: Once | INTRAVENOUS | Status: AC
  Administered 2018-01-17: 80 mg via INTRAVENOUS
  Filled 2018-01-17: qty 80

## 2018-01-17 MED ORDER — IOPAMIDOL (ISOVUE-300) INJECTION 61%
100.0000 mL | Freq: Once | INTRAVENOUS | Status: AC | PRN
  Administered 2018-01-17: 100 mL via INTRAVENOUS

## 2018-01-17 MED ORDER — ALBUTEROL SULFATE (2.5 MG/3ML) 0.083% IN NEBU: 3.0000 mL | INHALATION_SOLUTION | RESPIRATORY_TRACT | Status: DC | PRN

## 2018-01-17 MED ORDER — TOPIRAMATE 100 MG PO TABS
100.0000 mg | ORAL_TABLET | Freq: Two times a day (BID) | ORAL | Status: DC
  Administered 2018-01-17 – 2018-01-19 (×4): 100 mg via ORAL
  Filled 2018-01-17 (×4): qty 1

## 2018-01-17 MED ORDER — TRAZODONE HCL 100 MG PO TABS
100.0000 mg | ORAL_TABLET | Freq: Every day | ORAL | Status: DC
  Administered 2018-01-17 – 2018-01-18 (×2): 100 mg via ORAL
  Filled 2018-01-17 (×2): qty 1

## 2018-01-17 NOTE — H&P (Signed)
Date: 01/17/2018               Patient Name:  [name] MRN: [id number]  DOB: 11/06/1975 Age / Sex: 42 y.o., female   PCP: Trey Sailors, PA         Medical Service: Internal Medicine Teaching Service         Attending Physician: Dr. Gilles Chiquito    First Contact: Dr. Maida Sale Pager: [phone]  Second Contact: Dr. Pearson Grippe Pager: [phone]       After Hours (After 5p/  First Contact Pager: [phone]  weekends / holidays): Second Contact Pager: [phone]   Chief Complaint: Hemetemasis  History of Present Illness: Crystal Spence is a 42 year old woman with medical history significant for iron deficiency anemia, gastritis, duodenitis, previous GI bleed, hypertension, hypothyroidism, seizure disorder, laryngeal cancer diagnosed in 2012 s/p chemotherapy, radiation therapy and tracheostomy presenting with 2-day history of hematemesis.  She was in her usual state of health until 2 days ago when she had an episode of coffee-ground emesis which she quantifies at half a cupful with concurrent gradually increasing left upper quadrant/epigastric pain.  Since onset, she has had 4 episodes of hematemesis the morning of admission with consistency of blood clots.  She also reports headaches, dizziness especially with standing, lightheadedness, shortness of breath but denies chest pain, hemoptysis, palpitation, dysrhythmia or syncope.  Her abdominal pain has been at a 7/10 and is sharp in nature, not related to diet.  Also she had an episode of melena the morning of admission but denies hematochezia, bright red blood per rectum.  She has chronic migraines and usually takes 2 Tylenol PM or ibuprofen daily.  Per chart review, she presented to the emergency department in September 2014 with upper GI bleed.  She was found to be in hypovolemic shock as her hemoglobin was at 3.7 and hypotensive.  She was fluid resuscitated and received 4 units of packed red blood cells.  This episode of upper GI  bleed was precipitated by her daily use of Excedrin for chronic migraine.  EGD at that time showed gastritis, duodenitis and a clean-based ulcer.  ED course: BP 97/80, HR 122, RR 18. Hgb 12.5, elevated RDW 16.5, CMP revealed hypokalemia at 3.1, elevated BUN at 40, Cr. Of 1.05, +FOBT, CT abdomen and pelvis showed no acute abnormality, diffuse hepatic steatosis.    Meds:  Current Meds  Medication Sig  . albuterol (PROVENTIL HFA;VENTOLIN HFA) 108 (90 Base) MCG/ACT inhaler Inhale 2 puffs into the lungs every 2 (two) hours as needed for wheezing or shortness of breath (cough).  . clonazePAM (KLONOPIN) 1 MG tablet Take 1 mg by mouth daily as needed for anxiety.   Marland Kitchen diltiazem (CARDIZEM CD) 240 MG 24 hr capsule Take 240 mg by mouth daily.  . folic acid (FOLVITE) 1 MG tablet Take 1 tablet (1 mg total) by mouth daily.  . hydrOXYzine (VISTARIL) 50 MG capsule Take 50 mg by mouth at bedtime.  Marland Kitchen ibuprofen (ADVIL,MOTRIN) 800 MG tablet Take 800 mg by mouth every 8 (eight) hours as needed for fever or headache.  . levothyroxine (SYNTHROID, LEVOTHROID) 25 MCG tablet TAKE 1 TABLET(25 MCG) BY MOUTH DAILY BEFORE BREAKFAST (Patient taking differently: Take 25 mcg by mouth daily before breakfast. )  . losartan (COZAAR) 25 MG tablet Take 25 mg by mouth daily.  . pantoprazole (PROTONIX) 40 MG tablet Take 1 tablet (40 mg total) by mouth daily.  . polyethylene glycol (MIRALAX / GLYCOLAX) packet  Take 17 g by mouth 2 (two) times daily. (Patient taking differently: Take 17 g by mouth daily as needed for mild constipation. )  . potassium chloride SA (K-DUR,KLOR-CON) 20 MEQ tablet Take 10-20 mEq by mouth See admin instructions. Take 1 tablet (52mEq) by mouth in the morning; take 10 mEq by mouth in the evening  . senna-docusate (SENOKOT-S) 8.6-50 MG tablet Take 1 tablet by mouth at bedtime. (Patient taking differently: Take 1 tablet by mouth at bedtime as needed for mild constipation. )  . thiamine 100 MG tablet Take 1 tablet  (100 mg total) by mouth daily.  Marland Kitchen topiramate (TOPAMAX) 50 MG tablet Take 100 mg by mouth 2 (two) times daily.   . traZODone (DESYREL) 100 MG tablet Take 100 mg by mouth at bedtime.  . [DISCONTINUED] potassium chloride (K-DUR) 10 MEQ tablet Take 10 mEq by mouth every evening.     Allergies: Allergies as of 01/17/2018  . (No Known Allergies)   Past Medical History:  Diagnosis Date  . Anemia, unspecified 06/17/2013  . Headache   . History of laryngeal cancer 02/03/2013  . Hypertension   . Hypothyroidism (acquired) 06/17/2013  . Multiple lung nodules 06/28/2014  . Pneumonia 01/14/2014, 04/2017  . Poor oral hygiene 06/17/2013  . Rib pain 01/14/2014  . Rib pain on left side 01/05/2014  . Throat cancer [facility])     Family History: Mother with Diabetes, Ulcer and Hypertension   Social History: -Previous heavy alcohol use. Last use of EtOH 1 week ago. Usually drinks 2-40oz beer day -Current tobacco use. 1/2 pack per day  occasional THC use   Review of Systems: A complete ROS was negative except as per HPI.   Physical Exam: Blood pressure 102/78, pulse 87, temperature 98.6 F (37 C), temperature source Oral, resp. rate (!) 22, height 5\' 1"  (1.549 m), weight 37.2 kg, last menstrual period 10/20/2012, SpO2 100 %.  Physical Exam  Constitutional: She is well-developed, well-nourished, and in no distress. No distress.  HENT:  Head: Normocephalic and atraumatic.  Eyes: No scleral icterus.  Cardiovascular: Normal rate, regular rhythm and normal heart sounds. Exam reveals no gallop and no friction rub.  No murmur heard. Pulmonary/Chest: Effort normal and breath sounds normal. No respiratory distress. She has no wheezes. She has no rales.  Abdominal: Soft. Bowel sounds are normal. She exhibits no distension. There is tenderness (LUQ, epigastrium). There is no rebound and no guarding.  Neurological: She is alert.  Skin: Skin is warm. No rash noted. She is not diaphoretic. No erythema. No pallor.    Psychiatric: Mood and affect normal.    EKG: Consistent with sinus tachycardia  CXR: personally reviewed my interpretation is unremarkable  Assessment & Plan by Problem: Active Problems:   GI bleed  Ms. Shisler is a 42 year old woman with medical history significant for iron deficiency anemia, gastritis, duodenitis, previous GI bleed, hypertension, hypothyroidism, seizure disorder, laryngeal cancer diagnosed in 2012 s/p chemotherapy, radiation therapy and tracheostomy presenting with 2-day history of hematemesis.  Upper GI bleed: Patient with a 2-day history of hematemesis with concurrent gradually worsening left upper quadrant/epigastric pain.  Also endorses headache-chronic, dizziness, lightheadedness, palpitation.  Initial BP of 97/80, tachycardic to 122.  CBC with unremarkable hemoglobin, elevated RDW at 16.5.  CMP shows elevated BUN at 40.  Differential diagnosis include gastritis vs Mallory-Weiss tear vs peptic ulcer disease. Per history, she has chronic headaches and uses Tylenol p.m. or ibuprofen 800 mg daily.  Admitted in September 2014 for hypovolemic shock secondary  to upper GI bleed and successfully resuscitated with IV fluids and 4 units of PRBC. EGD at that time showed gastritis, duodenitis and a clean-based ulcer.  Repeat EGD in 10/2015 was unremarkable with normal esophagus, stomach, duodenum. -Appreciate gastroenterology reccs with anticipation of EGD &/or colonoscopy -s/p IV fluid bolus with maintenance -IV Protonix drip -Orthostatic vitals  -Telemetry -Follow-up a.m. CBC, PT-INR, PTT  Iron deficiency Anemia: Hgb currently stable. She has a documented intolerance to oral iron supplement. Her Oncologist Dr. Alvy Bimler has discussed IV iron infusion therapy.  Hypertension: Stable with range 97-127/79-92.  Hold home diltiazem to 240 mg daily, losartan 25 mg daily  Prerenal azotemia: Elevated BUN to creatinine ratio (40-1.05).   Baseline Cr. 0.68. In addition to elevated blood  products from hemolysis.  I suspect this will improve with ongoing IV hydration. -Follow-up a.m. BMP.  Hypokalemia: K+ of  3.1. S/p 70 mEq KDur   Hypothyroidism: Continue levothyroxine 25 mcg  Anxiety disorder: Continue Klonopin 1 mg qdaily prn anxiety, trazodone qhs  Alcohol use disorder: Last use of EtOH 1 week ago.  Usually drinks 2-40 ounce beer a day. -CIWA protocol without Ativan  Chronic migraine: Continue Topamax 100 mg twice daily    FEN: N.p.o., replace electrolytes as needed, NS bolus + maintenance at 125 mL/hour VTE prophylaxis: CODE STATUS: Full code  Dispo: Admit patient to inpatient with expected length of stay greater than 2 midnights.  Signed: Jean Rosenthal, MD 01/17/2018, 5:52 PM  Pager: [phone]

## 2018-01-17 NOTE — Progress Notes (Signed)
Call for a report

## 2018-01-17 NOTE — ED Notes (Signed)
Xray at bedside. EDP at bedside. °

## 2018-01-17 NOTE — ED Provider Notes (Addendum)
[place] EMERGENCY DEPARTMENT Provider Note   CSN: [id number] Arrival date & time: 01/17/18  1239     History   Chief Complaint Chief Complaint  Patient presents with  . Hematemesis    HPI Crystal Spence is a 42 y.o. female.  HPI   Patient is a 42 year old female with a history of laryngeal cancer, in remission, anemia, hypothyroidism, and hypertension presenting for hematemesis.  Patient reports that over the past 2 days, she has had increasing epigastric to periumbilical pain with one episode 2 days ago of clots of blood in her emesis as well as 2 episodes of "dark" and clot filled emesis yesterday.  Patient is also noting that she has not had a bowel movement several days, but has noticed that it is been more dark.  Patient denies any fevers, chills, chest pain, shortness of breath, dysuria, urgency, or frequency.  Patient does note that she has a history of a bleeding stomach ulcer.  Patient denies any ulcers requiring surgery.  Patient reports that last alcohol use was approximately week ago, and denies regular daily alcohol use.  Patient reports that she takes ibuprofen approximately twice a week for "migraines".  Past Medical History:  Diagnosis Date  . Anemia, unspecified 06/17/2013  . Headache   . History of laryngeal cancer 02/03/2013  . Hypertension   . Hypothyroidism (acquired) 06/17/2013  . Multiple lung nodules 06/28/2014  . Pneumonia 01/14/2014, 04/2017  . Poor oral hygiene 06/17/2013  . Rib pain 01/14/2014  . Rib pain on left side 01/05/2014  . Throat cancer [facility])     Patient Active Problem List   Diagnosis Date Noted  . Smoking 11/03/2017  . Seizure ([place])   . Essential hypertension 06/02/2017  . Positive pregnancy test 06/02/2017  . Bronchitis, acute   . Observed seizure-like activity ([place]) 06/01/2017  . Medication monitoring encounter 04/29/2016  . Protein-calorie malnutrition, severe 03/19/2016  . Chronic anemia   . Thrombocytosis ([place])     . Abscess of lower lobe of right lung with pneumonia ([place])   . Hypoxemia   . Poor dentition   . Pulmonary abscess ([place]) 03/17/2016  . Dehydration 03/17/2016  . Abnormal LFTs 12/14/2015  . Iron deficiency anemia 12/04/2015  . Multiple lung nodules 06/28/2014  . Other type of nonintractable migraine 06/28/2014  . Community acquired pneumonia of right lower lobe of lung ([place]) 01/14/2014  . Rib pain 01/14/2014  . Rib pain on left side 01/05/2014  . Unexplained weight loss 01/05/2014  . Anemia in chronic illness 06/17/2013  . Hypothyroidism (acquired) 06/17/2013  . Poor oral hygiene 06/17/2013  . Acute blood loss anemia 02/03/2013  . Syncope 02/03/2013  . Hyponatremia 02/03/2013  . History of laryngeal cancer 02/03/2013  . Hypokalemia 02/02/2013  . Alcohol abuse 02/02/2013  . Upper GI bleed 02/02/2013    Past Surgical History:  Procedure Laterality Date  . ABDOMINAL SURGERY    . COLONOSCOPY WITH PROPOFOL N/A 10/12/2015   Procedure: COLONOSCOPY WITH PROPOFOL;  Surgeon: Wonda Horner, MD;  Location: [facility] ENDOSCOPY;  Service: Endoscopy;  Laterality: N/A;  . ESOPHAGOGASTRODUODENOSCOPY N/A 02/03/2013   Procedure: ESOPHAGOGASTRODUODENOSCOPY (EGD);  Surgeon: Juanita Craver, MD;  Location: [facility]OS[facility];  Service: Endoscopy;  Laterality: N/A;  . ESOPHAGOGASTRODUODENOSCOPY N/A 10/12/2015   Procedure: ESOPHAGOGASTRODUODENOSCOPY (EGD);  Surgeon: Wonda Horner, MD;  Location: [facility];  Service: Endoscopy;  Laterality: N/A;  . 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   Procedure: TRACHEOCUTANEOUS FISTULA REPAIR;  Surgeon: Izora Gala, MD;  Location: [facility] OR;  Service: ENT;  Laterality: N/A;  . TRACHEOSTOMY     for throat inflammation from radiation  . TUBAL LIGATION       OB History   None      Home Medications    Prior to Admission medications   Medication Sig Start Date End Date Taking? Authorizing Provider  albuterol (PROVENTIL HFA;VENTOLIN HFA) 108 (90 Base) MCG/ACT inhaler  Inhale 2 puffs into the lungs every 2 (two) hours as needed for wheezing or shortness of breath (cough). 02/20/17   Ripley Fraise, MD  butalbital-acetaminophen-caffeine (FIORICET, ESGIC) [phone] MG tablet Take 1 tablet by mouth 3 (three) times daily as needed for headache or migraine.  07/28/15   [provider]  clonazePAM (KLONOPIN) 0.5 MG tablet Take 0.5 mg by mouth daily as needed for anxiety. 12/15/15   [provider]  fluticasone (FLONASE) 50 MCG/ACT nasal spray Place 2 sprays into both nostrils daily. 06/06/17   Eugenie Filler, MD  folic acid (FOLVITE) 1 MG tablet Take 1 tablet (1 mg total) by mouth daily. 06/06/17   Eugenie Filler, MD  HYDROcodone-homatropine [facility]) 5-1.5 MG/5ML syrup Take 5 mLs by mouth every 6 (six) hours as needed for cough. 06/05/17   Eugenie Filler, MD  levothyroxine (SYNTHROID, LEVOTHROID) 25 MCG tablet TAKE 1 TABLET(25 MCG) BY MOUTH DAILY BEFORE BREAKFAST 11/03/17   Alvy Bimler, Ni, MD  montelukast (SINGULAIR) 10 MG tablet TAKE 1 TABLET BY MOUTH AT BEDTIME 11/12/15   [provider]  Multiple Vitamin (MULTIVITAMIN WITH MINERALS) TABS tablet Take 1 tablet by mouth daily. 06/06/17   Eugenie Filler, MD  nicotine (NICODERM CQ - DOSED IN MG/24 HOURS) 21 mg/24hr patch Place 1 patch (21 mg total) onto the skin daily. 06/06/17   Eugenie Filler, MD  pantoprazole (PROTONIX) 40 MG tablet Take 1 tablet (40 mg total) by mouth daily. 06/06/17   Eugenie Filler, MD  polyethylene glycol [facility] Floria Raveling) packet Take 17 g by mouth 2 (two) times daily. 06/05/17   Eugenie Filler, MD  senna-docusate (SENOKOT-S) 8.6-50 MG tablet Take 1 tablet by mouth at bedtime. 06/05/17   Eugenie Filler, MD  thiamine 100 MG tablet Take 1 tablet (100 mg total) by mouth daily. 06/06/17   Eugenie Filler, MD  topiramate (TOPAMAX) 50 MG tablet Take 100 mg by mouth 2 (two) times daily.     [provider]  traZODone (DESYREL) 100 MG tablet Take 100  mg by mouth at bedtime. 08/15/15   [provider]    Family History Family History  Problem Relation Age of Onset  . Cancer Maternal Grandmother        skin cancer    Social History Social History   Tobacco Use  . Smoking status: Former Smoker    Packs/day: 1.00    Years: 15.00    Pack years: 15.00    Last attempt to quit: 09/11/2010    Years since quitting: 7.3  . Smokeless tobacco: Never Used  Substance Use Topics  . Alcohol use: Yes    Comment: rare  . Drug use: No    Comment: occasional     Allergies   No known allergies   Review of Systems Review of Systems  Constitutional: Positive for fatigue. Negative for chills and fever.  HENT: Negative for congestion and sore throat.   Eyes: Negative for visual disturbance.  Respiratory: Negative for cough, chest tightness and shortness of breath.   Cardiovascular: Negative  for chest pain, palpitations and leg swelling.  Gastrointestinal: Positive for abdominal pain, nausea and vomiting. Negative for diarrhea.  Genitourinary: Negative for dysuria and flank pain.  Musculoskeletal: Negative for back pain and myalgias.  Skin: Negative for rash.  Neurological: Negative for dizziness, syncope, light-headedness and headaches.     Physical Exam Updated Vital Signs BP (!) 106/92   Pulse (!) 120   Temp 98.6 F (37 C) (Oral)   Resp (!) 25   Ht 5\' 1"  (1.549 m)   Wt 37.2 kg   LMP 10/20/2012 Comment: pt. has had tubal ligation,no longer has peroids  SpO2 96%   BMI 15.49 kg/m   Physical Exam  Constitutional:  Thin, poorly nourished, and chronically ill-appearing.  HENT:  Head: Normocephalic and atraumatic.  Oral thrush noted on tongue.  Eyes: Pupils are equal, round, and reactive to light. Conjunctivae and EOM are normal.  Neck: Normal range of motion. Neck supple.  Cardiovascular: Normal rate, regular rhythm, S1 normal and S2 normal.  No murmur heard. Pulmonary/Chest: Effort normal and breath sounds normal.  She has no wheezes. She has no rales.  Abdominal: Soft. Bowel sounds are normal. She exhibits no distension. There is tenderness. There is no guarding.  Nonsurgical abdomen, but tenderness to palpation in a bandlike pattern across the epigastrium.  Genitourinary:  Genitourinary Comments: Rectal examination performed with RN chaperone present.  No external hemorrhoids or prolapsed internal hemorrhoids.  No masses or rectal vault.  Patient has soft, melenic stool present on exam.  Musculoskeletal: Normal range of motion. She exhibits no edema or deformity.  Neurological: She is alert.  Cranial nerves grossly intact. Patient moves extremities symmetrically and with good coordination.  Skin: Skin is warm and dry. No rash noted. No erythema.  Psychiatric: She has a normal mood and affect. Her behavior is normal. Judgment and thought content normal.  Nursing note and vitals reviewed.    ED Treatments / Results  Labs (all labs ordered are listed, but only abnormal results are displayed) Labs Reviewed  COMPREHENSIVE METABOLIC PANEL - Abnormal; Notable for the following components:      Result Value   Potassium 3.1 (*)    CO2 21 (*)    Glucose, Bld 125 (*)    BUN 40 (*)    Creatinine, Ser 1.05 (*)    Total Protein 5.3 (*)    Albumin 3.1 (*)    All other components within normal limits  CBC - Abnormal; Notable for the following components:   RDW 16.5 (*)    All other components within normal limits  POC OCCULT BLOOD, ED - Abnormal; Notable for the following components:   Fecal Occult Bld POSITIVE (*)    All other components within normal limits  I-STAT TROPONIN, ED  TYPE AND SCREEN  ABO/RH    EKG EKG Interpretation  Date/Time:  Saturday January 17 2018 12:44:39 EDT Ventricular Rate:  123 PR Interval:  148 QRS Duration: 74 QT Interval:  326 QTC Calculation: 466 R Axis:   77 Text Interpretation:  Sinus tachycardia Biatrial enlargement Marked ST abnormality, possible inferior  subendocardial injury , increased since last tracing Abnormal ECG Confirmed by Dorie Rank [phone]) on 01/17/2018 1:03:19 PM   Radiology Dg Chest 1 View  Result Date: 01/17/2018 CLINICAL DATA:  Possible GI bleeding. EXAM: CHEST  1 VIEW COMPARISON:  06/02/2017 FINDINGS: Cardiomediastinal silhouette is normal. Mediastinal contours appear intact. There is no evidence of focal airspace consolidation, pleural effusion or pneumothorax. Scatter lung granulomata noted. Scarring in  the right lower lobe. Osseous structures are without acute abnormality. Dextroconvex scoliosis of the thoracic spine. IMPRESSION: No active disease. Electronically Signed   By: Fidela Salisbury M.D.   On: 01/17/2018 13:42   Ct Abdomen Pelvis W Contrast  Result Date: 01/17/2018 CLINICAL DATA:  Diffuse abdominal pain for the past 2 days. Hematemesis. EXAM: CT ABDOMEN AND PELVIS WITH CONTRAST TECHNIQUE: Multidetector CT imaging of the abdomen and pelvis was performed using the standard protocol following bolus administration of intravenous contrast. CONTRAST:  158mL ISOVUE-300 IOPAMIDOL (ISOVUE-300) INJECTION 61% COMPARISON:  01/20/2017. FINDINGS: Lower chest: Small amount of linear atelectasis or scarring at both lung bases. Multiple bilateral calcified granulomata. Hepatobiliary: Diffuse low density of the liver relative to the spleen. 3 mm gallstone in the gallbladder. No gallbladder wall thickening or pericholecystic fluid. Pancreas: Unremarkable. No pancreatic ductal dilatation or surrounding inflammatory changes. Spleen: Multiple small calcified granulomata. Adrenals/Urinary Tract: Normal appearing adrenal glands. Small right renal cyst. Normal appearing left kidney, ureters and urinary bladder. Stomach/Bowel: Prominent stool in the rectum. Normal appearing stomach, small bowel and appendix. Vascular/Lymphatic: Mild arterial atheromatous changes. No enlarged lymph nodes. Reproductive: Uterus and bilateral adnexa are unremarkable. Other:  Stable tubal ligation clips in the right pelvis, not associated with the adnexae. Musculoskeletal: Unremarkable bones. IMPRESSION: 1. No acute abnormality. 2. Prominent stool in the rectum. 3. Diffuse hepatic steatosis. 4. Cholelithiasis. 5. Two tubal ligation clips in the right pelvis, not associated with the adnexae. Electronically Signed   By: Claudie Revering M.D.   On: 01/17/2018 15:57    Procedures Procedures (including critical care time)  CRITICAL CARE Performed by: Albesa Seen   Total critical care time: 35 minutes (GI bleed with initial tachycardia >120)  Critical care time was exclusive of separately billable procedures and treating other patients.  Critical care was necessary to treat or prevent imminent or life-threatening deterioration.  Critical care was time spent personally by me on the following activities: development of treatment plan with patient and/or surrogate as well as nursing, discussions with consultants, evaluation of patient's response to treatment, examination of patient, obtaining history from patient or surrogate, ordering and performing treatments and interventions, ordering and review of laboratory studies, ordering and review of radiographic studies, pulse oximetry and re-evaluation of patient's condition.   Medications Ordered in ED Medications  sodium chloride 0.9 % bolus 1,000 mL (has no administration in time range)    And  0.9 %  sodium chloride infusion (has no administration in time range)  pantoprazole (PROTONIX) 80 mg in sodium chloride 0.9 % 100 mL IVPB (has no administration in time range)  pantoprazole (PROTONIX) 80 mg in sodium chloride 0.9 % 250 mL (0.32 mg/mL) infusion (has no administration in time range)     Initial Impression / Assessment and Plan / ED Course  I have reviewed the triage vital signs and the nursing notes.  Pertinent labs & imaging results that were available during my care of the patient were reviewed by me and  considered in my medical decision making (see chart for details).  Clinical Course as of Jan 17 1645  Sat Jan 17, 2018  1512 Suggestive of acute upper GI bleed.  BUN(!): 40 [AM]  1512 Will replete.   Potassium(!): 3.1 [AM]  8657 Spoke with Dr. Penelope Coop of gastroenterology, who will see the patient on rounds tomorrow.  Appreciate his involvement in the care of this patient.   [AM]  1642 Spoke with Dr. Trilby Drummer (attending Dr. Daryll Drown) regarding admission.  Appreciate Dr.  Melvin's involvement in the care of this patient.   [AM]    Clinical Course User Index [AM] Albesa Seen, PA-C    Patient is nontoxic-appearing, but initially tachycardic on my examination.  Otherwise been hemodynamically stable with normal hemoglobin, however given acute GI bleed, this may drop.  Lab work demonstrating likely acute upper GI bleed with elevated BUN. Negative upright chest.  CT abdomen and pelvis pending.  Feel that likely source of patient's bleeding is likely peptic ulcer.  Will patient does have a history of alcohol use, patient had no evidence of esophageal varices and 2017 on EGD.  Patient with positive response to 1 L normal saline with resolution of tachycardia.  Will consult GI prior to admission.  Patient remained nontoxic on reevaluation's, ambulating the hall without difficulty.  Patient did receive a bolus infusion of Protonix.  CT abdomen and pelvis without any acute findings or evidence of free air.  Patient to be admitted for GI bleeding.   This is a shared visit with Dr. Dorie Rank. Patient was independently evaluated by this attending physician. Attending physician consulted in evaluation and admission management.  Final Clinical Impressions(s) / ED Diagnoses   Final diagnoses:  GI bleed  Melena  Hematemesis, presence of nausea not specified  Hypokalemia    ED Discharge Orders    None       Tamala Julian 01/17/18 1646    Dorie Rank, MD 01/17/18 1927    Albesa Seen, PA-C 01/28/18 Judithann Sauger    Dorie Rank, MD 01/28/18 1459

## 2018-01-17 NOTE — Progress Notes (Signed)
Admitted patient from E.D.Placed in telemetry bed comfortably,called and confirmed.Alert  And oriented x 4.Skin assessed with [place] Lathe.N.She has Stage I on her sacral area,non blanchable 1.5  cm x 1.5 cm. Pink foam dressing applied.

## 2018-01-17 NOTE — ED Triage Notes (Signed)
Pt presents with 2 day h/o abdominal pain and vomiting blood.  Pt reports emesis is black, reports some throat pain; denies any diarrhea, reports last bowel movement was 2 days ago.

## 2018-01-17 NOTE — ED Notes (Signed)
Attempted report 

## 2018-01-18 ENCOUNTER — Inpatient Hospital Stay (HOSPITAL_COMMUNITY): Payer: Medicaid Other | Admitting: Certified Registered Nurse Anesthetist

## 2018-01-18 ENCOUNTER — Encounter (HOSPITAL_COMMUNITY)
Admission: EM | Disposition: A | Discharge status: Home / Self Care | Payer: Self-pay | Source: Home / Self Care | Attending: Internal Medicine

## 2018-01-18 ENCOUNTER — Encounter (HOSPITAL_COMMUNITY): Payer: Self-pay

## 2018-01-18 DIAGNOSIS — L8990 Pressure ulcer of unspecified site, unspecified stage: Secondary | ICD-10-CM

## 2018-01-18 DIAGNOSIS — Z7289 Other problems related to lifestyle: Secondary | ICD-10-CM

## 2018-01-18 DIAGNOSIS — R9431 Abnormal electrocardiogram [ECG] [EKG]: Secondary | ICD-10-CM

## 2018-01-18 HISTORY — PX: ESOPHAGOGASTRODUODENOSCOPY (EGD) WITH PROPOFOL: SHX5813

## 2018-01-18 HISTORY — PX: BIOPSY: SHX5522

## 2018-01-18 LAB — BASIC METABOLIC PANEL
Anion gap: 5 (ref 5–15)
BUN: 28 mg/dL — AB (ref 6–20)
CHLORIDE: 119 mmol/L — AB (ref 98–111)
CO2: 20 mmol/L — ABNORMAL LOW (ref 22–32)
Calcium: 8.3 mg/dL — ABNORMAL LOW (ref 8.9–10.3)
Creatinine, Ser: 0.89 mg/dL (ref 0.44–1.00)
GFR calc Af Amer: >60 mL/min (ref >60)
GFR calc non Af Amer: >60 mL/min (ref >60)
GLUCOSE: 69 mg/dL — AB (ref 70–99)
POTASSIUM: 3.6 mmol/L (ref 3.5–5.1)
SODIUM: 144 mmol/L (ref 135–145)

## 2018-01-18 LAB — HEMOGLOBIN AND HEMATOCRIT, BLOOD
HCT: 25.2 % — ABNORMAL LOW (ref 36.0–46.0)
HEMOGLOBIN: 8.2 g/dL — AB (ref 12.0–15.0)

## 2018-01-18 LAB — CBC
HEMATOCRIT: 24.7 % — AB (ref 36.0–46.0)
Hemoglobin: 7.8 g/dL — ABNORMAL LOW (ref 12.0–15.0)
MCH: 30.4 pg (ref 26.0–34.0)
MCHC: 31.6 g/dL (ref 30.0–36.0)
MCV: 96.1 fL (ref 78.0–100.0)
Platelets: 113 10*3/uL — ABNORMAL LOW (ref 150–400)
RBC: 2.57 MIL/uL — ABNORMAL LOW (ref 3.87–5.11)
RDW: 16.7 % — AB (ref 11.5–15.5)
WBC: 2.6 10*3/uL — ABNORMAL LOW (ref 4.0–10.5)

## 2018-01-18 LAB — APTT: aPTT: 34 seconds (ref 24–36)

## 2018-01-18 LAB — PROTIME-INR
INR: 1.22
Prothrombin Time: 15.3 seconds — ABNORMAL HIGH (ref 11.4–15.2)

## 2018-01-18 SURGERY — ESOPHAGOGASTRODUODENOSCOPY (EGD) WITH PROPOFOL
Anesthesia: Monitor Anesthesia Care

## 2018-01-18 MED ORDER — SODIUM CHLORIDE 0.9 % IV SOLN
INTRAVENOUS | Status: DC
  Administered 2018-01-18: 07:00:00 via INTRAVENOUS

## 2018-01-18 MED ORDER — PROPOFOL 10 MG/ML IV BOLUS
INTRAVENOUS | Status: DC | PRN
  Administered 2018-01-18 (×4): 20 mg via INTRAVENOUS

## 2018-01-18 MED ORDER — PHENYLEPHRINE 40 MCG/ML (10ML) SYRINGE FOR IV PUSH (FOR BLOOD PRESSURE SUPPORT)
PREFILLED_SYRINGE | INTRAVENOUS | Status: DC | PRN
  Administered 2018-01-18: 80 ug via INTRAVENOUS

## 2018-01-18 MED ORDER — PROPOFOL 500 MG/50ML IV EMUL
INTRAVENOUS | Status: DC | PRN
  Administered 2018-01-18: 100 ug/kg/min via INTRAVENOUS

## 2018-01-18 MED ORDER — INFLUENZA VAC SPLIT QUAD 0.5 ML IM SUSY
0.5000 mL | PREFILLED_SYRINGE | Freq: Once | INTRAMUSCULAR | Status: AC
  Administered 2018-01-18: 0.5 mL via INTRAMUSCULAR
  Filled 2018-01-18: qty 0.5

## 2018-01-18 MED ORDER — HYDROMORPHONE HCL 1 MG/ML IJ SOLN: 0.1500 mg | Freq: Once | INTRAMUSCULAR | Status: DC

## 2018-01-18 SURGICAL SUPPLY — 14 items

## 2018-01-18 NOTE — Transfer of Care (Signed)
Immediate Anesthesia Transfer of Care Note  Patient: Crystal Spence  Procedure(s) Performed: ESOPHAGOGASTRODUODENOSCOPY (EGD) WITH PROPOFOL (N/A ) BIOPSY  Patient Location: Endoscopy Unit  Anesthesia Type:MAC  Level of Consciousness: awake, alert  and oriented  Airway & Oxygen Therapy: Patient Spontanous Breathing  Post-op Assessment: Report given to RN and Post -op Vital signs reviewed and stable  Post vital signs: Reviewed and stable  Last Vitals:  Vitals Value Taken Time  BP 99/70 01/18/2018 12:32 PM  Temp    Pulse 92 01/18/2018 12:32 PM  Resp 20 01/18/2018 12:32 PM  SpO2 97 % 01/18/2018 12:32 PM    Last Pain:  Vitals:   01/18/18 1144  TempSrc: Oral  PainSc: 7       Patients Stated Pain Goal: 0 (81/27/51 7001)  Complications: No apparent anesthesia complications

## 2018-01-18 NOTE — Consult Note (Signed)
Subjective:   HPI  The patient is a 42 year old female with a history of peptic ulcer disease. She presented to the hospital and subsequently admitted because of a 2 day history of hematemesis. She started to experience coffee-ground emesis a couple of days ago and then she also noticed melena. Hemoglobin and hematocrit have dropped.  Review of Systems Denies chest pain or shortness of breath  Past Medical History:  Diagnosis Date  . Anemia, unspecified 06/17/2013  . Headache   . History of laryngeal cancer 02/03/2013  . Hypertension   . Hypothyroidism (acquired) 06/17/2013  . Multiple lung nodules 06/28/2014  . Pneumonia 01/14/2014, 04/2017  . Poor oral hygiene 06/17/2013  . Rib pain 01/14/2014  . Rib pain on left side 01/05/2014  . Throat cancer [facility])    Past Surgical History:  Procedure Laterality Date  . ABDOMINAL SURGERY    . COLONOSCOPY WITH PROPOFOL N/A 10/12/2015   Procedure: COLONOSCOPY WITH PROPOFOL;  Surgeon: Wonda Horner, MD;  [facility];  Service: Endoscopy;  Laterality: N/A;  . ESOPHAGOGASTRODUODENOSCOPY N/A 02/03/2013   Procedure: ESOPHAGOGASTRODUODENOSCOPY (EGD);  Surgeon: Juanita Craver, MD;  Location: [facility];  Service: Endoscopy;  Laterality: N/A;  . ESOPHAGOGASTRODUODENOSCOPY N/A 10/12/2015   Procedure: ESOPHAGOGASTRODUODENOSCOPY (EGD);  Surgeon: Wonda Horner, MD;  Location: [facility];  Service: Endoscopy;  Laterality: N/A;  . 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   Procedure: TRACHEOCUTANEOUS FISTULA REPAIR;  Surgeon: Izora Gala, MD;  Location: [place];  Service: ENT;  Laterality: N/A;  . TRACHEOSTOMY     for throat inflammation from radiation  . TUBAL LIGATION     Social History   Socioeconomic History  . Marital status: Single    Spouse name: Not on file  . Number of children: Not on file  . Years of education: Not on file  . Highest education level: Not on file  Occupational History  . Not on file  Social Needs  . Financial resource  strain: Somewhat hard  . Food insecurity:    Worry: Not on file    Inability: Not on file  . Transportation needs:    Medical: No    Non-medical: No  Tobacco Use  . Smoking status: Former Smoker    Packs/day: 1.00    Years: 15.00    Pack years: 15.00    Last attempt to quit: 09/11/2010    Years since quitting: 7.3  . Smokeless tobacco: Never Used  Substance and Sexual Activity  . Alcohol use: Yes    Alcohol/week: 2.0 standard drinks    Types: 2 Cans of beer per week    Comment: rare  . Drug use: No    Comment: occasional  . Sexual activity: Never  Lifestyle  . Physical activity:    Days per week: Not on file    Minutes per session: Not on file  . Stress: Not on file  Relationships  . Social connections:    Talks on phone: Not on file    Gets together: Not on file    Attends religious service: Not on file    Active member of club or organization: Not on file    Attends meetings of clubs or organizations: Not on file    Relationship status: Not on file  . Intimate partner violence:    Fear of current or ex partner: No    Emotionally abused: No    Physically abused: No    Forced sexual activity: No  Other Topics Concern  . Not  on file  Social History Narrative  . Not on file   family history includes Cancer in her maternal grandmother.  Current Facility-Administered Medications:  .  0.9 %  sodium chloride infusion, 250 mL, Intravenous, PRN, Agyei, Obed K, MD .  0.9 %  sodium chloride infusion, , Intravenous, Continuous, Neva Seat, MD, Last Rate: 50 mL/hr at 01/18/18 0645 .  acetaminophen (TYLENOL) tablet 650 mg, 650 mg, Oral, Q6H PRN, 650 mg at 01/17/18 2050 **OR** acetaminophen (TYLENOL) suppository 650 mg, 650 mg, Rectal, Q6H PRN, Agyei, Obed K, MD .  albuterol (PROVENTIL) (2.5 MG/3ML) 0.083% nebulizer solution 3 mL, 3 mL, Inhalation, Q2H PRN, Agyei, Obed K, MD .  clonazePAM (KLONOPIN) tablet 1 mg, 1 mg, Oral, Daily PRN, Agyei, Obed K, MD, 1 mg at 01/17/18  2050 .  hydrOXYzine (ATARAX/VISTARIL) tablet 50 mg, 50 mg, Oral, QHS, Agyei, Obed K, MD, 50 mg at 01/17/18 2052 .  Influenza vac split quadrivalent PF (FLUARIX) injection 0.5 mL, 0.5 mL, Intramuscular, Once, Gilles Chiquito B, MD .  levothyroxine (SYNTHROID, LEVOTHROID) tablet 25 mcg, 25 mcg, Oral, QAC breakfast, Agyei, Obed K, MD .  pantoprazole (PROTONIX) 80 mg in sodium chloride 0.9 % 250 mL (0.32 mg/mL) infusion, 8 mg/hr, Intravenous, Continuous, Agyei, Obed K, MD, Last Rate: 25 mL/hr at 01/18/18 0213, 8 mg/hr at 01/18/18 0213 .  potassium chloride (K-DUR,KLOR-CON) CR tablet 10 mEq, 10 mEq, Oral, Q2200, Gilles Chiquito B, MD, 10 mEq at 01/17/18 2051 .  potassium chloride (K-DUR,KLOR-CON) CR tablet 20 mEq, 20 mEq, Oral, Daily, Agyei, Obed K, MD .  senna-docusate (Senokot-S) tablet 1 tablet, 1 tablet, Oral, QHS PRN, Agyei, Obed K, MD .  sodium chloride flush (NS) 0.9 % injection 3 mL, 3 mL, Intravenous, Q12H, Agyei, Obed K, MD .  sodium chloride flush (NS) 0.9 % injection 3 mL, 3 mL, Intravenous, Q12H, Agyei, Obed K, MD .  sodium chloride flush (NS) 0.9 % injection 3 mL, 3 mL, Intravenous, PRN, Agyei, Obed K, MD .  topiramate (TOPAMAX) tablet 100 mg, 100 mg, Oral, BID, Agyei, Obed K, MD, 100 mg at 01/17/18 2052 .  traZODone (DESYREL) tablet 100 mg, 100 mg, Oral, QHS, Agyei, Obed K, MD, 100 mg at 01/17/18 2052 No Known Allergies   Objective:     BP 92/67 (BP Location: Right Arm)   Pulse 75   Temp 98.5 F (36.9 C) (Oral)   Resp 18   Ht 5\' 1"  (1.549 m)   Wt 35.5 kg   LMP 10/20/2012 Comment: pt. has had tubal ligation,no longer has peroids  SpO2 100%   BMI 14.79 kg/m   No acute distress  Heart regular rhythm no murmurs  Lungs clear  Abdomen: Bowel sounds present, soft, mild epigastric tenderness  Laboratory No components found for: D1    Assessment:     Coffee-ground emesis, melena  History of peptic ulcer disease  Multiple other medical problems as noted      Plan:      PPI therapy. We will proceed with EGD to evaluate upper GI tract. Lab Results  Component Value Date   HGB 7.8 (L) 01/18/2018   HGB 12.5 01/17/2018   HGB 9.0 (L) 11/03/2017   HGB 10.3 (L) 12/14/2015   HGB 9.2 (L) 10/03/2015   HGB 9.6 (L) 08/26/2014   HCT 24.7 (L) 01/18/2018   HCT 38.7 01/17/2018   HCT 31.2 (L) 11/03/2017   HCT 34.1 (L) 12/14/2015   HCT 30.6 (L) 10/03/2015   HCT 32.8 (L) 08/26/2014  ALKPHOS 124 01/17/2018   ALKPHOS 144 11/03/2017   ALKPHOS 132 (H) 06/01/2017   ALKPHOS 178 (H) 12/14/2015   ALKPHOS 85 10/03/2015   ALKPHOS 131 06/28/2014   AST 31 01/17/2018   AST 39 (H) 11/03/2017   AST 35 06/01/2017   AST 107 (H) 12/14/2015   AST 20 10/03/2015   AST 28 06/28/2014   ALT 27 01/17/2018   ALT 15 11/03/2017   ALT 25 06/01/2017   ALT 27 12/14/2015   ALT <9 10/03/2015   ALT 14 06/28/2014

## 2018-01-18 NOTE — Op Note (Signed)
[facility] Patient Name: Crystal Spence Procedure Date : 01/18/2018 MRN: [id number] Attending MD: Wonda Horner , MD Date of Birth: 1975-12-18 CSN: [id number] Age: 42 Admit Type: Inpatient Procedure:                Upper GI endoscopy Indications:              Coffee-ground emesis, Melena Providers:                Wonda Horner, MD, Carolynn Comment RN, RN, Cherylynn Ridges, Technician, Clearnce Sorrel, CRNA Referring MD:              Medicines:                Propofol per Anesthesia Complications:            No immediate complications. Estimated Blood Loss:     Estimated blood loss: none. Procedure:                Pre-Anesthesia Assessment:                           - Prior to the procedure, a History and Physical                            was performed, and patient medications and                            allergies were reviewed. The patient's tolerance of                            previous anesthesia was also reviewed. The risks                            and benefits of the procedure and the sedation                            options and risks were discussed with the patient.                            All questions were answered, and informed consent                            was obtained. Prior Anticoagulants: The patient has                            taken no previous anticoagulant or antiplatelet                            agents. ASA Grade Assessment: III - A patient with                            severe systemic disease. After reviewing the risks  and benefits, the patient was deemed in                            satisfactory condition to undergo the procedure.                           After obtaining informed consent, the endoscope was                            passed under direct vision. Throughout the                            procedure, the patient's blood pressure, pulse, and        oxygen saturations were monitored continuously. The                            GIF-H190 ([id number]) Olympus adult EGD was introduced                            through the mouth, and advanced to the second part                            of duodenum. The upper GI endoscopy was                            accomplished without difficulty. The patient                            tolerated the procedure well. Scope In: Scope Out: Findings:      The examined esophagus was normal.      Two non-bleeding cratered gastric ulcers were found in the gastric       antrum. Largest about 2 cm long. Biopsies were taken with a cold forceps       for histology.      The examined duodenum was normal. Impression:               - Normal esophagus.                           - Non-bleeding gastric ulcers. Biopsied.                           - Normal examined duodenum. Recommendation:           - Clear liquid diet.                           - Continue present medications.                           - Await pathology results. Procedure Code(s):        --- Professional ---                           [phone], Esophagogastroduodenoscopy, flexible,  transoral; with biopsy, single or multiple Diagnosis Code(s):        --- Professional ---                           K25.9, Gastric ulcer, unspecified as acute or                            chronic, without hemorrhage or perforation                           K92.0, Hematemesis                           K92.1, Melena (includes Hematochezia) CPT copyright 2017 American Medical Association. All rights reserved. The codes documented in this report are preliminary and upon coder review may  be revised to meet current compliance requirements. Wonda Horner, MD 01/18/2018 12:36:37 PM This report has been signed electronically. Number of Addenda: 0

## 2018-01-18 NOTE — Anesthesia Preprocedure Evaluation (Addendum)
Anesthesia Evaluation  Patient identified by MRN, date of birth, ID band Patient awake    Reviewed: Allergy & Precautions, NPO status , Patient's Chart, lab work & pertinent test results  Airway Mallampati: II       Dental   Pulmonary pneumonia, former smoker,    breath sounds clear to auscultation       Cardiovascular hypertension,  Rhythm:Regular Rate:Normal     Neuro/Psych  Headaches, Seizures -,     GI/Hepatic negative GI ROS, Neg liver ROS,   Endo/Other  Hypothyroidism   Renal/GU negative Renal ROS     Musculoskeletal   Abdominal   Peds  Hematology  (+) anemia ,   Anesthesia Other Findings   Reproductive/Obstetrics                            Anesthesia Physical Anesthesia Plan  ASA: III  Anesthesia Plan: MAC   Post-op Pain Management:    Induction: Intravenous  PONV Risk Score and Plan: Treatment may vary due to age or medical condition and Ondansetron  Airway Management Planned: Simple Face Mask and Nasal Cannula  Additional Equipment:   Intra-op Plan:   Post-operative Plan:   Informed Consent: I have reviewed the patients History and Physical, chart, labs and discussed the procedure including the risks, benefits and alternatives for the proposed anesthesia with the patient or authorized representative who has indicated his/her understanding and acceptance.   Dental advisory given  Plan Discussed with: CRNA and Anesthesiologist  Anesthesia Plan Comments:         Anesthesia Quick Evaluation

## 2018-01-18 NOTE — Anesthesia Postprocedure Evaluation (Signed)
Anesthesia Post Note  Patient: Crystal Spence  Procedure(s) Performed: ESOPHAGOGASTRODUODENOSCOPY (EGD) WITH PROPOFOL (N/A ) BIOPSY     Patient location during evaluation: Endoscopy Anesthesia Type: MAC Level of consciousness: awake Pain management: pain level controlled Vital Signs Assessment: post-procedure vital signs reviewed and stable Respiratory status: spontaneous breathing Cardiovascular status: stable Postop Assessment: no apparent nausea or vomiting Anesthetic complications: no    Last Vitals:  Vitals:   01/18/18 1240 01/18/18 1249  BP: 126/78 132/87  Pulse: 84 78  Resp: 13 18  Temp:    SpO2: 98% 96%    Last Pain:  Vitals:   01/18/18 1249  TempSrc:   PainSc: 6                  Shawne Eskelson

## 2018-01-18 NOTE — H&P (Signed)
The patient was just seen in consultation a short while ago this morning. She presents to the endoscopy unit for EGD to evaluate hematemesis and melena. There has been no change in her history or physical since seen earlier this morning.

## 2018-01-18 NOTE — Progress Notes (Signed)
   Subjective:  Overnight: 1 episode of coffee ground emesis   Today, Ms Gillham was examined at bedside and reported of having 1 episode episode of coffee ground emesis. She denies headache, lightheadedness, dizziness, chest pain, palpitation, SOB. She however still endorses abdominal pain which is unchanged from yesterday.   Objective:  Vital signs in last 24 hours: Vitals:   01/17/18 1838 01/17/18 2031 01/18/18 0531 01/18/18 0924  BP: 125/87 102/84 93/79 92/67   Pulse: 83 85 74 75  Resp: 18 18 16 18   Temp: 99 F (37.2 C) 99.4 F (37.4 C) 98.5 F (36.9 C) 98.5 F (36.9 C)  TempSrc: Oral Oral Oral Oral  SpO2: 100% 100% 100%   Weight: 35.5 kg 35.5 kg    Height: 5\' 1"  (1.549 m)      Const: In NAD, sitting on the bed CV: RRR, no MGR Resp: CTABL, no wheezes, crackles, rhonchi  Abd: BS+, flat abdomen, TTP at LUQ and epigastrium   Assessment/Plan:  Active Problems:   GI bleed   Melena  Ms. Mausolf is a 42 year old woman with medical history significant for iron deficiency anemia, gastritis, duodenitis, previous GI bleed, hypertension, hypothyroidism, seizure disorder, laryngeal cancer diagnosed in 2012 s/p chemotherapy, radiation therapy and tracheostomy here with upper GI bleed.  Upper GI bleed: Reports of 1 subsequent episode of coffee-ground emesis but no associated headache, dizziness, CP or SOB. Hgb this morning at 7.8 << 12.5 due to constellation of fluid resuscitation vs ongoing upper GI bleed.  -Continue IV protonix drip  -EGD per GI  -Appreciate GI reccs  -Follow up AM CBC -Telemetry   EKG abnormality: Was noted to have ST depression on initial EKG and also previous EKGs. However, I was unable to appreciate any ST changed on the repeat EKG this morning. Also, she continues to deny CP, palpitation, SOB.   Iron deficiency Anemia: Hgb 7.8<<12.5. Intolerance to oral iron supplement. Her Oncologist Dr. Alvy Bimler has discussed IV iron infusion therapy.  Hypertension: BP this  AM 92/67. Hold home diltiazem to 240 mg daily, losartan 25 mg daily  Hypothyroidism: Hold levothyroxine 44mcg in preparation for EGD   Anxiety disorder: Continue Klonopin 1 mg qdaily prn anxiety, trazodone qhs  Alcohol use disorder: CIWA protocol without Ativan  Chronic migraine, seizure disorder: Continue Topamax 100 mg twice daily   FEN: N.p.o., replace electrolytes as needed, NS maintenance at 50 mL/hour VTE prophylaxis: SCDs CODE STATUS: Full code  Dispo: Anticipated discharge once clinically stable.   Jean Rosenthal, MD 01/18/2018, 11:15 AM Pager: [phone] IMTS PGY-1

## 2018-01-19 ENCOUNTER — Encounter (HOSPITAL_COMMUNITY): Payer: Self-pay | Admitting: Gastroenterology

## 2018-01-19 LAB — CBC
HEMATOCRIT: 23.1 % — AB (ref 36.0–46.0)
HEMOGLOBIN: 7.5 g/dL — AB (ref 12.0–15.0)
MCH: 31.8 pg (ref 26.0–34.0)
MCHC: 32.5 g/dL (ref 30.0–36.0)
MCV: 97.9 fL (ref 78.0–100.0)
Platelets: 98 10*3/uL — ABNORMAL LOW (ref 150–400)
RBC: 2.36 MIL/uL — AB (ref 3.87–5.11)
RDW: 17.2 % — AB (ref 11.5–15.5)
WBC: 2.3 10*3/uL — AB (ref 4.0–10.5)

## 2018-01-19 MED ORDER — PANTOPRAZOLE SODIUM 40 MG PO TBEC
40.0000 mg | DELAYED_RELEASE_TABLET | Freq: Two times a day (BID) | ORAL | Status: DC
  Administered 2018-01-19: 40 mg via ORAL
  Filled 2018-01-19: qty 1

## 2018-01-19 MED ORDER — PANTOPRAZOLE SODIUM 40 MG PO TBEC: 40.0000 mg | DELAYED_RELEASE_TABLET | Freq: Every day | ORAL | 3 refills | Status: DC

## 2018-01-19 NOTE — Progress Notes (Signed)
[facility] Progress Note  Subjective: Patient is doing well today. Hungry. No further signs of GI bleeding.  Objective: Vital signs in last 24 hours: Temp:  [98 F (36.7 C)-98.6 F (37 C)] 98.2 F (36.8 C) (09/09 0755) Pulse Rate:  [73-92] 73 (09/09 0755) Resp:  [13-20] 20 (09/09 0755) BP: (99-132)/(70-90) 114/83 (09/09 0755) SpO2:  [96 %-100 %] 100 % (09/09 0755) Weight:  [37.2 kg] 37.2 kg (09/08 2116) Weight change: 0 kg   PE:  No distress  Heart regular rhythm  Abdomen soft nontender  Lab Results: Results for orders placed or performed during the hospital encounter of 01/17/18 (from the past 24 hour(s))  Hemoglobin and hematocrit, blood     Status: Abnormal   Collection Time: 01/18/18  6:53 PM  Result Value Ref Range   Hemoglobin 8.2 (L) 12.0 - 15.0 g/dL   HCT 25.2 (L) 36.0 - 46.0 %  CBC     Status: Abnormal   Collection Time: 01/19/18  7:33 AM  Result Value Ref Range   WBC 2.3 (L) 4.0 - 10.5 K/uL   RBC 2.36 (L) 3.87 - 5.11 MIL/uL   Hemoglobin 7.5 (L) 12.0 - 15.0 g/dL   HCT 23.1 (L) 36.0 - 46.0 %   MCV 97.9 78.0 - 100.0 fL   MCH 31.8 26.0 - 34.0 pg   MCHC 32.5 30.0 - 36.0 g/dL   RDW 17.2 (H) 11.5 - 15.5 %   Platelets 98 (L) 150 - 400 K/uL    Studies/Results: No results found.    Assessment: GI bleed characterized by hematemesis and melena and found to have a gastric antral ulcer on EGD yesterday. Currently stable.  Plan:   Continue PPI therapy. Advanced diet. If she is doing well and can tolerate diet she can go home on oral PPI therapy. She needs to avoid NSAIDs.    SAM F Dev Dhondt 01/19/2018, 10:55 AM  Pager: [phone] If no answer or after 5 PM call [phone] Lab Results  Component Value Date   HGB 7.5 (L) 01/19/2018   HGB 8.2 (L) 01/18/2018   HGB 7.8 (L) 01/18/2018   HGB 10.3 (L) 12/14/2015   HGB 9.2 (L) 10/03/2015   HGB 9.6 (L) 08/26/2014   HCT 23.1 (L) 01/19/2018   HCT 25.2 (L) 01/18/2018   HCT 24.7 (L) 01/18/2018   HCT 34.1 (L) 12/14/2015   HCT 30.6 (L) 10/03/2015   HCT 32.8 (L) 08/26/2014   ALKPHOS 124 01/17/2018   ALKPHOS 144 11/03/2017   ALKPHOS 132 (H) 06/01/2017   ALKPHOS 178 (H) 12/14/2015   ALKPHOS 85 10/03/2015   ALKPHOS 131 06/28/2014   AST 31 01/17/2018   AST 39 (H) 11/03/2017   AST 35 06/01/2017   AST 107 (H) 12/14/2015   AST 20 10/03/2015   AST 28 06/28/2014   ALT 27 01/17/2018   ALT 15 11/03/2017   ALT 25 06/01/2017   ALT 27 12/14/2015   ALT <9 10/03/2015   ALT 14 06/28/2014

## 2018-01-19 NOTE — Progress Notes (Signed)
Crystal Spence to be D/C'd Home per MD order.  Discussed prescriptions and follow up appointments with the patient. Prescriptions given to patient, medication list explained in detail. Pt verbalized understanding.  Allergies as of 01/19/2018   No Known Allergies     Medication List    STOP taking these medications   ibuprofen 800 MG tablet Commonly known as:  ADVIL,MOTRIN     TAKE these medications   albuterol 108 (90 Base) MCG/ACT inhaler Commonly known as:  PROVENTIL HFA;VENTOLIN HFA Inhale 2 puffs into the lungs every 2 (two) hours as needed for wheezing or shortness of breath (cough).   clonazePAM 1 MG tablet Commonly known as:  KLONOPIN Take 1 mg by mouth daily as needed for anxiety.   diltiazem 240 MG 24 hr capsule Commonly known as:  CARDIZEM CD Take 240 mg by mouth daily.   fluticasone 50 MCG/ACT nasal spray Commonly known as:  FLONASE Place 2 sprays into both nostrils daily.   folic acid 1 MG tablet Commonly known as:  FOLVITE Take 1 tablet (1 mg total) by mouth daily.   HYDROcodone-homatropine 5-1.5 MG/5ML syrup Commonly known as:  HYCODAN Take 5 mLs by mouth every 6 (six) hours as needed for cough.   hydrOXYzine 50 MG capsule Commonly known as:  VISTARIL Take 50 mg by mouth at bedtime.   levothyroxine 25 MCG tablet Commonly known as:  SYNTHROID, LEVOTHROID TAKE 1 TABLET(25 MCG) BY MOUTH DAILY BEFORE BREAKFAST What changed:  See the new instructions.   losartan 25 MG tablet Commonly known as:  COZAAR Take 25 mg by mouth daily.   multivitamin with minerals Tabs tablet Take 1 tablet by mouth daily.   nicotine 21 mg/24hr patch Commonly known as:  NICODERM CQ - dosed in mg/24 hours Place 1 patch (21 mg total) onto the skin daily.   pantoprazole 40 MG tablet Commonly known as:  PROTONIX Take 1 tablet (40 mg total) by mouth daily.   polyethylene glycol packet Commonly known as:  MIRALAX / GLYCOLAX Take 17 g by mouth 2 (two) times daily. What  changed:    when to take this  reasons to take this   potassium chloride SA 20 MEQ tablet Commonly known as:  K-DUR,KLOR-CON Take 10-20 mEq by mouth See admin instructions. Take 1 tablet (72mEq) by mouth in the morning; take 10 mEq by mouth in the evening   senna-docusate 8.6-50 MG tablet Commonly known as:  Senokot-S Take 1 tablet by mouth at bedtime. What changed:    when to take this  reasons to take this   thiamine 100 MG tablet Take 1 tablet (100 mg total) by mouth daily.   topiramate 50 MG tablet Commonly known as:  TOPAMAX Take 100 mg by mouth 2 (two) times daily.   traZODone 100 MG tablet Commonly known as:  DESYREL Take 100 mg by mouth at bedtime.       Vitals:   01/19/18 0531 01/19/18 0755  BP: 103/73 114/83  Pulse: 74 73  Resp: 16 20  Temp: 98.4 F (36.9 C) 98.2 F (36.8 C)  SpO2: 98% 100%    Skin clean, dry and intact without evidence of skin break down, no evidence of skin tears noted. IV catheter discontinued intact. Site without signs and symptoms of complications. Dressing and pressure applied. Pt denies pain at this time. No complaints noted.  An After Visit Summary was printed and given to the patient. Patient escorted via [place], and D/C home via private auto.  Dixie Dials RN, BSN

## 2018-01-19 NOTE — Progress Notes (Signed)
  Date: 01/19/2018  Patient name: Crystal Spence  Medical record number: [id number]  Date of birth: [birth year]   I have seen and evaluated this patient and I have discussed the plan of care with the house staff. Please see their note for complete details. I concur with their findings with the following additions/corrections: Non bleeding antral ulcer, HgB stable, vitals stable, no further bleeding. Stable for D/C home on PO PPI. Told not to take any NSAIDs and given examples (IBU, motrin, etc).  Bartholomew Crews, MD 01/19/2018, 1:41 PM

## 2018-01-19 NOTE — Discharge Summary (Signed)
Name: Crystal Spence MRN: [id number] DOB: [birth year] 42 y.o. PCP: Trey Sailors, PA  Date of Admission: 01/17/2018 12:41 PM Date of Discharge: 01/19/2018 Attending Physician: No att. providers found  Discharge Diagnosis: 1. Upper GI Bleed 2. Iron deficiency anemia  3. Hypertension  4. Hypokalemia  5. Chronic migraine  6. Anxiety disorder   Discharge Medications: Allergies as of 01/19/2018   No Known Allergies     Medication List    STOP taking these medications   ibuprofen 800 MG tablet Commonly known as:  ADVIL,MOTRIN     TAKE these medications   albuterol 108 (90 Base) MCG/ACT inhaler Commonly known as:  PROVENTIL HFA;VENTOLIN HFA Inhale 2 puffs into the lungs every 2 (two) hours as needed for wheezing or shortness of breath (cough).   clonazePAM 1 MG tablet Commonly known as:  KLONOPIN Take 1 mg by mouth daily as needed for anxiety.   diltiazem 240 MG 24 hr capsule Commonly known as:  CARDIZEM CD Take 240 mg by mouth daily.   fluticasone 50 MCG/ACT nasal spray Commonly known as:  FLONASE Place 2 sprays into both nostrils daily.   folic acid 1 MG tablet Commonly known as:  FOLVITE Take 1 tablet (1 mg total) by mouth daily.   HYDROcodone-homatropine 5-1.5 MG/5ML syrup Commonly known as:  HYCODAN Take 5 mLs by mouth every 6 (six) hours as needed for cough.   hydrOXYzine 50 MG capsule Commonly known as:  VISTARIL Take 50 mg by mouth at bedtime.   levothyroxine 25 MCG tablet Commonly known as:  SYNTHROID, LEVOTHROID TAKE 1 TABLET(25 MCG) BY MOUTH DAILY BEFORE BREAKFAST What changed:  See the new instructions.   losartan 25 MG tablet Commonly known as:  COZAAR Take 25 mg by mouth daily.   multivitamin with minerals Tabs tablet Take 1 tablet by mouth daily.   nicotine 21 mg/24hr patch Commonly known as:  NICODERM CQ - dosed in mg/24 hours Place 1 patch (21 mg total) onto the skin daily.   pantoprazole 40 MG tablet Commonly known as:   PROTONIX Take 1 tablet (40 mg total) by mouth daily.   polyethylene glycol packet Commonly known as:  MIRALAX / GLYCOLAX Take 17 g by mouth 2 (two) times daily. What changed:    when to take this  reasons to take this   potassium chloride SA 20 MEQ tablet Commonly known as:  K-DUR,KLOR-CON Take 10-20 mEq by mouth See admin instructions. Take 1 tablet (1mEq) by mouth in the morning; take 10 mEq by mouth in the evening   senna-docusate 8.6-50 MG tablet Commonly known as:  Senokot-S Take 1 tablet by mouth at bedtime. What changed:    when to take this  reasons to take this   thiamine 100 MG tablet Take 1 tablet (100 mg total) by mouth daily.   topiramate 50 MG tablet Commonly known as:  TOPAMAX Take 100 mg by mouth 2 (two) times daily.   traZODone 100 MG tablet Commonly known as:  DESYREL Take 100 mg by mouth at bedtime.       Disposition and follow-up:   Ms.Celenia JURNI CESARO was discharged from [facility] in [place] condition.  At the hospital follow up visit please address:  1.  Upper GI bleed: Ensure adherence to pantoprazole.       Avoid these medication below  Aspirin celecoxib (Celebrex) diclofenac (Cambia, Cataflam, Voltaren-XR, Zipsor, Zorvolex) ibuprofen (Motrin, Advil) indomethacin (Indocin) ketorolac (Toradol - discontinued brand) naproxen (Aleve, Anaprox, Naprelan,  Naprosyn) oxaprozin (Daypro) piroxicam (Feldene)  Iron deficiency anemia: Intolerance to oral iron. Require follow up with oncologist for iron infusion.   Hypokalemia: K+ on admission of 3.1 and received KDur 78mEq with repeat K+ of 3.6  2.  Labs / imaging needed at time of follow-up: CBC, BMP  3.  Pending labs/ test needing follow-up: None, biopsy from EGD.  Follow-up Appointments: PCP at [facility] [place] Course by problem list: 1. Upper GI bleed:       Ms. 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 She also endorsed headache which she reports is chronic, dizziness, lightheadedness and palpitation. On arrival, her initial BP was 97/80. She was tachycardic to 122, CBC had unremarkable hemoglobin with elevated RDW at 16.5.  CMP was consistent with elevated BUN at 40. Given suspicion of GI bleed, she was started on a Protonix drip and gastroenterology was consulted. She underwent an EGD with biopsy which showed non-bleeding gastric ulcers. She remained stable with no subsequent hematemesis, melena, lightheadedness or dizziness. She tolerated clear liquid diet which was advanced to soft prior to discharge. She was discharged with Protonix 40mg  and was asked to have close follow up with her PCP.   Of note, she presented to the emergency department in September 2014 with upper GI bleed.  She was found to be in hypovolemic shock as her hemoglobin was at 3.7 and hypotensive.  She was fluid resuscitated and received 4 units of packed red blood cells.  This episode of upper GI bleed was precipitated by her daily use of Excedrin for chronic migraine.  EGD at that time showed gastritis, duodenitis and a clean-based ulcer. However, repeat EGD in 10/2015 was unremarkable with normal esophagus, stomach, duodenum.    Iron deficiency Anemia:Ms. Ardis Hughs follows up with Dr. Alvy Bimler (Oncologist) who is well aware of her anemia. Per his note, she has Intolerance to oral iron supplement and have there have been a discussion of IV iron infusion therapy.   Hypertension:Ms. Blowers initial blood pressure on presentation was low at  92/67 but however remained normotensive after fluid recusitation. Her home antihypertensives  (diltiazem to  240 mg daily,losartan 25 mg daily) were held.   Hypothyroidism: Stable on Levothyroxine 33mcg.   Anxiety disorder:We continued her home Klonopin and trazodone at night.  Alcohol use disorder: Mr. Schuelke reported of daily alcohol use. Says that she drinks about 2-40 oz beer per day but her last alcohol use was 1 week prior to admission. She was placed on CIWA protocol without ativan and she denied previous symptoms of withdrawal.   Chronic migraine, seizure disorder: We continued her home Topamax 100 mg twice daily  Discharge Vitals:   BP 114/83 (BP Location: Right Arm)   Pulse 73   Temp 98.2 F (36.8 C) (Oral)   Resp 20   Ht 5\' 1"  (1.549 m)   Wt 37.2 kg   LMP 10/20/2012 Comment: pt. has had tubal ligation,no longer has peroids  SpO2 100%   BMI 15.50 kg/m   Pertinent Labs, Studies, and Procedures:  EGD 01/18/2018 - Normal esophagus. - Non-bleeding gastric ulcers. Biopsied. - Normal examined duodenum.  CBC Latest Ref Rng & Units 01/19/2018 01/18/2018 01/18/2018  WBC 4.0 - 10.5 K/uL 2.3(L) - 2.6(L)  Hemoglobin 12.0 -  15.0 g/dL 7.5(L) 8.2(L) 7.8(L)  Hematocrit 36.0 - 46.0 % 23.1(L) 25.2(L) 24.7(L)  Platelets 150 - 400 K/uL 98(L) - 113(L)   BMP Latest Ref Rng & Units 01/18/2018 01/17/2018 11/03/2017  Glucose 70 - 99 mg/dL 69(L) 125(H) 68(L)  BUN 6 - 20 mg/dL 28(H) 40(H) 6(L)  Creatinine 0.44 - 1.00 mg/dL 0.89 1.05(H) 0.68  Sodium 135 - 145 mmol/L 144 138 140  Potassium 3.5 - 5.1 mmol/L 3.6 3.1(L) 3.5  Chloride 98 - 111 mmol/L 119(H) 107 107  CO2 22 - 32 mmol/L 20(L) 21(L) 20(L)  Calcium 8.9 - 10.3 mg/dL 8.3(L) 9.4 9.7    Discharge Instructions: Discharge Instructions    Call MD for:  difficulty breathing, headache or visual disturbances   Complete by:  As directed    Call MD for:  extreme fatigue   Complete by:  As directed    Call MD for:  persistant dizziness or light-headedness   Complete by:  As directed    Call MD for:  persistant nausea and vomiting   Complete by:  As  directed    Call MD for:  redness, tenderness, or signs of infection (pain, swelling, redness, odor or green/yellow discharge around incision site)   Complete by:  As directed    Call MD for:  severe uncontrolled pain   Complete by:  As directed    Call MD for:  temperature >100.4   Complete by:  As directed    Diet - low sodium heart healthy   Complete by:  As directed    Discharge instructions   Complete by:  As directed    Ms. Hakim,   It was a pleasure taking care of you here at the hospital. You were admitted because of bleeding from your intestine. We treated you with medications to protect your stomach and also we did a procedure on you called an EGD. It showed that you have ulcers in your stomach but they were not bleeding.   Please follow up with your primary care provider at [facility] in 1 week. I have discharged you with a medication called Protonix. You will take one pill a day with a glass of water   Avoid these medications, aspirin celecoxib (Celebrex) diclofenac (Cambia, Cataflam, Voltaren-XR, Zipsor, Zorvolex) ibuprofen (Motrin, Advil) indomethacin (Indocin) ketoprofen (Active-Ketoprofen [Orudis - discontinued brand]) ketorolac (Toradol - discontinued brand) naproxen (Aleve, Anaprox, Naprelan, Naprosyn) oxaprozin (Daypro) piroxicam (Feldene)  ~Take Care Dr. Eileen Stanford   Increase activity slowly   Complete by:  As directed       Signed: Jean Rosenthal, MD 01/19/2018, 3:18 PM   Pager: [phone] IMTS PGY-1

## 2018-01-19 NOTE — Progress Notes (Signed)
   Subjective:   Today, [name] was examined at bedside and reports that she is doing well. She currently denies hematemesis or nausea. Her abdominal pain has improved, tolerating her diet and also ambulating with no difficulty.     Objective:  Vital signs in last 24 hours: Vitals:   01/18/18 1654 01/18/18 2116 01/19/18 0531 01/19/18 0755  BP: 128/75 113/79 103/73 114/83  Pulse: 76 83 74 73  Resp: 18 18 16 20   Temp: 98.2 F (36.8 C) 98.2 F (36.8 C) 98.4 F (36.9 C) 98.2 F (36.8 C)  TempSrc: Oral Oral Oral Oral  SpO2: 98% 100% 98% 100%  Weight:  37.2 kg    Height:       Const: In NAD, lying in bed CV: RRR, no MGR Resp: CTABL, no wheezes, crackles, rhochi  Abd: BS+, NTTP at all 4 quadrants.   Assessment/Plan:  Active Problems:   GI bleed   Melena   Pressure injury of skin  [name] a 42 year old woman with medical history significant for iron deficiency anemia, gastritis, duodenitis, previous GI bleed, hypertension, hypothyroidism, seizure disorder,laryngeal cancer diagnosed in 2012 s/pchemotherapy, radiation therapy and tracheostomy here with upper GI bleed s/p EGD  Upper GI bleed: Underwent EGD on 01/18/17 which showed non-bleeding antral gastric ulcer. Currently denies hematemesis or nausea. Also reports of improvement to her abdominal pain. Hgb stable at 8.2 <<7.8.  -Advance diet as tolerated -Continue IV PPI per GI and discharge on PO PPI once stable  Hypertension:BP Stable at 114/83. Hold home diltiazem to 240 mg daily,losartan 25 mg daily  Hypothyroidism: Continue levothyroxine 88mcg in preparation for EGD   Anxiety disorder:Continue Klonopin 1 mgqdaily prnanxiety,trazodoneqhs  Alcohol use disorder: CIWA protocol without Ativan  Chronic migraine, seizure disorder: Continue Topamax 100 mg twice daily  Dispo: Anticipated discharge today once able to tolerate diet.   Jean Rosenthal, MD 01/19/2018, 9:56 AM Pager: [phone] I[facility] PGY-1

## 2018-02-02 ENCOUNTER — Other Ambulatory Visit: Payer: Self-pay | Admitting: Hematology and Oncology

## 2018-02-02 DIAGNOSIS — E039 Hypothyroidism, unspecified: Secondary | ICD-10-CM

## 2018-02-02 DIAGNOSIS — Z8521 Personal history of malignant neoplasm of larynx: Secondary | ICD-10-CM

## 2018-02-02 DIAGNOSIS — D509 Iron deficiency anemia, unspecified: Secondary | ICD-10-CM

## 2018-02-03 ENCOUNTER — Telehealth: Payer: Self-pay | Admitting: Hematology and Oncology

## 2018-02-03 ENCOUNTER — Inpatient Hospital Stay: Payer: Medicaid Other

## 2018-02-03 ENCOUNTER — Other Ambulatory Visit: Payer: Self-pay | Admitting: Hematology and Oncology

## 2018-02-03 ENCOUNTER — Other Ambulatory Visit: Payer: Self-pay

## 2018-02-03 ENCOUNTER — Inpatient Hospital Stay: Payer: Medicaid Other | Attending: Hematology and Oncology | Admitting: Hematology and Oncology

## 2018-02-03 DIAGNOSIS — Z8521 Personal history of malignant neoplasm of larynx: Secondary | ICD-10-CM | POA: Diagnosis not present

## 2018-02-03 DIAGNOSIS — F1010 Alcohol abuse, uncomplicated: Secondary | ICD-10-CM

## 2018-02-03 DIAGNOSIS — R1310 Dysphagia, unspecified: Secondary | ICD-10-CM | POA: Diagnosis not present

## 2018-02-03 DIAGNOSIS — E039 Hypothyroidism, unspecified: Secondary | ICD-10-CM | POA: Diagnosis not present

## 2018-02-03 DIAGNOSIS — D509 Iron deficiency anemia, unspecified: Secondary | ICD-10-CM | POA: Diagnosis not present

## 2018-02-03 DIAGNOSIS — D72819 Decreased white blood cell count, unspecified: Secondary | ICD-10-CM | POA: Diagnosis not present

## 2018-02-03 DIAGNOSIS — F17200 Nicotine dependence, unspecified, uncomplicated: Secondary | ICD-10-CM | POA: Diagnosis not present

## 2018-02-03 LAB — CBC WITH DIFFERENTIAL/PLATELET
BASOS ABS: 0 10*3/uL (ref 0.0–0.1)
Basophils Relative: 1 %
Eosinophils Absolute: 0.1 10*3/uL (ref 0.0–0.5)
Eosinophils Relative: 2 %
HEMATOCRIT: 34.3 % — AB (ref 34.8–46.6)
Hemoglobin: 11.3 g/dL — ABNORMAL LOW (ref 11.6–15.9)
LYMPHS PCT: 15 %
Lymphs Abs: 0.5 10*3/uL — ABNORMAL LOW (ref 0.9–3.3)
MCH: 33.1 pg (ref 25.1–34.0)
MCHC: 33 g/dL (ref 31.5–36.0)
MCV: 100.5 fL (ref 79.5–101.0)
MONOS PCT: 7 %
Monocytes Absolute: 0.2 10*3/uL (ref 0.1–0.9)
NEUTROS ABS: 2.5 10*3/uL (ref 1.5–6.5)
Neutrophils Relative %: 75 %
Platelets: 230 10*3/uL (ref 145–400)
RBC: 3.41 MIL/uL — ABNORMAL LOW (ref 3.70–5.45)
RDW: 20.7 % — AB (ref 11.2–14.5)
WBC: 3.4 10*3/uL — ABNORMAL LOW (ref 3.9–10.3)

## 2018-02-03 LAB — COMPREHENSIVE METABOLIC PANEL
ALK PHOS: 163 U/L — AB (ref 38–126)
ALT: 25 U/L (ref 0–44)
AST: 45 U/L — AB (ref 15–41)
Albumin: 3.5 g/dL (ref 3.5–5.0)
Anion gap: 11 (ref 5–15)
BILIRUBIN TOTAL: 0.4 mg/dL (ref 0.3–1.2)
BUN: 3 mg/dL — AB (ref 6–20)
CALCIUM: 9.1 mg/dL (ref 8.9–10.3)
CO2: 22 mmol/L (ref 22–32)
CREATININE: 0.71 mg/dL (ref 0.44–1.00)
Chloride: 109 mmol/L (ref 98–111)
GFR calc Af Amer: >60 mL/min (ref >60)
Glucose, Bld: 68 mg/dL — ABNORMAL LOW (ref 70–99)
Potassium: 3.6 mmol/L (ref 3.5–5.1)
Sodium: 142 mmol/L (ref 135–145)
TOTAL PROTEIN: 6.1 g/dL — AB (ref 6.5–8.1)

## 2018-02-03 LAB — IRON AND TIBC
IRON: 34 ug/dL — AB (ref 41–142)
Saturation Ratios: 19 % — ABNORMAL LOW (ref 21–57)
TIBC: 183 ug/dL — ABNORMAL LOW (ref 236–444)
UIBC: 148 ug/dL

## 2018-02-03 LAB — FERRITIN: FERRITIN: 140 ng/mL (ref 11–307)

## 2018-02-03 LAB — TSH: TSH: 0.264 u[IU]/mL — AB (ref 0.308–3.960)

## 2018-02-03 LAB — T4, FREE: FREE T4: 1.68 ng/dL (ref 0.82–1.77)

## 2018-02-03 MED ORDER — NYSTATIN 100000 UNIT/ML MT SUSP: 5.0000 mL | Freq: Four times a day (QID) | OROMUCOSAL | 0 refills | Status: AC

## 2018-02-03 NOTE — Telephone Encounter (Signed)
Gave pt avs and calendar  °

## 2018-02-04 ENCOUNTER — Encounter: Payer: Self-pay | Admitting: Hematology and Oncology

## 2018-02-04 ENCOUNTER — Telehealth: Payer: Self-pay

## 2018-02-04 DIAGNOSIS — R1310 Dysphagia, unspecified: Secondary | ICD-10-CM | POA: Insufficient documentation

## 2018-02-04 DIAGNOSIS — R1312 Dysphagia, oropharyngeal phase: Secondary | ICD-10-CM | POA: Insufficient documentation

## 2018-02-04 NOTE — Assessment & Plan Note (Signed)
The patient has resumed smoking habit She is made aware about risk of cancer recurrence She is attempting to quit on her own

## 2018-02-04 NOTE — Assessment & Plan Note (Signed)
She is cancer free from disease standpoint I have advised the patient to stop smoking and drinking as these are risk factors associated with cancer recurrence She will continue close follow-up with ENT service.

## 2018-02-04 NOTE — Telephone Encounter (Signed)
-----   Message from Heath Lark, MD sent at 02/03/2018  5:34 PM EDT ----- Regarding: iron studies are ok Let her know iron studies are improving Continue multivitamin supplement ----- Message ----- From: Interface, [facility] In [place] Sent: 02/03/2018  12:15 PM EDT To: Heath Lark, MD

## 2018-02-04 NOTE — Assessment & Plan Note (Signed)
She has intermittent leukopenia likely due to recent alcohol use She has oral thrush on exam and I recommend Diflucan.

## 2018-02-04 NOTE — Assessment & Plan Note (Signed)
She had recent alcohol use We discussed importance of staying abstinent.

## 2018-02-04 NOTE — Assessment & Plan Note (Signed)
She has acquired hypothyroidism Free thyroxine level is within normal limits.  She will continue thyroid replacement therapy

## 2018-02-04 NOTE — Progress Notes (Signed)
[facility] OFFICE PROGRESS NOTE  Patient Care Team: Trey Sailors, [place] as PCP - General (Physician Assistant) Chester Holstein, MD (Family Medicine) Heath Lark, MD as Consulting Physician (Hematology and Oncology) Ruby Cola, MD as Consulting Physician (Otolaryngology)  ASSESSMENT & PLAN:  History of laryngeal cancer She is cancer free from disease standpoint I have advised the patient to stop smoking and drinking as these are risk factors associated with cancer recurrence She will continue close follow-up with ENT service.  Smoking The patient has resumed smoking habit She is made aware about risk of cancer recurrence She is attempting to quit on her own  Alcohol abuse She had recent alcohol use We discussed importance of staying abstinent.  Iron deficiency anemia She had recent GI bleed secondary to gastric ulcer She has stopped drinking on a regular basis Anemia has improved Iron deficiency is improving.  Hypothyroidism (acquired) She has acquired hypothyroidism Free thyroxine level is within normal limits.  She will continue thyroid replacement therapy  Leukopenia She has intermittent leukopenia likely due to recent alcohol use She has oral thrush on exam and I recommend Diflucan.  Dysphagia She has recent dysphagia It could be related to yeast infection.  I recommend treatment with Diflucan She has ENT visit coming up She had recent EGD evaluation.   No orders of the defined types were placed in this encounter.   INTERVAL HISTORY: Please see below for problem oriented charting. She returns for further follow-up She had recent hospitalization for iron deficiency anemia secondary to GI bleed The patient denies any recent signs or symptoms of bleeding such as spontaneous epistaxis, hematuria or hematochezia. The patient continued to smoke several cigarettes per day She is attempting to quit drinking but drinks some alcohol several days  ago She complained of some dysphagia  SUMMARY OF ONCOLOGIC HISTORY:  This is a pleasant lady with diagnosis of laryngeal cancer from May of 2012. According to the patient, she presented with sensation of sore throat. She was treated with chemotherapy weekly along with radiation therapy. The patient had placement of tracheostomy. She had unexplained weight loss in August 2015. The CT scan showed possible pneumonia and she was treated with antibiotic therapy. Last imaging study on 08/26/2014 show no evidence of recurrence of cancer  REVIEW OF SYSTEMS:   Constitutional: Denies fevers, chills or abnormal weight loss Eyes: Denies blurriness of vision Ears, nose, mouth, throat, and face: Denies mucositis or sore throat Respiratory: Denies cough, dyspnea or wheezes Cardiovascular: Denies palpitation, chest discomfort or lower extremity swelling Gastrointestinal:  Denies nausea, heartburn or change in bowel habits Skin: Denies abnormal skin rashes Lymphatics: Denies new lymphadenopathy or easy bruising Neurological:Denies numbness, tingling or new weaknesses Behavioral/Psych: Mood is stable, no new changes  All other systems were reviewed with the patient and are negative.  I have reviewed the past medical history, past surgical history, social history and family history with the patient and they are unchanged from previous note.  ALLERGIES:  has No Known Allergies.  MEDICATIONS:  Current Outpatient Medications  Medication Sig Dispense Refill  . albuterol (PROVENTIL HFA;VENTOLIN HFA) 108 (90 Base) MCG/ACT inhaler Inhale 2 puffs into the lungs every 2 (two) hours as needed for wheezing or shortness of breath (cough). 1 Inhaler 0  . clonazePAM (KLONOPIN) 1 MG tablet Take 1 mg by mouth daily as needed for anxiety.     Marland Kitchen diltiazem (CARDIZEM CD) 240 MG 24 hr capsule Take 240 mg by mouth daily.  2  .  fluticasone (FLONASE) 50 MCG/ACT nasal spray Place 2 sprays into both nostrils daily. (Patient  not taking: Reported on 06/18/2033) 16 g 0  . folic acid (FOLVITE) 1 MG tablet Take 1 tablet (1 mg total) by mouth daily.    . hydrOXYzine (VISTARIL) 50 MG capsule Take 50 mg by mouth at bedtime.  1  . levothyroxine (SYNTHROID, LEVOTHROID) 25 MCG tablet TAKE 1 TABLET(25 MCG) BY MOUTH DAILY BEFORE BREAKFAST (Patient taking differently: Take 25 mcg by mouth daily before breakfast. ) 90 tablet 1  . losartan (COZAAR) 25 MG tablet Take 25 mg by mouth daily.  2  . Multiple Vitamin (MULTIVITAMIN WITH MINERALS) TABS tablet Take 1 tablet by mouth daily. (Patient not taking: Reported on 01/17/2018)    . nystatin (MYCOSTATIN) 100000 UNIT/ML suspension Take 5 mLs (500,000 Units total) by mouth 4 (four) times daily for 7 days. Swish and swallow 5 ml's four times daily for one week 140 mL 0  . pantoprazole (PROTONIX) 40 MG tablet Take 1 tablet (40 mg total) by mouth daily. 30 tablet 3  . polyethylene glycol (MIRALAX / GLYCOLAX) packet Take 17 g by mouth 2 (two) times daily. (Patient taking differently: Take 17 g by mouth daily as needed for mild constipation. ) 14 each 0  . potassium chloride SA (K-DUR,KLOR-CON) 20 MEQ tablet Take 10-20 mEq by mouth See admin instructions. Take 1 tablet (23mEq) by mouth in the morning; take 10 mEq by mouth in the evening    . senna-docusate (SENOKOT-S) 8.6-50 MG tablet Take 1 tablet by mouth at bedtime. (Patient taking differently: Take 1 tablet by mouth at bedtime as needed for mild constipation. )    . thiamine 100 MG tablet Take 1 tablet (100 mg total) by mouth daily.    Marland Kitchen topiramate (TOPAMAX) 50 MG tablet Take 100 mg by mouth 2 (two) times daily.     . traZODone (DESYREL) 100 MG tablet Take 100 mg by mouth at bedtime.  1   No current facility-administered medications for this visit.     PHYSICAL EXAMINATION: ECOG PERFORMANCE STATUS: 1 - Symptomatic but completely ambulatory  Vitals:   02/03/18 1230  BP: 119/83  Pulse: 82  Resp: 18  Temp: 98.3 F (36.8 C)  SpO2: 100%    Filed Weights   02/03/18 1230  Weight: 78 lb (35.4 kg)    GENERAL:alert, no distress and comfortable.  She is thin and cachectic SKIN: skin color, texture, turgor are normal, no rashes or significant lesions EYES: normal, Conjunctiva are pink and non-injected, sclera clear OROPHARYNX: Poor dentition.  Oral thrush is noted NECK: Neck is thick and fibrosis from prior radiation LYMPH:  no palpable lymphadenopathy in the cervical, axillary or inguinal LUNGS: clear to auscultation and percussion with normal breathing effort HEART: regular rate & rhythm and no murmurs and no lower extremity edema ABDOMEN:abdomen soft, non-tender and normal bowel sounds Musculoskeletal:no cyanosis of digits and no clubbing  NEURO: alert & oriented x 3 with fluent speech, no focal motor/sensory deficits  LABORATORY DATA:  I have reviewed the data as listed    Component Value Date/Time   NA 142 02/03/2018 1151   NA 138 12/14/2015 1412   K 3.6 02/03/2018 1151   K 3.8 12/14/2015 1412   CL 109 02/03/2018 1151   CO2 22 02/03/2018 1151   CO2 20 (L) 12/14/2015 1412   GLUCOSE 68 (L) 02/03/2018 1151   GLUCOSE 77 12/14/2015 1412   BUN 3 (L) 02/03/2018 1151   BUN 4.6 (L)  12/14/2015 1412   CREATININE 0.71 02/03/2018 1151   CREATININE 0.7 12/14/2015 1412   CALCIUM 9.1 02/03/2018 1151   CALCIUM 9.4 12/14/2015 1412   PROT 6.1 (L) 02/03/2018 1151   PROT 7.3 12/14/2015 1412   ALBUMIN 3.5 02/03/2018 1151   ALBUMIN 3.8 12/14/2015 1412   AST 45 (H) 02/03/2018 1151   AST 107 (H) 12/14/2015 1412   ALT 25 02/03/2018 1151   ALT 27 12/14/2015 1412   ALKPHOS 163 (H) 02/03/2018 1151   ALKPHOS 178 (H) 12/14/2015 1412   BILITOT 0.4 02/03/2018 1151   BILITOT <0.30 12/14/2015 1412   GFRNONAA >60 02/03/2018 1151   GFRAA >60 02/03/2018 1151    No results found for: SPEP, UPEP  Lab Results  Component Value Date   WBC 3.4 (L) 02/03/2018   NEUTROABS 2.5 02/03/2018   HGB 11.3 (L) 02/03/2018   HCT 34.3 (L)  02/03/2018   MCV 100.5 02/03/2018   PLT 230 02/03/2018      Chemistry      Component Value Date/Time   NA 142 02/03/2018 1151   NA 138 12/14/2015 1412   K 3.6 02/03/2018 1151   K 3.8 12/14/2015 1412   CL 109 02/03/2018 1151   CO2 22 02/03/2018 1151   CO2 20 (L) 12/14/2015 1412   BUN 3 (L) 02/03/2018 1151   BUN 4.6 (L) 12/14/2015 1412   CREATININE 0.71 02/03/2018 1151   CREATININE 0.7 12/14/2015 1412      Component Value Date/Time   CALCIUM 9.1 02/03/2018 1151   CALCIUM 9.4 12/14/2015 1412   ALKPHOS 163 (H) 02/03/2018 1151   ALKPHOS 178 (H) 12/14/2015 1412   AST 45 (H) 02/03/2018 1151   AST 107 (H) 12/14/2015 1412   ALT 25 02/03/2018 1151   ALT 27 12/14/2015 1412   BILITOT 0.4 02/03/2018 1151   BILITOT <0.30 12/14/2015 1412       RADIOGRAPHIC STUDIES: I have personally reviewed the radiological images as listed and agreed with the findings in the report. Dg Chest 1 View  Result Date: 01/17/2018 CLINICAL DATA:  Possible GI bleeding. EXAM: CHEST  1 VIEW COMPARISON:  06/02/2017 FINDINGS: Cardiomediastinal silhouette is normal. Mediastinal contours appear intact. There is no evidence of focal airspace consolidation, pleural effusion or pneumothorax. Scatter lung granulomata noted. Scarring in the right lower lobe. Osseous structures are without acute abnormality. Dextroconvex scoliosis of the thoracic spine. IMPRESSION: No active disease. Electronically Signed   By: Fidela Salisbury M.D.   On: 01/17/2018 13:42   Ct Abdomen Pelvis W Contrast  Result Date: 01/17/2018 CLINICAL DATA:  Diffuse abdominal pain for the past 2 days. Hematemesis. EXAM: CT ABDOMEN AND PELVIS WITH CONTRAST TECHNIQUE: Multidetector CT imaging of the abdomen and pelvis was performed using the standard protocol following bolus administration of intravenous contrast. CONTRAST:  148mL ISOVUE-300 IOPAMIDOL (ISOVUE-300) INJECTION 61% COMPARISON:  01/20/2017. FINDINGS: Lower chest: Small amount of linear  atelectasis or scarring at both lung bases. Multiple bilateral calcified granulomata. Hepatobiliary: Diffuse low density of the liver relative to the spleen. 3 mm gallstone in the gallbladder. No gallbladder wall thickening or pericholecystic fluid. Pancreas: Unremarkable. No pancreatic ductal dilatation or surrounding inflammatory changes. Spleen: Multiple small calcified granulomata. Adrenals/Urinary Tract: Normal appearing adrenal glands. Small right renal cyst. Normal appearing left kidney, ureters and urinary bladder. Stomach/Bowel: Prominent stool in the rectum. Normal appearing stomach, small bowel and appendix. Vascular/Lymphatic: Mild arterial atheromatous changes. No enlarged lymph nodes. Reproductive: Uterus and bilateral adnexa are unremarkable. Other: Stable tubal ligation clips  in the right pelvis, not associated with the adnexae. Musculoskeletal: Unremarkable bones. IMPRESSION: 1. No acute abnormality. 2. Prominent stool in the rectum. 3. Diffuse hepatic steatosis. 4. Cholelithiasis. 5. Two tubal ligation clips in the right pelvis, not associated with the adnexae. Electronically Signed   By: Claudie Revering M.D.   On: 01/17/2018 15:57    All questions were answered. The patient knows to call the clinic with any problems, questions or concerns. No barriers to learning was detected.  I spent 15 minutes counseling the patient face to face. The total time spent in the appointment was 20 minutes and more than 50% was on counseling and review of test results  Heath Lark, MD 02/04/2018 8:05 AM

## 2018-02-04 NOTE — Assessment & Plan Note (Signed)
She had recent GI bleed secondary to gastric ulcer She has stopped drinking on a regular basis Anemia has improved Iron deficiency is improving.

## 2018-02-04 NOTE — Assessment & Plan Note (Signed)
She has recent dysphagia It could be related to yeast infection.  I recommend treatment with Diflucan She has ENT visit coming up She had recent EGD evaluation.

## 2018-02-04 NOTE — Telephone Encounter (Signed)
Called and given below message. She verbalized understanding. 

## 2018-03-05 ENCOUNTER — Other Ambulatory Visit: Payer: Self-pay | Admitting: Otolaryngology

## 2018-03-05 DIAGNOSIS — J9504 Tracheo-esophageal fistula following tracheostomy: Secondary | ICD-10-CM

## 2018-03-05 DIAGNOSIS — J029 Acute pharyngitis, unspecified: Secondary | ICD-10-CM

## 2018-03-05 DIAGNOSIS — R634 Abnormal weight loss: Secondary | ICD-10-CM

## 2018-03-05 DIAGNOSIS — Z8521 Personal history of malignant neoplasm of larynx: Secondary | ICD-10-CM

## 2018-03-05 DIAGNOSIS — J3801 Paralysis of vocal cords and larynx, unilateral: Secondary | ICD-10-CM

## 2018-03-05 DIAGNOSIS — Z923 Personal history of irradiation: Secondary | ICD-10-CM

## 2018-03-12 ENCOUNTER — Other Ambulatory Visit: Payer: Medicaid Other

## 2018-05-03 ENCOUNTER — Observation Stay (HOSPITAL_COMMUNITY)
Admission: EM | Admit: 2018-05-03 | Discharge: 2018-05-04 | Disposition: A | Discharge status: Home / Self Care | Payer: Medicaid Other | Attending: Internal Medicine | Admitting: Internal Medicine

## 2018-05-03 ENCOUNTER — Emergency Department (HOSPITAL_COMMUNITY): Payer: Medicaid Other

## 2018-05-03 ENCOUNTER — Other Ambulatory Visit: Payer: Self-pay

## 2018-05-03 ENCOUNTER — Encounter (HOSPITAL_COMMUNITY): Payer: Self-pay | Admitting: Emergency Medicine

## 2018-05-03 DIAGNOSIS — E039 Hypothyroidism, unspecified: Secondary | ICD-10-CM | POA: Diagnosis not present

## 2018-05-03 DIAGNOSIS — E876 Hypokalemia: Secondary | ICD-10-CM | POA: Diagnosis not present

## 2018-05-03 DIAGNOSIS — Z87891 Personal history of nicotine dependence: Secondary | ICD-10-CM | POA: Insufficient documentation

## 2018-05-03 DIAGNOSIS — R569 Unspecified convulsions: Secondary | ICD-10-CM | POA: Diagnosis present

## 2018-05-03 DIAGNOSIS — I10 Essential (primary) hypertension: Secondary | ICD-10-CM | POA: Diagnosis present

## 2018-05-03 DIAGNOSIS — R51 Headache: Secondary | ICD-10-CM

## 2018-05-03 DIAGNOSIS — F1010 Alcohol abuse, uncomplicated: Secondary | ICD-10-CM | POA: Diagnosis not present

## 2018-05-03 DIAGNOSIS — I160 Hypertensive urgency: Secondary | ICD-10-CM | POA: Diagnosis not present

## 2018-05-03 DIAGNOSIS — R519 Headache, unspecified: Secondary | ICD-10-CM | POA: Diagnosis present

## 2018-05-03 DIAGNOSIS — Z79899 Other long term (current) drug therapy: Secondary | ICD-10-CM | POA: Diagnosis not present

## 2018-05-03 LAB — COMPREHENSIVE METABOLIC PANEL
ALT: 20 U/L (ref 0–44)
AST: 34 U/L (ref 15–41)
Albumin: 3.1 g/dL — ABNORMAL LOW (ref 3.5–5.0)
Alkaline Phosphatase: 129 U/L — ABNORMAL HIGH (ref 38–126)
Anion gap: 15 (ref 5–15)
BUN: 7 mg/dL (ref 6–20)
CO2: 23 mmol/L (ref 22–32)
Calcium: 9.5 mg/dL (ref 8.9–10.3)
Chloride: 99 mmol/L (ref 98–111)
Creatinine, Ser: 0.97 mg/dL (ref 0.44–1.00)
GFR calc Af Amer: >60 mL/min (ref >60)
GFR calc non Af Amer: >60 mL/min (ref >60)
Glucose, Bld: 98 mg/dL (ref 70–99)
Potassium: 2.4 mmol/L — CL (ref 3.5–5.1)
Sodium: 137 mmol/L (ref 135–145)
Total Bilirubin: 0.8 mg/dL (ref 0.3–1.2)
Total Protein: 6.1 g/dL — ABNORMAL LOW (ref 6.5–8.1)

## 2018-05-03 LAB — URINALYSIS, ROUTINE W REFLEX MICROSCOPIC
BILIRUBIN URINE: NEGATIVE
Glucose, UA: 100 mg/dL — AB
Ketones, ur: NEGATIVE mg/dL
Nitrite: NEGATIVE
Protein, ur: NEGATIVE mg/dL
SPECIFIC GRAVITY, URINE: 1.01 (ref 1.005–1.030)
pH: 7 (ref 5.0–8.0)

## 2018-05-03 LAB — BASIC METABOLIC PANEL
ANION GAP: 9 (ref 5–15)
Anion gap: 12 (ref 5–15)
BUN: 6 mg/dL (ref 6–20)
BUN: <5 mg/dL — ABNORMAL LOW (ref 6–20)
CO2: 24 mmol/L (ref 22–32)
CO2: 22 mmol/L (ref 22–32)
Calcium: 8.8 mg/dL — ABNORMAL LOW (ref 8.9–10.3)
Calcium: 8.3 mg/dL — ABNORMAL LOW (ref 8.9–10.3)
Chloride: 100 mmol/L (ref 98–111)
Chloride: 105 mmol/L (ref 98–111)
Creatinine, Ser: 0.87 mg/dL (ref 0.44–1.00)
Creatinine, Ser: 0.79 mg/dL (ref 0.44–1.00)
GFR calc Af Amer: >60 mL/min (ref >60)
GFR calc Af Amer: >60 mL/min (ref >60)
GFR calc non Af Amer: >60 mL/min (ref >60)
GFR calc non Af Amer: >60 mL/min (ref >60)
Glucose, Bld: 83 mg/dL (ref 70–99)
Glucose, Bld: 77 mg/dL (ref 70–99)
POTASSIUM: 2.9 mmol/L — AB (ref 3.5–5.1)
Potassium: 2.6 mmol/L — CL (ref 3.5–5.1)
Sodium: 136 mmol/L (ref 135–145)
Sodium: 136 mmol/L (ref 135–145)

## 2018-05-03 LAB — CBG MONITORING, ED: Glucose-Capillary: 105 mg/dL — ABNORMAL HIGH (ref 70–99)

## 2018-05-03 LAB — I-STAT CHEM 8, ED
BUN: 8 mg/dL (ref 6–20)
CREATININE: 0.8 mg/dL (ref 0.44–1.00)
Calcium, Ion: 1.19 mmol/L (ref 1.15–1.40)
Chloride: 99 mmol/L (ref 98–111)
GLUCOSE: 97 mg/dL (ref 70–99)
HCT: 48 % — ABNORMAL HIGH (ref 36.0–46.0)
HEMOGLOBIN: 16.3 g/dL — AB (ref 12.0–15.0)
Potassium: 2.5 mmol/L — CL (ref 3.5–5.1)
Sodium: 135 mmol/L (ref 135–145)
TCO2: 27 mmol/L (ref 22–32)

## 2018-05-03 LAB — I-STAT VENOUS BLOOD GAS, ED
BICARBONATE: 26.3 mmol/L (ref 20.0–28.0)
O2 Saturation: 60 %
TCO2: 28 mmol/L (ref 22–32)
pCO2, Ven: 45.9 mmHg (ref 44.0–60.0)
pH, Ven: 7.367 (ref 7.250–7.430)
pO2, Ven: 32 mmHg (ref 32.0–45.0)

## 2018-05-03 LAB — URINALYSIS, MICROSCOPIC (REFLEX): RBC / HPF: NONE SEEN RBC/hpf (ref 0–5)

## 2018-05-03 LAB — CBC WITH DIFFERENTIAL/PLATELET
Abs Immature Granulocytes: 0.02 10*3/uL (ref 0.00–0.07)
Basophils Absolute: 0.1 10*3/uL (ref 0.0–0.1)
Basophils Relative: 2 %
Eosinophils Absolute: 0.2 10*3/uL (ref 0.0–0.5)
Eosinophils Relative: 5 %
HCT: 44.5 % (ref 36.0–46.0)
Hemoglobin: 15.2 g/dL — ABNORMAL HIGH (ref 12.0–15.0)
Immature Granulocytes: 1 %
Lymphocytes Relative: 8 %
Lymphs Abs: 0.4 10*3/uL — ABNORMAL LOW (ref 0.7–4.0)
MCH: 31.7 pg (ref 26.0–34.0)
MCHC: 34.2 g/dL (ref 30.0–36.0)
MCV: 92.7 fL (ref 80.0–100.0)
Monocytes Absolute: 0.4 10*3/uL (ref 0.1–1.0)
Monocytes Relative: 9 %
Neutro Abs: 3.3 10*3/uL (ref 1.7–7.7)
Neutrophils Relative %: 75 %
Platelets: ADEQUATE 10*3/uL (ref 150–400)
RBC: 4.8 MIL/uL (ref 3.87–5.11)
RDW: 12.9 % (ref 11.5–15.5)
WBC: 4.4 10*3/uL (ref 4.0–10.5)
nRBC: 0 % (ref 0.0–0.2)

## 2018-05-03 LAB — ETHANOL: Alcohol, Ethyl (B): <10 mg/dL (ref <10)

## 2018-05-03 LAB — RAPID URINE DRUG SCREEN, HOSP PERFORMED
Amphetamines: NOT DETECTED
BARBITURATES: NOT DETECTED
Benzodiazepines: POSITIVE — AB
Cocaine: NOT DETECTED
Opiates: NOT DETECTED
Tetrahydrocannabinol: POSITIVE — AB

## 2018-05-03 LAB — TSH: TSH: 116.578 u[IU]/mL — ABNORMAL HIGH (ref 0.350–4.500)

## 2018-05-03 LAB — AMMONIA: Ammonia: 36 umol/L — ABNORMAL HIGH (ref 9–35)

## 2018-05-03 LAB — I-STAT CG4 LACTIC ACID, ED: LACTIC ACID, VENOUS: 2.27 mmol/L — AB (ref 0.5–1.9)

## 2018-05-03 LAB — I-STAT BETA HCG BLOOD, ED (MC, WL, AP ONLY): I-stat hCG, quantitative: 5.9 m[IU]/mL — ABNORMAL HIGH (ref <5)

## 2018-05-03 LAB — GLUCOSE, CAPILLARY
Glucose-Capillary: 70 mg/dL (ref 70–99)
Glucose-Capillary: 222 mg/dL — ABNORMAL HIGH (ref 70–99)

## 2018-05-03 LAB — MAGNESIUM: Magnesium: 1.7 mg/dL (ref 1.7–2.4)

## 2018-05-03 LAB — LACTIC ACID, PLASMA: Lactic Acid, Venous: 2.4 mmol/L (ref 0.5–1.9)

## 2018-05-03 MED ORDER — PANTOPRAZOLE SODIUM 40 MG PO TBEC
40.0000 mg | DELAYED_RELEASE_TABLET | Freq: Every day | ORAL | Status: DC
  Administered 2018-05-03 – 2018-05-04 (×2): 40 mg via ORAL
  Filled 2018-05-03 (×2): qty 1

## 2018-05-03 MED ORDER — DEXTROSE 50 % IV SOLN
INTRAVENOUS | Status: AC
  Filled 2018-05-03: qty 50

## 2018-05-03 MED ORDER — MAGNESIUM SULFATE IN D5W 1-5 GM/100ML-% IV SOLN
1.0000 g | Freq: Once | INTRAVENOUS | Status: AC
  Administered 2018-05-03: 1 g via INTRAVENOUS
  Filled 2018-05-03: qty 100

## 2018-05-03 MED ORDER — DILTIAZEM HCL ER COATED BEADS 240 MG PO CP24
240.0000 mg | ORAL_CAPSULE | Freq: Every day | ORAL | Status: DC
  Administered 2018-05-03: 240 mg via ORAL
  Filled 2018-05-03 (×2): qty 1

## 2018-05-03 MED ORDER — POTASSIUM CHLORIDE CRYS ER 20 MEQ PO TBCR: 40.0000 meq | EXTENDED_RELEASE_TABLET | ORAL | Status: DC

## 2018-05-03 MED ORDER — POTASSIUM CHLORIDE CRYS ER 20 MEQ PO TBCR
40.0000 meq | EXTENDED_RELEASE_TABLET | ORAL | Status: AC
  Filled 2018-05-03: qty 2

## 2018-05-03 MED ORDER — VITAMIN B-1 100 MG PO TABS
100.0000 mg | ORAL_TABLET | Freq: Every day | ORAL | Status: DC
  Administered 2018-05-03 – 2018-05-04 (×2): 100 mg via ORAL
  Filled 2018-05-03 (×2): qty 1

## 2018-05-03 MED ORDER — POTASSIUM CHLORIDE CRYS ER 20 MEQ PO TBCR
20.0000 meq | EXTENDED_RELEASE_TABLET | Freq: Two times a day (BID) | ORAL | Status: DC
  Administered 2018-05-03 – 2018-05-04 (×2): 20 meq via ORAL
  Filled 2018-05-03 (×3): qty 1

## 2018-05-03 MED ORDER — DIPHENHYDRAMINE HCL 50 MG/ML IJ SOLN
25.0000 mg | Freq: Once | INTRAMUSCULAR | Status: AC
  Administered 2018-05-03: 25 mg via INTRAVENOUS
  Filled 2018-05-03: qty 1

## 2018-05-03 MED ORDER — TOPIRAMATE 25 MG PO TABS: 100.0000 mg | ORAL_TABLET | Freq: Two times a day (BID) | ORAL | Status: DC

## 2018-05-03 MED ORDER — SODIUM CHLORIDE 0.9 % IV BOLUS
1000.0000 mL | Freq: Once | INTRAVENOUS | Status: AC
  Administered 2018-05-03: 1000 mL via INTRAVENOUS

## 2018-05-03 MED ORDER — POTASSIUM CHLORIDE IN NACL 40-0.9 MEQ/L-% IV SOLN
INTRAVENOUS | Status: DC
  Administered 2018-05-03: 100 mL/h via INTRAVENOUS
  Filled 2018-05-03: qty 1000

## 2018-05-03 MED ORDER — MAGNESIUM OXIDE 400 (241.3 MG) MG PO TABS
800.0000 mg | ORAL_TABLET | Freq: Once | ORAL | Status: DC
  Filled 2018-05-03: qty 2

## 2018-05-03 MED ORDER — ONDANSETRON HCL 4 MG/2ML IJ SOLN: 4.0000 mg | Freq: Four times a day (QID) | INTRAMUSCULAR | Status: DC | PRN

## 2018-05-03 MED ORDER — POLYETHYLENE GLYCOL 3350 17 G PO PACK: 17.0000 g | PACK | Freq: Every day | ORAL | Status: DC | PRN

## 2018-05-03 MED ORDER — ACETAMINOPHEN 650 MG RE SUPP: 650.0000 mg | Freq: Four times a day (QID) | RECTAL | Status: DC | PRN

## 2018-05-03 MED ORDER — TRAZODONE HCL 100 MG PO TABS: 100.0000 mg | ORAL_TABLET | Freq: Every day | ORAL | Status: DC

## 2018-05-03 MED ORDER — LORAZEPAM 1 MG PO TABS: 0.0000 mg | ORAL_TABLET | Freq: Four times a day (QID) | ORAL | Status: DC

## 2018-05-03 MED ORDER — LORAZEPAM 1 MG PO TABS: 0.0000 mg | ORAL_TABLET | Freq: Two times a day (BID) | ORAL | Status: DC

## 2018-05-03 MED ORDER — POTASSIUM CHLORIDE 10 MEQ/100ML IV SOLN
10.0000 meq | INTRAVENOUS | Status: AC
  Administered 2018-05-03 (×2): 10 meq via INTRAVENOUS
  Filled 2018-05-03 (×2): qty 100

## 2018-05-03 MED ORDER — PROCHLORPERAZINE EDISYLATE 10 MG/2ML IJ SOLN
10.0000 mg | Freq: Once | INTRAMUSCULAR | Status: AC
  Administered 2018-05-03: 10 mg via INTRAVENOUS
  Filled 2018-05-03: qty 2

## 2018-05-03 MED ORDER — POTASSIUM CHLORIDE CRYS ER 20 MEQ PO TBCR
40.0000 meq | EXTENDED_RELEASE_TABLET | ORAL | Status: AC
  Administered 2018-05-03 (×2): 40 meq via ORAL
  Filled 2018-05-03: qty 2

## 2018-05-03 MED ORDER — CHLORDIAZEPOXIDE HCL 25 MG PO CAPS
100.0000 mg | ORAL_CAPSULE | Freq: Once | ORAL | Status: AC
  Administered 2018-05-03: 100 mg via ORAL
  Filled 2018-05-03: qty 4

## 2018-05-03 MED ORDER — POTASSIUM CHLORIDE CRYS ER 20 MEQ PO TBCR: 20.0000 meq | EXTENDED_RELEASE_TABLET | Freq: Two times a day (BID) | ORAL | Status: DC

## 2018-05-03 MED ORDER — DEXTROSE 50 % IV SOLN
12.5000 g | Freq: Once | INTRAVENOUS | Status: AC
  Administered 2018-05-03: 12.5 g via INTRAVENOUS

## 2018-05-03 MED ORDER — LEVETIRACETAM IN NACL 500 MG/100ML IV SOLN
500.0000 mg | Freq: Two times a day (BID) | INTRAVENOUS | Status: DC
  Administered 2018-05-03 – 2018-05-04 (×3): 500 mg via INTRAVENOUS
  Filled 2018-05-03 (×3): qty 100

## 2018-05-03 MED ORDER — LORAZEPAM 2 MG/ML IJ SOLN: 1.0000 mg | Freq: Four times a day (QID) | INTRAMUSCULAR | Status: DC | PRN

## 2018-05-03 MED ORDER — LEVOTHYROXINE SODIUM 100 MCG/5ML IV SOLN
12.5000 ug | Freq: Every day | INTRAVENOUS | Status: DC
  Administered 2018-05-03: 12.5 ug via INTRAVENOUS
  Filled 2018-05-03: qty 5

## 2018-05-03 MED ORDER — SODIUM CHLORIDE 0.9 % IV SOLN: INTRAVENOUS | Status: DC

## 2018-05-03 MED ORDER — SODIUM CHLORIDE 0.9% FLUSH
3.0000 mL | Freq: Two times a day (BID) | INTRAVENOUS | Status: DC
  Administered 2018-05-03 – 2018-05-04 (×2): 3 mL via INTRAVENOUS

## 2018-05-03 MED ORDER — FOLIC ACID 1 MG PO TABS
1.0000 mg | ORAL_TABLET | Freq: Every day | ORAL | Status: DC
  Administered 2018-05-03 – 2018-05-04 (×2): 1 mg via ORAL
  Filled 2018-05-03 (×2): qty 1

## 2018-05-03 MED ORDER — ALBUTEROL SULFATE (2.5 MG/3ML) 0.083% IN NEBU: 2.5000 mg | INHALATION_SOLUTION | RESPIRATORY_TRACT | Status: DC | PRN

## 2018-05-03 MED ORDER — ONDANSETRON HCL 4 MG PO TABS: 4.0000 mg | ORAL_TABLET | Freq: Four times a day (QID) | ORAL | Status: DC | PRN

## 2018-05-03 MED ORDER — LEVETIRACETAM IN NACL 1000 MG/100ML IV SOLN
1000.0000 mg | Freq: Once | INTRAVENOUS | Status: AC
  Administered 2018-05-03: 1000 mg via INTRAVENOUS
  Filled 2018-05-03 (×2): qty 100

## 2018-05-03 MED ORDER — LOSARTAN POTASSIUM 25 MG PO TABS
25.0000 mg | ORAL_TABLET | Freq: Every day | ORAL | Status: DC
  Administered 2018-05-03: 25 mg via ORAL
  Filled 2018-05-03 (×2): qty 1

## 2018-05-03 MED ORDER — LEVOTHYROXINE SODIUM 25 MCG PO TABS
25.0000 ug | ORAL_TABLET | Freq: Every day | ORAL | Status: DC
  Administered 2018-05-04: 25 ug via ORAL
  Filled 2018-05-03: qty 1

## 2018-05-03 MED ORDER — ACETAMINOPHEN 325 MG PO TABS
650.0000 mg | ORAL_TABLET | Freq: Four times a day (QID) | ORAL | Status: DC | PRN
  Administered 2018-05-03 – 2018-05-04 (×2): 650 mg via ORAL
  Filled 2018-05-03: qty 2

## 2018-05-03 MED ORDER — ADULT MULTIVITAMIN W/MINERALS CH
1.0000 | ORAL_TABLET | Freq: Every day | ORAL | Status: DC
  Administered 2018-05-03 – 2018-05-04 (×2): 1 via ORAL
  Filled 2018-05-03 (×2): qty 1

## 2018-05-03 MED ORDER — MAGNESIUM SULFATE IN D5W 1-5 GM/100ML-% IV SOLN
1.0000 g | Freq: Once | INTRAVENOUS | Status: DC
  Filled 2018-05-03 (×2): qty 100

## 2018-05-03 MED ORDER — CLONAZEPAM 1 MG PO TABS: 1.0000 mg | ORAL_TABLET | Freq: Every day | ORAL | Status: DC | PRN

## 2018-05-03 MED ORDER — LEVOTHYROXINE SODIUM 25 MCG PO TABS: 25.0000 ug | ORAL_TABLET | Freq: Every day | ORAL | Status: DC

## 2018-05-03 MED ORDER — LABETALOL HCL 5 MG/ML IV SOLN: 10.0000 mg | INTRAVENOUS | Status: DC | PRN

## 2018-05-03 MED ORDER — LEVETIRACETAM 500 MG PO TABS: 500.0000 mg | ORAL_TABLET | Freq: Two times a day (BID) | ORAL | Status: DC

## 2018-05-03 MED ORDER — LORAZEPAM 1 MG PO TABS: 1.0000 mg | ORAL_TABLET | Freq: Four times a day (QID) | ORAL | Status: DC | PRN

## 2018-05-03 MED ORDER — HYDROXYZINE PAMOATE 50 MG PO CAPS
50.0000 mg | ORAL_CAPSULE | Freq: Every day | ORAL | Status: DC
  Administered 2018-05-03: 50 mg via ORAL
  Filled 2018-05-03: qty 1

## 2018-05-03 NOTE — ED Notes (Signed)
Unsuccessful lab draw x1. 

## 2018-05-03 NOTE — ED Provider Notes (Signed)
[facility] EMERGENCY DEPARTMENT Provider Note   CSN: [id number] Arrival date & time: 05/03/18  0015     History   Chief Complaint Chief Complaint  Patient presents with  . Fall  . Seizures    HPI Crystal Spence is a 42 y.o. female.  42 yo F with a chief complaint of possible seizure-like activity.  She was at home with some small children and they noted that she lost consciousness was foaming at the mouth and having some shaking.  When EMS arrived she was somewhat confused.  Patient does not remember the events.  She had an episode of this a few months ago and was admitted to the hospital and thought to be due to alcohol withdrawal.  She has been trying to quit drinking since then.  States she has 40 ounce beer occasionally.  She has felt bad today but has difficulty quantifying that further.  She states she has had a very mild cough and she is been having a migraine which is somewhat worse than her normal.  Denies difficulty with speech or swallowing.  Denied nausea vomiting or diarrhea.  Denies sick contacts.  She denies neck pain.  The history is provided by the patient.  Fall  Associated symptoms include headaches. Pertinent negatives include no chest pain, no abdominal pain and no shortness of breath.  Seizures   Associated symptoms include headaches. Pertinent negatives include no visual disturbance, no chest pain, no nausea and no vomiting.  Illness  This is a new problem. The current episode started 12 to 24 hours ago. The problem occurs constantly. The problem has not changed since onset.Associated symptoms include headaches. Pertinent negatives include no chest pain, no abdominal pain and no shortness of breath. Nothing aggravates the symptoms. Nothing relieves the symptoms. She has tried nothing for the symptoms. The treatment provided no relief.    Past Medical History:  Diagnosis Date  . Anemia, unspecified 06/17/2013  . Headache   . History of  laryngeal cancer 02/03/2013  . Hypertension   . Hypothyroidism (acquired) 06/17/2013  . Multiple lung nodules 06/28/2014  . Pneumonia 01/14/2014, 04/2017  . Poor oral hygiene 06/17/2013  . Rib pain 01/14/2014  . Rib pain on left side 01/05/2014  . Throat cancer [facility])     Patient Active Problem List   Diagnosis Date Noted  . Seizure-like activity ([place]) 05/03/2018  . Dysphagia 02/04/2018  . Pressure injury of skin 01/18/2018  . GI bleed 01/17/2018  . Melena   . Smoking 11/03/2017  . Seizure ([place])   . Essential hypertension 06/02/2017  . Positive pregnancy test 06/02/2017  . Bronchitis, acute   . Observed seizure-like activity ([place]) 06/01/2017  . Medication monitoring encounter 04/29/2016  . Protein-calorie malnutrition, severe 03/19/2016  . Chronic anemia   . Thrombocytosis ([place])   . Abscess of lower lobe of right lung with pneumonia ([place])   . Hypoxemia   . Poor dentition   . Pulmonary abscess ([place]) 03/17/2016  . Dehydration 03/17/2016  . Abnormal LFTs 12/14/2015  . Iron deficiency anemia 12/04/2015  . Multiple lung nodules 06/28/2014  . Other type of nonintractable migraine 06/28/2014  . Leukopenia 06/28/2014  . Community acquired pneumonia of right lower lobe of lung ([place]) 01/14/2014  . Rib pain 01/14/2014  . Rib pain on left side 01/05/2014  . Unexplained weight loss 01/05/2014  . Anemia in chronic illness 06/17/2013  . Hypothyroidism (acquired) 06/17/2013  . Poor oral hygiene 06/17/2013  . Acute blood loss  anemia 02/03/2013  . Syncope 02/03/2013  . Hyponatremia 02/03/2013  . History of laryngeal cancer 02/03/2013  . Hypokalemia 02/02/2013  . Alcohol abuse 02/02/2013  . Upper GI bleed 02/02/2013    Past Surgical History:  Procedure Laterality Date  . ABDOMINAL SURGERY    . BIOPSY  01/18/2018   Procedure: BIOPSY;  Surgeon: Wonda Horner, MD;  Location: [facility];  Service: Endoscopy;;  . COLONOSCOPY WITH PROPOFOL N/A 10/12/2015   Procedure: COLONOSCOPY WITH PROPOFOL;   Surgeon: Wonda Horner, MD;  Location: [facility];  Service: Endoscopy;  Laterality: N/A;  . ESOPHAGOGASTRODUODENOSCOPY N/A 02/03/2013   Procedure: ESOPHAGOGASTRODUODENOSCOPY (EGD);  Surgeon: Juanita Craver, MD;  Location: [facility];  Service: Endoscopy;  Laterality: N/A;  . ESOPHAGOGASTRODUODENOSCOPY N/A 10/12/2015   Procedure: ESOPHAGOGASTRODUODENOSCOPY (EGD);  Surgeon: Wonda Horner, MD;  Location: [facility];  Service: Endoscopy;  Laterality: N/A;  . ESOPHAGOGASTRODUODENOSCOPY (EGD) WITH PROPOFOL N/A 01/18/2018   Procedure: ESOPHAGOGASTRODUODENOSCOPY (EGD) WITH PROPOFOL;  Surgeon: Wonda Horner, MD;  Location: [facility]COPY;  Service: Endoscopy;  Laterality: N/A;  . 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 TRACHEOSTOMY     for throat inflammation from radiation  . TUBAL LIGATION       OB History   No obstetric history on file.      Home Medications    Prior to Admission medications   Medication Sig Start Date End Date Taking? Authorizing Provider  albuterol (PROVENTIL HFA;VENTOLIN HFA) 108 (90 Base) MCG/ACT inhaler Inhale 2 puffs into the lungs every 2 (two) hours as needed for wheezing or shortness of breath (cough). 02/20/17   Ripley Fraise, MD  clonazePAM (KLONOPIN) 1 MG tablet Take 1 mg by mouth daily as needed for anxiety.  12/15/15   [provider]  diltiazem (CARDIZEM CD) 240 MG 24 hr capsule Take 240 mg by mouth daily. 11/21/17   [provider]  fluticasone (FLONASE) 50 MCG/ACT nasal spray Place 2 sprays into both nostrils daily. Patient not taking: Reported on 01/17/2018 06/06/17   Eugenie Filler, MD  folic acid (FOLVITE) 1 MG tablet Take 1 tablet (1 mg total) by mouth daily. 06/06/17   Eugenie Filler, MD  hydrOXYzine (VISTARIL) 50 MG capsule Take 50 mg by mouth at bedtime. 11/21/17   [provider]    levothyroxine (SYNTHROID, LEVOTHROID) 25 MCG tablet TAKE 1 TABLET(25 MCG) BY MOUTH DAILY BEFORE BREAKFAST Patient taking differently: Take 25 mcg by mouth daily before breakfast.  11/03/17   Heath Lark, MD  losartan (COZAAR) 25 MG tablet Take 25 mg by mouth daily. 11/21/17   [provider]  Multiple Vitamin (MULTIVITAMIN WITH MINERALS) TABS tablet Take 1 tablet by mouth daily. Patient not taking: Reported on 01/17/2018 06/06/17   Eugenie Filler, MD  pantoprazole (PROTONIX) 40 MG tablet Take 1 tablet (40 mg total) by mouth daily. 01/19/18   Jean Rosenthal, MD  polyethylene glycol (MIRALAX / GLYCOLAX) packet Take 17 g by mouth 2 (two) times daily. Patient taking differently: Take 17 g by mouth daily as needed for mild constipation.  06/05/17   Eugenie Filler, MD  potassium chloride SA (K-DUR,KLOR-CON) 20 MEQ tablet Take 10-20 mEq by mouth See admin instructions. Take 1 tablet (13mEq) by mouth in the morning; take 10 mEq by mouth in the evening 08/29/14   [provider]  senna-docusate (SENOKOT-S) 8.6-50 MG tablet  Take 1 tablet by mouth at bedtime. Patient taking differently: Take 1 tablet by mouth at bedtime as needed for mild constipation.  06/05/17   Eugenie Filler, MD  thiamine 100 MG tablet Take 1 tablet (100 mg total) by mouth daily. 06/06/17   Eugenie Filler, MD  topiramate (TOPAMAX) 50 MG tablet Take 100 mg by mouth 2 (two) times daily.     [provider]  traZODone (DESYREL) 100 MG tablet Take 100 mg by mouth at bedtime. 08/15/15   [provider]    Family History Family History  Problem Relation Age of Onset  . Cancer Maternal Grandmother        skin cancer    Social History Social History   Tobacco Use  . Smoking status: Former Smoker    Packs/day: 1.00    Years: 15.00    Pack years: 15.00    Last attempt to quit: 09/11/2010    Years since quitting: 7.6  . Smokeless tobacco: Never Used  Substance Use Topics  . Alcohol use: Yes     Alcohol/week: 2.0 standard drinks    Types: 2 Cans of beer per week    Comment: rare  . Drug use: No    Comment: occasional     Allergies   Patient has no known allergies.   Review of Systems Review of Systems  Constitutional: Negative for chills and fever.  HENT: Negative for congestion and rhinorrhea.   Eyes: Negative for redness and visual disturbance.  Respiratory: Negative for shortness of breath and wheezing.   Cardiovascular: Negative for chest pain and palpitations.  Gastrointestinal: Negative for abdominal pain, nausea and vomiting.  Genitourinary: Negative for dysuria and urgency.  Musculoskeletal: Negative for arthralgias and myalgias.  Skin: Negative for pallor and wound.  Neurological: Positive for seizures and headaches. Negative for dizziness.     Physical Exam Updated Vital Signs BP (!) 177/106   Pulse 78   Temp (!) 94.7 F (34.8 C) (Oral) Comment: RN notified  Resp 20   LMP 10/20/2012 Comment: pt. has had tubal ligation,no longer has peroids  SpO2 100%   Physical Exam Vitals signs and nursing note reviewed.  Constitutional:      General: She is not in acute distress.    Appearance: She is well-developed. She is not diaphoretic.  HENT:     Head: Normocephalic and atraumatic.  Eyes:     Pupils: Pupils are equal, round, and reactive to light.  Neck:     Musculoskeletal: Normal range of motion and neck supple.  Cardiovascular:     Rate and Rhythm: Normal rate and regular rhythm.     Heart sounds: No murmur. No friction rub. No gallop.   Pulmonary:     Effort: Pulmonary effort is normal.     Breath sounds: No wheezing or rales.  Abdominal:     General: There is no distension.     Palpations: Abdomen is soft.     Tenderness: There is no abdominal tenderness.  Musculoskeletal:        General: No tenderness.  Skin:    General: Skin is warm and dry.  Neurological:     Mental Status: She is alert and oriented to person, place, and time.      Cranial Nerves: Cranial nerves are intact.     Sensory: Sensation is intact.     Motor: Motor function is intact.     Coordination: Coordination is intact.     Comments: No noted tremor  Psychiatric:        Behavior: Behavior normal.      ED Treatments / Results  Labs (all labs ordered are listed, but only abnormal results are displayed) Labs Reviewed  AMMONIA - Abnormal; Notable for the following components:      Result Value   Ammonia 36 (*)    All other components within normal limits  COMPREHENSIVE METABOLIC PANEL - Abnormal; Notable for the following components:   Potassium 2.4 (*)    Total Protein 6.1 (*)    Albumin 3.1 (*)    Alkaline Phosphatase 129 (*)    All other components within normal limits  CBC WITH DIFFERENTIAL/PLATELET - Abnormal; Notable for the following components:   Hemoglobin 15.2 (*)    Lymphs Abs 0.4 (*)    All other components within normal limits  CBG MONITORING, ED - Abnormal; Notable for the following components:   Glucose-Capillary 105 (*)    All other components within normal limits  I-STAT CHEM 8, ED - Abnormal; Notable for the following components:   Potassium 2.5 (*)    Hemoglobin 16.3 (*)    HCT 48.0 (*)    All other components within normal limits  I-STAT CG4 LACTIC ACID, ED - Abnormal; Notable for the following components:   Lactic Acid, Venous 2.27 (*)    All other components within normal limits  I-STAT BETA HCG BLOOD, ED ([facility], [facility], [facility] ONLY) - Abnormal; Notable for the following components:   I-stat hCG, quantitative 5.9 (*)    All other components within normal limits  URINE CULTURE  ETHANOL  URINALYSIS, ROUTINE W REFLEX MICROSCOPIC  MAGNESIUM  BASIC METABOLIC PANEL  I-STAT VENOUS BLOOD GAS, ED  CBG MONITORING, ED    EKG EKG Interpretation  Date/Time:  Sunday May 03 2018 00:28:09 EST Ventricular Rate:  70 PR Interval:    QRS Duration: 101 QT Interval:  462 QTC Calculation: 499 R Axis:   22 Text Interpretation:   Sinus rhythm Probable left atrial enlargement No significant change since last tracing Confirmed by Deno Etienne [phone]) on 05/03/2018 12:39:00 AM   Radiology Dg Chest 2 View  Result Date: 05/03/2018 CLINICAL DATA:  Seizure activity, fall tonight. History of throat cancer. EXAM: CHEST - 2 VIEW COMPARISON:  Chest radiograph January 17, 2018 FINDINGS: Cardiomediastinal silhouette is normal. No pleural effusions or focal consolidations. Trachea projects midline and there is no pneumothorax. Scattered calcified granuloma. Midthoracic dextroscoliosis. Old RIGHT rib fracture. Soft tissue planes and included osseous structures are non-suspicious. IMPRESSION: 1. No acute cardiopulmonary process. 2. Old granulomatous disease. Electronically Signed   By: Elon Alas M.D.   On: 05/03/2018 01:50   Ct Head Wo Contrast  Result Date: 05/03/2018 CLINICAL DATA:  Seizure activity and fall. EXAM: CT HEAD WITHOUT CONTRAST TECHNIQUE: Contiguous axial images were obtained from the base of the skull through the vertex without intravenous contrast. COMPARISON:  06/01/2017 FINDINGS: Brain: Age advanced cerebral atrophy. No mass lesion, hemorrhage, hydrocephalus, acute infarct, intra-axial, or extra-axial fluid collection. Vascular: Intracranial atherosclerosis. Skull: No significant soft tissue swelling. No skull fracture. Sinuses/Orbits: Normal imaged portions of the orbits and globes. Hypoplastic frontal sinuses. Clear mastoid air cells. Other: None. IMPRESSION: 1. No acute intracranial abnormality. 2. Age advanced cerebral atrophy. Electronically Signed   By: Abigail Miyamoto M.D.   On: 05/03/2018 01:35    Procedures Procedures (including critical care time)  Medications Ordered in ED Medications  potassium chloride SA (K-DUR,KLOR-CON) CR tablet 40 mEq (40 mEq Oral Not Given 05/03/18 0325)  magnesium oxide (MAG-OX) tablet 800 mg (800 mg Oral Not Given 05/03/18 0325)  potassium chloride 10 mEq in 100 mL IVPB (10 mEq  Intravenous New Bag/Given 05/03/18 0333)  levETIRAcetam (KEPPRA) IVPB 1000 mg/100 mL premix (has no administration in time range)  sodium chloride 0.9 % bolus 1,000 mL (1,000 mLs Intravenous New Bag/Given 05/03/18 0334)  magnesium sulfate IVPB 1 g 100 mL (has no administration in time range)  labetalol (NORMODYNE,TRANDATE) injection 10 mg (has no administration in time range)  chlordiazePOXIDE (LIBRIUM) capsule 100 mg (100 mg Oral Given 05/03/18 0202)  prochlorperazine (COMPAZINE) injection 10 mg (10 mg Intravenous Given 05/03/18 0207)  diphenhydrAMINE (BENADRYL) injection 25 mg (25 mg Intravenous Given 05/03/18 0207)  sodium chloride 0.9 % bolus 1,000 mL (0 mLs Intravenous Stopped 05/03/18 0232)     Initial Impression / Assessment and Plan / ED Course  I have reviewed the triage vital signs and the nursing notes.  Pertinent labs & imaging results that were available during my care of the patient were reviewed by me and considered in my medical decision making (see chart for details).     42 yo F with a chief complaint of possible seizure activity.  This will be the patient's second event.  Will obtain a head CT as the patient is also complaining of a terrible headache and there is some thought that she fell.  She has no signs of trauma no neck pain.  We will give a headache cocktail Librium check labs chest x-ray urine discussed with neurology.  Patient found to be hypokalemic initially on the i-STAT K was 2.5 formal metabolic panel notes 2.4.  I do not see any EKG changes though with the patient feeling weak all day there may be the possibility the patient had some sort of arrhythmia that made her have this event.  We will replenish her potassium.  Give magnesium as well.  Will discuss with the hospitalist.  I did discuss the case with Dr. Malen Gauze, neurology he recommended since this is the patient's second seizure to start her on Keppra.  Recommended gram IV load now and 500 twice  daily.  CRITICAL CARE Performed by: Cecilio Asper   Total critical care time: 35 minutes  Critical care time was exclusive of separately billable procedures and treating other patients.  Critical care was necessary to treat or prevent imminent or life-threatening deterioration.  Critical care was time spent personally by me on the following activities: development of treatment plan with patient and/or surrogate as well as nursing, discussions with consultants, evaluation of patient's response to treatment, examination of patient, obtaining history from patient or surrogate, ordering and performing treatments and interventions, ordering and review of laboratory studies, ordering and review of radiographic studies, pulse oximetry and re-evaluation of patient's condition.  The patients results and plan were reviewed and discussed.   Any x-rays performed were independently reviewed by myself.   Differential diagnosis were considered with the presenting HPI.  Medications  potassium chloride SA (K-DUR,KLOR-CON) CR tablet 40 mEq (40 mEq Oral Not Given 05/03/18 0325)  magnesium oxide (MAG-OX) tablet 800 mg (800 mg Oral Not Given 05/03/18 0325)  potassium chloride 10 mEq in 100 mL IVPB (10 mEq Intravenous New Bag/Given 05/03/18 0333)  levETIRAcetam (KEPPRA) IVPB 1000 mg/100 mL premix (has no administration in time range)  sodium chloride 0.9 % bolus 1,000 mL (1,000 mLs Intravenous New Bag/Given 05/03/18 0334)  magnesium sulfate IVPB 1 g 100 mL (has no administration in time range)  labetalol (NORMODYNE,TRANDATE) injection 10 mg (has no administration in time range)  chlordiazePOXIDE (LIBRIUM) capsule 100 mg (100 mg Oral Given 05/03/18 0202)  prochlorperazine (COMPAZINE) injection 10 mg (10 mg Intravenous Given 05/03/18 0207)  diphenhydrAMINE (BENADRYL) injection 25 mg (25 mg Intravenous Given 05/03/18 0207)  sodium chloride 0.9 % bolus 1,000 mL (0 mLs Intravenous Stopped 05/03/18 0232)     Vitals:   05/03/18 0100 05/03/18 0145 05/03/18 0200 05/03/18 0215  BP: (!) 171/123 (!) 175/109 (!) 170/110 (!) 177/106  Pulse: 80 75 84 78  Resp:  19 16 20   Temp:      TempSrc:      SpO2: 97% 98% 100% 100%    Final diagnoses:  Seizure-like activity ([place])  Hypokalemia    Admission/ observation were discussed with the admitting physician, patient and/or family and they are comfortable with the plan.    Final Clinical Impressions(s) / ED Diagnoses   Final diagnoses:  Seizure-like activity [facility])  Hypokalemia    ED Discharge Orders    None       Deno Etienne, DO 05/03/18 0400

## 2018-05-03 NOTE — ED Triage Notes (Signed)
Pt arrives to ED from home with complaints of seizure activity and fall tonight. EMS reports pt young children witnessed pt fall and shake, foaming at the mouth. When EMS arrived pt was unconscious for about 3 minutes. Pt currently alert but disoriented. Pt states she feels dizzy and has a migraine. EMS reports pt would continue to repeat questions in transport. Pt placed in position of comfort with bed locked and lowered, call bell in reach.

## 2018-05-03 NOTE — Progress Notes (Signed)
Hypoglycemic Event  CBG: 70 - with lethargy   Treatment: D50 25 mL (12.5 gm)  Symptoms: Lethargy  Follow-up CBG: Time: 0705 CBG Result:222 Possible Reasons for Event: Unknown  Comments/MD notified: [facility] provider notified    Arlis Porta

## 2018-05-03 NOTE — Progress Notes (Signed)
Patient admitted after midnight, please see H&P.  This AM was lethargic-- would open eyes and fall back asleep.  Around 1 PM, began to wake up was able to eat lunch.  Keppra started for seizures.  Plan to replace K/Mg. Recheck labs in AM  [employer] DO

## 2018-05-03 NOTE — H&P (Signed)
History and Physical    Crystal Spence HCW:[id number] DOB: [birth year] DOA: 05/03/2018  PCP: Trey Sailors, PA   Patient coming from: Home   Chief Complaint: Seizure-like activity   HPI: Crystal Spence is a 42 y.o. female with medical history significant for laryngeal cancer now cancer free per oncology notes, tobacco and alcohol abuse, chronic headaches, and hypertension, now presenting to the emergency department for evaluation of seizure-like activity.  EMS was called out for reports of a fall with generalized shaking, "foaming at the mouth," and found the patient to be poorly responsive for approximately 3 minutes.  She had been admitted to the hospital in January with seizure-like activity, thought to be secondary to alcohol withdrawal, and the patient reports that she has cut back on her drinking since that time, but continues to drink excessively, though reports that it is not daily.  She reported a headache in the ED, worse in her usual headaches.  She denied any difficulty with her speech or swallowing, denied neck pain, and denied nausea, vomiting, or diarrhea.  ED Course: Upon arrival to the ED, patient is found to be afebrile, saturating well on room air, hypertensive to 180/110, and vitals otherwise normal.  EKG features a sinus rhythm, chest x-ray notable for old granulomatous disease but no acute findings, and noncontrast head CT also negative for acute findings but notable for age-advanced cerebral atrophy.  Chemistry panel notable for potassium of 2.4.  Ammonia level 36.  Ethanol undetectable.  CBC with clumped platelets.  Lactic acid 2.27.  Patient was given 2 L normal saline, IV potassium, oral magnesium, and headache cocktail in the ED.  ED physician discussed the case with neurology and it was recommended that the patient be loaded with Keppra and then continued on 500 mg twice daily.  Patient remains somnolent, but hemodynamically stable and in no apparent respiratory  distress, and she will be observed on the telemetry unit for ongoing evaluation and management of recurrent seizure-like activity.  Review of Systems:  All other systems reviewed and apart from HPI, are negative.  Past Medical History:  Diagnosis Date  . Anemia, unspecified 06/17/2013  . Headache   . History of laryngeal cancer 02/03/2013  . Hypertension   . Hypothyroidism (acquired) 06/17/2013  . Multiple lung nodules 06/28/2014  . Pneumonia 01/14/2014, 04/2017  . Poor oral hygiene 06/17/2013  . Rib pain 01/14/2014  . Rib pain on left side 01/05/2014  . Throat cancer [facility])     Past Surgical History:  Procedure Laterality Date  . ABDOMINAL SURGERY    . BIOPSY  01/18/2018   Procedure: BIOPSY;  Surgeon: Wonda Horner, MD;  Location: [place];  Service: Endoscopy;;  . COLONOSCOPY WITH PROPOFOL N/A 10/12/2015   Procedure: COLONOSCOPY WITH PROPOFOL;  Surgeon: Wonda Horner, MD;  Location: [facility] ENDOSCOPY;  Service: Endoscopy;  Laterality: N/A;  . ESOPHAGOGASTRODUODENOSCOPY N/A 02/03/2013   Procedure: ESOPHAGOGASTRODUODENOSCOPY (EGD);  Surgeon: Juanita Craver, MD;  Location: [facility] ENDOSCOPY;  Service: Endoscopy;  Laterality: N/A;  . ESOPHAGOGASTRODUODENOSCOPY N/A 10/12/2015   Procedure: ESOPHAGOGASTRODUODENOSCOPY (EGD);  Surgeon: Wonda Horner, MD;  Location: [facility];  Service: Endoscopy;  Laterality: N/A;  . ESOPHAGOGASTRODUODENOSCOPY (EGD) WITH PROPOFOL N/A 01/18/2018   Procedure: ESOPHAGOGASTRODUODENOSCOPY (EGD) WITH PROPOFOL;  Surgeon: Wonda Horner, MD;  Location: [facility] ENDOSCOPY;  Service: Endoscopy;  Laterality: N/A;  . 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 TRACHEOSTOMY     for throat inflammation from radiation  . TUBAL LIGATION       reports that she quit smoking about 7 years ago. She has a 15.00 pack-year smoking history. She has never used smokeless tobacco.  She reports current alcohol use of about 2.0 standard drinks of alcohol per week. She reports that she does not use drugs.  No Known Allergies  Family History  Problem Relation Age of Onset  . Cancer Maternal Grandmother        skin cancer     Prior to Admission medications   Medication Sig Start Date End Date Taking? Authorizing Provider  albuterol (PROVENTIL HFA;VENTOLIN HFA) 108 (90 Base) MCG/ACT inhaler Inhale 2 puffs into the lungs every 2 (two) hours as needed for wheezing or shortness of breath (cough). 02/20/17   Ripley Fraise, MD  clonazePAM (KLONOPIN) 1 MG tablet Take 1 mg by mouth daily as needed for anxiety.  12/15/15   [provider]  diltiazem (CARDIZEM CD) 240 MG 24 hr capsule Take 240 mg by mouth daily. 11/21/17   [provider]  fluticasone (FLONASE) 50 MCG/ACT nasal spray Place 2 sprays into both nostrils daily. Patient not taking: Reported on 01/17/2018 06/06/17   Eugenie Filler, MD  folic acid (FOLVITE) 1 MG tablet Take 1 tablet (1 mg total) by mouth daily. 06/06/17   Eugenie Filler, MD  hydrOXYzine (VISTARIL) 50 MG capsule Take 50 mg by mouth at bedtime. 11/21/17   [provider]  levothyroxine (SYNTHROID, LEVOTHROID) 25 MCG tablet TAKE 1 TABLET(25 MCG) BY MOUTH DAILY BEFORE BREAKFAST Patient taking differently: Take 25 mcg by mouth daily before breakfast.  11/03/17   Heath Lark, MD  losartan (COZAAR) 25 MG tablet Take 25 mg by mouth daily. 11/21/17   [provider]  Multiple Vitamin (MULTIVITAMIN WITH MINERALS) TABS tablet Take 1 tablet by mouth daily. Patient not taking: Reported on 01/17/2018 06/06/17   Eugenie Filler, MD  pantoprazole (PROTONIX) 40 MG tablet Take 1 tablet (40 mg total) by mouth daily. 01/19/18   Jean Rosenthal, MD  polyethylene glycol (MIRALAX / GLYCOLAX) packet Take 17 g by mouth 2 (two) times daily. Patient taking differently: Take 17 g by mouth daily as needed for mild constipation.  06/05/17   Eugenie Filler, MD  potassium chloride SA (K-DUR,KLOR-CON) 20 MEQ tablet Take 10-20 mEq by mouth See admin instructions. Take 1 tablet (92mEq) by mouth in the morning; take 10 mEq by mouth in the evening 08/29/14   [provider]  senna-docusate (SENOKOT-S) 8.6-50 MG tablet Take 1 tablet by mouth at bedtime. Patient taking differently: Take 1 tablet by mouth at bedtime as needed for mild constipation.  06/05/17   Eugenie Filler, MD  thiamine 100 MG tablet Take 1 tablet (100 mg total) by mouth daily. 06/06/17   Eugenie Filler, MD  topiramate (TOPAMAX) 50 MG tablet Take 100 mg by mouth 2 (two) times daily.     [provider]  traZODone (DESYREL) 100 MG tablet Take 100 mg by mouth at bedtime. 08/15/15   [provider]    Physical Exam: Vitals:   05/03/18 0400 05/03/18 0415 05/03/18 0426 05/03/18 0458  BP: (!) 174/110 (!) 147/106  (!) 159/107  Pulse: 85 80  78  Resp: (!) 23 18  17   Temp:   98.5 F (36.9 C) 98.5 F (36.9 C)  TempSrc:    Oral  SpO2: 99% 98%  98%    Constitutional: NAD, calm, cachectic  Eyes: PERTLA, lids and conjunctivae normal ENMT: Mucous membranes are moist. Posterior pharynx clear of any exudate or lesions.   Neck: normal, supple, no masses, no thyromegaly Respiratory: clear to auscultation bilaterally, no wheezing, no crackles. Normal respiratory effort.   Cardiovascular: S1 & S2 heard, regular rate and rhythm. No extremity edema.   Abdomen: No distension, no tenderness, soft. Bowel sounds active.  Musculoskeletal: no clubbing / cyanosis. No joint deformity upper and lower extremities.    Skin: Small wound at dorsal right forefoot and toes with crust. Poor turgor.  Neurologic: Somnolent. No facial asymmetry. Patellar DTRs intact. Moving all extremities.    Labs on Admission: I have personally reviewed following labs and imaging studies  CBC: Recent Labs  Lab 05/03/18 0132 05/03/18 0156  WBC 4.4  --   NEUTROABS 3.3  --   HGB 15.2*  16.3*  HCT 44.5 48.0*  MCV 92.7  --   PLT PLATELET CLUMPS NOTED ON SMEAR, COUNT APPEARS ADEQUATE  --    Basic Metabolic Panel: Recent Labs  Lab 05/03/18 0132 05/03/18 0156  NA 137 135  K 2.4* 2.5*  CL 99 99  CO2 23  --   GLUCOSE 98 97  BUN 7 8  CREATININE 0.97 0.80  CALCIUM 9.5  --    GFR: CrCl cannot be calculated (Unknown ideal weight.). Liver Function Tests: Recent Labs  Lab 05/03/18 0132  AST 34  ALT 20  ALKPHOS 129*  BILITOT 0.8  PROT 6.1*  ALBUMIN 3.1*   No results for input(s): LIPASE, AMYLASE in the last 168 hours. Recent Labs  Lab 05/03/18 0132  AMMONIA 36*   Coagulation Profile: No results for input(s): INR, PROTIME in the last 168 hours. Cardiac Enzymes: No results for input(s): CKTOTAL, CKMB, CKMBINDEX, TROPONINI in the last 168 hours. BNP (last 3 results) No results for input(s): PROBNP in the last 8760 hours. HbA1C: No results for input(s): HGBA1C in the last 72 hours. CBG: Recent Labs  Lab 05/03/18 0211  GLUCAP 105*   Lipid Profile: No results for input(s): CHOL, HDL, LDLCALC, TRIG, CHOLHDL, LDLDIRECT in the last 72 hours. Thyroid Function Tests: No results for input(s): TSH, T4TOTAL, FREET4, T3FREE, THYROIDAB in the last 72 hours. Anemia Panel: No results for input(s): VITAMINB12, FOLATE, FERRITIN, TIBC, IRON, RETICCTPCT in the last 72 hours. Urine analysis:    Component Value Date/Time   COLORURINE YELLOW 06/01/2017 [place] 06/01/2017 1428   LABSPEC 1.010 06/01/2017 1428   PHURINE 6.0 06/01/2017 1428   GLUCOSEU NEGATIVE 06/01/2017 1428   HGBUR NEGATIVE 06/01/2017 1428   BILIRUBINUR NEGATIVE 06/01/2017 1428   [place] 06/01/2017 1428   PROTEINUR NEGATIVE 06/01/2017 1428   UROBILINOGEN 0.2 12/06/2013 1718   NITRITE NEGATIVE 06/01/2017 1428   LEUKOCYTESUR NEGATIVE 06/01/2017 1428   Sepsis Labs: @LABRCNTIP (procalcitonin:4,lacticidven:4) )No results found for this or any previous visit (from the past  240 hour(s)).   Radiological Exams on Admission: Dg Chest 2 View  Result Date: 05/03/2018 CLINICAL DATA:  Seizure activity, fall tonight. History of throat cancer. EXAM: CHEST - 2 VIEW COMPARISON:  Chest radiograph January 17, 2018 FINDINGS: Cardiomediastinal silhouette is normal. No pleural effusions or focal consolidations. Trachea projects midline and there is no pneumothorax. Scattered calcified granuloma. Midthoracic dextroscoliosis. Old RIGHT rib fracture. Soft tissue planes and included osseous structures are non-suspicious. IMPRESSION: 1. No acute cardiopulmonary process. 2. Old granulomatous disease. Electronically Signed   By: Thana Farr.D.  On: 05/03/2018 01:50   Ct Head Wo Contrast  Result Date: 05/03/2018 CLINICAL DATA:  Seizure activity and fall. EXAM: CT HEAD WITHOUT CONTRAST TECHNIQUE: Contiguous axial images were obtained from the base of the skull through the vertex without intravenous contrast. COMPARISON:  06/01/2017 FINDINGS: Brain: Age advanced cerebral atrophy. No mass lesion, hemorrhage, hydrocephalus, acute infarct, intra-axial, or extra-axial fluid collection. Vascular: Intracranial atherosclerosis. Skull: No significant soft tissue swelling. No skull fracture. Sinuses/Orbits: Normal imaged portions of the orbits and globes. Hypoplastic frontal sinuses. Clear mastoid air cells. Other: None. IMPRESSION: 1. No acute intracranial abnormality. 2. Age advanced cerebral atrophy. Electronically Signed   By: Abigail Miyamoto M.D.   On: 05/03/2018 01:35    EKG: Independently reviewed. Sinus rhythm.   Assessment/Plan   1. Seizure  - Presents following acute episode of LOC with generalized shaking, postictal with EMS  - Head CT without acute findings, does not appear to be in withdrawal, no focal neuro deficits or meningismus  - Electrolytes replaced in ED  - Loaded with Keppra 1 g in ED and to be continued on 500 mg BID per neurology recommendations  - Continue seizure  precautions, continue Keppra, repeat chem panel in am, check UDS and TSH   2. Hypokalemia  - Serum potassium is 2.4 on admission, treated with 20 mEq IV potassium and oral potassium in ED  - Given empiric mag in light of seizure  - Continue cardiac monitoring, repeat serum electrolytes in am    3. Alcohol abuse  - Reports cutting back since admission with suspected withdrawal seizure in January, continues to drink excessively but states no longer daily  - Does not appear to be withdrawing on admission  - Monitor with CIWA, continue vitamin supplements    4. Hypertensive urgency  - DBP persisting low 100's  - Continue diltiazem and losartan, use labetalol IVP's as-needed   5. Hypothyroidism  - Continue Synthroid    6. Chronic headaches  - Continue suppressive Topomax   7. Peptic ulcer disease  - Admitted with UGIB in September, found to have gastric ulcers; H pylori was negative  - Hgb has recovered  - Continue NSAID avoidance, continue daily PPI     DVT prophylaxis: SCD's  Code Status: Full  Family Communication: Discussed with patient  Consults called: None Admission status: Observation     Vianne Bulls, MD [facility] Pager [phone]  If 7PM-7AM, please contact night-coverage [email or web address] Password [facility]  05/03/2018, 5:05 AM

## 2018-05-04 DIAGNOSIS — R51 Headache: Secondary | ICD-10-CM | POA: Diagnosis not present

## 2018-05-04 DIAGNOSIS — R569 Unspecified convulsions: Secondary | ICD-10-CM | POA: Diagnosis not present

## 2018-05-04 DIAGNOSIS — I10 Essential (primary) hypertension: Secondary | ICD-10-CM | POA: Diagnosis not present

## 2018-05-04 DIAGNOSIS — F1010 Alcohol abuse, uncomplicated: Secondary | ICD-10-CM | POA: Diagnosis not present

## 2018-05-04 LAB — BASIC METABOLIC PANEL
Anion gap: 3 — ABNORMAL LOW (ref 5–15)
BUN: 5 mg/dL — ABNORMAL LOW (ref 6–20)
CO2: 22 mmol/L (ref 22–32)
CREATININE: 0.9 mg/dL (ref 0.44–1.00)
Calcium: 8.4 mg/dL — ABNORMAL LOW (ref 8.9–10.3)
Chloride: 109 mmol/L (ref 98–111)
GFR calc non Af Amer: >60 mL/min (ref >60)
Glucose, Bld: 79 mg/dL (ref 70–99)
Potassium: 4.6 mmol/L (ref 3.5–5.1)
Sodium: 134 mmol/L — ABNORMAL LOW (ref 135–145)

## 2018-05-04 LAB — CBC
HCT: 37.2 % (ref 36.0–46.0)
Hemoglobin: 12.4 g/dL (ref 12.0–15.0)
MCH: 32 pg (ref 26.0–34.0)
MCHC: 33.3 g/dL (ref 30.0–36.0)
MCV: 96.1 fL (ref 80.0–100.0)
Platelets: 277 10*3/uL (ref 150–400)
RBC: 3.87 MIL/uL (ref 3.87–5.11)
RDW: 13.4 % (ref 11.5–15.5)
WBC: 2.9 10*3/uL — ABNORMAL LOW (ref 4.0–10.5)
nRBC: 0 % (ref 0.0–0.2)

## 2018-05-04 MED ORDER — LEVETIRACETAM 500 MG PO TABS: 500.0000 mg | ORAL_TABLET | Freq: Two times a day (BID) | ORAL | 0 refills | Status: DC

## 2018-05-04 MED ORDER — LEVOTHYROXINE SODIUM 50 MCG PO TABS: 75.0000 ug | ORAL_TABLET | Freq: Every day | ORAL | 0 refills | Status: DC

## 2018-05-04 NOTE — Progress Notes (Signed)
On morning assessment, pt's BP was 76/58. Dr. Sloan Leiter notified. Nurse was instructed was walk pt and retake BP. BP was then 91/73. Nurse was instructed to hold all BP medications. At 1200, pt's BP was 106/76. [place]

## 2018-05-04 NOTE — Progress Notes (Signed)
Patient is discharging home. Discharge paperwork went over the patient and father. All questions and concerns addressed. All belongings sent with patient. Patient's father here for transport. [place]

## 2018-05-04 NOTE — Discharge Summary (Addendum)
PATIENT DETAILS Name: Crystal Spence Age: 42 y.o. Sex: female Date of Birth: January 31, 1976 MRN: [id number]. Admitting Physician: Vianne Bulls, MD YQM:VHQIONGE, [name], [place]  Admit Date: 05/03/2018 Discharge date: 05/04/2018  Recommendations for Outpatient Follow-up:  1. Follow up with PCP in 1-2 weeks 2. Please obtain BMP/CBC in one week 3. TSH was significantly elevated (more than 100) dosage of levothyroxine has been increased to 75 mcg, please recheck TSH in 3 months. 4. Please ensure follow-up with neurology.   Admitted From:  Home   Disposition: [place]: No  Equipment/Devices: None  Discharge Condition: Stable  CODE STATUS: FULL CODE  Diet recommendation:  Heart Healthy   Brief Summary: See H&P, Labs, Consult and Test reports for all details in brief, patient is a 42 year old female with history of laryngeal cancer-apparently now in remission, ongoing tobacco/alcohol use, migraine headaches, hypertension-presented with seizure-like activity.  Case was discussed with neurology on the phone by ED MD-patient was loaded with Keppra and admitted to the hospitalist service.  See below for further details  Brief Hospital Course: Seizures: Has had prior seizures in the past-apparently these were in the setting of alcohol use.  ED MD discussed case with neurology on-call over the phone and was started on [place].  This MD reached out to neurology on-call-Dr. Aroor-I discussed the case with him-he recommends that we continue Keppra on discharge, and have patient follow-up with outpatient neurology.  A epic referral to [facility] neurology has been sent.  Patient has been instructed not to drive-see seizure instructions below.  CT of the head did not show any acute abnormalities.  Hypothyroidism: TSH significantly elevated at 116.5.  Patient reluctantly acknowledged that she has not been very compliant with medications, and claims she has missed a few doses here  and there.  Will increase levothyroxine to 75 mcg (was on 25 mcg at home)-she will need follow-up with her endocrinologist in the outpatient setting.  Alcohol use: Claims her last drink was approximately 3-4 days back-apparently she has cut down on her drinking and only drinks 3-4 times a week.  She was counseled regarding importance of abstinence from alcohol given seizures.  Note-this morning she is completely awake and alert-she does not have any tremors, and is requesting discharge-as she needs to get ready for the holidays  Hypokalemia: Likely secondary to alcohol use: Replete and recheck.  Migraine headaches: Continue Topamax  Laryngeal cancer: Continue follow-up with oncology in the outpatient setting  Hypertension: Briefly hypotensive this morning but blood pressure rebounded spontaneously.  He was asymptomatic-not sure if this was due to the wrong cuff size.  In any event-patient's blood pressure remains to be on the softer side of things-better to hold losartan until she sees a primary care practitioner.  Procedures/Studies: None  Discharge Diagnoses:  Principal Problem:   Seizure-like activity ([place]) Active Problems:   Hypokalemia   Alcohol abuse   Hypothyroidism (acquired)   Essential hypertension   Hypertensive urgency   Chronic headaches   Discharge Instructions:  Activity:  As tolerated   Discharge Instructions    Ambulatory referral to Neurology   Complete by:  As directed    An appointment is requested in approximately: 2 weeks   Call MD for:  difficulty breathing, headache or visual disturbances   Complete by:  As directed    Call MD for:  severe uncontrolled pain   Complete by:  As directed    Diet - low sodium heart healthy   Complete  by:  As directed    Discharge instructions   Complete by:  As directed     Per [facility] statutes, patients with seizures are not allowed to drive until they have been seizure-free for six months. Use caution  when using heavy equipment or power tools. Avoid working on ladders or at heights. Take showers instead of baths. Ensure the water temperature is not too high on the home water heater. Do not go swimming alone. When caring for infants or small children, sit down when holding, feeding, or changing them to minimize risk of injury to the child in the event you have a seizure.  Your TSH is significantly elevated-we have increased your levothyroxine to 75 mcg daily.  Please follow-up with your primary care practitioner as soon as possible.  Please stop drinking alcohol!!  Follow with Primary MD  Trey Sailors, PA in 1 week  Please follow-up with neurology-[facility] should be calling you with an appointment soon.  If you do not hear from them, please give them a call within 1 week.  Please get a complete blood count and chemistry panel checked by your Primary MD at your next visit, and again as instructed by your Primary MD.  Get Medicines reviewed and adjusted: Please take all your medications with you for your next visit with your Primary MD  Laboratory/radiological data: Please request your Primary MD to go over all hospital tests and procedure/radiological results at the follow up, please ask your Primary MD to get all Hospital records sent to his/her office.  In some cases, they will be blood work, cultures and biopsy results pending at the time of your discharge. Please request that your primary care M.D. follows up on these results.  Also Note the following: If you experience worsening of your admission symptoms, develop shortness of breath, life threatening emergency, suicidal or homicidal thoughts you must seek medical attention immediately by calling 911 or calling your MD immediately  if symptoms less severe.  You must read complete instructions/literature along with all the possible adverse reactions/side effects for all the Medicines you take and that have been  prescribed to you. Take any new Medicines after you have completely understood and accpet all the possible adverse reactions/side effects.   Do not drive when taking Pain medications or sleeping medications (Benzodaizepines)  Do not take more than prescribed Pain, Sleep and Anxiety Medications. It is not advisable to combine anxiety,sleep and pain medications without talking with your primary care practitioner  Special Instructions: If you have smoked or chewed Tobacco  in the last 2 yrs please stop smoking, stop any regular Alcohol  and or any Recreational drug use.  Wear Seat belts while driving.  Please note: You were cared for by a hospitalist during your hospital stay. Once you are discharged, your primary care physician will handle any further medical issues. Please note that NO REFILLS for any discharge medications will be authorized once you are discharged, as it is imperative that you return to your primary care physician (or establish a relationship with a primary care physician if you do not have one) for your post hospital discharge needs so that they can reassess your need for medications and monitor your lab values.   Increase activity slowly   Complete by:  As directed      Allergies as of 05/04/2018   No Known Allergies     Medication List    STOP taking these medications   losartan  25 MG tablet Commonly known as:  COZAAR     TAKE these medications   albuterol 108 (90 Base) MCG/ACT inhaler Commonly known as:  PROVENTIL HFA;VENTOLIN HFA Inhale 2 puffs into the lungs every 2 (two) hours as needed for wheezing or shortness of breath (cough).   clonazePAM 1 MG tablet Commonly known as:  KLONOPIN Take 1 mg by mouth daily as needed for anxiety.   diltiazem 240 MG 24 hr capsule Commonly known as:  CARDIZEM CD Take 240 mg by mouth daily.   fluticasone 50 MCG/ACT nasal spray Commonly known as:  FLONASE Place 2 sprays into both nostrils daily.   folic acid 1 MG  tablet Commonly known as:  FOLVITE Take 1 tablet (1 mg total) by mouth daily.   hydrOXYzine 50 MG capsule Commonly known as:  VISTARIL Take 50 mg by mouth at bedtime.   levETIRAcetam 500 MG tablet Commonly known as:  KEPPRA Take 1 tablet (500 mg total) by mouth 2 (two) times daily.   levothyroxine 50 MCG tablet Commonly known as:  SYNTHROID, LEVOTHROID Take 1.5 tablets (75 mcg total) by mouth daily before breakfast. What changed:    medication strength  See the new instructions.   multivitamin with minerals Tabs tablet Take 1 tablet by mouth daily.   pantoprazole 40 MG tablet Commonly known as:  PROTONIX Take 1 tablet (40 mg total) by mouth daily.   polyethylene glycol packet Commonly known as:  MIRALAX / GLYCOLAX Take 17 g by mouth 2 (two) times daily. What changed:    when to take this  reasons to take this   potassium chloride SA 20 MEQ tablet Commonly known as:  K-DUR,KLOR-CON Take 10-20 mEq by mouth See admin instructions. Take 1 tablet (48mEq) by mouth in the morning; take 10 mEq by mouth in the evening   senna-docusate 8.6-50 MG tablet Commonly known as:  Senokot-S Take 1 tablet by mouth at bedtime. What changed:    when to take this  reasons to take this   thiamine 100 MG tablet Take 1 tablet (100 mg total) by mouth daily.   topiramate 50 MG tablet Commonly known as:  TOPAMAX Take 100 mg by mouth 2 (two) times daily.   traZODone 100 MG tablet Commonly known as:  DESYREL Take 100 mg by mouth at bedtime.      Follow-up Information    Trey Sailors, [place]. Schedule an appointment as soon as possible for a visit in 1 week(s).   Specialty:  Physician Assistant Contact information: [place] [address] [phone]        [facility] Follow up.   Why:  Office will call you with an appointment, if you do not hear from them-please give them a call. Contact information: [address]     [place] [id number] [phone]         No Known Allergies   Consultations:   None   Other Procedures/Studies: Dg Chest 2 View  Result Date: 05/03/2018 CLINICAL DATA:  Seizure activity, fall tonight. History of throat cancer. EXAM: CHEST - 2 VIEW COMPARISON:  Chest radiograph January 17, 2018 FINDINGS: Cardiomediastinal silhouette is normal. No pleural effusions or focal consolidations. Trachea projects midline and there is no pneumothorax. Scattered calcified granuloma. Midthoracic dextroscoliosis. Old RIGHT rib fracture. Soft tissue planes and included osseous structures are non-suspicious. IMPRESSION: 1. No acute cardiopulmonary process. 2. Old granulomatous disease. Electronically Signed   By: Thana Farr.D.  On: 05/03/2018 01:50   Ct Head Wo Contrast  Result Date: 05/03/2018 CLINICAL DATA:  Seizure activity and fall. EXAM: CT HEAD WITHOUT CONTRAST TECHNIQUE: Contiguous axial images were obtained from the base of the skull through the vertex without intravenous contrast. COMPARISON:  06/01/2017 FINDINGS: Brain: Age advanced cerebral atrophy. No mass lesion, hemorrhage, hydrocephalus, acute infarct, intra-axial, or extra-axial fluid collection. Vascular: Intracranial atherosclerosis. Skull: No significant soft tissue swelling. No skull fracture. Sinuses/Orbits: Normal imaged portions of the orbits and globes. Hypoplastic frontal sinuses. Clear mastoid air cells. Other: None. IMPRESSION: 1. No acute intracranial abnormality. 2. Age advanced cerebral atrophy. Electronically Signed   By: Abigail Miyamoto M.D.   On: 05/03/2018 01:35     TODAY-DAY OF DISCHARGE:  Subjective:   Crystal Spence today has no headache,no chest abdominal pain,no new weakness tingling or numbness, feels much better wants to go home today.   Objective:   Blood pressure 98/77, pulse (!) 57, temperature 97.6 F (36.4 C), temperature source Axillary, resp. rate 16, weight 38.6  kg, last menstrual period 10/20/2012, SpO2 100 %. No intake or output data in the 24 hours ending 05/04/18 1334 Filed Weights   05/03/18 0458  Weight: 38.6 kg    Exam: Awake Alert, Oriented *3, No new F.N deficits, Normal affect [place].AT,PERRAL Supple Neck,No JVD, No cervical lymphadenopathy appriciated.  Symmetrical Chest wall movement, Good air movement bilaterally, CTAB RRR,No Gallops,Rubs or new Murmurs, No Parasternal Heave +ve B.Sounds, Abd Soft, Non tender, No organomegaly appriciated, No rebound -guarding or rigidity. No Cyanosis, Clubbing or edema, No new Rash or bruise   PERTINENT RADIOLOGIC STUDIES: Dg Chest 2 View  Result Date: 05/03/2018 CLINICAL DATA:  Seizure activity, fall tonight. History of throat cancer. EXAM: CHEST - 2 VIEW COMPARISON:  Chest radiograph January 17, 2018 FINDINGS: Cardiomediastinal silhouette is normal. No pleural effusions or focal consolidations. Trachea projects midline and there is no pneumothorax. Scattered calcified granuloma. Midthoracic dextroscoliosis. Old RIGHT rib fracture. Soft tissue planes and included osseous structures are non-suspicious. IMPRESSION: 1. No acute cardiopulmonary process. 2. Old granulomatous disease. Electronically Signed   By: Elon Alas M.D.   On: 05/03/2018 01:50   Ct Head Wo Contrast  Result Date: 05/03/2018 CLINICAL DATA:  Seizure activity and fall. EXAM: CT HEAD WITHOUT CONTRAST TECHNIQUE: Contiguous axial images were obtained from the base of the skull through the vertex without intravenous contrast. COMPARISON:  06/01/2017 FINDINGS: Brain: Age advanced cerebral atrophy. No mass lesion, hemorrhage, hydrocephalus, acute infarct, intra-axial, or extra-axial fluid collection. Vascular: Intracranial atherosclerosis. Skull: No significant soft tissue swelling. No skull fracture. Sinuses/Orbits: Normal imaged portions of the orbits and globes. Hypoplastic frontal sinuses. Clear mastoid air cells. Other: None.  IMPRESSION: 1. No acute intracranial abnormality. 2. Age advanced cerebral atrophy. Electronically Signed   By: Abigail Miyamoto M.D.   On: 05/03/2018 01:35     PERTINENT LAB RESULTS: CBC: Recent Labs    05/03/18 0132 05/03/18 0156 05/04/18 0440  WBC 4.4  --  2.9*  HGB 15.2* 16.3* 12.4  HCT 44.5 48.0* 37.2  PLT PLATELET CLUMPS NOTED ON SMEAR, COUNT APPEARS ADEQUATE  --  277   CMET CMP     Component Value Date/Time   NA 134 (L) 05/04/2018 0440   NA 138 12/14/2015 1412   K 4.6 05/04/2018 0440   K 3.8 12/14/2015 1412   CL 109 05/04/2018 0440   CO2 22 05/04/2018 0440   CO2 20 (L) 12/14/2015 1412   GLUCOSE 79 05/04/2018 0440   GLUCOSE 77  12/14/2015 1412   BUN 5 (L) 05/04/2018 0440   BUN 4.6 (L) 12/14/2015 1412   CREATININE 0.90 05/04/2018 0440   CREATININE 0.7 12/14/2015 1412   CALCIUM 8.4 (L) 05/04/2018 0440   CALCIUM 9.4 12/14/2015 1412   PROT 6.1 (L) 05/03/2018 0132   PROT 7.3 12/14/2015 1412   ALBUMIN 3.1 (L) 05/03/2018 0132   ALBUMIN 3.8 12/14/2015 1412   AST 34 05/03/2018 0132   AST 107 (H) 12/14/2015 1412   ALT 20 05/03/2018 0132   ALT 27 12/14/2015 1412   ALKPHOS 129 (H) 05/03/2018 0132   ALKPHOS 178 (H) 12/14/2015 1412   BILITOT 0.8 05/03/2018 0132   BILITOT <0.30 12/14/2015 1412   GFRNONAA >60 05/04/2018 0440   GFRAA >60 05/04/2018 0440    GFR Estimated Creatinine Clearance: 48.6 mL/min (by C-G formula based on SCr of 0.9 mg/dL). No results for input(s): LIPASE, AMYLASE in the last 72 hours. No results for input(s): CKTOTAL, CKMB, CKMBINDEX, TROPONINI in the last 72 hours. Invalid input(s): POCBNP No results for input(s): DDIMER in the last 72 hours. No results for input(s): HGBA1C in the last 72 hours. No results for input(s): CHOL, HDL, LDLCALC, TRIG, CHOLHDL, LDLDIRECT in the last 72 hours. Recent Labs    05/03/18 1220  TSH 116.578*   No results for input(s): VITAMINB12, FOLATE, FERRITIN, TIBC, IRON, RETICCTPCT in the last 72 hours. Coags: No  results for input(s): INR in the last 72 hours.  Invalid input(s): PT Microbiology: Recent Results (from the past 240 hour(s))  Urine culture     Status: Abnormal (Preliminary result)   Collection Time: 05/03/18 12:54 AM  Result Value Ref Range Status   Specimen Description URINE, RANDOM  Final   Special Requests   Final    NONE Performed at [facility], [address] [place], [place] [address]    Culture 50,000 COLONIES/mL GRAM NEGATIVE RODS (A)  Final   Report Status PENDING  Incomplete    FURTHER DISCHARGE INSTRUCTIONS:  Get Medicines reviewed and adjusted: Please take all your medications with you for your next visit with your Primary MD  Laboratory/radiological data: Please request your Primary MD to go over all hospital tests and procedure/radiological results at the follow up, please ask your Primary MD to get all Hospital records sent to his/her office.  In some cases, they will be blood work, cultures and biopsy results pending at the time of your discharge. Please request that your primary care M.D. goes through all the records of your hospital data and follows up on these results.  Also Note the following: If you experience worsening of your admission symptoms, develop shortness of breath, life threatening emergency, suicidal or homicidal thoughts you must seek medical attention immediately by calling 911 or calling your MD immediately  if symptoms less severe.  You must read complete instructions/literature along with all the possible adverse reactions/side effects for all the Medicines you take and that have been prescribed to you. Take any new Medicines after you have completely understood and accpet all the possible adverse reactions/side effects.   Do not drive when taking Pain medications or sleeping medications (Benzodaizepines)  Do not take more than prescribed Pain, Sleep and Anxiety Medications. It is not advisable to combine anxiety,sleep and pain  medications without talking with your primary care practitioner  Special Instructions: If you have smoked or chewed Tobacco  in the last 2 yrs please stop smoking, stop any regular Alcohol  and or any Recreational drug use.  Wear Seat belts while driving.  Please note: You were cared for by a hospitalist during your hospital stay. Once you are discharged, your primary care physician will handle any further medical issues. Please note that NO REFILLS for any discharge medications will be authorized once you are discharged, as it is imperative that you return to your primary care physician (or establish a relationship with a primary care physician if you do not have one) for your post hospital discharge needs so that they can reassess your need for medications and monitor your lab values.  Total Time spent coordinating discharge including counseling, education and face to face time equals 35 minutes.  SignedOren Binet 05/04/2018 1:34 PM

## 2018-05-05 LAB — URINE CULTURE: Culture: 50000 — AB

## 2018-05-19 ENCOUNTER — Emergency Department (HOSPITAL_COMMUNITY)
Admission: EM | Admit: 2018-05-19 | Discharge: 2018-05-19 | Disposition: A | Discharge status: Home / Self Care | Payer: Medicaid Other | Attending: Emergency Medicine | Admitting: Emergency Medicine

## 2018-05-19 ENCOUNTER — Emergency Department (HOSPITAL_COMMUNITY): Admission: EM | Admit: 2018-05-19 | Payer: Medicaid Other | Source: Home / Self Care

## 2018-05-19 ENCOUNTER — Encounter (HOSPITAL_COMMUNITY): Payer: Self-pay | Admitting: Emergency Medicine

## 2018-05-19 ENCOUNTER — Emergency Department (HOSPITAL_COMMUNITY): Payer: Medicaid Other

## 2018-05-19 DIAGNOSIS — Z87891 Personal history of nicotine dependence: Secondary | ICD-10-CM | POA: Insufficient documentation

## 2018-05-19 DIAGNOSIS — R569 Unspecified convulsions: Secondary | ICD-10-CM | POA: Insufficient documentation

## 2018-05-19 DIAGNOSIS — E039 Hypothyroidism, unspecified: Secondary | ICD-10-CM | POA: Insufficient documentation

## 2018-05-19 DIAGNOSIS — Z79899 Other long term (current) drug therapy: Secondary | ICD-10-CM | POA: Insufficient documentation

## 2018-05-19 DIAGNOSIS — F1010 Alcohol abuse, uncomplicated: Secondary | ICD-10-CM | POA: Insufficient documentation

## 2018-05-19 DIAGNOSIS — I10 Essential (primary) hypertension: Secondary | ICD-10-CM | POA: Insufficient documentation

## 2018-05-19 DIAGNOSIS — R05 Cough: Secondary | ICD-10-CM | POA: Insufficient documentation

## 2018-05-19 DIAGNOSIS — E876 Hypokalemia: Secondary | ICD-10-CM | POA: Diagnosis not present

## 2018-05-19 DIAGNOSIS — R062 Wheezing: Secondary | ICD-10-CM | POA: Insufficient documentation

## 2018-05-19 LAB — COMPREHENSIVE METABOLIC PANEL
ALT: 24 U/L (ref 0–44)
AST: 47 U/L — AB (ref 15–41)
Albumin: 3.4 g/dL — ABNORMAL LOW (ref 3.5–5.0)
Alkaline Phosphatase: 195 U/L — ABNORMAL HIGH (ref 38–126)
Anion gap: 13 (ref 5–15)
BUN: 6 mg/dL (ref 6–20)
CO2: 24 mmol/L (ref 22–32)
Calcium: 9.3 mg/dL (ref 8.9–10.3)
Chloride: 101 mmol/L (ref 98–111)
Creatinine, Ser: 0.76 mg/dL (ref 0.44–1.00)
GFR calc Af Amer: >60 mL/min (ref >60)
GFR calc non Af Amer: >60 mL/min (ref >60)
Glucose, Bld: 87 mg/dL (ref 70–99)
Potassium: 2.8 mmol/L — ABNORMAL LOW (ref 3.5–5.1)
Sodium: 138 mmol/L (ref 135–145)
Total Bilirubin: 1.3 mg/dL — ABNORMAL HIGH (ref 0.3–1.2)
Total Protein: 7.2 g/dL (ref 6.5–8.1)

## 2018-05-19 LAB — URINALYSIS, ROUTINE W REFLEX MICROSCOPIC
Bacteria, UA: NONE SEEN
Bilirubin Urine: NEGATIVE
GLUCOSE, UA: NEGATIVE mg/dL
Hgb urine dipstick: NEGATIVE
Ketones, ur: 5 mg/dL — AB
Leukocytes, UA: NEGATIVE
Nitrite: NEGATIVE
PROTEIN: 30 mg/dL — AB
Specific Gravity, Urine: 1.005 (ref 1.005–1.030)
pH: 8 (ref 5.0–8.0)

## 2018-05-19 LAB — RAPID URINE DRUG SCREEN, HOSP PERFORMED
Amphetamines: NOT DETECTED
Barbiturates: NOT DETECTED
Benzodiazepines: POSITIVE — AB
Cocaine: NOT DETECTED
Opiates: NOT DETECTED
Tetrahydrocannabinol: POSITIVE — AB

## 2018-05-19 LAB — CBC WITH DIFFERENTIAL/PLATELET
Abs Immature Granulocytes: 0.01 10*3/uL (ref 0.00–0.07)
Basophils Absolute: 0.1 10*3/uL (ref 0.0–0.1)
Basophils Relative: 1 %
Eosinophils Absolute: 0 10*3/uL (ref 0.0–0.5)
Eosinophils Relative: 0 %
HEMATOCRIT: 43.3 % (ref 36.0–46.0)
Hemoglobin: 14.2 g/dL (ref 12.0–15.0)
Immature Granulocytes: 0 %
Lymphocytes Relative: 7 %
Lymphs Abs: 0.4 10*3/uL — ABNORMAL LOW (ref 0.7–4.0)
MCH: 30.9 pg (ref 26.0–34.0)
MCHC: 32.8 g/dL (ref 30.0–36.0)
MCV: 94.1 fL (ref 80.0–100.0)
Monocytes Absolute: 0.4 10*3/uL (ref 0.1–1.0)
Monocytes Relative: 7 %
Neutro Abs: 4.7 10*3/uL (ref 1.7–7.7)
Neutrophils Relative %: 85 %
Platelets: 232 10*3/uL (ref 150–400)
RBC: 4.6 MIL/uL (ref 3.87–5.11)
RDW: 14.1 % (ref 11.5–15.5)
WBC: 5.6 10*3/uL (ref 4.0–10.5)
nRBC: 0 % (ref 0.0–0.2)

## 2018-05-19 LAB — PROTIME-INR
INR: 0.89
Prothrombin Time: 12 seconds (ref 11.4–15.2)

## 2018-05-19 LAB — LIPASE, BLOOD: Lipase: 36 U/L (ref 11–51)

## 2018-05-19 LAB — HCG, QUANTITATIVE, PREGNANCY: hCG, Beta Chain, Quant, S: 3 m[IU]/mL (ref <5)

## 2018-05-19 LAB — ETHANOL

## 2018-05-19 LAB — MAGNESIUM: MAGNESIUM: 1.8 mg/dL (ref 1.7–2.4)

## 2018-05-19 MED ORDER — DILTIAZEM HCL ER COATED BEADS 240 MG PO CP24
240.0000 mg | ORAL_CAPSULE | Freq: Once | ORAL | Status: AC
  Administered 2018-05-19: 240 mg via ORAL
  Filled 2018-05-19: qty 1

## 2018-05-19 MED ORDER — SODIUM CHLORIDE 0.9 % IV BOLUS (SEPSIS)
1000.0000 mL | Freq: Once | INTRAVENOUS | Status: AC
  Administered 2018-05-19: 1000 mL via INTRAVENOUS

## 2018-05-19 MED ORDER — ONDANSETRON 4 MG PO TBDP: 4.0000 mg | ORAL_TABLET | Freq: Four times a day (QID) | ORAL | 0 refills | Status: DC | PRN

## 2018-05-19 MED ORDER — LEVETIRACETAM IN NACL 1000 MG/100ML IV SOLN
1000.0000 mg | Freq: Once | INTRAVENOUS | Status: AC
  Administered 2018-05-19: 1000 mg via INTRAVENOUS
  Filled 2018-05-19: qty 100

## 2018-05-19 MED ORDER — ONDANSETRON HCL 4 MG/2ML IJ SOLN
4.0000 mg | Freq: Once | INTRAMUSCULAR | Status: AC
  Administered 2018-05-19: 4 mg via INTRAVENOUS
  Filled 2018-05-19: qty 2

## 2018-05-19 MED ORDER — POTASSIUM CHLORIDE CRYS ER 20 MEQ PO TBCR: 20.0000 meq | EXTENDED_RELEASE_TABLET | Freq: Two times a day (BID) | ORAL | 0 refills | Status: DC

## 2018-05-19 MED ORDER — POTASSIUM CHLORIDE CRYS ER 20 MEQ PO TBCR
40.0000 meq | EXTENDED_RELEASE_TABLET | Freq: Once | ORAL | Status: AC
  Administered 2018-05-19: 40 meq via ORAL
  Filled 2018-05-19: qty 2

## 2018-05-19 MED ORDER — KETOROLAC TROMETHAMINE 30 MG/ML IJ SOLN
30.0000 mg | Freq: Once | INTRAMUSCULAR | Status: AC
  Administered 2018-05-19: 30 mg via INTRAVENOUS
  Filled 2018-05-19: qty 1

## 2018-05-19 MED ORDER — THIAMINE HCL 100 MG/ML IJ SOLN
100.0000 mg | Freq: Once | INTRAMUSCULAR | Status: AC
  Administered 2018-05-19: 100 mg via INTRAVENOUS
  Filled 2018-05-19: qty 2

## 2018-05-19 MED ORDER — IPRATROPIUM-ALBUTEROL 0.5-2.5 (3) MG/3ML IN SOLN
3.0000 mL | Freq: Once | RESPIRATORY_TRACT | Status: AC
  Administered 2018-05-19: 3 mL via RESPIRATORY_TRACT
  Filled 2018-05-19: qty 3

## 2018-05-19 MED ORDER — HYDRALAZINE HCL 20 MG/ML IJ SOLN: 5.0000 mg | Freq: Once | INTRAMUSCULAR | Status: DC

## 2018-05-19 NOTE — ED Notes (Signed)
Pt ambulated to the bathroom w/ on problems.

## 2018-05-19 NOTE — ED Provider Notes (Signed)
TIME SEEN: 12:38 AM  CHIEF COMPLAINT: Seizure  HPI: Patient is a 43 year old female with history of hypertension, laryngeal cancer, alcohol abuse, seizures who presents to the emergency department with a seizure.  Reportedly family witnessed a seizure in bed at home.  Blood glucose was normal with EMS.  She was hypertensive.  Patient awake and alert currently.  She tells me that she has been vomiting for several days and has not been able to take her Keppra.  Unclear when she last drank any alcohol.  Denies any pain currently.  Does have a productive cough that she has had for weeks.  No fever.  No diarrhea.  No numbness or focal weakness.   ROS: See HPI Constitutional: no fever  Eyes: no drainage  ENT: no runny nose   Cardiovascular:  no chest pain  Resp: no SOB  GI: no vomiting GU: no dysuria Integumentary: no rash  Allergy: no hives  Musculoskeletal: no leg swelling  Neurological: no slurred speech ROS otherwise negative  PAST MEDICAL HISTORY/PAST SURGICAL HISTORY:  Past Medical History:  Diagnosis Date  . Anemia, unspecified 06/17/2013  . Headache   . History of laryngeal cancer 02/03/2013  . Hypertension   . Hypothyroidism (acquired) 06/17/2013  . Multiple lung nodules 06/28/2014  . Pneumonia 01/14/2014, 04/2017  . Poor oral hygiene 06/17/2013  . Rib pain 01/14/2014  . Rib pain on left side 01/05/2014  . Throat cancer [facility])     MEDICATIONS:  Prior to Admission medications   Medication Sig Start Date End Date Taking? Authorizing Provider  albuterol (PROVENTIL HFA;VENTOLIN HFA) 108 (90 Base) MCG/ACT inhaler Inhale 2 puffs into the lungs every 2 (two) hours as needed for wheezing or shortness of breath (cough). 02/20/17   Ripley Fraise, MD  clonazePAM (KLONOPIN) 1 MG tablet Take 1 mg by mouth daily as needed for anxiety.  12/15/15   [provider]  diltiazem (CARDIZEM CD) 240 MG 24 hr capsule Take 240 mg by mouth daily. 11/21/17   [provider]  fluticasone  (FLONASE) 50 MCG/ACT nasal spray Place 2 sprays into both nostrils daily. Patient not taking: Reported on 01/17/2018 06/06/17   Eugenie Filler, MD  folic acid (FOLVITE) 1 MG tablet Take 1 tablet (1 mg total) by mouth daily. 06/06/17   Eugenie Filler, MD  hydrOXYzine (VISTARIL) 50 MG capsule Take 50 mg by mouth at bedtime. 11/21/17   [provider]  levETIRAcetam (KEPPRA) 500 MG tablet Take 1 tablet (500 mg total) by mouth 2 (two) times daily. 05/04/18   Ghimire, Henreitta Leber, MD  levothyroxine (SYNTHROID, LEVOTHROID) 50 MCG tablet Take 1.5 tablets (75 mcg total) by mouth daily before breakfast. 05/04/18   Ghimire, Henreitta Leber, MD  Multiple Vitamin (MULTIVITAMIN WITH MINERALS) TABS tablet Take 1 tablet by mouth daily. Patient not taking: Reported on 01/17/2018 06/06/17   Eugenie Filler, MD  pantoprazole (PROTONIX) 40 MG tablet Take 1 tablet (40 mg total) by mouth daily. 01/19/18   Jean Rosenthal, MD  polyethylene glycol (MIRALAX / GLYCOLAX) packet Take 17 g by mouth 2 (two) times daily. Patient taking differently: Take 17 g by mouth daily as needed for mild constipation.  06/05/17   Eugenie Filler, MD  potassium chloride SA (K-DUR,KLOR-CON) 20 MEQ tablet Take 10-20 mEq by mouth See admin instructions. Take 1 tablet (65mEq) by mouth in the morning; take 10 mEq by mouth in the evening 08/29/14   [provider]  senna-docusate (SENOKOT-S) 8.6-50 MG tablet Take 1  tablet by mouth at bedtime. Patient taking differently: Take 1 tablet by mouth at bedtime as needed for mild constipation.  06/05/17   Eugenie Filler, MD  thiamine 100 MG tablet Take 1 tablet (100 mg total) by mouth daily. 06/06/17   Eugenie Filler, MD  topiramate (TOPAMAX) 50 MG tablet Take 100 mg by mouth 2 (two) times daily.     [provider]  traZODone (DESYREL) 100 MG tablet Take 100 mg by mouth at bedtime. 08/15/15   [provider]    ALLERGIES:  No Known Allergies  SOCIAL HISTORY:  Social  History   Tobacco Use  . Smoking status: Former Smoker    Packs/day: 1.00    Years: 15.00    Pack years: 15.00    Last attempt to quit: 09/11/2010    Years since quitting: 7.6  . Smokeless tobacco: Never Used  Substance Use Topics  . Alcohol use: Yes    Alcohol/week: 2.0 standard drinks    Types: 2 Cans of beer per week    Comment: rare    FAMILY HISTORY: Family History  Problem Relation Age of Onset  . Cancer Maternal Grandmother        skin cancer    EXAM: BP (!) 174/118 (BP Location: Right Arm)   Pulse 88   Temp 98.6 F (37 C) (Oral)   Resp 18   Ht 5\' 1"  (1.549 m)   LMP 10/20/2012 Comment: pt. has had tubal ligation,no longer has peroids  SpO2 98%   BMI 16.08 kg/m  CONSTITUTIONAL: Alert and oriented x 3 and responds appropriately to questions.  Appears malnourished, thin, appears much older than stated age, chronically ill-appearing HEAD: Normocephalic; atraumatic EYES: Conjunctivae clear, PERRL, EOMI ENT: normal nose; no rhinorrhea; moist mucous membranes; pharynx without lesions noted; no dental injury; no septal hematoma NECK: Supple, no meningismus, no LAD; no midline spinal tenderness, step-off or deformity; trachea midline CARD: RRR; S1 and S2 appreciated; no murmurs, no clicks, no rubs, no gallops RESP: Normal chest excursion without splinting or tachypnea; sounds equal bilaterally, scattered expiratory wheezes and rhonchi, no rales; no hypoxia or respiratory distress CHEST:  chest wall stable, no crepitus or ecchymosis or deformity, nontender to palpation; no flail chest ABD/GI: Normal bowel sounds; non-distended; soft, mildly tender to palpation diffusely, no rebound, no guarding; no ecchymosis or other lesions noted PELVIS:  stable, nontender to palpation BACK:  The back appears normal and is non-tender to palpation, there is no CVA tenderness; no midline spinal tenderness, step-off or deformity EXT: Normal ROM in all joints; non-tender to palpation; no edema;  normal capillary refill; no cyanosis, no bony tenderness or bony deformity of patient's extremities, no joint effusion, compartments are soft, extremities are warm and well-perfused, no ecchymosis SKIN: Normal color for age and race; warm NEURO: Moves all extremities equally, reports normal sensation diffusely, cranial nerves II through XII intact, normal speech PSYCH: The patient's mood and manner are appropriate. Grooming and personal hygiene are appropriate.  MEDICAL DECISION MAKING: Patient here after witnessed seizure.  Has history of alcohol withdrawal seizures.  Is hypertensive here.  Has no complaints currently.  Is tender throughout her abdomen with palpation but states when I am not palpating her abdomen does not hurt.  She states she has been vomiting has not been taking her seizure medications.  She is wheezing on exam.  Will give DuoNeb and obtain chest x-ray.  Will obtain labs, urine.  Will give IV Keppra.  Will monitor patient closely  in the ED.  ED PROGRESS: Patient's labs unremarkable other than potassium level of 2.8.  Will give oral replacement.  Patient is resting comfortably without tachycardia, delirium, tremors, vomiting.  Her alcohol level is negative here but she does not appear to be in withdrawal.  Magnesium level normal at 1.8.  Urine shows no sign of infection.  Drug screen positive for benzodiazepines and marijuana.  Again no obvious sign of withdrawal on examination.  She is able to ambulate, eat and drink here without difficulty.  Her blood pressure is very elevated here.  It appears she is on diltiazem at home but no other medications for blood pressure.  We will give her home dose of medications here given she is able to tolerate oral fluids.  We will recheck a manual blood pressure because she frequently is very hypertensive or hypotensive based on record review.   Patient's repeat blood pressure is 160/100 on manual recheck.  Given she is asymptomatic, will just give  her her home dose of medications.  She has not vomited here.  We will have her follow-up closely with her primary care physician for this.  She is requesting something for pain as she states she is having "pain all over".  Given her history of alcohol abuse, will avoid narcotics.  She reports Tylenol will not help her pain.  Will give dose of IV Toradol.   At this time, I do not feel there is any life-threatening condition present. I have reviewed and discussed all results (EKG, imaging, lab, urine as appropriate) and exam findings with patient/family. I have reviewed nursing notes and appropriate previous records.  I feel the patient is safe to be discharged home without further emergent workup and can continue workup as an outpatient as needed. Discussed usual and customary return precautions. Patient/family verbalize understanding and are comfortable with this plan.  Outpatient follow-up has been provided as needed. All questions have been answered.    EKG Interpretation  Date/Time:  Tuesday May 19 2018 03:57:53 EST Ventricular Rate:  71 PR Interval:    QRS Duration: 78 QT Interval:  407 QTC Calculation: 443 R Axis:   79 Text Interpretation:  Sinus rhythm Borderline prolonged PR interval Probable left atrial enlargement Anterior infarct, old Baseline wander in lead(s) I II aVR No significant change since last tracing Confirmed by , Cyril Mourning [phone]) on 05/19/2018 4:10:48 AM         , Delice Bison, DO 05/19/18 [id number]

## 2018-05-19 NOTE — Discharge Instructions (Signed)
Your potassium level today was 2.8.  I recommend that you increase your potassium tablets to 20 mEq twice daily and follow-up closely with your primary care physician in 1 week to have this rechecked.  Your blood pressure was also very elevated today.  Please continue your diltiazem as prescribed and follow-up with your primary care physician to have your blood pressure rechecked.  Also recommend that you continue taking your Keppra as prescribed to prevent seizures.  You should not drive a car (for at least 6 months or until you are cleared by a neurologist), operate machinery or perform any activity that may be dangerous to yourself or others if you were to have another seizure.  I recommend that you stop drinking alcohol.  Please continue your multivitamin and thiamine daily.

## 2018-05-19 NOTE — ED Triage Notes (Signed)
Per EMS, pt had family witnessed seizures as she was laying in bed.  Pt was postdictal, Hx of cancer and seizures.  Pt appears somewhat oriented in ed.

## 2018-05-20 ENCOUNTER — Other Ambulatory Visit: Payer: Self-pay | Admitting: Internal Medicine

## 2018-05-20 DIAGNOSIS — Z8521 Personal history of malignant neoplasm of larynx: Secondary | ICD-10-CM

## 2018-05-20 DIAGNOSIS — Z923 Personal history of irradiation: Secondary | ICD-10-CM

## 2018-05-20 DIAGNOSIS — R1310 Dysphagia, unspecified: Secondary | ICD-10-CM

## 2018-05-20 DIAGNOSIS — E039 Hypothyroidism, unspecified: Secondary | ICD-10-CM

## 2018-05-26 ENCOUNTER — Other Ambulatory Visit: Payer: Medicaid Other

## 2018-06-17 ENCOUNTER — Ambulatory Visit: Payer: Medicaid Other | Admitting: Neurology

## 2018-08-15 ENCOUNTER — Other Ambulatory Visit: Payer: Self-pay | Admitting: Internal Medicine

## 2018-09-11 ENCOUNTER — Emergency Department (HOSPITAL_COMMUNITY)
Admission: EM | Admit: 2018-09-11 | Discharge: 2018-09-11 | Disposition: A | Discharge status: Home / Self Care | Payer: Medicaid Other | Attending: Emergency Medicine | Admitting: Emergency Medicine

## 2018-09-11 ENCOUNTER — Emergency Department (HOSPITAL_COMMUNITY): Payer: Medicaid Other

## 2018-09-11 ENCOUNTER — Other Ambulatory Visit: Payer: Self-pay

## 2018-09-11 DIAGNOSIS — Z87891 Personal history of nicotine dependence: Secondary | ICD-10-CM | POA: Diagnosis not present

## 2018-09-11 DIAGNOSIS — B353 Tinea pedis: Secondary | ICD-10-CM | POA: Diagnosis not present

## 2018-09-11 DIAGNOSIS — E876 Hypokalemia: Secondary | ICD-10-CM | POA: Diagnosis not present

## 2018-09-11 DIAGNOSIS — R51 Headache: Secondary | ICD-10-CM | POA: Diagnosis not present

## 2018-09-11 DIAGNOSIS — Z79899 Other long term (current) drug therapy: Secondary | ICD-10-CM | POA: Diagnosis not present

## 2018-09-11 DIAGNOSIS — I10 Essential (primary) hypertension: Secondary | ICD-10-CM | POA: Diagnosis not present

## 2018-09-11 DIAGNOSIS — E039 Hypothyroidism, unspecified: Secondary | ICD-10-CM | POA: Insufficient documentation

## 2018-09-11 DIAGNOSIS — R569 Unspecified convulsions: Secondary | ICD-10-CM

## 2018-09-11 LAB — COMPREHENSIVE METABOLIC PANEL
ALT: 27 U/L (ref 0–44)
AST: 52 U/L — ABNORMAL HIGH (ref 15–41)
Albumin: 4 g/dL (ref 3.5–5.0)
Alkaline Phosphatase: 150 U/L — ABNORMAL HIGH (ref 38–126)
Anion gap: 14 (ref 5–15)
BUN: 6 mg/dL (ref 6–20)
CO2: 26 mmol/L (ref 22–32)
Calcium: 10.1 mg/dL (ref 8.9–10.3)
Chloride: 96 mmol/L — ABNORMAL LOW (ref 98–111)
Creatinine, Ser: 0.82 mg/dL (ref 0.44–1.00)
GFR calc Af Amer: >60 mL/min (ref >60)
GFR calc non Af Amer: >60 mL/min (ref >60)
Glucose, Bld: 88 mg/dL (ref 70–99)
Potassium: 2.8 mmol/L — ABNORMAL LOW (ref 3.5–5.1)
Sodium: 136 mmol/L (ref 135–145)
Total Bilirubin: 0.8 mg/dL (ref 0.3–1.2)
Total Protein: 7 g/dL (ref 6.5–8.1)

## 2018-09-11 LAB — URINALYSIS, ROUTINE W REFLEX MICROSCOPIC
Bacteria, UA: NONE SEEN
Bilirubin Urine: NEGATIVE
Glucose, UA: NEGATIVE mg/dL
Ketones, ur: NEGATIVE mg/dL
Leukocytes,Ua: NEGATIVE
Nitrite: NEGATIVE
Protein, ur: NEGATIVE mg/dL
Specific Gravity, Urine: 1.005 (ref 1.005–1.030)
pH: 7 (ref 5.0–8.0)

## 2018-09-11 LAB — CBC WITH DIFFERENTIAL/PLATELET
Abs Immature Granulocytes: 0.01 10*3/uL (ref 0.00–0.07)
Basophils Absolute: 0.1 10*3/uL (ref 0.0–0.1)
Basophils Relative: 1 %
Eosinophils Absolute: 0 10*3/uL (ref 0.0–0.5)
Eosinophils Relative: 1 %
HCT: 42 % (ref 36.0–46.0)
Hemoglobin: 13.9 g/dL (ref 12.0–15.0)
Immature Granulocytes: 0 %
Lymphocytes Relative: 9 %
Lymphs Abs: 0.5 10*3/uL — ABNORMAL LOW (ref 0.7–4.0)
MCH: 30.2 pg (ref 26.0–34.0)
MCHC: 33.1 g/dL (ref 30.0–36.0)
MCV: 91.1 fL (ref 80.0–100.0)
Monocytes Absolute: 0.5 10*3/uL (ref 0.1–1.0)
Monocytes Relative: 9 %
Neutro Abs: 4.3 10*3/uL (ref 1.7–7.7)
Neutrophils Relative %: 80 %
Platelets: 218 10*3/uL (ref 150–400)
RBC: 4.61 MIL/uL (ref 3.87–5.11)
RDW: 16 % — ABNORMAL HIGH (ref 11.5–15.5)
WBC: 5.3 10*3/uL (ref 4.0–10.5)
nRBC: 0 % (ref 0.0–0.2)

## 2018-09-11 LAB — RAPID URINE DRUG SCREEN, HOSP PERFORMED
Amphetamines: NOT DETECTED
Barbiturates: NOT DETECTED
Benzodiazepines: NOT DETECTED
Cocaine: NOT DETECTED
Opiates: NOT DETECTED
Tetrahydrocannabinol: POSITIVE — AB

## 2018-09-11 LAB — ETHANOL: Alcohol, Ethyl (B): <10 mg/dL (ref <10)

## 2018-09-11 LAB — MAGNESIUM: Magnesium: 1.7 mg/dL (ref 1.7–2.4)

## 2018-09-11 MED ORDER — LEVETIRACETAM IN NACL 500 MG/100ML IV SOLN
500.0000 mg | Freq: Once | INTRAVENOUS | Status: AC
  Administered 2018-09-11: 05:00:00 500 mg via INTRAVENOUS
  Filled 2018-09-11: qty 100

## 2018-09-11 MED ORDER — CLOTRIMAZOLE 1 % EX CREA: TOPICAL_CREAM | CUTANEOUS | 0 refills | Status: DC

## 2018-09-11 MED ORDER — POTASSIUM CHLORIDE ER 20 MEQ PO TBCR: 20.0000 meq | EXTENDED_RELEASE_TABLET | Freq: Two times a day (BID) | ORAL | 0 refills | Status: DC

## 2018-09-11 MED ORDER — LEVETIRACETAM 500 MG PO TABS: 500.0000 mg | ORAL_TABLET | Freq: Two times a day (BID) | ORAL | 1 refills | Status: DC

## 2018-09-11 MED ORDER — SODIUM CHLORIDE 0.9 % IV BOLUS (SEPSIS)
1000.0000 mL | Freq: Once | INTRAVENOUS | Status: AC
  Administered 2018-09-11: 1000 mL via INTRAVENOUS

## 2018-09-11 MED ORDER — POTASSIUM CHLORIDE CRYS ER 20 MEQ PO TBCR
40.0000 meq | EXTENDED_RELEASE_TABLET | Freq: Once | ORAL | Status: AC
  Administered 2018-09-11: 40 meq via ORAL
  Filled 2018-09-11: qty 2

## 2018-09-11 MED ORDER — DIPHENHYDRAMINE HCL 50 MG/ML IJ SOLN
25.0000 mg | Freq: Once | INTRAMUSCULAR | Status: AC
  Administered 2018-09-11: 25 mg via INTRAVENOUS
  Filled 2018-09-11: qty 1

## 2018-09-11 MED ORDER — THIAMINE HCL 100 MG/ML IJ SOLN
100.0000 mg | Freq: Once | INTRAMUSCULAR | Status: AC
  Administered 2018-09-11: 100 mg via INTRAVENOUS
  Filled 2018-09-11: qty 2

## 2018-09-11 MED ORDER — METOCLOPRAMIDE HCL 5 MG/ML IJ SOLN
10.0000 mg | Freq: Once | INTRAMUSCULAR | Status: AC
  Administered 2018-09-11: 04:00:00 10 mg via INTRAVENOUS
  Filled 2018-09-11: qty 2

## 2018-09-11 NOTE — ED Provider Notes (Signed)
TIME SEEN: 3:45 AM  CHIEF COMPLAINT: Seizure  HPI: Patient is a 43 year old female with history of hypertension, seizures on Keppra, migraine headaches, hypothyroidism, alcohol abuse who presents to the emergency department after she had a seizure.  I spoke to patient's daughter, Keane Scrape, who states patient had a tonic-clonic seizure that lasted several minutes.  Postictal afterwards for 10 to 15 minutes.  No tongue biting or incontinence.  She states that this occurred when patient was lying in the bed.  Patient states that this is typical of her seizures.  Reports she has missed her Keppra for the past couple of days.  Patient denies fevers, chills, cough, chest pain, shortness of breath.  States she has had vomiting.  Reports she had one beer to drink today.  States she does not drink every day.  No diarrhea.  Complaining of a diffuse throbbing headache that feels similar to her previous migraines.  No numbness, tingling or weakness.  Patient also has pruritic lesion to the top of her foot that has been present for several weeks.  It will have clear, yellow drainage at times.  She has been using over-the-counter medications without relief.  ROS: See HPI Constitutional: no fever  Eyes: no drainage  ENT: no runny nose   Cardiovascular:  no chest pain  Resp: no SOB  GI: no vomiting GU: no dysuria Integumentary: no rash  Allergy: no hives  Musculoskeletal: no leg swelling  Neurological: no slurred speech ROS otherwise negative  PAST MEDICAL HISTORY/PAST SURGICAL HISTORY:  Past Medical History:  Diagnosis Date  . Anemia, unspecified 06/17/2013  . Headache   . History of laryngeal cancer 02/03/2013  . Hypertension   . Hypothyroidism (acquired) 06/17/2013  . Multiple lung nodules 06/28/2014  . Pneumonia 01/14/2014, 04/2017  . Poor oral hygiene 06/17/2013  . Rib pain 01/14/2014  . Rib pain on left side 01/05/2014  . 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 Authorizing Provider  albuterol (PROVENTIL HFA;VENTOLIN HFA) 108 (90 Base) MCG/ACT inhaler Inhale 2 puffs into the lungs every 2 (two) hours as needed for wheezing or shortness of breath (cough). 02/20/17   Ripley Fraise, MD  clonazePAM (KLONOPIN) 1 MG tablet Take 1 mg by mouth daily as needed for anxiety.  12/15/15   [provider]  diltiazem (CARDIZEM CD) 240 MG 24 hr capsule Take 240 mg by mouth daily. 11/21/17   [provider]  folic acid (FOLVITE) 1 MG tablet Take 1 tablet (1 mg total) by mouth daily. 06/06/17   Eugenie Filler, MD  hydrOXYzine (VISTARIL) 50 MG capsule Take 50 mg by mouth at bedtime. 11/21/17   [provider]  levETIRAcetam (KEPPRA) 500 MG tablet Take 1 tablet (500 mg total) by mouth 2 (two) times daily. 05/04/18   Ghimire, Henreitta Leber, MD  levothyroxine (SYNTHROID, LEVOTHROID) 50 MCG tablet Take 1.5 tablets (75 mcg total) by mouth daily before breakfast. 05/04/18   Ghimire, Henreitta Leber, MD  Multiple Vitamin (MULTIVITAMIN WITH MINERALS) TABS tablet Take 1 tablet by mouth daily. Patient not taking: Reported on 01/17/2018 06/06/17   Eugenie Filler, MD  ondansetron (ZOFRAN ODT) 4 MG disintegrating tablet Take 1 tablet (4 mg total) by mouth every 6 (six) hours as needed. 05/19/18   Eadie Repetto, Delice Bison, DO  pantoprazole (PROTONIX) 40 MG tablet Take 1 tablet (40 mg total) by mouth daily. 01/19/18   Jean Rosenthal, MD  polyethylene  glycol (MIRALAX / GLYCOLAX) packet Take 17 g by mouth 2 (two) times daily. Patient taking differently: Take 17 g by mouth daily as needed for mild constipation.  06/05/17   Eugenie Filler, MD  potassium chloride SA (K-DUR,KLOR-CON) 20 MEQ tablet Take 1 tablet (20 mEq total) by mouth 2 (two) times daily. 05/19/18   Malasia Torain, Delice Bison, DO  senna-docusate (SENOKOT-S) 8.6-50 MG tablet Take 1 tablet by mouth at bedtime. Patient taking differently: Take 1 tablet by mouth at bedtime as needed for mild constipation.   06/05/17   Eugenie Filler, MD  thiamine 100 MG tablet Take 1 tablet (100 mg total) by mouth daily. 06/06/17   Eugenie Filler, MD  topiramate (TOPAMAX) 50 MG tablet Take 100 mg by mouth 2 (two) times daily.     [provider]  traZODone (DESYREL) 100 MG tablet Take 100 mg by mouth at bedtime. 08/15/15   [provider]    ALLERGIES:  No Known Allergies  SOCIAL HISTORY:  Social History   Tobacco Use  . Smoking status: Former Smoker    Packs/day: 1.00    Years: 15.00    Pack years: 15.00    Last attempt to quit: 09/11/2010    Years since quitting: 8.0  . Smokeless tobacco: Never Used  Substance Use Topics  . Alcohol use: Yes    Alcohol/week: 2.0 standard drinks    Types: 2 Cans of beer per week    Comment: rare    FAMILY HISTORY: Family History  Problem Relation Age of Onset  . Cancer Maternal Grandmother        skin cancer    EXAM: BP (!) 178/100 (BP Location: Right Arm)   Pulse 78   Resp 14   Ht 5\' 1"  (1.549 m)   Wt 38.6 kg   LMP 10/20/2012 Comment: pt. has had tubal ligation,no longer has peroids  SpO2 100%   BMI 16.06 kg/m  CONSTITUTIONAL: Alert and oriented and responds appropriately to questions.  Thin, chronically ill-appearing; GCS 15 HEAD: Normocephalic; atraumatic EYES: Conjunctivae clear, PERRL, EOMI ENT: normal nose; no rhinorrhea; moist mucous membranes; pharynx without lesions noted; no dental injury; no septal hematoma NECK: Supple, no meningismus, no LAD; no midline spinal tenderness, step-off or deformity; trachea midline CARD: RRR; S1 and S2 appreciated; no murmurs, no clicks, no rubs, no gallops RESP: Normal chest excursion without splinting or tachypnea; breath sounds clear and equal bilaterally; no wheezes, no rhonchi, no rales; no hypoxia or respiratory distress CHEST:  chest wall stable, no crepitus or ecchymosis or deformity, nontender to palpation; no flail chest ABD/GI: Normal bowel sounds; non-distended; soft,  non-tender, no rebound, no guarding; no ecchymosis or other lesions noted PELVIS:  stable, nontender to palpation BACK:  The back appears normal and is non-tender to palpation, there is no CVA tenderness; no midline spinal tenderness, step-off or deformity EXT: Normal ROM in all joints; non-tender to palpation; no edema; normal capillary refill; no cyanosis, no bony tenderness or bony deformity of patient's extremities, no joint effusion, compartments are soft, extremities are warm and well-perfused, no ecchymosis SKIN: Normal color for age and race; warm, slightly erythematous crusted lesion to the top of the right foot consistent with fungal infection without superimposed bacterial infection, no purulent drainage, no warmth, no induration or fluctuance, 2+ right DP pulse NEURO: Moves all extremities equally, sensation to light touch intact diffusely, cranial nerves II through XII intact, normal speech PSYCH: The patient's mood and manner are appropriate. Grooming and  personal hygiene are appropriate.  MEDICAL DECISION MAKING: Patient here after she had a seizure.  Reports medical noncompliance for the past couple of days.  Has history of alcohol abuse but denies alcohol withdrawal seizure and states she does not drink every day.  Does not appear intoxicated currently.  Will load with IV Keppra.  Will check labs, urine.  She is complaining of a headache and is extremely hypertensive here.  Will check manual blood pressures and obtain a CT of her head.  Will give Reglan for nausea and headache.  ED PROGRESS: Patient's labs unremarkable other potassium level of 2.8 without EKG changes.  Will give oral replacement.  Her blood pressure continues to fluctuate but has improved without intervention.  It appears that she has had wildly fluctuating blood pressures in the past and states because of this her doctor took her off of her antihypertensives.  She is not having any current headache after getting a  migraine cocktail.  No blurry vision, chest pain or shortness of breath, neuro deficits.  I have recommended that she have her PCP follow her closely for her blood pressure.  Will discharge with description of potassium and have this rechecked next week.  Magnesium level normal.  Labs otherwise unremarkable.  No hyponatremia, hypoglycemia.  CT of the head shows no acute abnormality.  No further seizure-like activity she continues to be neurologically intact without complaints.  Able to tolerate p.o. here in the ED.  Urine positive for THC but otherwise unremarkable UDS.  Urinalysis shows no infection.  Patient comfortable with plan for discharge home with her daughter.  Will refill her Keppra.  We will also discharge with prescription of clotrimazole for her tinea pedis on the top of the right foot.  No signs of superimposed bacterial infection.   At this time, I do not feel there is any life-threatening condition present. I have reviewed and discussed all results (EKG, imaging, lab, urine as appropriate) and exam findings with patient/family. I have reviewed nursing notes and appropriate previous records.  I feel the patient is safe to be discharged home without further emergent workup and can continue workup as an outpatient as needed. Discussed usual and customary return precautions. Patient/family verbalize understanding and are comfortable with this plan.  Outpatient follow-up has been provided as needed. All questions have been answered.     EKG Interpretation  Date/Time:  Friday Sep 11 2018 03:19:17 EDT Ventricular Rate:  96 PR Interval:    QRS Duration: 86 QT Interval:  389 QTC Calculation: 492 R Axis:   78 Text Interpretation:  Sinus rhythm Borderline prolonged PR interval Biatrial enlargement Anterior infarct, age indeterminate No significant change since last tracing Confirmed by Granger Chui, Cyril Mourning [phone]) on 09/11/2018 5:37:48 AM          EKG Interpretation  Date/Time:  Friday Sep 11 2018 03:19:17 EDT Ventricular Rate:  96 PR Interval:    QRS Duration: 86 QT Interval:  389 QTC Calculation: 492 R Axis:   78 Text Interpretation:  Sinus rhythm Borderline prolonged PR interval Biatrial enlargement Anterior infarct, age indeterminate No significant change since last tracing Confirmed by Ascher Schroepfer, Cyril Mourning [phone]) on 09/11/2018 5:37:48 AM         Arantxa Piercey, Delice Bison, DO 09/11/18 [phone]

## 2018-09-11 NOTE — ED Notes (Signed)
Patient verbalizes understanding of discharge instructions. Opportunity for questioning and answers were provided. Armband removed by staff, pt discharged from ED by wheelchair with daughter to pick up   

## 2018-09-11 NOTE — ED Notes (Signed)
Pt given apple sauce and water. Tolerated well

## 2018-09-11 NOTE — ED Triage Notes (Signed)
Pt coming POV after cousin and daughter and stated that pt had seizure while she was in bed asleep. Per pt family stated it lasted approx. 7 minutes. Pt has hx of seizure. Pt unsure if she is on meds for seizures. Pt alert and oriented

## 2018-09-11 NOTE — ED Notes (Signed)
Pt hypertensive upon assessment. Will obtain a manual BP

## 2018-09-11 NOTE — ED Notes (Signed)
Pt stated she forgot to take dose of Keppra yesterday evening, but did take morning dose

## 2018-10-12 ENCOUNTER — Encounter: Payer: Self-pay | Admitting: Hematology and Oncology

## 2018-10-17 ENCOUNTER — Other Ambulatory Visit: Payer: Self-pay | Admitting: Internal Medicine

## 2018-11-23 ENCOUNTER — Encounter: Payer: Self-pay | Admitting: Podiatry

## 2018-11-23 ENCOUNTER — Ambulatory Visit: Payer: Medicaid Other | Admitting: Podiatry

## 2018-11-23 ENCOUNTER — Other Ambulatory Visit: Payer: Self-pay

## 2018-11-23 VITALS — BP 136/87 | HR 78 | Temp 98.6°F

## 2018-11-23 DIAGNOSIS — L309 Dermatitis, unspecified: Secondary | ICD-10-CM

## 2018-11-23 MED ORDER — DOXYCYCLINE HYCLATE 100 MG PO TABS: 100.0000 mg | ORAL_TABLET | Freq: Two times a day (BID) | ORAL | 0 refills | Status: DC

## 2018-11-23 MED ORDER — BETAMETHASONE DIPROPIONATE 0.05 % EX CREA: TOPICAL_CREAM | Freq: Two times a day (BID) | CUTANEOUS | 2 refills | Status: DC

## 2018-11-24 NOTE — Progress Notes (Signed)
   HPI: 43 y.o. female presenting today as a new patient with a chief complaint of an itching rash noted to the dorsal right foot that appeared one month ago. She states the rash started as small red spots that have now worsened. She reports associated clear drainage and some intermittent burning pain. She has not had any treatment for the symptoms. There are no modifying factors noted. Patient is here for further evaluation and treatment.   Past Medical History:  Diagnosis Date  . Anemia, unspecified 06/17/2013  . Headache   . History of laryngeal cancer 02/03/2013  . Hypertension   . Hypothyroidism (acquired) 06/17/2013  . Multiple lung nodules 06/28/2014  . Pneumonia 01/14/2014, 04/2017  . Poor oral hygiene 06/17/2013  . Rib pain 01/14/2014  . Rib pain on left side 01/05/2014  . 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  Dermatology: Erythema noted with demarcated border with associated pruritis. Skin is warm, dry and supple bilateral lower extremities.   Vascular: Palpable pedal pulses bilaterally. No edema noted. Capillary refill within normal limits.  Neurological: Epicritic and protective threshold grossly intact bilaterally.   Musculoskeletal Exam: Range of motion within normal limits to all pedal and ankle joints bilateral. Muscle strength 5/5 in all groups bilateral.       Assessment: 1. Dermatitis right foot    Plan of Care:  1. Patient evaluated.   2. Prescription for Betamethasone 0.05% cream provided to patient to use twice daily.  3. Prescription for Doxycycline 100 mg #20 provided to patient.  4. Return to clinic in 4 weeks.      Edrick Kins, DPM [facility]  Dr. Edrick Kins, DPM    [address], [place] [address]                Office [phone]  Fax [phone]

## 2018-12-21 ENCOUNTER — Ambulatory Visit: Payer: Medicaid Other | Admitting: Podiatry

## 2019-01-01 ENCOUNTER — Telehealth: Payer: Self-pay | Admitting: Hematology and Oncology

## 2019-01-01 NOTE — Telephone Encounter (Signed)
NG PAL 9/22 lab/fu moved from 9/22 to 9/24. Not able to reach patient or leave message - voicemail full. Schedule mailed.

## 2019-01-25 ENCOUNTER — Other Ambulatory Visit: Payer: Self-pay

## 2019-01-25 MED ORDER — BETAMETHASONE DIPROPIONATE 0.05 % EX CREA: TOPICAL_CREAM | Freq: Two times a day (BID) | CUTANEOUS | 2 refills | Status: DC

## 2019-01-25 NOTE — Progress Notes (Signed)
Called pt to inform of Rx for betamethasone cream sent to pharmacy on file

## 2019-02-02 ENCOUNTER — Other Ambulatory Visit: Payer: Medicaid Other

## 2019-02-02 ENCOUNTER — Ambulatory Visit: Payer: Medicaid Other | Admitting: Hematology and Oncology

## 2019-02-03 ENCOUNTER — Other Ambulatory Visit: Payer: Self-pay | Admitting: Hematology and Oncology

## 2019-02-03 DIAGNOSIS — E039 Hypothyroidism, unspecified: Secondary | ICD-10-CM

## 2019-02-03 DIAGNOSIS — Z8521 Personal history of malignant neoplasm of larynx: Secondary | ICD-10-CM

## 2019-02-04 ENCOUNTER — Inpatient Hospital Stay: Payer: Medicaid Other | Attending: Hematology and Oncology

## 2019-02-04 ENCOUNTER — Inpatient Hospital Stay: Payer: Medicaid Other | Admitting: Hematology and Oncology

## 2019-02-04 ENCOUNTER — Encounter: Payer: Self-pay | Admitting: Hematology and Oncology

## 2019-02-05 ENCOUNTER — Telehealth: Payer: Self-pay | Admitting: Hematology and Oncology

## 2019-02-05 NOTE — Telephone Encounter (Signed)
Returned patient's phone call regarding rescheduling 09/24 appointment, left a voicemail.

## 2019-02-19 ENCOUNTER — Telehealth: Payer: Self-pay | Admitting: *Deleted

## 2019-02-19 NOTE — Telephone Encounter (Signed)
Called and spoke with Patty at the [facility] to get the prior authorization number for the betamethasone dip 0.05% cream 15 gm and the authorization number is [id number] and will be valid in 24 hours and called [facility] on [address] and spoke with Abby to let them know the authorization was approved. Lattie Haw

## 2019-03-13 ENCOUNTER — Inpatient Hospital Stay (HOSPITAL_COMMUNITY)
Admission: EM | Admit: 2019-03-13 | Discharge: 2019-03-15 | DRG: 641 | Disposition: A | Discharge status: Home / Self Care | Payer: Medicaid Other | Attending: Internal Medicine | Admitting: Internal Medicine

## 2019-03-13 ENCOUNTER — Encounter (HOSPITAL_COMMUNITY): Payer: Self-pay

## 2019-03-13 ENCOUNTER — Emergency Department (HOSPITAL_COMMUNITY): Payer: Medicaid Other

## 2019-03-13 ENCOUNTER — Other Ambulatory Visit: Payer: Self-pay

## 2019-03-13 DIAGNOSIS — F1290 Cannabis use, unspecified, uncomplicated: Secondary | ICD-10-CM | POA: Diagnosis present

## 2019-03-13 DIAGNOSIS — Z87891 Personal history of nicotine dependence: Secondary | ICD-10-CM

## 2019-03-13 DIAGNOSIS — Z79890 Hormone replacement therapy (postmenopausal): Secondary | ICD-10-CM

## 2019-03-13 DIAGNOSIS — Z808 Family history of malignant neoplasm of other organs or systems: Secondary | ICD-10-CM

## 2019-03-13 DIAGNOSIS — Z20828 Contact with and (suspected) exposure to other viral communicable diseases: Secondary | ICD-10-CM | POA: Diagnosis present

## 2019-03-13 DIAGNOSIS — R1110 Vomiting, unspecified: Secondary | ICD-10-CM | POA: Diagnosis not present

## 2019-03-13 DIAGNOSIS — R1012 Left upper quadrant pain: Secondary | ICD-10-CM | POA: Diagnosis not present

## 2019-03-13 DIAGNOSIS — R197 Diarrhea, unspecified: Secondary | ICD-10-CM

## 2019-03-13 DIAGNOSIS — E876 Hypokalemia: Secondary | ICD-10-CM | POA: Diagnosis not present

## 2019-03-13 DIAGNOSIS — E878 Other disorders of electrolyte and fluid balance, not elsewhere classified: Secondary | ICD-10-CM | POA: Diagnosis present

## 2019-03-13 DIAGNOSIS — Z8521 Personal history of malignant neoplasm of larynx: Secondary | ICD-10-CM

## 2019-03-13 DIAGNOSIS — Z79899 Other long term (current) drug therapy: Secondary | ICD-10-CM

## 2019-03-13 DIAGNOSIS — A084 Viral intestinal infection, unspecified: Secondary | ICD-10-CM | POA: Diagnosis present

## 2019-03-13 DIAGNOSIS — I10 Essential (primary) hypertension: Secondary | ICD-10-CM | POA: Diagnosis present

## 2019-03-13 DIAGNOSIS — T465X6A Underdosing of other antihypertensive drugs, initial encounter: Secondary | ICD-10-CM | POA: Diagnosis present

## 2019-03-13 DIAGNOSIS — E872 Acidosis: Secondary | ICD-10-CM | POA: Diagnosis present

## 2019-03-13 DIAGNOSIS — C329 Malignant neoplasm of larynx, unspecified: Secondary | ICD-10-CM

## 2019-03-13 DIAGNOSIS — Z9114 Patient's other noncompliance with medication regimen: Secondary | ICD-10-CM

## 2019-03-13 DIAGNOSIS — E039 Hypothyroidism, unspecified: Secondary | ICD-10-CM | POA: Diagnosis present

## 2019-03-13 DIAGNOSIS — G43909 Migraine, unspecified, not intractable, without status migrainosus: Secondary | ICD-10-CM | POA: Diagnosis present

## 2019-03-13 DIAGNOSIS — F17210 Nicotine dependence, cigarettes, uncomplicated: Secondary | ICD-10-CM

## 2019-03-13 DIAGNOSIS — G40909 Epilepsy, unspecified, not intractable, without status epilepticus: Secondary | ICD-10-CM | POA: Diagnosis present

## 2019-03-13 DIAGNOSIS — K219 Gastro-esophageal reflux disease without esophagitis: Secondary | ICD-10-CM | POA: Diagnosis present

## 2019-03-13 DIAGNOSIS — Z8249 Family history of ischemic heart disease and other diseases of the circulatory system: Secondary | ICD-10-CM

## 2019-03-13 DIAGNOSIS — F419 Anxiety disorder, unspecified: Secondary | ICD-10-CM | POA: Diagnosis present

## 2019-03-13 DIAGNOSIS — Z833 Family history of diabetes mellitus: Secondary | ICD-10-CM

## 2019-03-13 LAB — CBC WITH DIFFERENTIAL/PLATELET
Abs Immature Granulocytes: 0.02 10*3/uL (ref 0.00–0.07)
Basophils Absolute: 0 10*3/uL (ref 0.0–0.1)
Basophils Relative: 1 %
Eosinophils Absolute: 0 10*3/uL (ref 0.0–0.5)
Eosinophils Relative: 0 %
HCT: 44.5 % (ref 36.0–46.0)
Hemoglobin: 15.2 g/dL — ABNORMAL HIGH (ref 12.0–15.0)
Immature Granulocytes: 0 %
Lymphocytes Relative: 10 %
Lymphs Abs: 0.6 10*3/uL — ABNORMAL LOW (ref 0.7–4.0)
MCH: 33.2 pg (ref 26.0–34.0)
MCHC: 34.2 g/dL (ref 30.0–36.0)
MCV: 97.2 fL (ref 80.0–100.0)
Monocytes Absolute: 0.7 10*3/uL (ref 0.1–1.0)
Monocytes Relative: 12 %
Neutro Abs: 4.8 10*3/uL (ref 1.7–7.7)
Neutrophils Relative %: 77 %
Platelets: 330 10*3/uL (ref 150–400)
RBC: 4.58 MIL/uL (ref 3.87–5.11)
RDW: 13.2 % (ref 11.5–15.5)
WBC: 6.2 10*3/uL (ref 4.0–10.5)
nRBC: 0 % (ref 0.0–0.2)

## 2019-03-13 LAB — BASIC METABOLIC PANEL
Anion gap: 15 (ref 5–15)
BUN: <5 mg/dL — ABNORMAL LOW (ref 6–20)
CO2: 25 mmol/L (ref 22–32)
Calcium: 9.6 mg/dL (ref 8.9–10.3)
Chloride: 95 mmol/L — ABNORMAL LOW (ref 98–111)
Creatinine, Ser: 0.95 mg/dL (ref 0.44–1.00)
GFR calc Af Amer: >60 mL/min (ref >60)
GFR calc non Af Amer: >60 mL/min (ref >60)
Glucose, Bld: 95 mg/dL (ref 70–99)
Potassium: 2.6 mmol/L — CL (ref 3.5–5.1)
Sodium: 135 mmol/L (ref 135–145)

## 2019-03-13 LAB — HEPATIC FUNCTION PANEL
ALT: 15 U/L (ref 0–44)
AST: 25 U/L (ref 15–41)
Albumin: 3.7 g/dL (ref 3.5–5.0)
Alkaline Phosphatase: 102 U/L (ref 38–126)
Bilirubin, Direct: 0.1 mg/dL (ref 0.0–0.2)
Indirect Bilirubin: 0.3 mg/dL (ref 0.3–0.9)
Total Bilirubin: 0.4 mg/dL (ref 0.3–1.2)
Total Protein: 6 g/dL — ABNORMAL LOW (ref 6.5–8.1)

## 2019-03-13 LAB — LIPASE, BLOOD: Lipase: 56 U/L — ABNORMAL HIGH (ref 11–51)

## 2019-03-13 MED ORDER — LEVOTHYROXINE SODIUM 75 MCG PO TABS
75.0000 ug | ORAL_TABLET | Freq: Every day | ORAL | Status: DC
  Administered 2019-03-15: 75 ug via ORAL
  Filled 2019-03-13: qty 1

## 2019-03-13 MED ORDER — AMLODIPINE BESYLATE 10 MG PO TABS
10.0000 mg | ORAL_TABLET | Freq: Every day | ORAL | Status: DC
  Administered 2019-03-13: 10 mg via ORAL
  Filled 2019-03-13: qty 2

## 2019-03-13 MED ORDER — HYDROXYZINE HCL 25 MG PO TABS
50.0000 mg | ORAL_TABLET | Freq: Every day | ORAL | Status: DC
  Administered 2019-03-13 – 2019-03-14 (×2): 50 mg via ORAL
  Filled 2019-03-13 (×2): qty 2

## 2019-03-13 MED ORDER — TOPIRAMATE 100 MG PO TABS
100.0000 mg | ORAL_TABLET | Freq: Two times a day (BID) | ORAL | Status: DC
  Administered 2019-03-13 – 2019-03-15 (×4): 100 mg via ORAL
  Filled 2019-03-13 (×3): qty 1
  Filled 2019-03-13: qty 4

## 2019-03-13 MED ORDER — POTASSIUM CHLORIDE 10 MEQ/100ML IV SOLN
10.0000 meq | INTRAVENOUS | Status: DC
  Administered 2019-03-13: 10 meq via INTRAVENOUS
  Filled 2019-03-13: qty 100

## 2019-03-13 MED ORDER — ONDANSETRON HCL 4 MG/2ML IJ SOLN
4.0000 mg | Freq: Four times a day (QID) | INTRAMUSCULAR | Status: DC | PRN
  Administered 2019-03-13: 4 mg via INTRAVENOUS
  Filled 2019-03-13: qty 2

## 2019-03-13 MED ORDER — ONDANSETRON HCL 4 MG PO TABS: 4.0000 mg | ORAL_TABLET | Freq: Four times a day (QID) | ORAL | Status: DC | PRN

## 2019-03-13 MED ORDER — LIDOCAINE VISCOUS HCL 2 % MT SOLN
15.0000 mL | Freq: Once | OROMUCOSAL | Status: AC
  Administered 2019-03-13: 15 mL via ORAL
  Filled 2019-03-13: qty 15

## 2019-03-13 MED ORDER — LISINOPRIL 20 MG PO TABS
20.0000 mg | ORAL_TABLET | Freq: Every day | ORAL | Status: DC
  Administered 2019-03-13 – 2019-03-15 (×3): 20 mg via ORAL
  Filled 2019-03-13 (×3): qty 1

## 2019-03-13 MED ORDER — CLONAZEPAM 0.5 MG PO TABS
1.0000 mg | ORAL_TABLET | Freq: Every day | ORAL | Status: DC | PRN
  Administered 2019-03-14: 10:00:00 1 mg via ORAL
  Filled 2019-03-13: qty 2

## 2019-03-13 MED ORDER — LEVETIRACETAM 500 MG PO TABS
500.0000 mg | ORAL_TABLET | Freq: Two times a day (BID) | ORAL | Status: DC
  Administered 2019-03-13 – 2019-03-15 (×4): 500 mg via ORAL
  Filled 2019-03-13 (×4): qty 1

## 2019-03-13 MED ORDER — POTASSIUM CHLORIDE 10 MEQ/100ML IV SOLN
10.0000 meq | INTRAVENOUS | Status: AC
  Administered 2019-03-13 – 2019-03-14 (×5): 10 meq via INTRAVENOUS
  Filled 2019-03-13 (×6): qty 100

## 2019-03-13 MED ORDER — ACETAMINOPHEN 325 MG PO TABS
650.0000 mg | ORAL_TABLET | Freq: Four times a day (QID) | ORAL | Status: DC | PRN
  Administered 2019-03-14: 10:00:00 650 mg via ORAL
  Filled 2019-03-13: qty 2

## 2019-03-13 MED ORDER — PANTOPRAZOLE SODIUM 40 MG PO TBEC
40.0000 mg | DELAYED_RELEASE_TABLET | Freq: Every day | ORAL | Status: DC
  Administered 2019-03-13 – 2019-03-15 (×3): 40 mg via ORAL
  Filled 2019-03-13 (×3): qty 1

## 2019-03-13 MED ORDER — SODIUM CHLORIDE 0.9 % IV BOLUS
1000.0000 mL | Freq: Once | INTRAVENOUS | Status: AC
  Administered 2019-03-13: 21:00:00 1000 mL via INTRAVENOUS

## 2019-03-13 MED ORDER — ENOXAPARIN SODIUM 40 MG/0.4ML ~~LOC~~ SOLN
40.0000 mg | SUBCUTANEOUS | Status: DC
  Administered 2019-03-13: 23:00:00 40 mg via SUBCUTANEOUS
  Filled 2019-03-13: qty 0.4

## 2019-03-13 MED ORDER — ALUM & MAG HYDROXIDE-SIMETH 200-200-20 MG/5ML PO SUSP
30.0000 mL | Freq: Once | ORAL | Status: AC
  Administered 2019-03-13: 20:00:00 30 mL via ORAL
  Filled 2019-03-13: qty 30

## 2019-03-13 MED ORDER — ACETAMINOPHEN 650 MG RE SUPP: 650.0000 mg | Freq: Four times a day (QID) | RECTAL | Status: DC | PRN

## 2019-03-13 MED ORDER — MORPHINE SULFATE (PF) 4 MG/ML IV SOLN
4.0000 mg | Freq: Once | INTRAVENOUS | Status: AC
  Administered 2019-03-13: 4 mg via INTRAVENOUS
  Filled 2019-03-13: qty 1

## 2019-03-13 MED ORDER — ONDANSETRON HCL 4 MG/2ML IJ SOLN
4.0000 mg | Freq: Once | INTRAMUSCULAR | Status: AC
  Administered 2019-03-13: 21:00:00 4 mg via INTRAVENOUS
  Filled 2019-03-13: qty 2

## 2019-03-13 MED ORDER — DIPHENHYDRAMINE HCL 50 MG/ML IJ SOLN
12.5000 mg | Freq: Once | INTRAMUSCULAR | Status: AC
  Administered 2019-03-13: 12.5 mg via INTRAVENOUS
  Filled 2019-03-13: qty 1

## 2019-03-13 MED ORDER — POTASSIUM CHLORIDE CRYS ER 20 MEQ PO TBCR
40.0000 meq | EXTENDED_RELEASE_TABLET | Freq: Once | ORAL | Status: AC
  Administered 2019-03-13: 40 meq via ORAL
  Filled 2019-03-13: qty 2

## 2019-03-13 MED ORDER — LABETALOL HCL 5 MG/ML IV SOLN
10.0000 mg | INTRAVENOUS | Status: DC | PRN
  Filled 2019-03-13: qty 4

## 2019-03-13 NOTE — ED Triage Notes (Signed)
[facility] reports pt with LUQ abdominal pain x2 days., dizzy, Headache and HTN at 180/100. Hr 88, rr 16 Pt has not had meds in a couple of weeks

## 2019-03-13 NOTE — ED Provider Notes (Addendum)
[facility] EMERGENCY DEPARTMENT Provider Note   CSN: [id number] Arrival date & time: 03/13/19  1826     History   Chief Complaint Chief Complaint  Patient presents with  . Abdominal Pain    HPI Crystal Spence is a 43 y.o. female with past medical history significant for HTN, Hx laryngeal cancer, seizure disorder, and GERD who presents to the ED with a 2-day history of LUQ abdominal discomfort.  Patient also reports that she has not been taking her antihypertensive medications and that she has been experiencing headache and lightheadedness.  She describes her LUQ and epigastric discomfort as a "burning" that is worse with meals and lying down.  She has not been taking her reflux medications.  In addition to her headache, she also endorses loose stools, unrelenting nausea and vomiting yesterday, cough, mild shortness of breath, and body aches.  She has not taken anything for her symptoms.  She denies any obvious sick contacts, fevers, chills, chest pain, visual changes, numbness or tingling, melena, or any other symptoms.  Patient lives with her husband and her daughter who have not been sick.     HPI  Past Medical History:  Diagnosis Date  . Anemia, unspecified 06/17/2013  . Headache   . History of laryngeal cancer 02/03/2013  . Hypertension   . Hypothyroidism (acquired) 06/17/2013  . Multiple lung nodules 06/28/2014  . Pneumonia 01/14/2014, 04/2017  . Poor oral hygiene 06/17/2013  . Rib pain 01/14/2014  . Rib pain on left side 01/05/2014  . Throat cancer [facility])     Patient Active Problem List   Diagnosis Date Noted  . Seizure-like activity ([place]) 05/03/2018  . Hypertensive urgency 05/03/2018  . Chronic headaches 05/03/2018  . Dysphagia 02/04/2018  . Pressure injury of skin 01/18/2018  . GI bleed 01/17/2018  . Melena   . Smoking 11/03/2017  . Seizure ([place])   . Essential hypertension 06/02/2017  . Positive pregnancy test 06/02/2017  . Bronchitis, acute   .  Observed seizure-like activity ([place]) 06/01/2017  . Medication monitoring encounter 04/29/2016  . Protein-calorie malnutrition, severe 03/19/2016  . Chronic anemia   . Thrombocytosis ([place])   . Abscess of lower lobe of right lung with pneumonia ([place])   . Hypoxemia   . Poor dentition   . Pulmonary abscess ([place]) 03/17/2016  . Dehydration 03/17/2016  . Abnormal LFTs 12/14/2015  . Iron deficiency anemia 12/04/2015  . Multiple lung nodules 06/28/2014  . Other type of nonintractable migraine 06/28/2014  . Leukopenia 06/28/2014  . Rib pain 01/14/2014  . Rib pain on left side 01/05/2014  . Unexplained weight loss 01/05/2014  . Anemia in chronic illness 06/17/2013  . Hypothyroidism (acquired) 06/17/2013  . Poor oral hygiene 06/17/2013  . Acute blood loss anemia 02/03/2013  . Syncope 02/03/2013  . Hyponatremia 02/03/2013  . History of laryngeal cancer 02/03/2013  . Hypokalemia 02/02/2013  . Alcohol abuse 02/02/2013  . Upper GI bleed 02/02/2013    Past Surgical History:  Procedure Laterality Date  . ABDOMINAL SURGERY    . BIOPSY  01/18/2018   Procedure: BIOPSY;  Surgeon: Wonda Horner, MD;  Location: [facility];  Service: Endoscopy;;  . COLONOSCOPY WITH PROPOFOL N/A 10/12/2015   Procedure: COLONOSCOPY WITH PROPOFOL;  Surgeon: Wonda Horner, MD;  Location: [facility] ENDOSCOPY;  Service: Endoscopy;  Laterality: N/A;  . ESOPHAGOGASTRODUODENOSCOPY N/A 02/03/2013   Procedure: ESOPHAGOGASTRODUODENOSCOPY (EGD);  Surgeon: Juanita Craver, MD;  Location: [facility] ENDOSCOPY;  Service: Endoscopy;  Laterality: N/A;  .  ESOPHAGOGASTRODUODENOSCOPY N/A 10/12/2015   Procedure: ESOPHAGOGASTRODUODENOSCOPY (EGD);  Surgeon: Wonda Horner, MD;  Location: [facility];  Service: Endoscopy;  Laterality: N/A;  . ESOPHAGOGASTRODUODENOSCOPY (EGD) WITH PROPOFOL N/A 01/18/2018   Procedure: ESOPHAGOGASTRODUODENOSCOPY (EGD) WITH PROPOFOL;  Surgeon: Wonda Horner, MD;  Location: [facility] ENDOSCOPY;  Service: Endoscopy;  Laterality: N/A;  .  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 TRACHEOSTOMY     for throat inflammation from radiation  . TUBAL LIGATION       OB History   No obstetric history on file.      Home Medications    Prior to Admission medications   Medication Sig Start Date End Date Taking? Authorizing Provider  albuterol (PROVENTIL HFA;VENTOLIN HFA) 108 (90 Base) MCG/ACT inhaler Inhale 2 puffs into the lungs every 2 (two) hours as needed for wheezing or shortness of breath (cough). 02/20/17   Ripley Fraise, MD  betamethasone dipropionate (DIPROLENE) 0.05 % cream Apply topically 2 (two) times daily. 01/25/19   Edrick Kins, DPM  cetirizine (ZYRTEC) 10 MG tablet TK 1 T PO QD 11/07/18   [provider]  clonazePAM (KLONOPIN) 1 MG tablet Take 1 mg by mouth daily as needed for anxiety.  12/15/15   [provider]  clotrimazole (LOTRIMIN) 1 % cream Apply to affected area 2 times daily 09/11/18   Ward, Delice Bison, DO  diltiazem (CARDIZEM CD) 240 MG 24 hr capsule Take 240 mg by mouth daily. 11/21/17   [provider]  doxycycline (VIBRA-TABS) 100 MG tablet Take 1 tablet (100 mg total) by mouth 2 (two) times daily. 11/23/18   Edrick Kins, DPM  folic acid (FOLVITE) 1 MG tablet Take 1 tablet (1 mg total) by mouth daily. 06/06/17   Eugenie Filler, MD  hydrOXYzine (VISTARIL) 50 MG capsule Take 50 mg by mouth at bedtime. 11/21/17   [provider]  levETIRAcetam (KEPPRA) 500 MG tablet Take 1 tablet (500 mg total) by mouth 2 (two) times daily. 05/04/18   Ghimire, Henreitta Leber, MD  levETIRAcetam (KEPPRA) 500 MG tablet Take 1 tablet (500 mg total) by mouth 2 (two) times daily. 09/11/18   Ward, Delice Bison, DO  levothyroxine (SYNTHROID, LEVOTHROID) 50 MCG tablet Take 1.5 tablets (75 mcg total) by mouth daily before breakfast. 05/04/18   Ghimire, Henreitta Leber, MD  Multiple  Vitamin (MULTIVITAMIN WITH MINERALS) TABS tablet Take 1 tablet by mouth daily. 06/06/17   Eugenie Filler, MD  ondansetron (ZOFRAN ODT) 4 MG disintegrating tablet Take 1 tablet (4 mg total) by mouth every 6 (six) hours as needed. 05/19/18   Ward, Delice Bison, DO  pantoprazole (PROTONIX) 40 MG tablet Take 1 tablet (40 mg total) by mouth daily. 01/19/18   Jean Rosenthal, MD  polyethylene glycol (MIRALAX / GLYCOLAX) packet Take 17 g by mouth 2 (two) times daily. Patient taking differently: Take 17 g by mouth daily as needed for mild constipation.  06/05/17   Eugenie Filler, MD  potassium chloride 20 MEQ TBCR Take 20 mEq by mouth 2 (two) times daily. 09/11/18   Ward, Delice Bison, DO  potassium chloride SA (K-DUR,KLOR-CON) 20 MEQ tablet Take 1 tablet (20 mEq total) by mouth 2 (two) times daily. 05/19/18   Ward, Delice Bison, DO  senna-docusate (SENOKOT-S) 8.6-50 MG tablet Take 1 tablet by mouth at bedtime. Patient taking differently: Take 1 tablet by mouth at  bedtime as needed for mild constipation.  06/05/17   Eugenie Filler, MD  thiamine 100 MG tablet Take 1 tablet (100 mg total) by mouth daily. 06/06/17   Eugenie Filler, MD  topiramate (TOPAMAX) 50 MG tablet Take 100 mg by mouth 2 (two) times daily.     [provider]  traZODone (DESYREL) 100 MG tablet Take 100 mg by mouth at bedtime. 08/15/15   [provider]    Family History Family History  Problem Relation Age of Onset  . Cancer Maternal Grandmother        skin cancer    Social History Social History   Tobacco Use  . Smoking status: Former Smoker    Packs/day: 1.00    Years: 15.00    Pack years: 15.00    Quit date: 09/11/2010    Years since quitting: 8.5  . Smokeless tobacco: Never Used  Substance Use Topics  . Alcohol use: Yes    Alcohol/week: 2.0 standard drinks    Types: 2 Cans of beer per week    Comment: rare  . Drug use: No    Comment: occasional     Allergies   Patient has no known allergies.   Review  of Systems Review of Systems  All other systems reviewed and are negative.    Physical Exam Updated Vital Signs Temp 98.6 F (37 C) (Oral)   Ht 5\' 1"  (1.549 m)   Wt 39.9 kg   LMP 10/20/2012 Comment: pt. has had tubal ligation,no longer has peroids  BMI 16.63 kg/m   Physical Exam Vitals signs and nursing note reviewed. Exam conducted with a chaperone present.  Constitutional:      Appearance: Normal appearance.  HENT:     Head: Normocephalic and atraumatic.  Eyes:     General: No scleral icterus.    Conjunctiva/sclera: Conjunctivae normal.  Neck:     Musculoskeletal: Normal range of motion. No neck rigidity.  Cardiovascular:     Rate and Rhythm: Normal rate and regular rhythm.     Pulses: Normal pulses.     Heart sounds: Normal heart sounds.  Pulmonary:     Effort: Pulmonary effort is normal. No respiratory distress.     Breath sounds: Normal breath sounds. No wheezing.  Abdominal:     Comments: Soft, nondistended.  Mild TTP left upper quadrant and epigastric regions.  No TTP elsewhere.  No guarding.  No overlying skin changes.  Skin:    General: Skin is dry.  Neurological:     General: No focal deficit present.     Mental Status: She is alert.     GCS: GCS eye subscore is 4. GCS verbal subscore is 5. GCS motor subscore is 6.     Cranial Nerves: No cranial nerve deficit.     Sensory: No sensory deficit.     Motor: No weakness.     Coordination: Coordination normal.  Psychiatric:        Mood and Affect: Mood normal.        Behavior: Behavior normal.        Thought Content: Thought content normal.      ED Treatments / Results  Labs (all labs ordered are listed, but only abnormal results are displayed) Labs Reviewed  BASIC METABOLIC PANEL - Abnormal; Notable for the following components:      Result Value   Potassium 2.6 (*)    Chloride 95 (*)    BUN <5 (*)    All other  components within normal limits  HEPATIC FUNCTION PANEL - Abnormal; Notable for the  following components:   Total Protein 6.0 (*)    All other components within normal limits  CBC WITH DIFFERENTIAL/PLATELET - Abnormal; Notable for the following components:   Hemoglobin 15.2 (*)    Lymphs Abs 0.6 (*)    All other components within normal limits  LIPASE, BLOOD - Abnormal; Notable for the following components:   Lipase 56 (*)    All other components within normal limits  SARS CORONAVIRUS 2 (TAT 6-24 HRS)    EKG None  Radiology Dg Chest Portable 1 View  Result Date: 03/13/2019 CLINICAL DATA:  LEFT upper quadrant abdominal pain for 2 days, dizziness, headache, hypertension EXAM: PORTABLE CHEST 1 VIEW COMPARISON:  Portable exam 1930 hours compared to 05/19/2018 FINDINGS: Normal heart size, mediastinal contours, and pulmonary vascularity. Lungs hyperinflated with multiple calcified granulomata predominantly throughout the mid to lower lungs bilaterally. No acute infiltrate, pleural effusion or pneumothorax. Biconvex thoracic scoliosis. IMPRESSION: Hyperinflated lungs and old granulomatous disease. No acute abnormalities. Electronically Signed   By: Lavonia Dana M.D.   On: 03/13/2019 19:52    Procedures .Critical Care Performed by: Corena Herter, PA-C Authorized by: Corena Herter, PA-C   Critical care provider statement:    Critical care time (minutes):  30   Critical care was necessary to treat or prevent imminent or life-threatening deterioration of the following conditions:  Metabolic crisis   Critical care was time spent personally by me on the following activities:  Ordering and performing treatments and interventions   (including critical care time)  Medications Ordered in ED Medications  diphenhydrAMINE (BENADRYL) injection 12.5 mg (has no administration in time range)  ondansetron (ZOFRAN) injection 4 mg (has no administration in time range)  morphine 4 MG/ML injection 4 mg (has no administration in time range)  potassium chloride 10 mEq in 100 mL IVPB  (has no administration in time range)  sodium chloride 0.9 % bolus 1,000 mL (has no administration in time range)  alum & mag hydroxide-simeth (MAALOX/MYLANTA) 200-200-20 MG/5ML suspension 30 mL (30 mLs Oral Given 03/13/19 1930)    And  lidocaine (XYLOCAINE) 2 % viscous mouth solution 15 mL (15 mLs Oral Given 03/13/19 1930)     Initial Impression / Assessment and Plan / ED Course  I have reviewed the triage vital signs and the nursing notes.  Pertinent labs & imaging results that were available during my care of the patient were reviewed by me and considered in my medical decision making (see chart for details).        DG chest was personally reviewed and demonstrates hyperinflated lungs with old granulomatous disease, but no acute cardiopulmonary findings such as focal consolidation or pneumothorax that could potentially explain her symptoms.  Patient reports that her left upper quadrant/epigastric discomfort resolved with the GI cocktail.  Will refill her antacid medication at discharge.  Patient agrees that her pain was likely from her GERD.  She denies any melena and her CBC showed no evidence of bleed.  Her CBC was interpreted and is unremarkable.  Her BMP however demonstrated that she was hypokalemic to 2.6.  She reports that she has had multiple sets of loose stools these past couple days in addition to significant nausea and vomiting which likely explains her electrolyte abnormality.  Will obtain EKG.  We will also start patient on IV potassium and have the patient admitted for symptomatic hypokalemia.  Given her body aches, headache, nausea  vomiting, loose stools, and cough, will also obtain diagnostic COVID-19 testing.   9:02 PM Spoke with Resident Dr. Larkin Ina from internal medicine to discuss patient admission.   Final Clinical Impressions(s) / ED Diagnoses   Final diagnoses:  Hypokalemia due to excessive gastrointestinal loss of potassium    ED Discharge Orders    None        Reita Chard 03/13/19 2103    Milton Ferguson, MD 03/14/19 1254    Corena Herter, PA-C 03/23/19 0900    Milton Ferguson, MD 03/23/19 [phone]

## 2019-03-13 NOTE — H&P (Addendum)
Date: 03/13/2019               Patient Name:  Crystal Spence MRN: [id number]  DOB: 12/22/1975 Age / Sex: 43 y.o., female   PCP: Trey Sailors, PA         Medical Service: Internal Medicine Teaching Service         Attending Physician: Dr. Aldine Contes, MD    First Contact: Dr. Ronnald Ramp Pager: [phone]  Second Contact: Dr. Eileen Stanford Pager: [phone]       After Hours (After 5p/  First Contact Pager: [phone]  weekends / holidays): Second Contact Pager: [phone]   Chief Complaint: Nausea  History of Present Illness: Patient is a 43 year old female with past medical history of hypertension, seizures, GERD, laryngeal cancer who presented with a 2-day history of abdominal pain, nausea/vomiting, diarrhea.  Patient reports emesis is nonbloody, green-yellow.  Diarrhea is without melena or bright blood.  Abdominal pain is located in the left upper quadrant, comes and goes, and feels like contractions.  Patient also reports symptoms of lightheadedness, shortness of breath with deep breathing, generalized body aches, chills and cough.  Patient denies sick contacts, fevers (has not measured), chest pain.  Patient is taking no medications for her symptoms.  Patient denies sick contacts, lives at home with her cousin.  Patient reports that she takes medications for hypertension, migraines, seizures, thyroid, but is unsure of the medication names and has been off all medications past 2 weeks.  Meds:  Below medications obtained via chart review, patient unsure of medications.  She does remember taking topiramate for migraines, and medication that "start with an L" for seizures, and clonazepam for anxiety. Current Meds  Medication Sig  . cetirizine (ZYRTEC) 10 MG tablet Take 10 mg by mouth daily.   . clonazePAM (KLONOPIN) 1 MG tablet Take 1 mg by mouth daily as needed for anxiety.   Marland Kitchen diltiazem (CARDIZEM CD) 240 MG 24 hr capsule Take 240 mg by mouth daily.  . folic acid (FOLVITE) 1 MG tablet  Take 1 tablet (1 mg total) by mouth daily.  . hydrOXYzine (VISTARIL) 50 MG capsule Take 50 mg by mouth at bedtime.  . levETIRAcetam (KEPPRA) 500 MG tablet Take 1 tablet (500 mg total) by mouth 2 (two) times daily.  Marland Kitchen levothyroxine (SYNTHROID, LEVOTHROID) 50 MCG tablet Take 1.5 tablets (75 mcg total) by mouth daily before breakfast.  . Multiple Vitamin (MULTIVITAMIN WITH MINERALS) TABS tablet Take 1 tablet by mouth daily.  . pantoprazole (PROTONIX) 40 MG tablet Take 1 tablet (40 mg total) by mouth daily.  . polyethylene glycol (MIRALAX / GLYCOLAX) packet Take 17 g by mouth 2 (two) times daily. (Patient taking differently: Take 17 g by mouth daily as needed for mild constipation. )  . potassium chloride 20 MEQ TBCR Take 20 mEq by mouth 2 (two) times daily.  Marland Kitchen senna-docusate (SENOKOT-S) 8.6-50 MG tablet Take 1 tablet by mouth at bedtime. (Patient taking differently: Take 1 tablet by mouth at bedtime as needed for mild constipation. )  . topiramate (TOPAMAX) 50 MG tablet Take 100 mg by mouth 2 (two) times daily.   . traZODone (DESYREL) 100 MG tablet Take 100 mg by mouth at bedtime.   Allergies: * Denies Allergies to medications  Past Medical History:  Diagnosis Date  . Anemia, unspecified 06/17/2013  . Headache   . History of laryngeal cancer 02/03/2013  . Hypertension   . Hypothyroidism (acquired) 06/17/2013  . Multiple lung nodules 06/28/2014  .  Pneumonia 01/14/2014, 04/2017  . Poor oral hygiene 06/17/2013  . Rib pain 01/14/2014  . Rib pain on left side 01/05/2014  . Throat cancer [facility])    Family History:  * Mother with hypertension and diabetes  Social History:  * Smokes a little less than 1 PPD, approximately 20 years duration * Drinks about 2 beers per day, last drink 1 week ago * Reports marijuana usage, denies other non-prescription/recreataional drug usage  Review of Systems: A complete ROS was negative except as per HPI.   Physical Exam: Temperature 98.6 F (37 C), temperature  source Oral, height 5\' 1"  (1.549 m), weight 39.9 kg, last menstrual period 10/20/2012. Physical Exam  Constitutional: She is well-developed, well-nourished, and in no distress.  HENT:  Head: Normocephalic and atraumatic.  Eyes: EOM are normal. Right eye exhibits no discharge. Left eye exhibits no discharge.  Neck: Normal range of motion. No tracheal deviation present.  Cardiovascular: Normal rate and regular rhythm. Exam reveals no gallop and no friction rub.  No murmur heard. Pulmonary/Chest: Effort normal and breath sounds normal. No respiratory distress. She has no wheezes. She has no rales.  Abdominal: Soft. She exhibits no distension. There is abdominal tenderness (Mild tenderness to palpation in LUQ). There is no rebound and no guarding.  Musculoskeletal: Normal range of motion.        General: No tenderness, deformity or edema.  Neurological: She is alert. Coordination normal.  Skin: Skin is warm and dry. No rash noted. She is not diaphoretic. No erythema.  Psychiatric: Memory and judgment normal.    EKG: personally reviewed my interpretation is normal sinus rhythm  CXR: personally reviewed my interpretation is no acute cardiopulmonary process  Assessment & Plan by Problem: Active Problems:   Hypokalemia  Patient is a 43 year old female with past medical history of hypertension, seizures, GERD who presents with a 2-day history of abdominal pain, vomiting, diarrhea and found to be hypokalemic.  # Hypokalemia: BMP on presentation revealed potassium 2.6.  Likely secondary to several episodes of diarrhea and emesis.  EKG without evidence of T wave changes, QTC of 474.  * Patient to receive 10 mEq IV potassium Q1HR x6. Will also give 40 mEq oral potassium and see if patient tolerates. * Magnesium level ordered * Will recheck AM BMP  # Abdominal pain with emesis and diarrhea: 2-day history of symptoms.   Patient does have a history of GERD but symptoms of diarrhea and generalized  fatigue, body aches would not be explained by gastritis.  May be viral gastroenteritis with differential to include COVID. * COVID test ordered * Zofran 4 mg Q6HR PRN for nausea - QTc of 474 * Pantoprazole 40 mg QD  # Hypertension: Blood pressure elevated with systolic greater than [id number] patient seen in room.  Patient is unsure of what medications she was taking, per chart review patient listed as taking diltiazem. * We will start patient on amlodipine 10 mg daily + lisinopril 20 mg daily + PRN labetalol 10 mg every 2 hours as needed for SBP greater than 180  # Hypothyroidism: Last TSH from 12/22 08/2017 116.  Per chart review patient takes levothyroxine 75 mcg daily. * Restart home medication of levothyroxine 75 mcg daily  # Migraines: Restart home prophylactic therapy of 100 mg twice daily + Tylenol 650 mg Q6HR PRN  # Epilepsy: Restart home medication of levetiracetam 500 mg twice daily # Anxiety: Restart home medication of hydroxizine 50 mg QHS + clonazepam 1 mg daily as needed  DVT Ppx: Lovenox 40 mg QD Dispo: Admit patient to Observation with expected length of stay less than 2 midnights.  Signed: Jeanmarie Hubert, MD 03/13/2019, 10:04 PM

## 2019-03-14 ENCOUNTER — Other Ambulatory Visit: Payer: Self-pay

## 2019-03-14 DIAGNOSIS — R1012 Left upper quadrant pain: Secondary | ICD-10-CM | POA: Diagnosis not present

## 2019-03-14 DIAGNOSIS — E872 Acidosis: Secondary | ICD-10-CM | POA: Diagnosis not present

## 2019-03-14 DIAGNOSIS — R1110 Vomiting, unspecified: Secondary | ICD-10-CM | POA: Diagnosis not present

## 2019-03-14 DIAGNOSIS — E876 Hypokalemia: Secondary | ICD-10-CM | POA: Diagnosis not present

## 2019-03-14 LAB — URINALYSIS, ROUTINE W REFLEX MICROSCOPIC
Bilirubin Urine: NEGATIVE
Glucose, UA: NEGATIVE mg/dL
Hgb urine dipstick: NEGATIVE
Ketones, ur: NEGATIVE mg/dL
Nitrite: NEGATIVE
Protein, ur: NEGATIVE mg/dL
Specific Gravity, Urine: 1.005 (ref 1.005–1.030)
pH: 7 (ref 5.0–8.0)

## 2019-03-14 LAB — BASIC METABOLIC PANEL
Anion gap: 7 (ref 5–15)
BUN: <5 mg/dL — ABNORMAL LOW (ref 6–20)
CO2: 21 mmol/L — ABNORMAL LOW (ref 22–32)
Calcium: 8.4 mg/dL — ABNORMAL LOW (ref 8.9–10.3)
Chloride: 108 mmol/L (ref 98–111)
Creatinine, Ser: 0.8 mg/dL (ref 0.44–1.00)
GFR calc Af Amer: >60 mL/min (ref >60)
GFR calc non Af Amer: >60 mL/min (ref >60)
Glucose, Bld: 92 mg/dL (ref 70–99)
Potassium: 4.1 mmol/L (ref 3.5–5.1)
Sodium: 136 mmol/L (ref 135–145)

## 2019-03-14 LAB — MAGNESIUM: Magnesium: 1.8 mg/dL (ref 1.7–2.4)

## 2019-03-14 LAB — HIV ANTIBODY (ROUTINE TESTING W REFLEX)
HIV Screen 4th Generation wRfx: NONREACTIVE
HIV Screen 4th Generation wRfx: NONREACTIVE

## 2019-03-14 LAB — SARS CORONAVIRUS 2 (TAT 6-24 HRS): SARS Coronavirus 2: NEGATIVE

## 2019-03-14 LAB — TSH: TSH: 129.54 u[IU]/mL — ABNORMAL HIGH (ref 0.350–4.500)

## 2019-03-14 MED ORDER — THIAMINE HCL 100 MG PO TABS: 100.0000 mg | ORAL_TABLET | Freq: Every day | ORAL | Status: DC

## 2019-03-14 MED ORDER — BETAMETHASONE DIPROPIONATE 0.05 % EX CREA: TOPICAL_CREAM | Freq: Two times a day (BID) | CUTANEOUS | 2 refills | Status: DC

## 2019-03-14 MED ORDER — LEVOTHYROXINE SODIUM 50 MCG PO TABS: 75.0000 ug | ORAL_TABLET | Freq: Every day | ORAL | 0 refills | Status: DC

## 2019-03-14 MED ORDER — ONDANSETRON 4 MG PO TBDP: 4.0000 mg | ORAL_TABLET | Freq: Four times a day (QID) | ORAL | 0 refills | Status: DC | PRN

## 2019-03-14 MED ORDER — CLOTRIMAZOLE 1 % EX CREA: TOPICAL_CREAM | CUTANEOUS | 0 refills | Status: DC

## 2019-03-14 MED ORDER — LACTATED RINGERS IV BOLUS
1000.0000 mL | Freq: Once | INTRAVENOUS | Status: AC
  Administered 2019-03-14: 1000 mL via INTRAVENOUS

## 2019-03-14 MED ORDER — PNEUMOCOCCAL VAC POLYVALENT 25 MCG/0.5ML IJ INJ: 0.5000 mL | INJECTION | INTRAMUSCULAR | Status: DC

## 2019-03-14 MED ORDER — DILTIAZEM HCL ER COATED BEADS 240 MG PO CP24: 240.0000 mg | ORAL_CAPSULE | Freq: Every day | ORAL | 3 refills | Status: DC

## 2019-03-14 MED ORDER — HYDROXYZINE PAMOATE 50 MG PO CAPS: 50.0000 mg | ORAL_CAPSULE | Freq: Every day | ORAL | 1 refills | Status: DC

## 2019-03-14 MED ORDER — POTASSIUM CHLORIDE 10 MEQ/100ML IV SOLN
10.0000 meq | Freq: Once | INTRAVENOUS | Status: AC
  Administered 2019-03-14: 10 meq via INTRAVENOUS

## 2019-03-14 MED ORDER — TOPIRAMATE 50 MG PO TABS: 100.0000 mg | ORAL_TABLET | Freq: Two times a day (BID) | ORAL | 3 refills | Status: DC

## 2019-03-14 MED ORDER — INFLUENZA VAC SPLIT QUAD 0.5 ML IM SUSY
0.5000 mL | PREFILLED_SYRINGE | INTRAMUSCULAR | Status: DC
  Filled 2019-03-14: qty 0.5

## 2019-03-14 MED ORDER — ALBUTEROL SULFATE HFA 108 (90 BASE) MCG/ACT IN AERS: 2.0000 | INHALATION_SPRAY | RESPIRATORY_TRACT | 3 refills | Status: DC | PRN

## 2019-03-14 MED ORDER — LEVETIRACETAM 500 MG PO TABS: 500.0000 mg | ORAL_TABLET | Freq: Two times a day (BID) | ORAL | 0 refills | Status: DC

## 2019-03-14 MED ORDER — CETIRIZINE HCL 10 MG PO TABS: 10.0000 mg | ORAL_TABLET | Freq: Every day | ORAL | 0 refills | Status: DC

## 2019-03-14 MED ORDER — LEVETIRACETAM 500 MG PO TABS: 500.0000 mg | ORAL_TABLET | Freq: Two times a day (BID) | ORAL | 1 refills | Status: DC

## 2019-03-14 MED ORDER — ENOXAPARIN SODIUM 30 MG/0.3ML ~~LOC~~ SOLN
30.0000 mg | SUBCUTANEOUS | Status: DC
  Administered 2019-03-14: 30 mg via SUBCUTANEOUS
  Filled 2019-03-14: qty 0.3

## 2019-03-14 MED ORDER — DULOXETINE HCL 30 MG PO CPEP: 30.0000 mg | ORAL_CAPSULE | Freq: Two times a day (BID) | ORAL | 3 refills | Status: DC

## 2019-03-14 MED ORDER — PANTOPRAZOLE SODIUM 40 MG PO TBEC: 40.0000 mg | DELAYED_RELEASE_TABLET | Freq: Every day | ORAL | 3 refills | Status: DC

## 2019-03-14 MED ORDER — TRAZODONE HCL 100 MG PO TABS: 100.0000 mg | ORAL_TABLET | Freq: Every day | ORAL | 2 refills | Status: DC

## 2019-03-14 MED ORDER — ENSURE ENLIVE PO LIQD
237.0000 mL | Freq: Two times a day (BID) | ORAL | Status: DC
  Administered 2019-03-15: 10:00:00 237 mL via ORAL

## 2019-03-14 NOTE — ED Notes (Signed)
Tele   Breakfast ordered  

## 2019-03-14 NOTE — ED Notes (Signed)
Spoke with Internal medicine intern to change admission order.

## 2019-03-14 NOTE — Progress Notes (Addendum)
   Subjective: HD#0   Overnight: Admitted  Today, Crystal Spence reports that she is feeling well and denies any subsequent nausea or vomiting.  Her last episode of nausea was yesterday.  She has not had breakfast this morning but was waiting to order one.  She denies diarrhea but does endorse left-sided back pain.  We did discuss the importance of staying consistent with her medications.  Objective:  Vital signs in last 24 hours: Vitals:   03/14/19 0500 03/14/19 0515 03/14/19 0530 03/14/19 0545  BP: 108/84 (!) 109/95 104/81 103/88  Pulse: 66 65 68 68  Resp: 18 11 17 17   Temp:      TempSrc:      SpO2: 94% 96% 94% 93%  Weight:      Height:       Const: In no apparent distress, lying comfortably in bed, conversational HEENT: Atraumatic, normocephalic Resp: CTA BL, no wheezes, crackles, rhonchi CV: RRR, no murmurs, gallop, rub Abd: Bowel sounds present, nondistended, nontender to palpation GU: No CVA tenderness bilaterally   Assessment/Plan:  Active Problems:   Hypokalemia  Patient is a 43 year old female with past medical history of hypertension, seizures, GERD who presents with a 2-day history of abdominal pain, vomiting, diarrhea and found to be hypokalemic.  # Hypokalemia-resolved:  BMP this morning shows K+ of 4.1 (2.6 on admission) -s/p IV potassium repletion + p.o. repletion -If no subsequent nausea or vomiting, she will be stable for discharge today  #Mild nonanion gap metabolic acidosis: This is secondary to prolonged GI loss most likely from diarrhea.  BMP on admission did show hypochloremia of 95 -Stable  # Abdominal pain with emesis and diarrhea-resolved:  This was thought to be secondary to a mild viral gastroenteritis -Zofran 4 mg Q6HR PRN for nausea - QTc of 474 -Pantoprazole 40 mg QD  # Hypertension:  BP this a.m. 116/92 -Continue lisinopril 20 mg daily -Hold home amlodipine for now  # Hypothyroidism-uncontrolled:  TSH of 129 (from 05/03/2018  116).   -Continue levothyroxine 75 mcg daily and follow-up with PCP for up titration.  We did stress the importance of medication compliance  # Migraines:  Continue prophylactic therapy of 100 mg twice daily + Tylenol 650 mg Q6HR PRN  Jean Rosenthal, MD 03/14/2019, 5:50 AM Pager: [phone] Internal Medicine Teaching Service

## 2019-03-14 NOTE — Discharge Summary (Signed)
Name: Crystal Spence MRN: [id number] DOB: 08-26-1975 43 y.o. PCP: Crystal Sailors, PA  Date of Admission: 03/13/2019  6:27 PM Date of Discharge: 03/14/2019 Attending Physician: Crystal Contes, MD  Discharge Diagnosis: 1.  Hypokalemia 2.  Mild non-anion gap metabolic acidosis 3.  Mild viral gastroenteritis 4.  Hypertension 5.  Hypothyroidism 6.  Migraines  Discharge Medications: Allergies as of 03/14/2019   No Known Allergies     Medication List    STOP taking these medications   polyethylene glycol 17 g packet Commonly known as: MIRALAX / GLYCOLAX   Potassium Chloride ER 20 MEQ Tbcr   potassium chloride SA 20 MEQ tablet Commonly known as: KLOR-CON   senna-docusate 8.6-50 MG tablet Commonly known as: Senokot-S     TAKE these medications   albuterol 108 (90 Base) MCG/ACT inhaler Commonly known as: VENTOLIN HFA Inhale 2 puffs into the lungs every 2 (two) hours as needed for wheezing or shortness of breath (cough).   betamethasone dipropionate 0.05 % cream Apply topically 2 (two) times daily.   cetirizine 10 MG tablet Commonly known as: ZYRTEC Take 1 tablet (10 mg total) by mouth daily.   clonazePAM 1 MG tablet Commonly known as: KLONOPIN Take 1 mg by mouth daily as needed for anxiety.   clotrimazole 1 % cream Commonly known as: LOTRIMIN Apply to affected area 2 times daily   diltiazem 240 MG 24 hr capsule Commonly known as: CARDIZEM CD Take 1 capsule (240 mg total) by mouth daily.   DULoxetine 30 MG capsule Commonly known as: CYMBALTA Take 1 capsule (30 mg total) by mouth 2 (two) times daily.   folic acid 1 MG tablet Commonly known as: FOLVITE Take 1 tablet (1 mg total) by mouth daily.   hydrOXYzine 50 MG capsule Commonly known as: VISTARIL Take 1 capsule (50 mg total) by mouth at bedtime.   levETIRAcetam 500 MG tablet Commonly known as: Keppra Take 1 tablet (500 mg total) by mouth 2 (two) times daily. What changed: Another medication  with the same name was removed. Continue taking this medication, and follow the directions you see here.   levothyroxine 50 MCG tablet Commonly known as: SYNTHROID Take 1.5 tablets (75 mcg total) by mouth daily before breakfast.   multivitamin with minerals Tabs tablet Take 1 tablet by mouth daily.   omeprazole 20 MG capsule Commonly known as: PRILOSEC Take 20 mg by mouth daily.   ondansetron 4 MG disintegrating tablet Commonly known as: Zofran ODT Take 1 tablet (4 mg total) by mouth every 6 (six) hours as needed.   pantoprazole 40 MG tablet Commonly known as: PROTONIX Take 1 tablet (40 mg total) by mouth daily.   thiamine 100 MG tablet Take 1 tablet (100 mg total) by mouth daily.   topiramate 50 MG tablet Commonly known as: TOPAMAX Take 2 tablets (100 mg total) by mouth 2 (two) times daily.   traZODone 100 MG tablet Commonly known as: DESYREL Take 1 tablet (100 mg total) by mouth at bedtime.       Disposition and follow-up:   [name] was discharged from [facility] in [place] condition.  At the hospital follow up visit please address:  1.  Hypokalemia: Secondary to mild viral gastroenteritis with prolonged emesis and diarrhea       Hypothyroidism-uncontrolled: Found to have a TSH of 129 (116 on May 03, 2018)  2.  Labs / imaging needed at time of follow-up: BMP  3.  Pending labs/ test  needing follow-up: None  Follow-up Appointments:   Hospital Course by problem list: 1.  Hypokalemia ; Mild non-anion gap metabolic acidosis; Mild viral gastroenteritis  [name] is a  43 year old female with past medical history of hypertension, seizures, GERD who presents with a 2-day history of abdominal pain, vomiting, diarrhea and found to be hypokalemic.  She was given both IV potassium and oral potassium repletion with a solution of her hypokalemia  2.  Hypertension: During this admission, she was treated with lisinopril and amlodipine.  She  was discharged and asked to continue her home antihypertensive (diltiazem)  3.  Hypothyroidism-uncontrolled: She was Found to have a TSH of 129 (116 on May 03, 2018).  She did not have any signs of myxedema coma and her home Synthroid 75 mcg was resumed.  Apparently, she had ran out of her medications and did not have any refills.  4.  Migraines: We will resume her home prophylactic medications as well as in addition of Tylenol.  We did not make any changes to her medications.  She was given a refill prior to discharge.  Discharge Vitals:   BP (!) 116/92   Pulse 62   Temp (!) 97.1 F (36.2 C) (Oral)   Resp 16   Ht 5\' 1"  (1.549 m)   Wt 39.7 kg   LMP 10/20/2012 Comment: pt. has had tubal ligation,no longer has peroids  SpO2 98%   BMI 16.53 kg/m   Pertinent Labs, Studies, and Procedures:  BMP Latest Ref Rng & Units 03/14/2019 03/13/2019 09/11/2018  Glucose 70 - 99 mg/dL 92 95 88  BUN 6 - 20 mg/dL <5(L) <5(L) 6  Creatinine 0.44 - 1.00 mg/dL 0.80 0.95 0.82  Sodium 135 - 145 mmol/L 136 135 136  Potassium 3.5 - 5.1 mmol/L 4.1 2.6(LL) 2.8(L)  Chloride 98 - 111 mmol/L 108 95(L) 96(L)  CO2 22 - 32 mmol/L 21(L) 25 26  Calcium 8.9 - 10.3 mg/dL 8.4(L) 9.6 10.1    Discharge Instructions: Discharge Instructions    Diet - low sodium heart healthy   Complete by: As directed    Discharge instructions   Complete by: As directed    [name],   It was a pleasure taking care of you at the hospital.  You were admitted because of low potassium in your blood which was due to the vomiting and diarrhea you have been having.  We gave you IV potassium and your lab work improved.  For your thyroid, please make sure you are compliant with the levothyroxine and follow-up with your PCP.  For your blood pressure, please continue your home medication.   Take Care!   Increase activity slowly   Complete by: As directed       Signed: Jean Rosenthal, MD 03/14/2019, 2:19 PM   Pager: [phone]  Internal Medicine Teaching Service

## 2019-03-15 DIAGNOSIS — F1290 Cannabis use, unspecified, uncomplicated: Secondary | ICD-10-CM | POA: Diagnosis present

## 2019-03-15 DIAGNOSIS — Z20828 Contact with and (suspected) exposure to other viral communicable diseases: Secondary | ICD-10-CM | POA: Diagnosis present

## 2019-03-15 DIAGNOSIS — R569 Unspecified convulsions: Secondary | ICD-10-CM

## 2019-03-15 DIAGNOSIS — F419 Anxiety disorder, unspecified: Secondary | ICD-10-CM | POA: Diagnosis present

## 2019-03-15 DIAGNOSIS — A084 Viral intestinal infection, unspecified: Secondary | ICD-10-CM

## 2019-03-15 DIAGNOSIS — G43909 Migraine, unspecified, not intractable, without status migrainosus: Secondary | ICD-10-CM | POA: Diagnosis present

## 2019-03-15 DIAGNOSIS — E039 Hypothyroidism, unspecified: Secondary | ICD-10-CM | POA: Diagnosis present

## 2019-03-15 DIAGNOSIS — E876 Hypokalemia: Secondary | ICD-10-CM | POA: Diagnosis present

## 2019-03-15 DIAGNOSIS — Z79899 Other long term (current) drug therapy: Secondary | ICD-10-CM | POA: Diagnosis not present

## 2019-03-15 DIAGNOSIS — Z87891 Personal history of nicotine dependence: Secondary | ICD-10-CM | POA: Diagnosis not present

## 2019-03-15 DIAGNOSIS — E872 Acidosis: Secondary | ICD-10-CM | POA: Diagnosis present

## 2019-03-15 DIAGNOSIS — E878 Other disorders of electrolyte and fluid balance, not elsewhere classified: Secondary | ICD-10-CM | POA: Diagnosis present

## 2019-03-15 DIAGNOSIS — G40909 Epilepsy, unspecified, not intractable, without status epilepticus: Secondary | ICD-10-CM | POA: Diagnosis present

## 2019-03-15 DIAGNOSIS — Z79890 Hormone replacement therapy (postmenopausal): Secondary | ICD-10-CM | POA: Diagnosis not present

## 2019-03-15 DIAGNOSIS — Z8249 Family history of ischemic heart disease and other diseases of the circulatory system: Secondary | ICD-10-CM | POA: Diagnosis not present

## 2019-03-15 DIAGNOSIS — T465X6A Underdosing of other antihypertensive drugs, initial encounter: Secondary | ICD-10-CM | POA: Diagnosis present

## 2019-03-15 DIAGNOSIS — K219 Gastro-esophageal reflux disease without esophagitis: Secondary | ICD-10-CM | POA: Diagnosis present

## 2019-03-15 DIAGNOSIS — R109 Unspecified abdominal pain: Secondary | ICD-10-CM | POA: Diagnosis present

## 2019-03-15 DIAGNOSIS — Z833 Family history of diabetes mellitus: Secondary | ICD-10-CM | POA: Diagnosis not present

## 2019-03-15 DIAGNOSIS — I10 Essential (primary) hypertension: Secondary | ICD-10-CM | POA: Diagnosis present

## 2019-03-15 DIAGNOSIS — Z8521 Personal history of malignant neoplasm of larynx: Secondary | ICD-10-CM | POA: Diagnosis not present

## 2019-03-15 DIAGNOSIS — Z808 Family history of malignant neoplasm of other organs or systems: Secondary | ICD-10-CM | POA: Diagnosis not present

## 2019-03-15 NOTE — Progress Notes (Signed)
   Subjective: Pt doing well this morning. Endorses improved nausea and 1 episode of diarrhea this morning. Ate over half of her breakfast this morning. No acute concerns, interested in going home today.  Objective:  Vital signs in last 24 hours: Vitals:   03/14/19 1935 03/14/19 2124 03/15/19 0617 03/15/19 0951  BP:  (!) 148/94 (!) 133/98 130/89  Pulse: 84 77    Resp:  18 16   Temp:  97.6 F (36.4 C) 97.6 F (36.4 C)   TempSrc:  Oral Oral   SpO2: 96% 98%    Weight:   39.9 kg   Height:       Physical Exam Vitals signs and nursing note reviewed.  Constitutional:      General: She is not in acute distress.    Appearance: She is not ill-appearing.     Comments: Awake, resting comfortably in bed  Abdominal:     General: Abdomen is flat. Bowel sounds are normal.     Palpations: Abdomen is soft.     Tenderness: There is no abdominal tenderness.  Skin:    General: Skin is warm and dry.  Neurological:     Mental Status: She is alert.    Assessment/Plan:  Active Problems:   Hypokalemia due to excessive gastrointestinal loss of potassium   Hypokalemia   Pt is a 43 year old F with significant PMH of HTN, seizures, GERD who presented with abdominal pain, vomiting, diarrhea and found to be hypokalemic.   Hypokalemia - resolved on 11/1 to 4.1 K 2.6 on admission. Received IV potassium and PO repletion - stable for discharge today  Abdominal pain with emesis and diarrhea - improving  Likely secondary to viral gastroenteritis. - zofran 4 mg q6h PRN for nausea - QTc of 474 - pantoprazole 40 mg QD  Hypertension - continue lisinopril 20 mg daily - hold home amlodipine for now  Hypothyroidism - uncontrolled  TSH on admission 129. Follow-up with PCP for titration   Dispo: Anticipated discharge today  Ladona Horns, MD 03/15/2019, 11:14 AM Pager: [phone]

## 2019-03-19 ENCOUNTER — Telehealth: Payer: Self-pay | Admitting: Hematology and Oncology

## 2019-03-19 NOTE — Telephone Encounter (Signed)
Patient called to reschedule missed September appointments, per patient's request appointment has been moved to 11/16.

## 2019-03-29 ENCOUNTER — Inpatient Hospital Stay (HOSPITAL_BASED_OUTPATIENT_CLINIC_OR_DEPARTMENT_OTHER): Payer: Medicaid Other | Admitting: Hematology and Oncology

## 2019-03-29 ENCOUNTER — Other Ambulatory Visit: Payer: Self-pay

## 2019-03-29 ENCOUNTER — Inpatient Hospital Stay: Payer: Medicaid Other | Attending: Hematology and Oncology

## 2019-03-29 DIAGNOSIS — Z923 Personal history of irradiation: Secondary | ICD-10-CM | POA: Insufficient documentation

## 2019-03-29 DIAGNOSIS — E039 Hypothyroidism, unspecified: Secondary | ICD-10-CM

## 2019-03-29 DIAGNOSIS — Z8521 Personal history of malignant neoplasm of larynx: Secondary | ICD-10-CM | POA: Diagnosis not present

## 2019-03-29 LAB — COMPREHENSIVE METABOLIC PANEL
ALT: 13 U/L (ref 0–44)
AST: 35 U/L (ref 15–41)
Albumin: 3.7 g/dL (ref 3.5–5.0)
Alkaline Phosphatase: 125 U/L (ref 38–126)
Anion gap: 10 (ref 5–15)
BUN: 12 mg/dL (ref 6–20)
CO2: 26 mmol/L (ref 22–32)
Calcium: 9.4 mg/dL (ref 8.9–10.3)
Chloride: 104 mmol/L (ref 98–111)
Creatinine, Ser: 1.03 mg/dL — ABNORMAL HIGH (ref 0.44–1.00)
GFR calc Af Amer: >60 mL/min (ref >60)
GFR calc non Af Amer: >60 mL/min (ref >60)
Glucose, Bld: 97 mg/dL (ref 70–99)
Potassium: 4 mmol/L (ref 3.5–5.1)
Sodium: 140 mmol/L (ref 135–145)
Total Bilirubin: 0.5 mg/dL (ref 0.3–1.2)
Total Protein: 6.7 g/dL (ref 6.5–8.1)

## 2019-03-29 LAB — CBC WITH DIFFERENTIAL/PLATELET
Abs Immature Granulocytes: 0.06 10*3/uL (ref 0.00–0.07)
Basophils Absolute: 0.1 10*3/uL (ref 0.0–0.1)
Basophils Relative: 1 %
Eosinophils Absolute: 0.1 10*3/uL (ref 0.0–0.5)
Eosinophils Relative: 1 %
HCT: 43.7 % (ref 36.0–46.0)
Hemoglobin: 14.8 g/dL (ref 12.0–15.0)
Immature Granulocytes: 1 %
Lymphocytes Relative: 6 %
Lymphs Abs: 0.4 10*3/uL — ABNORMAL LOW (ref 0.7–4.0)
MCH: 32.5 pg (ref 26.0–34.0)
MCHC: 33.9 g/dL (ref 30.0–36.0)
MCV: 95.8 fL (ref 80.0–100.0)
Monocytes Absolute: 0.6 10*3/uL (ref 0.1–1.0)
Monocytes Relative: 8 %
Neutro Abs: 5.9 10*3/uL (ref 1.7–7.7)
Neutrophils Relative %: 83 %
Platelets: 314 10*3/uL (ref 150–400)
RBC: 4.56 MIL/uL (ref 3.87–5.11)
RDW: 13.9 % (ref 11.5–15.5)
WBC: 7.2 10*3/uL (ref 4.0–10.5)
nRBC: 0 % (ref 0.0–0.2)

## 2019-03-29 LAB — TSH: TSH: 6.238 u[IU]/mL — ABNORMAL HIGH (ref 0.308–3.960)

## 2019-03-31 ENCOUNTER — Encounter: Payer: Self-pay | Admitting: Hematology and Oncology

## 2019-03-31 NOTE — Assessment & Plan Note (Signed)
The patient has been cancer free for many years I continue to encourage her to not smoke or drink as there is association between cigarette smoking and alcohol with recurrence of cancer I will discharge her from the clinic

## 2019-03-31 NOTE — Progress Notes (Signed)
[facility] OFFICE PROGRESS NOTE  Patient Care Team: Trey Sailors, [place] as PCP - General (Physician Assistant) Chester Holstein, MD (Family Medicine) Heath Lark, MD as Consulting Physician (Hematology and Oncology) Ruby Cola, MD as Consulting Physician (Otolaryngology)  ASSESSMENT & PLAN:  History of laryngeal cancer The patient has been cancer free for many years I continue to encourage her to not smoke or drink as there is association between cigarette smoking and alcohol with recurrence of cancer I will discharge her from the clinic  Hypothyroidism (acquired) She has acquired hypothyroidism due to prior surgery and radiation She has follow-up with her primary care doctor for management Her recent TSH is much improved I would defer to her primary doctor for management   No orders of the defined types were placed in this encounter.   INTERVAL HISTORY: Please see below for problem oriented charting. 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 According to the patient, she presented with sensation of sore throat. She was treated with chemotherapy weekly along with radiation therapy. The patient had placement of tracheostomy. She had unexplained weight loss in August 2015. The CT scan showed possible pneumonia and she was treated with antibiotic therapy. Last imaging study on 08/26/2014 show no evidence of recurrence of cancer  REVIEW OF SYSTEMS:   Constitutional: Denies fevers, chills or abnormal weight loss Eyes: Denies blurriness of vision Ears, nose, mouth, throat, and face: Denies mucositis or sore throat Respiratory: Denies cough, dyspnea or wheezes Cardiovascular: Denies palpitation, chest discomfort  or lower extremity swelling Gastrointestinal:  Denies nausea, heartburn or change in bowel habits Skin: Denies abnormal skin rashes Lymphatics: Denies new lymphadenopathy or easy bruising Neurological:Denies numbness, tingling or new weaknesses Behavioral/Psych: Mood is stable, no new changes  All other systems were reviewed with the patient and are negative.  I have reviewed the past medical history, past surgical history, social history and family history with the patient and they are unchanged from previous note.  ALLERGIES:  has No Known Allergies.  MEDICATIONS:  Current Outpatient Medications  Medication Sig Dispense Refill  . albuterol (VENTOLIN HFA) 108 (90 Base) MCG/ACT inhaler Inhale 2 puffs into the lungs every 2 (two) hours as needed for wheezing or shortness of breath (cough). 6.7 g 3  . betamethasone dipropionate 0.05 % cream Apply topically 2 (two) times daily. 30 g 2  . cetirizine (ZYRTEC) 10 MG tablet Take 1 tablet (10 mg total) by mouth daily. 30 tablet 0  . clonazePAM (KLONOPIN) 1 MG tablet Take 1 mg by mouth daily as needed for anxiety.     . clotrimazole (LOTRIMIN) 1 % cream Apply to affected area 2 times daily 15 g 0  . diltiazem (CARDIZEM CD) 240 MG 24 hr capsule Take 1 capsule (240 mg total) by mouth daily. 30 capsule 3  . DULoxetine (CYMBALTA) 30 MG capsule Take 1 capsule (30 mg total) by mouth 2 (two) times daily. 30 capsule 3  . folic acid (FOLVITE) 1 MG tablet Take 1 tablet (1 mg total) by mouth daily.    . hydrOXYzine (VISTARIL) 50 MG capsule Take 1 capsule (50 mg total) by mouth at bedtime. 30 capsule 1  . levETIRAcetam (KEPPRA) 500 MG tablet Take 1 tablet (500 mg total)  by mouth 2 (two) times daily. 60 tablet 1  . levothyroxine (SYNTHROID) 50 MCG tablet Take 1.5 tablets (75 mcg total) by mouth daily before breakfast. 900 tablet 0  . Multiple Vitamin (MULTIVITAMIN WITH MINERALS) TABS tablet Take 1 tablet by mouth daily.    Marland Kitchen omeprazole (PRILOSEC) 20 MG capsule  Take 20 mg by mouth daily.    . ondansetron (ZOFRAN ODT) 4 MG disintegrating tablet Take 1 tablet (4 mg total) by mouth every 6 (six) hours as needed. 20 tablet 0  . pantoprazole (PROTONIX) 40 MG tablet Take 1 tablet (40 mg total) by mouth daily. 30 tablet 3  . thiamine 100 MG tablet Take 1 tablet (100 mg total) by mouth daily.    Marland Kitchen topiramate (TOPAMAX) 50 MG tablet Take 2 tablets (100 mg total) by mouth 2 (two) times daily. 60 tablet 3  . traZODone (DESYREL) 100 MG tablet Take 1 tablet (100 mg total) by mouth at bedtime. 30 tablet 2   No current facility-administered medications for this visit.     PHYSICAL EXAMINATION: ECOG PERFORMANCE STATUS: 0 - Asymptomatic  Vitals:   03/29/19 1211  BP: 125/88  Pulse: 82  Resp: 18  Temp: 98.3 F (36.8 C)  SpO2: 96%   Filed Weights   03/29/19 1211  Weight: 93 lb 6.4 oz (42.4 kg)    GENERAL:alert, no distress and comfortable SKIN: skin color, texture, turgor are normal, no rashes or significant lesions EYES: normal, Conjunctiva are pink and non-injected, sclera clear OROPHARYNX:no exudate, no erythema and lips, buccal mucosa, and tongue normal  NECK: Well-healed surgical scar.  Neck is fibrosed from prior radiation LYMPH:  no palpable lymphadenopathy in the cervical, axillary or inguinal LUNGS: clear to auscultation and percussion with normal breathing effort HEART: regular rate & rhythm and no murmurs and no lower extremity edema ABDOMEN:abdomen soft, non-tender and normal bowel sounds Musculoskeletal:no cyanosis of digits and no clubbing  NEURO: alert & oriented x 3 with fluent speech, no focal motor/sensory deficits  LABORATORY DATA:  I have reviewed the data as listed    Component Value Date/Time   NA 140 03/29/2019 1111   NA 138 12/14/2015 1412   K 4.0 03/29/2019 1111   K 3.8 12/14/2015 1412   CL 104 03/29/2019 1111   CO2 26 03/29/2019 1111   CO2 20 (L) 12/14/2015 1412   GLUCOSE 97 03/29/2019 1111   GLUCOSE 77 12/14/2015  1412   BUN 12 03/29/2019 1111   BUN 4.6 (L) 12/14/2015 1412   CREATININE 1.03 (H) 03/29/2019 1111   CREATININE 0.7 12/14/2015 1412   CALCIUM 9.4 03/29/2019 1111   CALCIUM 9.4 12/14/2015 1412   PROT 6.7 03/29/2019 1111   PROT 7.3 12/14/2015 1412   ALBUMIN 3.7 03/29/2019 1111   ALBUMIN 3.8 12/14/2015 1412   AST 35 03/29/2019 1111   AST 107 (H) 12/14/2015 1412   ALT 13 03/29/2019 1111   ALT 27 12/14/2015 1412   ALKPHOS 125 03/29/2019 1111   ALKPHOS 178 (H) 12/14/2015 1412   BILITOT 0.5 03/29/2019 1111   BILITOT <0.30 12/14/2015 1412   GFRNONAA >60 03/29/2019 1111   GFRAA >60 03/29/2019 1111    No results found for: SPEP, UPEP  Lab Results  Component Value Date   WBC 7.2 03/29/2019   NEUTROABS 5.9 03/29/2019   HGB 14.8 03/29/2019   HCT 43.7 03/29/2019   MCV 95.8 03/29/2019   PLT 314 03/29/2019      Chemistry      Component Value Date/Time  NA 140 03/29/2019 1111   NA 138 12/14/2015 1412   K 4.0 03/29/2019 1111   K 3.8 12/14/2015 1412   CL 104 03/29/2019 1111   CO2 26 03/29/2019 1111   CO2 20 (L) 12/14/2015 1412   BUN 12 03/29/2019 1111   BUN 4.6 (L) 12/14/2015 1412   CREATININE 1.03 (H) 03/29/2019 1111   CREATININE 0.7 12/14/2015 1412      Component Value Date/Time   CALCIUM 9.4 03/29/2019 1111   CALCIUM 9.4 12/14/2015 1412   ALKPHOS 125 03/29/2019 1111   ALKPHOS 178 (H) 12/14/2015 1412   AST 35 03/29/2019 1111   AST 107 (H) 12/14/2015 1412   ALT 13 03/29/2019 1111   ALT 27 12/14/2015 1412   BILITOT 0.5 03/29/2019 1111   BILITOT <0.30 12/14/2015 1412       RADIOGRAPHIC STUDIES: I have personally reviewed the radiological images as listed and agreed with the findings in the report. Dg Chest Portable 1 View  Result Date: 03/13/2019 CLINICAL DATA:  LEFT upper quadrant abdominal pain for 2 days, dizziness, headache, hypertension EXAM: PORTABLE CHEST 1 VIEW COMPARISON:  Portable exam 1930 hours compared to 05/19/2018 FINDINGS: Normal heart size,  mediastinal contours, and pulmonary vascularity. Lungs hyperinflated with multiple calcified granulomata predominantly throughout the mid to lower lungs bilaterally. No acute infiltrate, pleural effusion or pneumothorax. Biconvex thoracic scoliosis. IMPRESSION: Hyperinflated lungs and old granulomatous disease. No acute abnormalities. Electronically Signed   By: Lavonia Dana M.D.   On: 03/13/2019 19:52    All questions were answered. The patient knows to call the clinic with any problems, questions or concerns. No barriers to learning was detected.  I spent 10 minutes counseling the patient face to face. The total time spent in the appointment was 15 minutes and more than 50% was on counseling and review of test results  Heath Lark, MD 03/31/2019 8:20 AM

## 2019-03-31 NOTE — Assessment & Plan Note (Signed)
She has acquired hypothyroidism due to prior surgery and radiation She has follow-up with her primary care doctor for management Her recent TSH is much improved I would defer to her primary doctor for management

## 2019-09-18 ENCOUNTER — Ambulatory Visit: Payer: Medicaid Other | Attending: Internal Medicine

## 2019-09-18 DIAGNOSIS — Z23 Encounter for immunization: Secondary | ICD-10-CM

## 2019-09-18 NOTE — Progress Notes (Signed)
   Covid-19 Vaccination Clinic  Name:  TOCCARRA KESTEL    MRN: [id number] DOB: 01-Dec-1975  09/18/2019  Ms. Hyder was observed post Covid-19 immunization for 15 minutes without incident. She was provided with Vaccine Information Sheet and instruction to access the V-Safe system.   Ms. Kreifels was instructed to call 911 with any severe reactions post vaccine: Marland Kitchen Difficulty breathing  . Swelling of face and throat  . A fast heartbeat  . A bad rash all over body  . Dizziness and weakness   Immunizations Administered    Name Date Dose VIS Date Route   Pfizer COVID-19 Vaccine 09/18/2019  8:32 AM 0.3 mL 07/07/2018 Intramuscular   Manufacturer: [place]   Lot: [id number]   [place]: [id number]

## 2019-10-12 ENCOUNTER — Ambulatory Visit: Payer: Medicaid Other | Attending: Internal Medicine

## 2019-10-12 DIAGNOSIS — Z23 Encounter for immunization: Secondary | ICD-10-CM

## 2019-10-12 NOTE — Progress Notes (Signed)
   Covid-19 Vaccination Clinic  Name:  Crystal Spence    MRN: [id number] DOB: 1975-08-19  10/12/2019  Crystal Spence was observed post Covid-19 immunization for 15 minutes without incident. She was provided with Vaccine Information Sheet and instruction to access the V-Safe system.   Crystal Spence was instructed to call 911 with any severe reactions post vaccine: Marland Kitchen Difficulty breathing  . Swelling of face and throat  . A fast heartbeat  . A bad rash all over body  . Dizziness and weakness   Immunizations Administered    Name Date Dose VIS Date Route   Pfizer COVID-19 Vaccine 10/12/2019  8:12 AM 0.3 mL 07/07/2018 Intramuscular   Manufacturer: [employer], [employer]   Lot: [id number]   [place]: [id number]

## 2020-01-10 IMAGING — CR DG CHEST 2V
2 series · 2 of 2 positions shown · non-contrast
Comparison: Chest radiograph January 17, 2018

CLINICAL DATA: Seizure activity, fall tonight. History of throat
cancer.

EXAM:
CHEST - 2 VIEW

[chest lat]
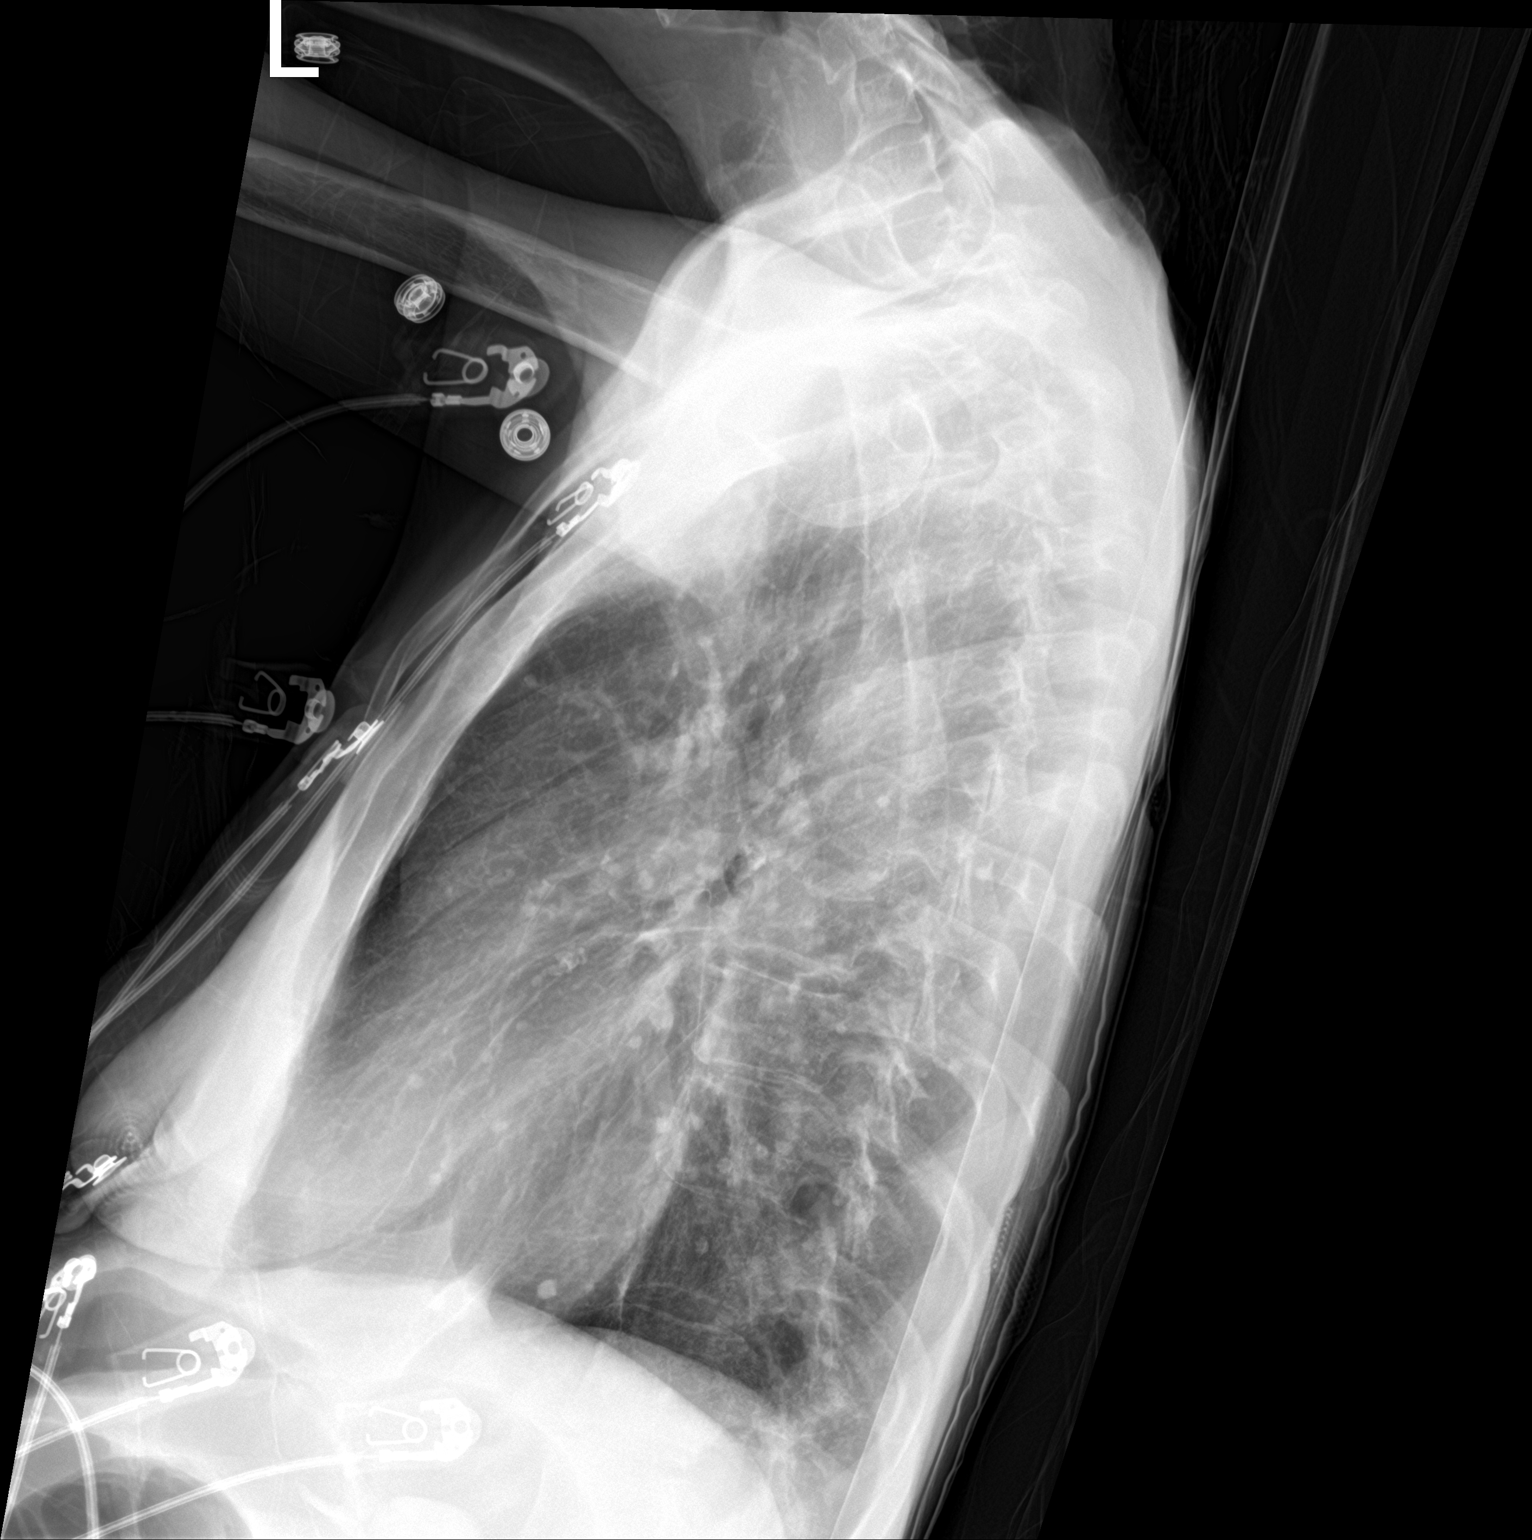

[chest ap]
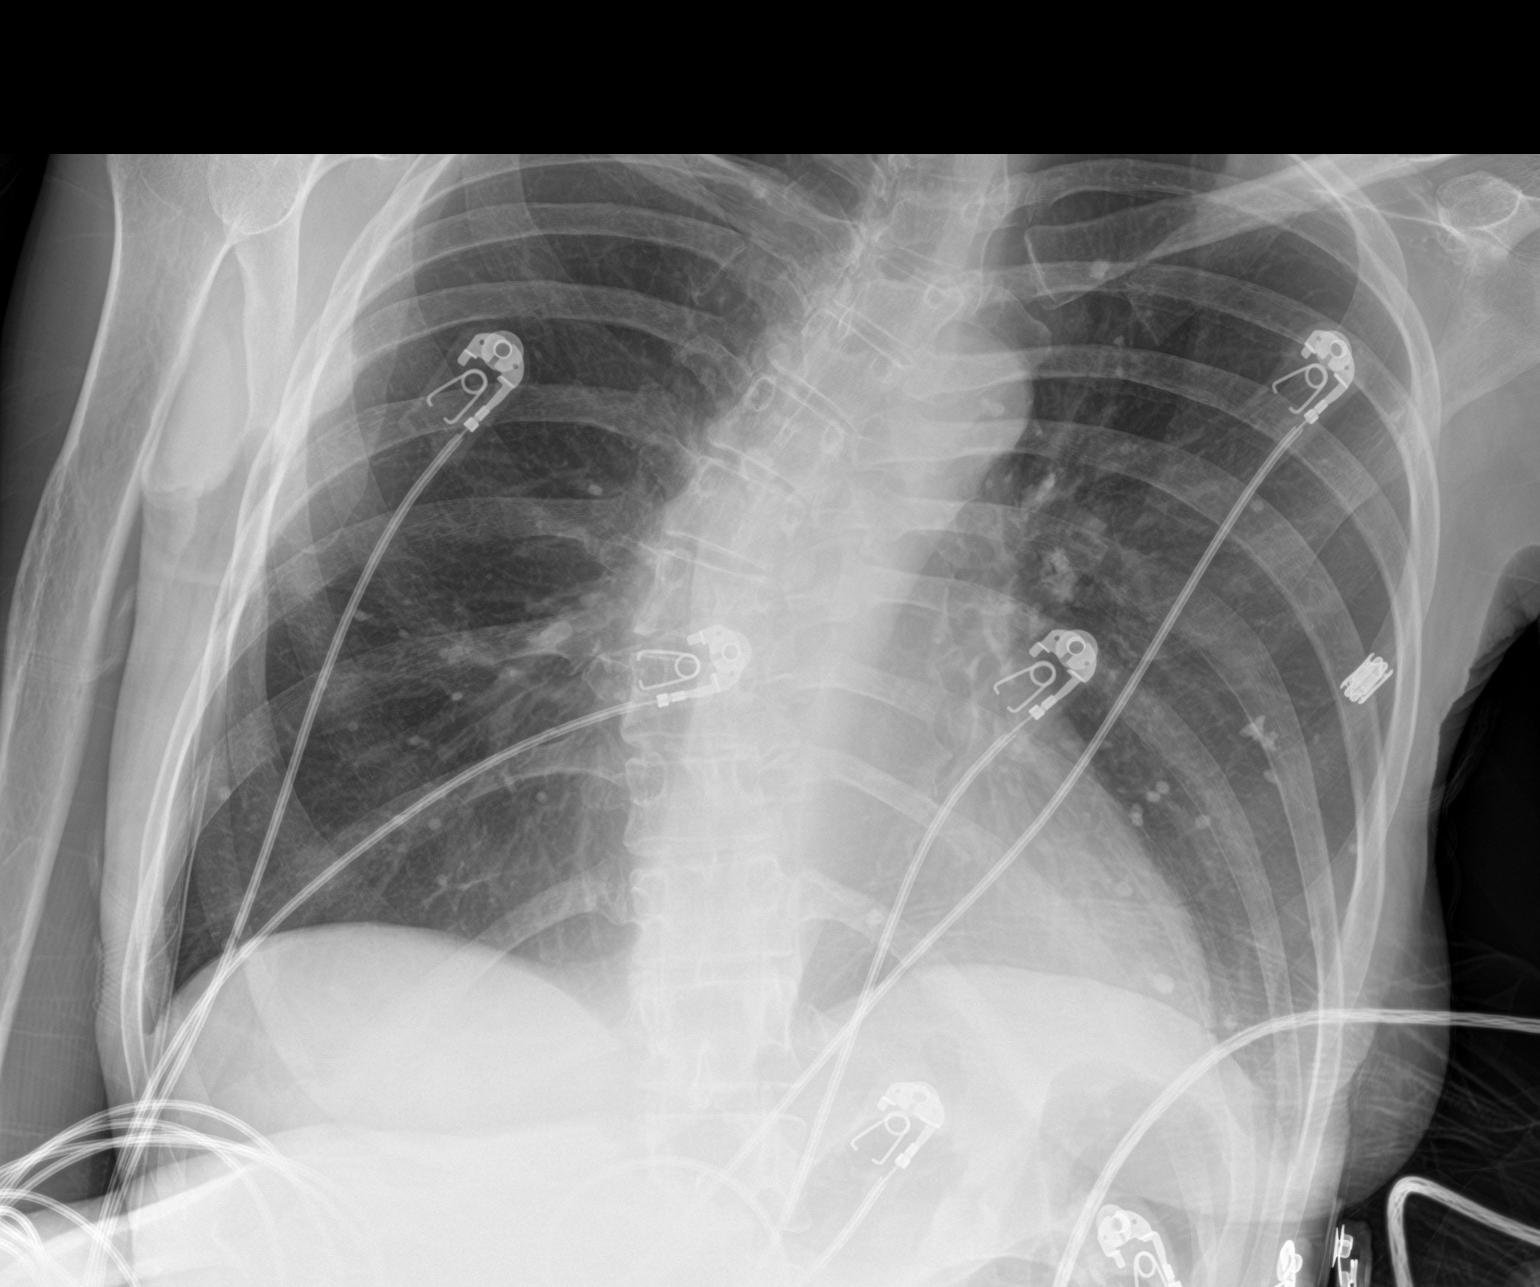

[2 of 2 positions shown; findings below may reference images not displayed]

FINDINGS: Cardiomediastinal silhouette is normal. No pleural effusions or
focal consolidations. Trachea projects midline and there is no
pneumothorax. Scattered calcified granuloma. Midthoracic
dextroscoliosis. Old RIGHT rib fracture. Soft tissue planes and
included osseous structures are non-suspicious.
IMPRESSION: 1. No acute cardiopulmonary process.
2. Old granulomatous disease.

## 2020-03-17 ENCOUNTER — Other Ambulatory Visit: Payer: Self-pay

## 2020-03-22 ENCOUNTER — Other Ambulatory Visit: Payer: Self-pay | Admitting: Physician Assistant

## 2020-03-22 DIAGNOSIS — Z1231 Encounter for screening mammogram for malignant neoplasm of breast: Secondary | ICD-10-CM

## 2020-10-21 ENCOUNTER — Ambulatory Visit: Payer: Medicaid Other

## 2021-08-14 ENCOUNTER — Emergency Department (HOSPITAL_COMMUNITY): Payer: Medicaid Other

## 2021-08-14 ENCOUNTER — Inpatient Hospital Stay (HOSPITAL_COMMUNITY)
Admission: EM | Admit: 2021-08-14 | Discharge: 2021-08-16 | DRG: 689 | Disposition: A | Discharge status: Home / Self Care | Payer: Medicaid Other | Attending: Internal Medicine | Admitting: Internal Medicine

## 2021-08-14 ENCOUNTER — Other Ambulatory Visit: Payer: Self-pay

## 2021-08-14 ENCOUNTER — Encounter (HOSPITAL_COMMUNITY): Payer: Self-pay

## 2021-08-14 DIAGNOSIS — K8020 Calculus of gallbladder without cholecystitis without obstruction: Secondary | ICD-10-CM | POA: Diagnosis present

## 2021-08-14 DIAGNOSIS — I10 Essential (primary) hypertension: Secondary | ICD-10-CM | POA: Diagnosis present

## 2021-08-14 DIAGNOSIS — R030 Elevated blood-pressure reading, without diagnosis of hypertension: Secondary | ICD-10-CM | POA: Diagnosis present

## 2021-08-14 DIAGNOSIS — N12 Tubulo-interstitial nephritis, not specified as acute or chronic: Secondary | ICD-10-CM | POA: Diagnosis not present

## 2021-08-14 DIAGNOSIS — R569 Unspecified convulsions: Secondary | ICD-10-CM

## 2021-08-14 DIAGNOSIS — I160 Hypertensive urgency: Secondary | ICD-10-CM | POA: Diagnosis present

## 2021-08-14 DIAGNOSIS — Z87891 Personal history of nicotine dependence: Secondary | ICD-10-CM

## 2021-08-14 DIAGNOSIS — Z681 Body mass index (BMI) 19 or less, adult: Secondary | ICD-10-CM

## 2021-08-14 DIAGNOSIS — R112 Nausea with vomiting, unspecified: Secondary | ICD-10-CM | POA: Diagnosis present

## 2021-08-14 DIAGNOSIS — E876 Hypokalemia: Secondary | ICD-10-CM | POA: Diagnosis present

## 2021-08-14 DIAGNOSIS — G40909 Epilepsy, unspecified, not intractable, without status epilepticus: Secondary | ICD-10-CM | POA: Diagnosis present

## 2021-08-14 DIAGNOSIS — E860 Dehydration: Secondary | ICD-10-CM | POA: Diagnosis present

## 2021-08-14 DIAGNOSIS — Z20822 Contact with and (suspected) exposure to covid-19: Secondary | ICD-10-CM | POA: Diagnosis present

## 2021-08-14 DIAGNOSIS — Z79899 Other long term (current) drug therapy: Secondary | ICD-10-CM

## 2021-08-14 DIAGNOSIS — Z808 Family history of malignant neoplasm of other organs or systems: Secondary | ICD-10-CM

## 2021-08-14 DIAGNOSIS — Z8521 Personal history of malignant neoplasm of larynx: Secondary | ICD-10-CM

## 2021-08-14 DIAGNOSIS — E43 Unspecified severe protein-calorie malnutrition: Secondary | ICD-10-CM | POA: Diagnosis present

## 2021-08-14 DIAGNOSIS — N390 Urinary tract infection, site not specified: Secondary | ICD-10-CM

## 2021-08-14 DIAGNOSIS — E039 Hypothyroidism, unspecified: Secondary | ICD-10-CM | POA: Diagnosis present

## 2021-08-14 DIAGNOSIS — F1010 Alcohol abuse, uncomplicated: Secondary | ICD-10-CM | POA: Diagnosis not present

## 2021-08-14 DIAGNOSIS — B9620 Unspecified Escherichia coli [E. coli] as the cause of diseases classified elsewhere: Secondary | ICD-10-CM | POA: Diagnosis present

## 2021-08-14 LAB — LIPASE, BLOOD: Lipase: 49 U/L (ref 11–51)

## 2021-08-14 LAB — COMPREHENSIVE METABOLIC PANEL
ALT: 17 U/L (ref 0–44)
AST: 33 U/L (ref 15–41)
Albumin: 4.4 g/dL (ref 3.5–5.0)
Alkaline Phosphatase: 77 U/L (ref 38–126)
Anion gap: 13 (ref 5–15)
BUN: 7 mg/dL (ref 6–20)
CO2: 28 mmol/L (ref 22–32)
Calcium: 10 mg/dL (ref 8.9–10.3)
Chloride: 96 mmol/L — ABNORMAL LOW (ref 98–111)
Creatinine, Ser: 0.84 mg/dL (ref 0.44–1.00)
GFR, Estimated: >60 mL/min (ref >60)
Glucose, Bld: 91 mg/dL (ref 70–99)
Potassium: 2.6 mmol/L — CL (ref 3.5–5.1)
Sodium: 137 mmol/L (ref 135–145)
Total Bilirubin: 0.8 mg/dL (ref 0.3–1.2)
Total Protein: 7.4 g/dL (ref 6.5–8.1)

## 2021-08-14 LAB — CBC
HCT: 45 % (ref 36.0–46.0)
Hemoglobin: 15.2 g/dL — ABNORMAL HIGH (ref 12.0–15.0)
MCH: 32.7 pg (ref 26.0–34.0)
MCHC: 33.8 g/dL (ref 30.0–36.0)
MCV: 96.8 fL (ref 80.0–100.0)
Platelets: 310 10*3/uL (ref 150–400)
RBC: 4.65 MIL/uL (ref 3.87–5.11)
RDW: 13.3 % (ref 11.5–15.5)
WBC: 5.3 10*3/uL (ref 4.0–10.5)
nRBC: 0 % (ref 0.0–0.2)

## 2021-08-14 LAB — URINALYSIS, ROUTINE W REFLEX MICROSCOPIC
Bacteria, UA: NONE SEEN
Bilirubin Urine: NEGATIVE
Glucose, UA: NEGATIVE mg/dL
Hgb urine dipstick: NEGATIVE
Ketones, ur: 20 mg/dL — AB
Nitrite: POSITIVE — AB
Protein, ur: 30 mg/dL — AB
Specific Gravity, Urine: >1.046 — ABNORMAL HIGH (ref 1.005–1.030)
pH: 8 (ref 5.0–8.0)

## 2021-08-14 LAB — RESP PANEL BY RT-PCR (FLU A&B, COVID) ARPGX2
Influenza A by PCR: NEGATIVE
Influenza B by PCR: NEGATIVE
SARS Coronavirus 2 by RT PCR: NEGATIVE

## 2021-08-14 LAB — MAGNESIUM: Magnesium: 2 mg/dL (ref 1.7–2.4)

## 2021-08-14 LAB — TROPONIN I (HIGH SENSITIVITY): Troponin I (High Sensitivity): 6 ng/L (ref <18)

## 2021-08-14 LAB — I-STAT BETA HCG BLOOD, ED (MC, WL, AP ONLY): I-stat hCG, quantitative: 10.8 m[IU]/mL — ABNORMAL HIGH (ref <5)

## 2021-08-14 LAB — PREGNANCY, URINE: Preg Test, Ur: NEGATIVE

## 2021-08-14 MED ORDER — POTASSIUM CHLORIDE 20 MEQ PO PACK
40.0000 meq | PACK | Freq: Once | ORAL | Status: AC
  Administered 2021-08-14: 40 meq via ORAL
  Filled 2021-08-14: qty 2

## 2021-08-14 MED ORDER — THIAMINE HCL 100 MG PO TABS
100.0000 mg | ORAL_TABLET | Freq: Every day | ORAL | Status: DC
  Administered 2021-08-14 – 2021-08-16 (×3): 100 mg via ORAL
  Filled 2021-08-14 (×3): qty 1

## 2021-08-14 MED ORDER — LEVETIRACETAM IN NACL 500 MG/100ML IV SOLN
500.0000 mg | Freq: Once | INTRAVENOUS | Status: AC
Start: 2021-08-14 — End: 2021-08-14
  Administered 2021-08-14: 500 mg via INTRAVENOUS
  Filled 2021-08-14: qty 100

## 2021-08-14 MED ORDER — LACTATED RINGERS IV BOLUS
1000.0000 mL | Freq: Once | INTRAVENOUS | Status: AC
Start: 2021-08-14 — End: 2021-08-14
  Administered 2021-08-14: 1000 mL via INTRAVENOUS

## 2021-08-14 MED ORDER — ADULT MULTIVITAMIN W/MINERALS CH
1.0000 | ORAL_TABLET | Freq: Every day | ORAL | Status: DC
  Administered 2021-08-14: 1 via ORAL
  Filled 2021-08-14 (×2): qty 1

## 2021-08-14 MED ORDER — FOLIC ACID 1 MG PO TABS
1.0000 mg | ORAL_TABLET | Freq: Every day | ORAL | Status: DC
  Administered 2021-08-14 – 2021-08-16 (×3): 1 mg via ORAL
  Filled 2021-08-14 (×3): qty 1

## 2021-08-14 MED ORDER — POTASSIUM CHLORIDE CRYS ER 20 MEQ PO TBCR
40.0000 meq | EXTENDED_RELEASE_TABLET | Freq: Once | ORAL | Status: DC
  Filled 2021-08-14: qty 2

## 2021-08-14 MED ORDER — LEVETIRACETAM IN NACL 500 MG/100ML IV SOLN
500.0000 mg | Freq: Two times a day (BID) | INTRAVENOUS | Status: DC
  Administered 2021-08-15 – 2021-08-16 (×3): 500 mg via INTRAVENOUS
  Filled 2021-08-14 (×3): qty 100

## 2021-08-14 MED ORDER — THIAMINE HCL 100 MG/ML IJ SOLN: 100.0000 mg | Freq: Every day | INTRAMUSCULAR | Status: DC

## 2021-08-14 MED ORDER — ONDANSETRON HCL 4 MG/2ML IJ SOLN
4.0000 mg | Freq: Four times a day (QID) | INTRAMUSCULAR | Status: DC | PRN
  Administered 2021-08-15 (×2): 4 mg via INTRAVENOUS
  Filled 2021-08-14 (×2): qty 2

## 2021-08-14 MED ORDER — LORAZEPAM 1 MG PO TABS: 1.0000 mg | ORAL_TABLET | ORAL | Status: DC | PRN

## 2021-08-14 MED ORDER — SODIUM CHLORIDE 0.9 % IV SOLN
1.0000 g | Freq: Once | INTRAVENOUS | Status: AC
  Administered 2021-08-15: 1 g via INTRAVENOUS
  Filled 2021-08-14: qty 10

## 2021-08-14 MED ORDER — LEVOTHYROXINE SODIUM 50 MCG PO TABS: 75.0000 ug | ORAL_TABLET | Freq: Every day | ORAL | Status: DC

## 2021-08-14 MED ORDER — SODIUM CHLORIDE 0.9 % IV SOLN
1.0000 g | INTRAVENOUS | Status: DC
  Administered 2021-08-15: 1 g via INTRAVENOUS
  Filled 2021-08-14: qty 10

## 2021-08-14 MED ORDER — ACETAMINOPHEN 325 MG PO TABS
650.0000 mg | ORAL_TABLET | Freq: Four times a day (QID) | ORAL | Status: DC | PRN
  Administered 2021-08-15: 650 mg via ORAL
  Filled 2021-08-14: qty 2

## 2021-08-14 MED ORDER — LORAZEPAM 2 MG/ML IJ SOLN: 1.0000 mg | INTRAMUSCULAR | Status: DC | PRN

## 2021-08-14 MED ORDER — DILTIAZEM HCL ER COATED BEADS 240 MG PO CP24
240.0000 mg | ORAL_CAPSULE | Freq: Every day | ORAL | Status: DC
Start: 2021-08-15 — End: 2021-08-16
  Administered 2021-08-15 – 2021-08-16 (×2): 240 mg via ORAL
  Filled 2021-08-14 (×2): qty 1

## 2021-08-14 MED ORDER — ONDANSETRON HCL 4 MG PO TABS: 4.0000 mg | ORAL_TABLET | Freq: Four times a day (QID) | ORAL | Status: DC | PRN

## 2021-08-14 MED ORDER — HYDROMORPHONE HCL 1 MG/ML IJ SOLN
0.5000 mg | Freq: Once | INTRAMUSCULAR | Status: AC
  Administered 2021-08-14: 0.5 mg via INTRAVENOUS
  Filled 2021-08-14: qty 1

## 2021-08-14 MED ORDER — DILTIAZEM HCL ER COATED BEADS 120 MG PO CP24
240.0000 mg | ORAL_CAPSULE | Freq: Every day | ORAL | Status: DC
Start: 2021-08-14 — End: 2021-08-14
  Administered 2021-08-14: 240 mg via ORAL
  Filled 2021-08-14: qty 2

## 2021-08-14 MED ORDER — PANTOPRAZOLE SODIUM 40 MG IV SOLR
40.0000 mg | Freq: Once | INTRAVENOUS | Status: AC
  Administered 2021-08-14: 40 mg via INTRAVENOUS
  Filled 2021-08-14: qty 10

## 2021-08-14 MED ORDER — ENOXAPARIN SODIUM 30 MG/0.3ML IJ SOSY
30.0000 mg | PREFILLED_SYRINGE | INTRAMUSCULAR | Status: DC
  Administered 2021-08-14 – 2021-08-15 (×2): 30 mg via SUBCUTANEOUS
  Filled 2021-08-14 (×2): qty 0.3

## 2021-08-14 MED ORDER — NITROGLYCERIN 2 % TD OINT
1.0000 [in_us] | TOPICAL_OINTMENT | Freq: Once | TRANSDERMAL | Status: AC
  Administered 2021-08-14: 1 [in_us] via TOPICAL
  Filled 2021-08-14: qty 1

## 2021-08-14 MED ORDER — ACETAMINOPHEN 650 MG RE SUPP: 650.0000 mg | Freq: Four times a day (QID) | RECTAL | Status: DC | PRN

## 2021-08-14 MED ORDER — IOHEXOL 350 MG/ML SOLN
100.0000 mL | Freq: Once | INTRAVENOUS | Status: AC | PRN
  Administered 2021-08-14: 80 mL via INTRAVENOUS

## 2021-08-14 MED ORDER — ONDANSETRON HCL 4 MG/2ML IJ SOLN
4.0000 mg | Freq: Once | INTRAMUSCULAR | Status: AC
  Administered 2021-08-14: 4 mg via INTRAVENOUS
  Filled 2021-08-14: qty 2

## 2021-08-14 MED ORDER — HYDROMORPHONE HCL 1 MG/ML IJ SOLN
1.0000 mg | Freq: Once | INTRAMUSCULAR | Status: AC
Start: 2021-08-14 — End: 2021-08-14
  Administered 2021-08-14: 1 mg via INTRAVENOUS
  Filled 2021-08-14: qty 1

## 2021-08-14 MED ORDER — LEVOTHYROXINE SODIUM 75 MCG PO TABS
75.0000 ug | ORAL_TABLET | Freq: Every day | ORAL | Status: DC
  Administered 2021-08-15 – 2021-08-16 (×2): 75 ug via ORAL
  Filled 2021-08-14 (×2): qty 1

## 2021-08-14 MED ORDER — POTASSIUM CHLORIDE 10 MEQ/100ML IV SOLN
10.0000 meq | INTRAVENOUS | Status: AC
  Administered 2021-08-14 (×4): 10 meq via INTRAVENOUS
  Filled 2021-08-14 (×4): qty 100

## 2021-08-14 NOTE — Assessment & Plan Note (Addendum)
History of: ?No recent seizure activity. ?Switching keppra to IV due to N/V ?

## 2021-08-14 NOTE — ED Provider Triage Note (Signed)
Emergency Medicine Provider Triage Evaluation Note ? ?Crystal Spence , a 46 y.o. female  was evaluated in triage.  Pt complains of nausea, vomiting, diarrhea x 3 days with upper abdominal pain. ?Not taking anything at home.  ?No current chemo.  ? ?Review of Systems  ?Positive: Nausea, vomiting, diarrhea, chills, upper abdominal pain, mild SHOB ?Negative: Blood in emesis or stools, CP, fever ? ?Physical Exam  ?BP (!) 178/123 (BP Location: Right Arm)   Pulse 85   Temp 98.8 ?F (37.1 ?C) (Oral)   Resp 15   Ht '5\' 1"'$  (1.549 m)   Wt 40.8 kg   LMP 10/20/2012 Comment: pt. has had tubal ligation,no longer has peroids  SpO2 91%   BMI 17.01 kg/m?  ?Gen:   Awake, no distress   ?Resp:  Normal effort  ?MSK:   Moves extremities without difficulty  ?Other:   ? ?Medical Decision Making  ?Medically screening exam initiated at 3:33 PM.  Appropriate orders placed.  Crystal Spence was informed that the remainder of the evaluation will be completed by another provider, this initial triage assessment does not replace that evaluation, and the importance of remaining in the ED until their evaluation is complete. ? ? ?  ?Crystal Learn, PA-C ?08/14/21 1534 ? ?

## 2021-08-14 NOTE — Assessment & Plan Note (Signed)
1. Empiric rocephin ?2. UCx pending ?

## 2021-08-14 NOTE — Assessment & Plan Note (Addendum)
Due to GI losses ?Replacing K ?Mg nl ?Repeat BMP in AM ?

## 2021-08-14 NOTE — ED Triage Notes (Signed)
Patient presents via [facility] for N/V/D x3 days and SHOB starting this AM. VSS. Did not take any meds at home to help symptoms. ?

## 2021-08-14 NOTE — Assessment & Plan Note (Signed)
Will put on CIWA. ?

## 2021-08-14 NOTE — Assessment & Plan Note (Addendum)
CT AP neg for acute findings. ?? Pyelonephritis as etiology given flank pain + suspicious looking UA. ?DDx includes viral gastroenteritis, EtOH gastritis. ?1. Treating pyelo empirically ?2. zofran PRN ?3. Replace K ?

## 2021-08-14 NOTE — H&P (Signed)
?History and Physical  ? ? ?Patient: Crystal Spence UVO:[id number] DOB: Nov 09, 1975 ?DOA: 08/14/2021 ?DOS: the patient was seen and examined on 08/14/2021 ?PCP: Trey Sailors, PA  ?Patient coming from: Home ? ?Chief Complaint:  ?Chief Complaint  ?Patient presents with  ? Shortness of Breath  ? Emesis  ? Nausea  ? ?HPI: Crystal Spence is a 46 y.o. female with medical history significant of HTN, EtOH abuse, seizures on keppra, laryngeal CA in remission. ? ?Pt in to ED with c/o 3 day h/o N/V/D and epigastric abd pain. ? ?Associated B flank pain. ? ?No dysuria. ? ?Has poor PO intake due to N/V. ? ?Hasnt had any antiemetics to try at home. ? ? ?Review of Systems: As mentioned in the history of present illness. All other systems reviewed and are negative. ?Past Medical History:  ?Diagnosis Date  ? Anemia, unspecified 06/17/2013  ? Headache   ? History of laryngeal cancer 02/03/2013  ? Hypertension   ? Hypothyroidism (acquired) 06/17/2013  ? Multiple lung nodules 06/28/2014  ? Pneumonia 01/14/2014, 04/2017  ? Poor oral hygiene 06/17/2013  ? Rib pain 01/14/2014  ? Rib pain on left side 01/05/2014  ? Throat cancer ([place])   ? ?Past Surgical History:  ?Procedure Laterality Date  ? ABDOMINAL SURGERY    ? BIOPSY  01/18/2018  ? Procedure: BIOPSY;  Surgeon: Wonda Horner, MD;  Location: [facility] ENDOSCOPY;  Service: Endoscopy;;  ? COLONOSCOPY WITH PROPOFOL N/A 10/12/2015  ? Procedure: COLONOSCOPY WITH PROPOFOL;  Surgeon: Wonda Horner, MD;  Location: [facility] ENDOSCOPY;  Service: Endoscopy;  Laterality: N/A;  ? ESOPHAGOGASTRODUODENOSCOPY N/A 02/03/2013  ? Procedure: ESOPHAGOGASTRODUODENOSCOPY (EGD);  Surgeon: Juanita Craver, MD;  Location: [facility] ENDOSCOPY;  Service: Endoscopy;  Laterality: N/A;  ? ESOPHAGOGASTRODUODENOSCOPY N/A 10/12/2015  ? Procedure: ESOPHAGOGASTRODUODENOSCOPY (EGD);  Surgeon: Wonda Horner, MD;  Location: [facility] ENDOSCOPY;  Service: Endoscopy;  Laterality: N/A;  ? ESOPHAGOGASTRODUODENOSCOPY (EGD) WITH PROPOFOL N/A 01/18/2018  ? Procedure:  ESOPHAGOGASTRODUODENOSCOPY (EGD) WITH PROPOFOL;  Surgeon: Wonda Horner, MD;  Location: [facility] ENDOSCOPY;  Service: Endoscopy;  Laterality: N/A;  ? 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 Procedure: TRACHEOCUTANEOUS FISTULA REPAIR;  Surgeon: Izora Gala, MD;  Location: [place];  Service: ENT;  Laterality: N/A;  ? TRACHEOSTOMY    ? for throat inflammation from radiation  ? TUBAL LIGATION    ? ?Social History:  reports that she quit smoking about 10 years ago. She has a 15.00 pack-year smoking history. She has never used smokeless tobacco. She reports current alcohol use of about 2.0 standard drinks per week. She reports that she does not use drugs. ? ?No Known Allergies ? ?Family History  ?Problem Relation Age of Onset  ? Cancer Maternal Grandmother   ?     skin cancer  ? ? ?Prior to Admission medications   ?Medication Sig Start Date End Date Taking? Authorizing Provider  ?acetaminophen (TYLENOL) 500 MG tablet Take 500-1,000 mg by mouth every 6 (six) hours as needed for mild pain or headache.   Yes [provider]  ?bisacodyl 5 MG EC tablet Take 5 mg by mouth daily as needed for mild constipation or moderate constipation.   Yes [provider]  ?diltiazem (CARDIZEM CD) 240 MG 24 hr capsule Take 1 capsule (240 mg total) by mouth daily. 03/14/19  Yes Agyei, Caprice Kluver, MD  ?DULoxetine (CYMBALTA) 30 MG capsule Take 1 capsule (30 mg total) by mouth 2 (two) times daily. ?Patient taking differently: Take 30 mg by mouth  daily. 03/14/19  Yes Jean Rosenthal, MD  ?hydrOXYzine (VISTARIL) 50 MG capsule Take 1 capsule (50 mg total) by mouth at bedtime. 03/14/19  Yes Jean Rosenthal, MD  ?traZODone (DESYREL) 100 MG tablet Take 1 tablet (100 mg total) by mouth at bedtime. 03/14/19  Yes Agyei, Caprice Kluver, MD  ?albuterol (VENTOLIN HFA) 108 (90 Base) MCG/ACT inhaler Inhale 2 puffs into the lungs every 2 (two) hours as needed for wheezing or shortness of breath (cough). ?Patient not taking: Reported on 08/14/2021 03/14/19   Jean Rosenthal, MD  ?betamethasone dipropionate 0.05 % cream Apply topically 2 (two) times daily. ?Patient not taking: Reported on 08/14/2021 03/14/19   Jean Rosenthal, MD  ?cetirizine (ZYRTEC) 10 MG tablet Take 1 tablet (10 mg total) by mouth daily. ?Patient not taking: Reported on 08/14/2021 03/14/19   Jean Rosenthal, MD  ?clonazePAM (KLONOPIN) 1 MG tablet Take 1 mg by mouth daily as needed for anxiety.  ?Patient not taking: Reported on 08/14/2021 12/15/15   [provider]  ?clotrimazole (LOTRIMIN) 1 % cream Apply to affected area 2 times daily ?Patient not taking: Reported on 08/14/2021 03/14/19   Jean Rosenthal, MD  ?folic acid (FOLVITE) 1 MG tablet Take 1 tablet (1 mg total) by mouth daily. ?Patient not taking: Reported on 08/14/2021 06/06/17   Eugenie Filler, MD  ?levETIRAcetam (KEPPRA) 500 MG tablet Take 1 tablet (500 mg total) by mouth 2 (two) times daily. ?Patient not taking: Reported on 08/14/2021 03/14/19   Jean Rosenthal, MD  ?levothyroxine (SYNTHROID) 50 MCG tablet Take 1.5 tablets (75 mcg total) by mouth daily before breakfast. ?Patient not taking: Reported on 08/14/2021 03/14/19   Jean Rosenthal, MD  ?Multiple Vitamin (MULTIVITAMIN WITH MINERALS) TABS tablet Take 1 tablet by mouth daily. ?Patient not taking: Reported on 08/14/2021 06/06/17   Eugenie Filler, MD  ?omeprazole (PRILOSEC) 20 MG capsule Take 20 mg by mouth daily. 01/05/19   [provider]  ?ondansetron (ZOFRAN ODT) 4 MG disintegrating tablet Take 1 tablet (4 mg total) by mouth every 6 (six) hours as needed. ?Patient not taking: Reported on 08/14/2021 03/14/19   Jean Rosenthal, MD  ?pantoprazole (PROTONIX) 40 MG tablet Take 1 tablet (40 mg total) by mouth daily. ?Patient not taking: Reported on 08/14/2021 03/14/19   Jean Rosenthal, MD  ?thiamine 100 MG tablet Take 1 tablet (100 mg total) by mouth daily. ?Patient not taking: Reported on 08/14/2021 03/14/19   Jean Rosenthal, MD  ?topiramate (TOPAMAX) 50 MG tablet Take 2 tablets (100 mg total) by mouth 2 (two) times  daily. ?Patient not taking: Reported on 08/14/2021 03/14/19   Jean Rosenthal, MD  ? ? ?Physical Exam: ?Vitals:  ? 08/14/21 2100 08/14/21 2200 08/14/21 2215 08/14/21 2300  ?BP: (!) 154/102 (!) 155/109 (!) 153/107 (!) 159/106  ?Pulse: 74 65 68 87  ?Resp: '20 18 13 18  '$ ?Temp:      ?TempSrc:      ?SpO2: 98% 93% 94% 99%  ?Weight:      ?Height:      ? ?Constitutional: NAD, calm, comfortable ?Eyes: PERRL, lids and conjunctivae normal ?ENMT: Mucous membranes are dry. Posterior pharynx clear of any exudate or lesions.Normal dentition.  ?Neck: normal, supple, no masses, no thyromegaly ?Respiratory: clear to auscultation bilaterally, no wheezing, no crackles. Normal respiratory effort. No accessory muscle use.  ?Cardiovascular: Regular rate and rhythm, no murmurs / rubs / gallops. No extremity edema. 2+ pedal pulses. No carotid bruits.  ?  Abdomen: L side and epigastric TTP. ?Musculoskeletal: no clubbing / cyanosis. No joint deformity upper and lower extremities. Good ROM, no contractures. Normal muscle tone.  ?Skin: no rashes, lesions, ulcers. No induration ?Neurologic: CN 2-12 grossly intact. Sensation intact, DTR normal. Strength 5/5 in all 4.  ?Psychiatric: Normal judgment and insight. Alert and oriented x 3. Normal mood.  ? ?Data Reviewed: ? ?K 2.6 ?CBC nl ?uPreg is neg ?COVID and flu neg ? ?Urinalysis ?   ?Component Value Date/Time  ? COLORURINE YELLOW 08/14/2021 1553  ? APPEARANCEUR HAZY (A) 08/14/2021 1553  ? LABSPEC >1.046 (H) 08/14/2021 1553  ? PHURINE 8.0 08/14/2021 1553  ? GLUCOSEU NEGATIVE 08/14/2021 1553  ? HGBUR NEGATIVE 08/14/2021 1553  ? BILIRUBINUR NEGATIVE 08/14/2021 1553  ? KETONESUR 20 (A) 08/14/2021 1553  ? PROTEINUR 30 (A) 08/14/2021 1553  ? UROBILINOGEN 0.2 12/06/2013 1718  ? NITRITE POSITIVE (A) 08/14/2021 1553  ? LEUKOCYTESUR SMALL (A) 08/14/2021 1553  ?WBC 11-20 ? ?(Equivocal for UTI) ? ?CTA CAP = no acute findings in chest.  Diffuse bladder wall thickening, ? Cystitis.  Cholelithiasis. ? ?Lipase NL, LFTs  nl ? ?Assessment and Plan: ?* Nausea and vomiting ?CT AP neg for acute findings. ?? Pyelonephritis as etiology given flank pain + suspicious looking UA. ?DDx includes viral gastroenteritis, EtOH gastritis. ?T

## 2021-08-14 NOTE — ED Provider Notes (Signed)
?[facility] DEPT ?Provider Note ? ? ?CSN: [id number] ?Arrival date & time: 08/14/21  1521 ? ?  ? ?History ? ?Chief Complaint  ?Patient presents with  ? Shortness of Breath  ? Emesis  ? Nausea  ? ? ?Crystal Spence is a 46 y.o. female. ? ? ?Shortness of Breath ?Associated symptoms: cough and vomiting   ?Emesis ?Associated symptoms: cough and diarrhea   ?Patient presents for nausea, vomiting, diarrhea for the past 3 days.  She has upper abdominal pain and endorses shortness of breath.  Her medical history includes laryngeal cancer, alcohol abuse, UGIB, anemia, hypothyroidism, malnutrition, HTN, and migraine headaches.  Laryngeal cancer was treated approximately 10 years ago with surgery, chemo, and radiation.  She has been told that she is in remission.  She reports that she was in her normal state of health 4 days ago.  Over the past 3 days, with her nausea and vomiting, she has been able to tolerate very little p.o. intake.  She has also been out of her home medications, except for her trazodone, which she takes at night.  She has been able to tolerate small bites and small sips.  Her vomiting and diarrhea have persisted into today.  She has not had any antiemetic medication to take at home. ?  ? ?Home Medications ?Prior to Admission medications   ?Medication Sig Start Date End Date Taking? Authorizing Provider  ?acetaminophen (TYLENOL) 500 MG tablet Take 500-1,000 mg by mouth every 6 (six) hours as needed for mild pain or headache.   Yes [provider]  ?bisacodyl 5 MG EC tablet Take 5 mg by mouth daily as needed for mild constipation or moderate constipation.   Yes [provider]  ?diltiazem (CARDIZEM CD) 240 MG 24 hr capsule Take 1 capsule (240 mg total) by mouth daily. 03/14/19  Yes Agyei, Caprice Kluver, MD  ?DULoxetine (CYMBALTA) 30 MG capsule Take 1 capsule (30 mg total) by mouth 2 (two) times daily. ?Patient taking differently: Take 30 mg by mouth daily. 03/14/19  Yes  Jean Rosenthal, MD  ?hydrOXYzine (VISTARIL) 50 MG capsule Take 1 capsule (50 mg total) by mouth at bedtime. 03/14/19  Yes Jean Rosenthal, MD  ?traZODone (DESYREL) 100 MG tablet Take 1 tablet (100 mg total) by mouth at bedtime. 03/14/19  Yes Agyei, Caprice Kluver, MD  ?albuterol (VENTOLIN HFA) 108 (90 Base) MCG/ACT inhaler Inhale 2 puffs into the lungs every 2 (two) hours as needed for wheezing or shortness of breath (cough). ?Patient not taking: Reported on 08/14/2021 03/14/19   Jean Rosenthal, MD  ?betamethasone dipropionate 0.05 % cream Apply topically 2 (two) times daily. ?Patient not taking: Reported on 08/14/2021 03/14/19   Jean Rosenthal, MD  ?cetirizine (ZYRTEC) 10 MG tablet Take 1 tablet (10 mg total) by mouth daily. ?Patient not taking: Reported on 08/14/2021 03/14/19   Jean Rosenthal, MD  ?clonazePAM (KLONOPIN) 1 MG tablet Take 1 mg by mouth daily as needed for anxiety.  ?Patient not taking: Reported on 08/14/2021 12/15/15   [provider]  ?clotrimazole (LOTRIMIN) 1 % cream Apply to affected area 2 times daily ?Patient not taking: Reported on 08/14/2021 03/14/19   Jean Rosenthal, MD  ?folic acid (FOLVITE) 1 MG tablet Take 1 tablet (1 mg total) by mouth daily. ?Patient not taking: Reported on 08/14/2021 06/06/17   Eugenie Filler, MD  ?levETIRAcetam (KEPPRA) 500 MG tablet Take 1 tablet (500 mg total) by mouth 2 (two) times daily. ?Patient  not taking: Reported on 08/14/2021 03/14/19   Jean Rosenthal, MD  ?levothyroxine (SYNTHROID) 50 MCG tablet Take 1.5 tablets (75 mcg total) by mouth daily before breakfast. ?Patient not taking: Reported on 08/14/2021 03/14/19   Jean Rosenthal, MD  ?Multiple Vitamin (MULTIVITAMIN WITH MINERALS) TABS tablet Take 1 tablet by mouth daily. ?Patient not taking: Reported on 08/14/2021 06/06/17   Eugenie Filler, MD  ?omeprazole (PRILOSEC) 20 MG capsule Take 20 mg by mouth daily. 01/05/19   [provider]  ?ondansetron (ZOFRAN ODT) 4 MG disintegrating tablet Take 1 tablet (4 mg total) by mouth every 6  (six) hours as needed. ?Patient not taking: Reported on 08/14/2021 03/14/19   Jean Rosenthal, MD  ?pantoprazole (PROTONIX) 40 MG tablet Take 1 tablet (40 mg total) by mouth daily. ?Patient not taking: Reported on 08/14/2021 03/14/19   Jean Rosenthal, MD  ?thiamine 100 MG tablet Take 1 tablet (100 mg total) by mouth daily. ?Patient not taking: Reported on 08/14/2021 03/14/19   Jean Rosenthal, MD  ?topiramate (TOPAMAX) 50 MG tablet Take 2 tablets (100 mg total) by mouth 2 (two) times daily. ?Patient not taking: Reported on 08/14/2021 03/14/19   Jean Rosenthal, MD  ?   ? ?Allergies    ?Patient has no known allergies.   ? ?Review of Systems   ?Review of Systems  ?Constitutional:  Positive for appetite change and fatigue.  ?Respiratory:  Positive for cough and shortness of breath.   ?Gastrointestinal:  Positive for diarrhea, nausea and vomiting.  ? ?Physical Exam ?Updated Vital Signs ?BP (!) 125/92 (BP Location: Left Arm)   Pulse 67   Temp 98.1 ?F (36.7 ?C) (Oral)   Resp 18   Ht '5\' 1"'$  (1.549 m)   Wt 40.8 kg   LMP 10/20/2012 Comment: pt. has had tubal ligation,no longer has peroids  SpO2 100%   BMI 17.01 kg/m?  ?Physical Exam ?Vitals and nursing note reviewed.  ?Constitutional:   ?   General: She is not in acute distress. ?   Appearance: She is well-developed. She is ill-appearing. She is not toxic-appearing or diaphoretic.  ?HENT:  ?   Head: Normocephalic and atraumatic.  ?   Mouth/Throat:  ?   Mouth: Mucous membranes are dry.  ?   Pharynx: Oropharynx is clear.  ?Eyes:  ?   Conjunctiva/sclera: Conjunctivae normal.  ?Cardiovascular:  ?   Rate and Rhythm: Normal rate and regular rhythm.  ?   Heart sounds: No murmur heard. ?Pulmonary:  ?   Effort: Pulmonary effort is normal. Tachypnea present. No accessory muscle usage or respiratory distress.  ?   Breath sounds: Normal breath sounds. No decreased breath sounds, wheezing, rhonchi or rales.  ?Chest:  ?   Chest wall: No tenderness.  ?Abdominal:  ?   Palpations: Abdomen is soft.  ?    Tenderness: There is abdominal tenderness (Left side and epigastrium). There is right CVA tenderness and left CVA tenderness. There is no guarding or rebound.  ?Musculoskeletal:     ?   General: No swelling. Normal range of motion.  ?   Cervical back: Neck supple.  ?   Right lower leg: No edema.  ?   Left lower leg: No edema.  ?Skin: ?   General: Skin is warm and dry.  ?   Capillary Refill: Capillary refill takes less than 2 seconds.  ?   Coloration: Skin is not cyanotic or pale.  ?Neurological:  ?   General: No focal deficit  present.  ?   Mental Status: She is alert and oriented to person, place, and time.  ?   Cranial Nerves: No cranial nerve deficit.  ?   Motor: No weakness.  ?Psychiatric:     ?   Mood and Affect: Mood normal.     ?   Behavior: Behavior normal.  ? ? ?ED Results / Procedures / Treatments   ?Labs ?(all labs ordered are listed, but only abnormal results are displayed) ?Labs Reviewed  ?COMPREHENSIVE METABOLIC PANEL - Abnormal; Notable for the following components:  ?    Result Value  ? Potassium 2.6 (*)   ? Chloride 96 (*)   ? All other components within normal limits  ?CBC - Abnormal; Notable for the following components:  ? Hemoglobin 15.2 (*)   ? All other components within normal limits  ?URINALYSIS, ROUTINE W REFLEX MICROSCOPIC - Abnormal; Notable for the following components:  ? APPearance HAZY (*)   ? Specific Gravity, Urine >1.046 (*)   ? Ketones, ur 20 (*)   ? Protein, ur 30 (*)   ? Nitrite POSITIVE (*)   ? Leukocytes,Ua SMALL (*)   ? All other components within normal limits  ?I-STAT BETA HCG BLOOD, ED ([facility], [facility], [facility] ONLY) - Abnormal; Notable for the following components:  ? I-stat hCG, quantitative 10.8 (*)   ? All other components within normal limits  ?RESP PANEL BY RT-PCR (FLU A&B, COVID) [id number]  ?URINE CULTURE  ?LIPASE, BLOOD  ?PREGNANCY, URINE  ?MAGNESIUM  ?HIV ANTIBODY (ROUTINE TESTING W REFLEX)  ?CBC  ?BASIC METABOLIC PANEL  ?TROPONIN I (HIGH SENSITIVITY)  ? ? ?EKG ?EKG  Interpretation ? ?Date/Time:  Tuesday August 14 2021 15:33:21 EDT ?Ventricular Rate:  87 ?PR Interval:  166 ?QRS Duration: 95 ?QT Interval:  394 ?QTC Calculation: 474 ?R Axis:   77 ?Text Interpretation: Sinus rhythm Biatri

## 2021-08-15 DIAGNOSIS — R112 Nausea with vomiting, unspecified: Secondary | ICD-10-CM | POA: Diagnosis not present

## 2021-08-15 DIAGNOSIS — R030 Elevated blood-pressure reading, without diagnosis of hypertension: Secondary | ICD-10-CM | POA: Diagnosis present

## 2021-08-15 DIAGNOSIS — Z20822 Contact with and (suspected) exposure to covid-19: Secondary | ICD-10-CM | POA: Diagnosis present

## 2021-08-15 DIAGNOSIS — E43 Unspecified severe protein-calorie malnutrition: Secondary | ICD-10-CM | POA: Diagnosis present

## 2021-08-15 DIAGNOSIS — I10 Essential (primary) hypertension: Secondary | ICD-10-CM | POA: Diagnosis present

## 2021-08-15 DIAGNOSIS — Z808 Family history of malignant neoplasm of other organs or systems: Secondary | ICD-10-CM | POA: Diagnosis not present

## 2021-08-15 DIAGNOSIS — G40909 Epilepsy, unspecified, not intractable, without status epilepticus: Secondary | ICD-10-CM | POA: Diagnosis present

## 2021-08-15 DIAGNOSIS — I160 Hypertensive urgency: Secondary | ICD-10-CM | POA: Diagnosis present

## 2021-08-15 DIAGNOSIS — E876 Hypokalemia: Secondary | ICD-10-CM | POA: Diagnosis present

## 2021-08-15 DIAGNOSIS — N12 Tubulo-interstitial nephritis, not specified as acute or chronic: Secondary | ICD-10-CM | POA: Diagnosis present

## 2021-08-15 DIAGNOSIS — K8020 Calculus of gallbladder without cholecystitis without obstruction: Secondary | ICD-10-CM | POA: Diagnosis present

## 2021-08-15 DIAGNOSIS — F1010 Alcohol abuse, uncomplicated: Secondary | ICD-10-CM | POA: Diagnosis present

## 2021-08-15 DIAGNOSIS — Z79899 Other long term (current) drug therapy: Secondary | ICD-10-CM | POA: Diagnosis not present

## 2021-08-15 DIAGNOSIS — Z8521 Personal history of malignant neoplasm of larynx: Secondary | ICD-10-CM | POA: Diagnosis not present

## 2021-08-15 DIAGNOSIS — Z681 Body mass index (BMI) 19 or less, adult: Secondary | ICD-10-CM | POA: Diagnosis not present

## 2021-08-15 DIAGNOSIS — B9620 Unspecified Escherichia coli [E. coli] as the cause of diseases classified elsewhere: Secondary | ICD-10-CM | POA: Diagnosis present

## 2021-08-15 DIAGNOSIS — E039 Hypothyroidism, unspecified: Secondary | ICD-10-CM | POA: Diagnosis present

## 2021-08-15 DIAGNOSIS — E860 Dehydration: Secondary | ICD-10-CM | POA: Diagnosis present

## 2021-08-15 DIAGNOSIS — Z87891 Personal history of nicotine dependence: Secondary | ICD-10-CM | POA: Diagnosis not present

## 2021-08-15 LAB — BASIC METABOLIC PANEL
Anion gap: 9 (ref 5–15)
BUN: 5 mg/dL — ABNORMAL LOW (ref 6–20)
CO2: 25 mmol/L (ref 22–32)
Calcium: 8.9 mg/dL (ref 8.9–10.3)
Chloride: 101 mmol/L (ref 98–111)
Creatinine, Ser: 0.67 mg/dL (ref 0.44–1.00)
GFR, Estimated: >60 mL/min (ref >60)
Glucose, Bld: 68 mg/dL — ABNORMAL LOW (ref 70–99)
Potassium: 3.9 mmol/L (ref 3.5–5.1)
Sodium: 135 mmol/L (ref 135–145)

## 2021-08-15 LAB — CBC
HCT: 43.9 % (ref 36.0–46.0)
Hemoglobin: 14.1 g/dL (ref 12.0–15.0)
MCH: 32.3 pg (ref 26.0–34.0)
MCHC: 32.1 g/dL (ref 30.0–36.0)
MCV: 100.7 fL — ABNORMAL HIGH (ref 80.0–100.0)
Platelets: 296 10*3/uL (ref 150–400)
RBC: 4.36 MIL/uL (ref 3.87–5.11)
RDW: 13.4 % (ref 11.5–15.5)
WBC: 5 10*3/uL (ref 4.0–10.5)
nRBC: 0 % (ref 0.0–0.2)

## 2021-08-15 LAB — HIV ANTIBODY (ROUTINE TESTING W REFLEX): HIV Screen 4th Generation wRfx: NONREACTIVE

## 2021-08-15 MED ORDER — HYDROMORPHONE HCL 1 MG/ML IJ SOLN
0.5000 mg | Freq: Once | INTRAMUSCULAR | Status: AC
  Administered 2021-08-15: 0.5 mg via INTRAVENOUS
  Filled 2021-08-15: qty 0.5

## 2021-08-15 MED ORDER — HYDRALAZINE HCL 25 MG PO TABS: 25.0000 mg | ORAL_TABLET | Freq: Three times a day (TID) | ORAL | Status: DC | PRN

## 2021-08-15 MED ORDER — SODIUM CHLORIDE 0.9 % IV SOLN: INTRAVENOUS | Status: DC

## 2021-08-15 MED ORDER — MELATONIN 3 MG PO TABS
3.0000 mg | ORAL_TABLET | Freq: Once | ORAL | Status: AC | PRN
  Administered 2021-08-15: 3 mg via ORAL
  Filled 2021-08-15: qty 1

## 2021-08-15 MED ORDER — PNEUMOCOCCAL 20-VAL CONJ VACC 0.5 ML IM SUSY
0.5000 mL | PREFILLED_SYRINGE | INTRAMUSCULAR | Status: DC | PRN
  Filled 2021-08-15: qty 0.5

## 2021-08-15 MED ORDER — TRAMADOL HCL 50 MG PO TABS
50.0000 mg | ORAL_TABLET | Freq: Four times a day (QID) | ORAL | Status: DC | PRN
  Administered 2021-08-15: 50 mg via ORAL
  Filled 2021-08-15: qty 1

## 2021-08-15 NOTE — Progress Notes (Signed)
?PROGRESS NOTE ? ? ? ?Crystal Spence  TJQ:[id number] DOB: [birth year] DOA: 08/14/2021 ?PCP: Trey Sailors, PA  ? ? ?Brief Narrative:  ?Crystal Spence is a 46 year old female with past medical history significant for essential hypertension, EtOH abuse, seizure disorder on Keppra, laryngeal cancer in remission who presented to [facility] ED on 4/4 with 3-day history of progressive nausea/vomiting/diarrhea and epigastric abdominal pain.  Patient also reports pain to her bilateral flanks with associated decreased oral intake.  Denies dysuria.  No other significant complaints at this time. ? ?In the ED, temperature 98.8 ?F, HR 85, RR a 15, BP 178/123, SPO2 91% on room air. ? ? ? ?Assessment & Plan: ?  ? ?Pyelonephritis ?Urinary tract infection ?Patient presenting to ED with 3-day history of progressive nausea/vomiting/diarrhea, and abdominal pain.  Patient is afebrile without leukocytosis. + CVA tenderness on physical exam.  hCG negative.  Urinalysis with small leukocytes, positive nitrite, 11-20 WBCs.  CT abdomen/pelvis with mild diffuse bladder wall thickening consistent with cystitis, cholelithiasis, otherwise unrevealing.  Chest x-ray with no acute cardiopulmonary disease process.  Lipase 49, within normal limits.  LFTs within normal limits. ?--Urine culture: Pending ?--IVF hydration ?--Ceftriaxone 1 g IV every 24 hours ? ?Hypokalemia ?Potassium 2.6 on admission, repleted.  Repeat 3.9 this morning. ?--Follow electrolytes daily ? ?Hypertensive urgency ?Hx essential hypertension ?Patient with elevated blood pressure up to 206/119 on ED presentation. ?--Diltiazem CD 240 mg p.o. daily ? ?Hx seizure disorder ?--Keppra 500 mg IV every 12 hours until nausea/vomiting improves ? ?EtOH abuse ?Counseled on need for complete cessation. ?--CIWA protocol with symptom triggered Ativan ?--Thiamine, folate, multivitamin ? ?Hypothyroidism ?--Levothyroxine 75 mcg p.o. daily ? ?Hx Laryngeal cancer ?Previously followed with medical  oncology outpatient, Dr. Alvy Bimler; given cancer free for many years she was discharged from the oncology clinic back in 2020. ? ?Severe protein calorie malnutrition ?Body mass index is 17.01 kg/m?. ?--Dietitian consult ? ? ?DVT prophylaxis: enoxaparin (LOVENOX) injection 30 mg Start: 08/14/21 2200 ? ?  Code Status: Full Code ?Family Communication: No family present at bedside this morning ? ?Disposition Plan:  ?Level of care: Med-Surg ?Status is: Observation ?The patient remains OBS appropriate and will d/c before 2 midnights. ?  ? ?Consultants:  ?None ? ?Procedures:  ?None ? ?Antimicrobials:  ?Ceftriaxone 4/4>> ? ? ?Subjective: ?Patient seen examined bedside, resting comfortably.  Feels slightly better than yesterday.  Continues with generalized abdominal discomfort and right flank pain.  Continues with nausea, no further vomiting.  But unable to tolerate further advancement of diet today.  We will restart IV fluids, continue pain control and IV antibiotics awaiting for urine culture results.  No other questions or concerns at this time.  Denies headache, no dizziness, no chest pain, palpitations, no shortness of breath, no cough/congestion, no diarrhea, no weakness, no fatigue, no paresthesias.  No acute events overnight per nurse staff. ? ?Objective: ?Vitals:  ? 08/14/21 2300 08/15/21 0127 08/15/21 0549 08/15/21 0940  ?BP: (!) 159/106 (!) 125/92 127/89 118/89  ?Pulse: 87 67 62 (!) 56  ?Resp: '18 18 18 18  '$ ?Temp: 98.6 ?F (37 ?C) 98.1 ?F (36.7 ?C) 98.1 ?F (36.7 ?C) 98 ?F (36.7 ?C)  ?TempSrc:  Oral Oral   ?SpO2: 99% 100% 98% 98%  ?Weight:      ?Height:      ? ? ?Intake/Output Summary (Last 24 hours) at 08/15/2021 1239 ?Last data filed at 08/15/2021 0900 ?Gross per 24 hour  ?Intake 1700 ml  ?Output 500 ml  ?Net 1200 ml  ? ?  Filed Weights  ? 08/14/21 1529  ?Weight: 40.8 kg  ? ? ?Examination: ? ?Physical Exam: ?GEN: NAD, alert and oriented x 3, chronically ill in appearance, thin/cachectic in appearance ?HEENT: NCAT, PERRL,  EOMI, sclera clear, MMM ?PULM: CTAB w/o wheezes/crackles, normal respiratory effort, on room air ?CV: RRR w/o M/G/R ?GI: abd soft, nondistended, mild diffuse tenderness to palpation, NABS, no R/G/M, + CVA tenderness right side ?MSK: no peripheral edema, muscle strength globally intact 5/5 bilateral upper/lower extremities ?NEURO: CN II-XII intact, no focal deficits, sensation to light touch intact ?PSYCH: normal mood/affect ?Integumentary: dry/intact, no rashes or wounds ? ? ? ?Data Reviewed: I have personally reviewed following labs and imaging studies ? ?CBC: ?Recent Labs  ?Lab 08/14/21 ?1553 08/15/21 ?0410  ?WBC 5.3 5.0  ?HGB 15.2* 14.1  ?HCT 45.0 43.9  ?MCV 96.8 100.7*  ?PLT 310 296  ? ?Basic Metabolic Panel: ?Recent Labs  ?Lab 08/14/21 ?1553 08/15/21 ?0410  ?NA 137 135  ?K 2.6* 3.9  ?CL 96* 101  ?CO2 28 25  ?GLUCOSE 91 68*  ?BUN 7 5*  ?CREATININE 0.84 0.67  ?CALCIUM 10.0 8.9  ?MG 2.0  --   ? ?GFR: ?Estimated Creatinine Clearance: 56 mL/min (by C-G formula based on SCr of 0.67 mg/dL). ?Liver Function Tests: ?Recent Labs  ?Lab 08/14/21 ?1553  ?AST 33  ?ALT 17  ?ALKPHOS 77  ?BILITOT 0.8  ?PROT 7.4  ?ALBUMIN 4.4  ? ?Recent Labs  ?Lab 08/14/21 ?1553  ?LIPASE 49  ? ?No results for input(s): AMMONIA in the last 168 hours. ?Coagulation Profile: ?No results for input(s): INR, PROTIME in the last 168 hours. ?Cardiac Enzymes: ?No results for input(s): CKTOTAL, CKMB, CKMBINDEX, TROPONINI in the last 168 hours. ?BNP (last 3 results) ?No results for input(s): PROBNP in the last 8760 hours. ?HbA1C: ?No results for input(s): HGBA1C in the last 72 hours. ?CBG: ?No results for input(s): GLUCAP in the last 168 hours. ?Lipid Profile: ?No results for input(s): CHOL, HDL, LDLCALC, TRIG, CHOLHDL, LDLDIRECT in the last 72 hours. ?Thyroid Function Tests: ?No results for input(s): TSH, T4TOTAL, FREET4, T3FREE, THYROIDAB in the last 72 hours. ?Anemia Panel: ?No results for input(s): VITAMINB12, FOLATE, FERRITIN, TIBC, IRON, RETICCTPCT in  the last 72 hours. ?Sepsis Labs: ?No results for input(s): PROCALCITON, LATICACIDVEN in the last 168 hours. ? ?Recent Results (from the past 240 hour(s))  ?Resp Panel by RT-PCR (Flu A&B, Covid) Nasopharyngeal Swab     Status: None  ? Collection Time: 08/14/21  6:25 PM  ? Specimen: Nasopharyngeal Swab; Nasopharyngeal(NP) swabs in vial transport medium  ?Result Value Ref Range Status  ? SARS Coronavirus 2 by RT PCR NEGATIVE NEGATIVE Final  ?  Comment: (NOTE) ?SARS-CoV-2 target nucleic acids are NOT DETECTED. ? ?The SARS-CoV-2 RNA is generally detectable in upper respiratory ?specimens during the acute phase of infection. The lowest ?concentration of SARS-CoV-2 viral copies this assay can detect is ?138 copies/mL. A negative result does not preclude SARS-Cov-2 ?infection and should not be used as the sole basis for treatment or ?other patient management decisions. A negative result may occur with  ?improper specimen collection/handling, submission of specimen other ?than nasopharyngeal swab, presence of viral mutation(s) within the ?areas targeted by this assay, and inadequate number of viral ?copies(<138 copies/mL). A negative result must be combined with ?clinical observations, patient history, and epidemiological ?information. The expected result is Negative. ? ?Fact Sheet for Patients:  ?EntrepreneurPulse.com.au ? ?Fact Sheet for Healthcare Providers:  ?IncredibleEmployment.be ? ?This test is no t yet approved or cleared  by the [place] and  ?has been authorized for detection and/or diagnosis of SARS-CoV-2 by ?FDA under an Emergency Use Authorization (EUA). This EUA will remain  ?in effect (meaning this test can be used) for the duration of the ?COVID-19 declaration under Section 564(b)(1) of the Act, 21 ?U.S.C.section 360bbb-3(b)(1), unless the authorization is terminated  ?or revoked sooner.  ? ? ?  ? Influenza A by PCR NEGATIVE NEGATIVE Final  ? Influenza B by PCR  NEGATIVE NEGATIVE Final  ?  Comment: (NOTE) ?The Xpert Xpress SARS-CoV-2/FLU/RSV plus assay is intended as an aid ?in the diagnosis of influenza from Nasopharyngeal swab specimens and ?should not be used as a

## 2021-08-16 DIAGNOSIS — N12 Tubulo-interstitial nephritis, not specified as acute or chronic: Secondary | ICD-10-CM | POA: Diagnosis not present

## 2021-08-16 DIAGNOSIS — E876 Hypokalemia: Secondary | ICD-10-CM | POA: Diagnosis not present

## 2021-08-16 DIAGNOSIS — F1010 Alcohol abuse, uncomplicated: Secondary | ICD-10-CM | POA: Diagnosis not present

## 2021-08-16 DIAGNOSIS — R112 Nausea with vomiting, unspecified: Secondary | ICD-10-CM | POA: Diagnosis not present

## 2021-08-16 LAB — CBC
HCT: 38.3 % (ref 36.0–46.0)
Hemoglobin: 12.3 g/dL (ref 12.0–15.0)
MCH: 32.5 pg (ref 26.0–34.0)
MCHC: 32.1 g/dL (ref 30.0–36.0)
MCV: 101.1 fL — ABNORMAL HIGH (ref 80.0–100.0)
Platelets: 241 10*3/uL (ref 150–400)
RBC: 3.79 MIL/uL — ABNORMAL LOW (ref 3.87–5.11)
RDW: 13.2 % (ref 11.5–15.5)
WBC: 4.7 10*3/uL (ref 4.0–10.5)
nRBC: 0 % (ref 0.0–0.2)

## 2021-08-16 LAB — COMPREHENSIVE METABOLIC PANEL WITH GFR
ALT: 12 U/L (ref 0–44)
AST: 24 U/L (ref 15–41)
Albumin: 3.3 g/dL — ABNORMAL LOW (ref 3.5–5.0)
Alkaline Phosphatase: 57 U/L (ref 38–126)
Anion gap: 8 (ref 5–15)
BUN: <5 mg/dL — ABNORMAL LOW (ref 6–20)
CO2: 25 mmol/L (ref 22–32)
Calcium: 8.8 mg/dL — ABNORMAL LOW (ref 8.9–10.3)
Chloride: 104 mmol/L (ref 98–111)
Creatinine, Ser: 0.77 mg/dL (ref 0.44–1.00)
GFR, Estimated: >60 mL/min
Glucose, Bld: 97 mg/dL (ref 70–99)
Potassium: 3.6 mmol/L (ref 3.5–5.1)
Sodium: 137 mmol/L (ref 135–145)
Total Bilirubin: 0.3 mg/dL (ref 0.3–1.2)
Total Protein: 5.6 g/dL — ABNORMAL LOW (ref 6.5–8.1)

## 2021-08-16 LAB — MAGNESIUM: Magnesium: 1.7 mg/dL (ref 1.7–2.4)

## 2021-08-16 MED ORDER — LEVOTHYROXINE SODIUM 50 MCG PO TABS
75.0000 ug | ORAL_TABLET | Freq: Every day | ORAL | 2 refills | Status: DC
Start: 2021-08-16 — End: 2022-05-17

## 2021-08-16 MED ORDER — CIPROFLOXACIN HCL 500 MG PO TABS: 500.0000 mg | ORAL_TABLET | Freq: Two times a day (BID) | ORAL | 0 refills | Status: AC

## 2021-08-16 MED ORDER — LEVETIRACETAM 500 MG PO TABS: 500.0000 mg | ORAL_TABLET | Freq: Two times a day (BID) | ORAL | 2 refills | Status: DC

## 2021-08-16 MED ORDER — ONDANSETRON HCL 4 MG PO TABS: 4.0000 mg | ORAL_TABLET | Freq: Every day | ORAL | 1 refills | Status: DC | PRN

## 2021-08-16 NOTE — Discharge Summary (Signed)
?Physician Discharge Summary  ?Crystal Spence EGB:[id number] DOB: 09-24-1975 DOA: 08/14/2021 ? ?PCP: Trey Sailors, PA ? ?Admit date: 08/14/2021 ?Discharge date: 08/16/2021 ? ?Admitted From: Home ?Disposition: Home ? ?Recommendations for Outpatient Follow-up:  ?Follow up with PCP in 1-2 weeks ?Continue antibiotics with ciprofloxacin 500 mg p.o. twice daily to complete 7-day course for UTI/pyelonephritis ?Continue to encourage EtOH cessation. ?Follow-up finalized urine culture that was pending at time of discharge. ? ?Home Health: No ?Equipment/Devices: None ? ?Discharge Condition: Stable ?CODE STATUS: Full code ?Diet recommendation: Heart healthy diet ? ?History of present illness: ? ?Crystal Spence is a 46 year old female with past medical history significant for essential hypertension, EtOH abuse, seizure disorder on Keppra, laryngeal cancer in remission who presented to [facility] ED on 4/4 with 3-day history of progressive nausea/vomiting/diarrhea and epigastric abdominal pain.  Patient also reports pain to her bilateral flanks with associated decreased oral intake.  Denies dysuria.  No other significant complaints at this time. ?  ?In the ED, temperature 98.8 ?F, HR 85, RR a 15, BP 178/123, SPO2 91% on room air.  Sodium 137, potassium 2.6, chloride 96, CO2 28, glucose 91, BUN 7, creatinine 0.84.  Lipase 49, AST 33, ALT 17, total bilirubin 0.8.  WBC 5.3, hemoglobin 15.2, platelets 310.  Urinalysis with small leukocytes, positive nitrite, 11-20 WBCs.  CT angiogram chest with no focal consolidation, no pulmonary embolism.  CT abdomen/pelvis without contrast with mild diffuse bladder wall thickening. ? ?Hospital course: ? ?Pyelonephritis ?Urinary tract infection ?Patient presenting to ED with 3-day history of progressive nausea/vomiting/diarrhea, and abdominal pain.  Patient is afebrile without leukocytosis. + CVA tenderness on physical exam.  hCG negative.  Urinalysis with small leukocytes, positive nitrite, 11-20  WBCs.  CT abdomen/pelvis with mild diffuse bladder wall thickening consistent with cystitis, cholelithiasis, otherwise unrevealing.  Chest x-ray with no acute cardiopulmonary disease process.  Lipase 49, within normal limits.  LFTs within normal limits.  Started on ceftriaxone 1 g IV every 24 hours.  Urine culture growing greater than 100 K GNR's.  Patient now able to tolerate oral intake and symptoms relatively resolved.  Will discharge on ciprofloxacin 500 mg p.o. twice daily based on previous urine cultures with E. coli/Klebsiella with good susceptibility to ciprofloxacin.  Recommend outpatient follow-up with PCP, will need follow-up on finalized urine culture. ?  ?Hypokalemia ?Repleted during hospitalization.  Potassium 3.6 at time of discharge. ? ?Hypertensive urgency ?Hx essential hypertension ?Continue Diltiazem CD 240 mg p.o. daily ?  ?Hx seizure disorder ?Keppra 500 mg p.o. twice daily, Topamax 50 mg p.o. twice daily ?  ?EtOH abuse ?Counseled on need for complete cessation. ? ?Hypothyroidism ?Levothyroxine 75 mcg p.o. daily ?  ?Hx Laryngeal cancer ?Previously followed with medical oncology outpatient, Dr. Alvy Bimler; given cancer free for many years she was discharged from the oncology clinic back in 2020. ?  ?Severe protein calorie malnutrition ?Body mass index is 17.01 kg/m?Marland Kitchen  Continue to encourage increased oral intake. ? ? ? ? ?Discharge Diagnoses:  ?Principal Problem: ?  Nausea and vomiting ?Active Problems: ?  Alcohol abuse ?  Hypokalemia ?  Pyelonephritis ?  Seizure-like activity ([place]) ? ? ? ?Discharge Instructions ? ?Discharge Instructions   ? ? Call MD for:  difficulty breathing, headache or visual disturbances   Complete by: As directed ?  ? Call MD for:  extreme fatigue   Complete by: As directed ?  ? Call MD for:  persistant dizziness or light-headedness   Complete by: As directed ?  ?  Call MD for:  persistant nausea and vomiting   Complete by: As directed ?  ? Call MD for:  severe uncontrolled  pain   Complete by: As directed ?  ? Call MD for:  temperature >100.4   Complete by: As directed ?  ? Diet - low sodium heart healthy   Complete by: As directed ?  ? Increase activity slowly   Complete by: As directed ?  ? ?  ? ?Allergies as of 08/16/2021   ?No Known Allergies ?  ? ?  ?Medication List  ?  ? ?STOP taking these medications   ? ?albuterol 108 (90 Base) MCG/ACT inhaler ?Commonly known as: VENTOLIN HFA ?  ?betamethasone dipropionate 0.05 % cream ?  ?cetirizine 10 MG tablet ?Commonly known as: ZYRTEC ?  ?clonazePAM 1 MG tablet ?Commonly known as: KLONOPIN ?  ?clotrimazole 1 % cream ?Commonly known as: LOTRIMIN ?  ?folic acid 1 MG tablet ?Commonly known as: FOLVITE ?  ?multivitamin with minerals Tabs tablet ?  ?ondansetron 4 MG disintegrating tablet ?Commonly known as: Zofran ODT ?  ?pantoprazole 40 MG tablet ?Commonly known as: PROTONIX ?  ?thiamine 100 MG tablet ?  ? ?  ? ?TAKE these medications   ? ?acetaminophen 500 MG tablet ?Commonly known as: TYLENOL ?Take 500-1,000 mg by mouth every 6 (six) hours as needed for mild pain or headache. ?  ?bisacodyl 5 MG EC tablet ?Generic drug: bisacodyl ?Take 5 mg by mouth daily as needed for mild constipation or moderate constipation. ?  ?ciprofloxacin 500 MG tablet ?Commonly known as: Cipro ?Take 1 tablet (500 mg total) by mouth 2 (two) times daily for 6 days. ?  ?diltiazem 240 MG 24 hr capsule ?Commonly known as: CARDIZEM CD ?Take 1 capsule (240 mg total) by mouth daily. ?  ?DULoxetine 30 MG capsule ?Commonly known as: CYMBALTA ?Take 1 capsule (30 mg total) by mouth 2 (two) times daily. ?What changed: when to take this ?  ?hydrOXYzine 50 MG capsule ?Commonly known as: VISTARIL ?Take 1 capsule (50 mg total) by mouth at bedtime. ?  ?levETIRAcetam 500 MG tablet ?Commonly known as: Keppra ?Take 1 tablet (500 mg total) by mouth 2 (two) times daily. ?  ?levothyroxine 50 MCG tablet ?Commonly known as: SYNTHROID ?Take 1.5 tablets (75 mcg total) by mouth daily before  breakfast. ?  ?omeprazole 20 MG capsule ?Commonly known as: PRILOSEC ?Take 20 mg by mouth daily. ?  ?ondansetron 4 MG tablet ?Commonly known as: Zofran ?Take 1 tablet (4 mg total) by mouth daily as needed for nausea or vomiting. ?  ?topiramate 50 MG tablet ?Commonly known as: TOPAMAX ?Take 2 tablets (100 mg total) by mouth 2 (two) times daily. ?  ?traZODone 100 MG tablet ?Commonly known as: DESYREL ?Take 1 tablet (100 mg total) by mouth at bedtime. ?  ? ?  ? ? Follow-up Information   ? ? Trey Sailors, [place]. Schedule an appointment as soon as possible for a visit in 1 week(s).   ?Specialty: Physician Assistant ?Contact information: ?[place] ?[place] [phone] ?[phone] ? ? ?  ?  ? ?  ?  ? ?  ? ?No Known Allergies ? ?Consultations: ?None ? ? ?Procedures/Studies: ?DG Chest 2 View ? ?Result Date: 08/14/2021 ?CLINICAL DATA:  Shortness of breath. EXAM: CHEST - 2 VIEW COMPARISON:  Chest x-ray dated March 13, 2019. FINDINGS: The heart size and mediastinal contours are within normal limits. Normal pulmonary vascularity. No focal consolidation, pleural effusion, or pneumothorax. Scattered  calcified granulomas again noted. No acute osseous abnormality. Unchanged thoracic scoliosis. IMPRESSION: 1. No acute cardiopulmonary disease. Electronically Signed   By: Titus Dubin M.D.   On: 08/14/2021 15:50  ? ?CT Angio Chest PE W and/or Wo Contrast ? ?Result Date: 08/14/2021 ?CLINICAL DATA:  Pulmonary embolism (PE) suspected, high prob. Dyspnea. Throat cancer. EXAM: CT ANGIOGRAPHY CHEST WITH CONTRAST TECHNIQUE: Multidetector CT imaging of the chest was performed using the standard protocol during bolus administration of intravenous contrast. Multiplanar CT image reconstructions and MIPs were obtained to evaluate the vascular anatomy. RADIATION DOSE REDUCTION: This exam was performed according to the departmental dose-optimization program which includes automated exposure control, adjustment of the mA and/or kV  according to patient size and/or use of iterative reconstruction technique. CONTRAST:  74m OMNIPAQUE IOHEXOL 350 MG/ML SOLN COMPARISON:  03/17/2016 FINDINGS: Cardiovascular: Adequate opacification of the pulmona

## 2021-08-16 NOTE — TOC Transition Note (Signed)
Transition of Care (TOC) - CM/SW Discharge Note ? ?Patient Details  ?Name: JET TRAYNHAM ?MRN: [id number] ?Date of Birth: 01/21/1976 ? ?Transition of Care (TOC) CM/SW Contact:  ?Sherie Don, LCSW ?Phone Number: ?08/16/2021, 10:45 AM ? ?Clinical Narrative: TOC consulted for ETOH use resources. Resources added to AVS. TOC signing off. ? ?Final next level of care: Home/Self Care ?Barriers to Discharge: Barriers Resolved ? ?Discharge Plan and Services        ?DME Arranged: N/A ?DME Agency: NA ? ?Readmission Risk Interventions ?   ? View : No data to display.  ?  ?  ?  ? ?

## 2021-08-16 NOTE — Progress Notes (Signed)
Assessment unchanged. Verbalized understanding of dc instructions as well as medications and follow up care through teach back. Discharged via wc to front entrance accompanied by NT. ?

## 2021-08-17 LAB — URINE CULTURE: Culture: >=100000 — AB

## 2021-12-30 ENCOUNTER — Emergency Department (HOSPITAL_COMMUNITY): Payer: Medicaid Other

## 2021-12-30 ENCOUNTER — Emergency Department (HOSPITAL_COMMUNITY)
Admission: EM | Admit: 2021-12-30 | Discharge: 2021-12-31 | Disposition: A | Discharge status: Home / Self Care | Payer: Medicaid Other | Attending: Emergency Medicine | Admitting: Emergency Medicine

## 2021-12-30 ENCOUNTER — Other Ambulatory Visit: Payer: Self-pay

## 2021-12-30 DIAGNOSIS — R109 Unspecified abdominal pain: Secondary | ICD-10-CM

## 2021-12-30 DIAGNOSIS — Y907 Blood alcohol level of 200-239 mg/100 ml: Secondary | ICD-10-CM | POA: Diagnosis not present

## 2021-12-30 DIAGNOSIS — I10 Essential (primary) hypertension: Secondary | ICD-10-CM | POA: Insufficient documentation

## 2021-12-30 DIAGNOSIS — Z8521 Personal history of malignant neoplasm of larynx: Secondary | ICD-10-CM | POA: Diagnosis not present

## 2021-12-30 DIAGNOSIS — E876 Hypokalemia: Secondary | ICD-10-CM | POA: Insufficient documentation

## 2021-12-30 DIAGNOSIS — Z79899 Other long term (current) drug therapy: Secondary | ICD-10-CM | POA: Insufficient documentation

## 2021-12-30 DIAGNOSIS — K029 Dental caries, unspecified: Secondary | ICD-10-CM | POA: Insufficient documentation

## 2021-12-30 DIAGNOSIS — K279 Peptic ulcer, site unspecified, unspecified as acute or chronic, without hemorrhage or perforation: Secondary | ICD-10-CM | POA: Diagnosis not present

## 2021-12-30 LAB — CBC WITH DIFFERENTIAL/PLATELET
Abs Immature Granulocytes: 0.01 10*3/uL (ref 0.00–0.07)
Basophils Absolute: 0.1 10*3/uL (ref 0.0–0.1)
Basophils Relative: 1 %
Eosinophils Absolute: 0 10*3/uL (ref 0.0–0.5)
Eosinophils Relative: 0 %
HCT: 41.7 % (ref 36.0–46.0)
Hemoglobin: 13.8 g/dL (ref 12.0–15.0)
Immature Granulocytes: 0 %
Lymphocytes Relative: 10 %
Lymphs Abs: 0.8 10*3/uL (ref 0.7–4.0)
MCH: 30.6 pg (ref 26.0–34.0)
MCHC: 33.1 g/dL (ref 30.0–36.0)
MCV: 92.5 fL (ref 80.0–100.0)
Monocytes Absolute: 0.9 10*3/uL (ref 0.1–1.0)
Monocytes Relative: 12 %
Neutro Abs: 6.1 10*3/uL (ref 1.7–7.7)
Neutrophils Relative %: 77 %
Platelets: 357 10*3/uL (ref 150–400)
RBC: 4.51 MIL/uL (ref 3.87–5.11)
RDW: 16.8 % — ABNORMAL HIGH (ref 11.5–15.5)
WBC: 7.9 10*3/uL (ref 4.0–10.5)
nRBC: 0 % (ref 0.0–0.2)

## 2021-12-30 LAB — COMPREHENSIVE METABOLIC PANEL
ALT: 17 U/L (ref 0–44)
AST: 37 U/L (ref 15–41)
Albumin: 4.1 g/dL (ref 3.5–5.0)
Alkaline Phosphatase: 71 U/L (ref 38–126)
Anion gap: 13 (ref 5–15)
BUN: 9 mg/dL (ref 6–20)
CO2: 27 mmol/L (ref 22–32)
Calcium: 9.6 mg/dL (ref 8.9–10.3)
Chloride: 101 mmol/L (ref 98–111)
Creatinine, Ser: 0.81 mg/dL (ref 0.44–1.00)
GFR, Estimated: >60 mL/min (ref >60)
Glucose, Bld: 86 mg/dL (ref 70–99)
Potassium: 2.9 mmol/L — ABNORMAL LOW (ref 3.5–5.1)
Sodium: 141 mmol/L (ref 135–145)
Total Bilirubin: 0.6 mg/dL (ref 0.3–1.2)
Total Protein: 6.7 g/dL (ref 6.5–8.1)

## 2021-12-30 LAB — ACETAMINOPHEN LEVEL: Acetaminophen (Tylenol), Serum: <10 ug/mL — ABNORMAL LOW (ref 10–30)

## 2021-12-30 LAB — PROTIME-INR
INR: 0.9 (ref 0.8–1.2)
Prothrombin Time: 12.3 seconds (ref 11.4–15.2)

## 2021-12-30 LAB — SALICYLATE LEVEL: Salicylate Lvl: <7 mg/dL — ABNORMAL LOW (ref 7.0–30.0)

## 2021-12-30 LAB — ETHANOL: Alcohol, Ethyl (B): 238 mg/dL — ABNORMAL HIGH (ref <10)

## 2021-12-30 LAB — LIPASE, BLOOD: Lipase: 48 U/L (ref 11–51)

## 2021-12-30 LAB — APTT: aPTT: 31 seconds (ref 24–36)

## 2021-12-30 MED ORDER — SODIUM CHLORIDE (PF) 0.9 % IJ SOLN
INTRAMUSCULAR | Status: AC
  Filled 2021-12-30: qty 50

## 2021-12-30 NOTE — ED Provider Notes (Signed)
[facility] DEPT Provider Note   CSN: [id number] Arrival date & time: 12/30/21  2157     History {Add pertinent medical, surgical, social history, OB history to HPI:1} Chief Complaint  Patient presents with   Abdominal Pain    Crystal Spence is a 46 y.o. female.  The history is provided by the patient and medical records.  Abdominal Pain  46 year old female with history of hypertension, anemia, laryngeal cancer, presenting to the ED with atraumatic bruising.  States noticed it over the past few weeks-- initially started on left flank, now more concentrated on right flank.  She adamantly denies any falls or trauma.  She denies any nausea or vomiting but states appetite has been poor.  She denies any fevers.  She does have history of laryngeal cancer, is concerned for possibly some new type of cancer causing this.  She is not on any type of anticoagulation.  Home Medications Prior to Admission medications   Medication Sig Start Date End Date Taking? Authorizing Provider  acetaminophen (TYLENOL) 500 MG tablet Take 500-1,000 mg by mouth every 6 (six) hours as needed for mild pain or headache.    [provider]  bisacodyl 5 MG EC tablet Take 5 mg by mouth daily as needed for mild constipation or moderate constipation.    [provider]  diltiazem (CARDIZEM CD) 240 MG 24 hr capsule Take 1 capsule (240 mg total) by mouth daily. 03/14/19   Jean Rosenthal, MD  DULoxetine (CYMBALTA) 30 MG capsule Take 1 capsule (30 mg total) by mouth 2 (two) times daily. Patient taking differently: Take 30 mg by mouth daily. 03/14/19   Jean Rosenthal, MD  hydrOXYzine (VISTARIL) 50 MG capsule Take 1 capsule (50 mg total) by mouth at bedtime. 03/14/19   Jean Rosenthal, MD  levETIRAcetam (KEPPRA) 500 MG tablet Take 1 tablet (500 mg total) by mouth 2 (two) times daily. 08/16/21 11/14/21  [place] Donnamarie Poag, DO  levothyroxine (SYNTHROID) 50 MCG tablet Take 1.5 tablets (75 mcg  total) by mouth daily before breakfast. 08/16/21 11/14/21  [place] Donnamarie Poag, DO  omeprazole (PRILOSEC) 20 MG capsule Take 20 mg by mouth daily. 01/05/19   [provider]  ondansetron (ZOFRAN) 4 MG tablet Take 1 tablet (4 mg total) by mouth daily as needed for nausea or vomiting. 08/16/21 08/16/22  [place] Donnamarie Poag, DO  topiramate (TOPAMAX) 50 MG tablet Take 2 tablets (100 mg total) by mouth 2 (two) times daily. Patient not taking: Reported on 08/14/2021 03/14/19   Jean Rosenthal, MD  traZODone (DESYREL) 100 MG tablet Take 1 tablet (100 mg total) by mouth at bedtime. 03/14/19   Jean Rosenthal, MD      Allergies    Patient has no known allergies.    Review of Systems   Review of Systems  Gastrointestinal:  Positive for abdominal pain.  All other systems reviewed and are negative.   Physical Exam Updated Vital Signs BP (!) 159/97 (BP Location: Right Arm)   Pulse (!) 101   Temp 98.7 F (37.1 C) (Oral)   Resp 18   Ht '5\' 1"'$  (1.549 m)   Wt 40.8 kg   LMP 10/20/2012 Comment: pt. has had tubal ligation,no longer has peroids  SpO2 92%   BMI 17.01 kg/m   Physical Exam Vitals and nursing note reviewed.  Constitutional:      Appearance: She is well-developed.  HENT:     Head: Normocephalic and atraumatic.  Eyes:  Conjunctiva/sclera: Conjunctivae normal.     Pupils: Pupils are equal, round, and reactive to light.  Cardiovascular:     Rate and Rhythm: Normal rate and regular rhythm.     Heart sounds: Normal heart sounds.  Pulmonary:     Effort: Pulmonary effort is normal.     Breath sounds: Normal breath sounds.  Abdominal:     General: Bowel sounds are normal.     Palpations: Abdomen is soft.     Comments: Bruising noted to right lower abdomen and right flank, tender to touch  Musculoskeletal:        General: Normal range of motion.     Cervical back: Normal range of motion.  Skin:    General: Skin is warm and dry.  Neurological:     Mental Status: She is alert and oriented to  person, place, and time.     ED Results / Procedures / Treatments   Labs (all labs ordered are listed, but only abnormal results are displayed) Labs Reviewed  CBC WITH DIFFERENTIAL/PLATELET  COMPREHENSIVE METABOLIC PANEL  LIPASE, BLOOD  PROTIME-INR  APTT  SALICYLATE LEVEL  ACETAMINOPHEN LEVEL  ETHANOL    EKG None  Radiology No results found.  Procedures Procedures  {Document cardiac monitor, telemetry assessment procedure when appropriate:1}  Medications Ordered in ED Medications - No data to display  ED Course/ Medical Decision Making/ A&P                           Medical Decision Making Amount and/or Complexity of Data Reviewed Labs: ordered. Radiology: ordered.   ***  {Document critical care time when appropriate:1} {Document review of labs and clinical decision tools ie heart score, Chads2Vasc2 etc:1}  {Document your independent review of radiology images, and any outside records:1} {Document your discussion with family members, caretakers, and with consultants:1} {Document social determinants of health affecting pt's care:1} {Document your decision making why or why not admission, treatments were needed:1} Final Clinical Impression(s) / ED Diagnoses Final diagnoses:  None    Rx / DC Orders ED Discharge Orders     None

## 2021-12-30 NOTE — ED Triage Notes (Signed)
Patient coming to ED for evaluation of upper abdominal pain.  Reports pain is severe.  No diarrhea or vomiting.  Has intermittent nausea.  States she noticed bruising to her R side "a while ago" and abdominal pain started after bruising developed.  Bruise present to R hip and R flank.  No reports of injury or recent falls.  States "I don't know where it came from."

## 2021-12-31 ENCOUNTER — Emergency Department (HOSPITAL_COMMUNITY): Payer: Medicaid Other

## 2021-12-31 MED ORDER — SUCRALFATE 1 G PO TABS
1.0000 g | ORAL_TABLET | Freq: Once | ORAL | Status: AC
  Administered 2021-12-31: 1 g via ORAL
  Filled 2021-12-31: qty 1

## 2021-12-31 MED ORDER — FAMOTIDINE 20 MG PO TABS
20.0000 mg | ORAL_TABLET | Freq: Once | ORAL | Status: AC
  Administered 2021-12-31: 20 mg via ORAL
  Filled 2021-12-31: qty 1

## 2021-12-31 MED ORDER — SUCRALFATE 1 G PO TABS: 1.0000 g | ORAL_TABLET | Freq: Three times a day (TID) | ORAL | 0 refills | Status: DC

## 2021-12-31 MED ORDER — IOHEXOL 300 MG/ML  SOLN
80.0000 mL | Freq: Once | INTRAMUSCULAR | Status: AC | PRN
  Administered 2021-12-31: 80 mL via INTRAVENOUS

## 2021-12-31 MED ORDER — FAMOTIDINE 20 MG PO TABS: 20.0000 mg | ORAL_TABLET | Freq: Every day | ORAL | 0 refills | Status: DC

## 2021-12-31 MED ORDER — POTASSIUM CHLORIDE CRYS ER 20 MEQ PO TBCR
40.0000 meq | EXTENDED_RELEASE_TABLET | Freq: Once | ORAL | Status: AC
  Administered 2021-12-31: 40 meq via ORAL
  Filled 2021-12-31: qty 2

## 2021-12-31 NOTE — Discharge Instructions (Signed)
Take the prescribed medication as directed. Recommend that you limit your alcohol use, stop taking NSAIDs such as Motrin, Aleve, ibuprofen, Goody powders.  This will worsen your peptic ulcer disease. The cyst on your right leg will need to be followed up with a ultrasound.  Your primary care doctor can schedule this for you. Follow-up with your primary care doctor.   Return to the ED for new or worsening symptoms.

## 2022-04-29 ENCOUNTER — Emergency Department (HOSPITAL_COMMUNITY)
Admission: EM | Admit: 2022-04-29 | Discharge: 2022-04-30 | Disposition: A | Discharge status: Home / Self Care | Payer: Medicaid Other | Source: Home / Self Care | Attending: Emergency Medicine | Admitting: Emergency Medicine

## 2022-04-29 ENCOUNTER — Emergency Department (HOSPITAL_COMMUNITY): Payer: Medicaid Other

## 2022-04-29 ENCOUNTER — Other Ambulatory Visit: Payer: Self-pay

## 2022-04-29 ENCOUNTER — Encounter (HOSPITAL_COMMUNITY): Payer: Self-pay

## 2022-04-29 DIAGNOSIS — J101 Influenza due to other identified influenza virus with other respiratory manifestations: Secondary | ICD-10-CM | POA: Insufficient documentation

## 2022-04-29 DIAGNOSIS — Z1152 Encounter for screening for COVID-19: Secondary | ICD-10-CM | POA: Insufficient documentation

## 2022-04-29 DIAGNOSIS — J111 Influenza due to unidentified influenza virus with other respiratory manifestations: Secondary | ICD-10-CM

## 2022-04-29 DIAGNOSIS — I10 Essential (primary) hypertension: Secondary | ICD-10-CM | POA: Insufficient documentation

## 2022-04-29 LAB — RESP PANEL BY RT-PCR (RSV, FLU A&B, COVID)  RVPGX2
Influenza A by PCR: POSITIVE — AB
Influenza B by PCR: NEGATIVE
Resp Syncytial Virus by PCR: NEGATIVE
SARS Coronavirus 2 by RT PCR: NEGATIVE

## 2022-04-29 NOTE — ED Triage Notes (Addendum)
Pt BIB EMS with reports of a cough and green sputum for "a while". Pt is also non compliant with her seizure and htn medication.   20 g left forearm 10 albuterol 1.0 atrovent  125 solumedrol

## 2022-04-30 ENCOUNTER — Emergency Department (HOSPITAL_BASED_OUTPATIENT_CLINIC_OR_DEPARTMENT_OTHER): Payer: Medicaid Other

## 2022-04-30 ENCOUNTER — Emergency Department (HOSPITAL_COMMUNITY)
Admission: EM | Admit: 2022-04-30 | Discharge: 2022-04-30 | Disposition: A | Discharge status: Home / Self Care | Payer: Medicaid Other | Source: Home / Self Care | Attending: Emergency Medicine | Admitting: Emergency Medicine

## 2022-04-30 ENCOUNTER — Emergency Department (HOSPITAL_COMMUNITY)
Admission: EM | Admit: 2022-04-30 | Discharge: 2022-04-30 | Discharge status: Against Medical Advice | Payer: Medicaid Other | Source: Home / Self Care

## 2022-04-30 ENCOUNTER — Inpatient Hospital Stay (HOSPITAL_BASED_OUTPATIENT_CLINIC_OR_DEPARTMENT_OTHER)
Admission: EM | Admit: 2022-04-30 | Discharge: 2022-05-17 | DRG: 100 | Disposition: A | Discharge status: Skilled Nursing Facility | Payer: Medicaid Other | Attending: Internal Medicine | Admitting: Internal Medicine

## 2022-04-30 ENCOUNTER — Encounter (HOSPITAL_COMMUNITY): Payer: Self-pay | Admitting: *Deleted

## 2022-04-30 ENCOUNTER — Encounter (HOSPITAL_BASED_OUTPATIENT_CLINIC_OR_DEPARTMENT_OTHER): Payer: Self-pay

## 2022-04-30 ENCOUNTER — Other Ambulatory Visit: Payer: Self-pay

## 2022-04-30 DIAGNOSIS — J101 Influenza due to other identified influenza virus with other respiratory manifestations: Secondary | ICD-10-CM

## 2022-04-30 DIAGNOSIS — I160 Hypertensive urgency: Secondary | ICD-10-CM | POA: Diagnosis present

## 2022-04-30 DIAGNOSIS — Z8521 Personal history of malignant neoplasm of larynx: Secondary | ICD-10-CM

## 2022-04-30 DIAGNOSIS — J9601 Acute respiratory failure with hypoxia: Secondary | ICD-10-CM | POA: Diagnosis not present

## 2022-04-30 DIAGNOSIS — Z1152 Encounter for screening for COVID-19: Secondary | ICD-10-CM

## 2022-04-30 DIAGNOSIS — T426X6A Underdosing of other antiepileptic and sedative-hypnotic drugs, initial encounter: Secondary | ICD-10-CM | POA: Diagnosis present

## 2022-04-30 DIAGNOSIS — E039 Hypothyroidism, unspecified: Secondary | ICD-10-CM | POA: Diagnosis present

## 2022-04-30 DIAGNOSIS — F1090 Alcohol use, unspecified, uncomplicated: Secondary | ICD-10-CM | POA: Diagnosis present

## 2022-04-30 DIAGNOSIS — Z781 Physical restraint status: Secondary | ICD-10-CM

## 2022-04-30 DIAGNOSIS — G8191 Hemiplegia, unspecified affecting right dominant side: Secondary | ICD-10-CM | POA: Diagnosis present

## 2022-04-30 DIAGNOSIS — Z79891 Long term (current) use of opiate analgesic: Secondary | ICD-10-CM

## 2022-04-30 DIAGNOSIS — R443 Hallucinations, unspecified: Secondary | ICD-10-CM | POA: Diagnosis not present

## 2022-04-30 DIAGNOSIS — Z681 Body mass index (BMI) 19 or less, adult: Secondary | ICD-10-CM | POA: Diagnosis not present

## 2022-04-30 DIAGNOSIS — I10 Essential (primary) hypertension: Secondary | ICD-10-CM | POA: Diagnosis present

## 2022-04-30 DIAGNOSIS — R1312 Dysphagia, oropharyngeal phase: Secondary | ICD-10-CM | POA: Diagnosis present

## 2022-04-30 DIAGNOSIS — R262 Difficulty in walking, not elsewhere classified: Secondary | ICD-10-CM | POA: Diagnosis present

## 2022-04-30 DIAGNOSIS — Z923 Personal history of irradiation: Secondary | ICD-10-CM

## 2022-04-30 DIAGNOSIS — R918 Other nonspecific abnormal finding of lung field: Secondary | ICD-10-CM | POA: Diagnosis present

## 2022-04-30 DIAGNOSIS — G40901 Epilepsy, unspecified, not intractable, with status epilepticus: Principal | ICD-10-CM

## 2022-04-30 DIAGNOSIS — R569 Unspecified convulsions: Secondary | ICD-10-CM | POA: Insufficient documentation

## 2022-04-30 DIAGNOSIS — Z79899 Other long term (current) drug therapy: Secondary | ICD-10-CM

## 2022-04-30 DIAGNOSIS — E876 Hypokalemia: Secondary | ICD-10-CM

## 2022-04-30 DIAGNOSIS — R4701 Aphasia: Secondary | ICD-10-CM | POA: Diagnosis not present

## 2022-04-30 DIAGNOSIS — D649 Anemia, unspecified: Secondary | ICD-10-CM | POA: Diagnosis not present

## 2022-04-30 DIAGNOSIS — Z5321 Procedure and treatment not carried out due to patient leaving prior to being seen by health care provider: Secondary | ICD-10-CM | POA: Insufficient documentation

## 2022-04-30 DIAGNOSIS — J690 Pneumonitis due to inhalation of food and vomit: Secondary | ICD-10-CM | POA: Diagnosis not present

## 2022-04-30 DIAGNOSIS — E871 Hypo-osmolality and hyponatremia: Secondary | ICD-10-CM | POA: Diagnosis not present

## 2022-04-30 DIAGNOSIS — G9341 Metabolic encephalopathy: Secondary | ICD-10-CM | POA: Diagnosis present

## 2022-04-30 DIAGNOSIS — J111 Influenza due to unidentified influenza virus with other respiratory manifestations: Secondary | ICD-10-CM | POA: Insufficient documentation

## 2022-04-30 DIAGNOSIS — E43 Unspecified severe protein-calorie malnutrition: Secondary | ICD-10-CM | POA: Diagnosis not present

## 2022-04-30 DIAGNOSIS — Z87891 Personal history of nicotine dependence: Secondary | ICD-10-CM

## 2022-04-30 DIAGNOSIS — Z79890 Hormone replacement therapy (postmenopausal): Secondary | ICD-10-CM

## 2022-04-30 LAB — BASIC METABOLIC PANEL
Anion gap: 11 (ref 5–15)
BUN: 7 mg/dL (ref 6–20)
CO2: 28 mmol/L (ref 22–32)
Calcium: 8.8 mg/dL — ABNORMAL LOW (ref 8.9–10.3)
Chloride: 95 mmol/L — ABNORMAL LOW (ref 98–111)
Creatinine, Ser: 0.73 mg/dL (ref 0.44–1.00)
GFR, Estimated: >60 mL/min (ref >60)
Glucose, Bld: 151 mg/dL — ABNORMAL HIGH (ref 70–99)
Potassium: 2.6 mmol/L — CL (ref 3.5–5.1)
Sodium: 134 mmol/L — ABNORMAL LOW (ref 135–145)

## 2022-04-30 LAB — I-STAT CHEM 8, ED
BUN: 4 mg/dL — ABNORMAL LOW (ref 6–20)
Calcium, Ion: 0.73 mmol/L — CL (ref 1.15–1.40)
Chloride: 104 mmol/L (ref 98–111)
Creatinine, Ser: 0.5 mg/dL (ref 0.44–1.00)
Glucose, Bld: 86 mg/dL (ref 70–99)
HCT: 38 % (ref 36.0–46.0)
Hemoglobin: 12.9 g/dL (ref 12.0–15.0)
Potassium: 3.7 mmol/L (ref 3.5–5.1)
Sodium: 135 mmol/L (ref 135–145)
TCO2: 24 mmol/L (ref 22–32)

## 2022-04-30 LAB — CBC WITH DIFFERENTIAL/PLATELET
Abs Immature Granulocytes: 0.03 10*3/uL (ref 0.00–0.07)
Basophils Absolute: 0 10*3/uL (ref 0.0–0.1)
Basophils Relative: 0 %
Eosinophils Absolute: 0 10*3/uL (ref 0.0–0.5)
Eosinophils Relative: 0 %
HCT: 39.7 % (ref 36.0–46.0)
Hemoglobin: 13.5 g/dL (ref 12.0–15.0)
Immature Granulocytes: 0 %
Lymphocytes Relative: 3 %
Lymphs Abs: 0.3 10*3/uL — ABNORMAL LOW (ref 0.7–4.0)
MCH: 32.1 pg (ref 26.0–34.0)
MCHC: 34 g/dL (ref 30.0–36.0)
MCV: 94.5 fL (ref 80.0–100.0)
Monocytes Absolute: 0.2 10*3/uL (ref 0.1–1.0)
Monocytes Relative: 2 %
Neutro Abs: 7.7 10*3/uL (ref 1.7–7.7)
Neutrophils Relative %: 95 %
Platelets: 367 10*3/uL (ref 150–400)
RBC: 4.2 MIL/uL (ref 3.87–5.11)
RDW: 15.6 % — ABNORMAL HIGH (ref 11.5–15.5)
WBC: 8.2 10*3/uL (ref 4.0–10.5)
nRBC: 0 % (ref 0.0–0.2)

## 2022-04-30 LAB — HCG, SERUM, QUALITATIVE: Preg, Serum: NEGATIVE

## 2022-04-30 LAB — ETHANOL: Alcohol, Ethyl (B): <10 mg/dL (ref <10)

## 2022-04-30 MED ORDER — LEVETIRACETAM IN NACL 1000 MG/100ML IV SOLN
1000.0000 mg | Freq: Once | INTRAVENOUS | Status: AC
  Administered 2022-04-30: 1000 mg via INTRAVENOUS
  Filled 2022-04-30: qty 100

## 2022-04-30 MED ORDER — POTASSIUM CHLORIDE CRYS ER 20 MEQ PO TBCR: 40.0000 meq | EXTENDED_RELEASE_TABLET | Freq: Once | ORAL | Status: DC

## 2022-04-30 MED ORDER — OSELTAMIVIR PHOSPHATE 75 MG PO CAPS: 75.0000 mg | ORAL_CAPSULE | Freq: Two times a day (BID) | ORAL | 0 refills | Status: DC

## 2022-04-30 MED ORDER — LEVETIRACETAM IN NACL 1500 MG/100ML IV SOLN: 1500.0000 mg | Freq: Once | INTRAVENOUS | Status: DC

## 2022-04-30 MED ORDER — POTASSIUM CHLORIDE CRYS ER 20 MEQ PO TBCR
40.0000 meq | EXTENDED_RELEASE_TABLET | Freq: Once | ORAL | Status: DC
  Filled 2022-04-30: qty 2

## 2022-04-30 MED ORDER — LORAZEPAM 2 MG/ML IJ SOLN: 2.0000 mg | Freq: Once | INTRAMUSCULAR | Status: AC

## 2022-04-30 MED ORDER — DILTIAZEM HCL ER COATED BEADS 240 MG PO CP24: 240.0000 mg | ORAL_CAPSULE | Freq: Every day | ORAL | 3 refills | Status: DC

## 2022-04-30 MED ORDER — ALBUTEROL SULFATE HFA 108 (90 BASE) MCG/ACT IN AERS
2.0000 | INHALATION_SPRAY | Freq: Once | RESPIRATORY_TRACT | Status: AC
  Administered 2022-04-30: 2 via RESPIRATORY_TRACT
  Filled 2022-04-30: qty 6.7

## 2022-04-30 MED ORDER — LACTATED RINGERS IV BOLUS
1000.0000 mL | Freq: Once | INTRAVENOUS | Status: AC
  Administered 2022-04-30: 1000 mL via INTRAVENOUS

## 2022-04-30 MED ORDER — LORAZEPAM 2 MG/ML IJ SOLN
INTRAMUSCULAR | Status: AC
  Administered 2022-04-30: 2 mg via INTRAVENOUS
  Filled 2022-04-30: qty 1

## 2022-04-30 MED ORDER — POTASSIUM CHLORIDE 10 MEQ/100ML IV SOLN
10.0000 meq | Freq: Once | INTRAVENOUS | Status: DC
Start: 2022-04-30 — End: 2022-04-30

## 2022-04-30 MED ORDER — LEVETIRACETAM 500 MG PO TABS: 500.0000 mg | ORAL_TABLET | Freq: Two times a day (BID) | ORAL | 2 refills | Status: DC

## 2022-04-30 MED ORDER — LORAZEPAM 2 MG/ML IJ SOLN
1.0000 mg | Freq: Once | INTRAMUSCULAR | Status: AC
  Filled 2022-04-30: qty 1

## 2022-04-30 MED ORDER — MAGNESIUM SULFATE 50 % IJ SOLN
2.0000 g | Freq: Once | INTRAMUSCULAR | Status: AC
  Administered 2022-04-30: 2 g via INTRAVENOUS
  Filled 2022-04-30: qty 4

## 2022-04-30 MED ORDER — LEVETIRACETAM IN NACL 500 MG/100ML IV SOLN
500.0000 mg | Freq: Once | INTRAVENOUS | Status: AC
  Administered 2022-04-30: 500 mg via INTRAVENOUS
  Filled 2022-04-30: qty 100

## 2022-04-30 MED ORDER — SODIUM CHLORIDE 0.9 % IV BOLUS: 500.0000 mL | Freq: Once | INTRAVENOUS | Status: DC

## 2022-04-30 MED ORDER — LORAZEPAM 2 MG/ML IJ SOLN
INTRAMUSCULAR | Status: AC
  Administered 2022-05-01: 1 mg via INTRAVENOUS
  Filled 2022-04-30: qty 1

## 2022-04-30 MED ORDER — LORAZEPAM 2 MG/ML IJ SOLN
INTRAMUSCULAR | Status: AC
  Filled 2022-04-30: qty 1

## 2022-04-30 MED ORDER — IBUPROFEN 800 MG PO TABS
800.0000 mg | ORAL_TABLET | Freq: Once | ORAL | Status: AC
  Administered 2022-04-30: 800 mg via ORAL
  Filled 2022-04-30: qty 1

## 2022-04-30 NOTE — ED Provider Notes (Signed)
[place] EMERGENCY DEPT Provider Note   CSN: [id number] Arrival date & time: 04/30/22  2017     History  Chief Complaint  Patient presents with   Seizures    Crystal Spence is a 46 y.o. female.  Patient is a 46 year old female with a past medical history of seizure disorder, hypertension, prior throat cancer presenting to the emergency department for seizure.  Patient is here with her family who states that she was seen in the ER and discharged early this morning after a flulike illness.  Her daughter states that she reported that she felt generally unwell the last couple of days.  She states that she has not been taking her blood pressure medication or her seizure medication for the last couple of days due to running out of her medicines.  She states that she received a call today that the patient was at a bus stop when a bystander saw that she had a seizure.  They state that they initially returned to the [facility] long ED but left prior to being seen and brought her to the [place] ED.  They state that she had an additional seizure on the way to the hospital.  They state that she did not return to her neurologic baseline between these seizures.  Further him history is limited due to the patient's postictal state.  The history is provided by a relative. The history is limited by the condition of the patient (Postictal).  Seizures      Home Medications Prior to Admission medications   Medication Sig Start Date End Date Taking? Authorizing Provider  acetaminophen (TYLENOL) 500 MG tablet Take 500-1,000 mg by mouth every 6 (six) hours as needed for mild pain or headache.    [provider]  bisacodyl 5 MG EC tablet Take 5 mg by mouth daily as needed for mild constipation or moderate constipation.    [provider]  diltiazem (CARDIZEM CD) 240 MG 24 hr capsule Take 1 capsule (240 mg total) by mouth daily. 04/30/22   Redwine, Madison A, PA-C   DULoxetine (CYMBALTA) 30 MG capsule Take 1 capsule (30 mg total) by mouth 2 (two) times daily. Patient taking differently: Take 30 mg by mouth daily. 03/14/19   Jean Rosenthal, MD  famotidine (PEPCID) 20 MG tablet Take 1 tablet (20 mg total) by mouth daily. 12/31/21   Larene Pickett, PA-C  hydrOXYzine (VISTARIL) 50 MG capsule Take 1 capsule (50 mg total) by mouth at bedtime. 03/14/19   Jean Rosenthal, MD  levETIRAcetam (KEPPRA) 500 MG tablet Take 1 tablet (500 mg total) by mouth 2 (two) times daily. 04/30/22 07/29/22  Redwine, Madison A, PA-C  levothyroxine (SYNTHROID) 50 MCG tablet Take 1.5 tablets (75 mcg total) by mouth daily before breakfast. 08/16/21 11/14/21  [place] Donnamarie Poag, DO  omeprazole (PRILOSEC) 20 MG capsule Take 20 mg by mouth daily. 01/05/19   [provider]  ondansetron (ZOFRAN) 4 MG tablet Take 1 tablet (4 mg total) by mouth daily as needed for nausea or vomiting. 08/16/21 08/16/22  [place] Eric J, DO  oseltamivir (TAMIFLU) 75 MG capsule Take 1 capsule (75 mg total) by mouth every 12 (twelve) hours. 04/30/22   Milton Ferguson, MD  sucralfate (CARAFATE) 1 g tablet Take 1 tablet (1 g total) by mouth 4 (four) times daily -  with meals and at bedtime. 12/31/21   Larene Pickett, PA-C  topiramate (TOPAMAX) 50 MG tablet Take 2 tablets (100 mg total) by mouth  2 (two) times daily. Patient not taking: Reported on 08/14/2021 03/14/19   Jean Rosenthal, MD  traZODone (DESYREL) 100 MG tablet Take 1 tablet (100 mg total) by mouth at bedtime. 03/14/19   Jean Rosenthal, MD      Allergies    Patient has no known allergies.    Review of Systems   Review of Systems  Neurological:  Positive for seizures.    Physical Exam Updated Vital Signs BP (!) 154/116   Pulse (!) 113   Temp 98.6 F (37 C) (Rectal)   Resp (!) 22   Wt 40.8 kg   LMP 10/20/2012 Comment: pt. has had tubal ligation,no longer has peroids  SpO2 90%   BMI 17.00 kg/m  Physical Exam Vitals and nursing note reviewed.   Constitutional:      Comments: Awake, drowsy appearing, not following commands  HENT:     Head: Normocephalic and atraumatic.     Nose: Nose normal.     Mouth/Throat:     Mouth: Mucous membranes are moist.     Pharynx: Oropharynx is clear.     Comments: No evidence of tongue or lip bite Eyes:     Extraocular Movements: Extraocular movements intact.     Conjunctiva/sclera: Conjunctivae normal.     Comments: R pupil 2 mm, L pupil 3 mm, reactive to light bilaterally  Cardiovascular:     Rate and Rhythm: Regular rhythm. Tachycardia present.     Heart sounds: Normal heart sounds.  Pulmonary:     Effort: Pulmonary effort is normal.     Breath sounds: Normal breath sounds.     Comments: Mildly tachypneic Abdominal:     General: Abdomen is flat.     Palpations: Abdomen is soft.     Tenderness: There is no abdominal tenderness.  Musculoskeletal:        General: No swelling or deformity.     Cervical back: Normal range of motion and neck supple.     Right lower leg: No edema.     Left lower leg: No edema.  Skin:    General: Skin is warm and dry.     Findings: No bruising or rash.  Neurological:     Comments: Oriented x 0, occasionally mumbling incomprehensible sounds Spontaneously holding arms up in the air but not following commands Will track eyes to voice     ED Results / Procedures / Treatments   Labs (all labs ordered are listed, but only abnormal results are displayed) Labs Reviewed  HCG, SERUM, QUALITATIVE  ETHANOL    EKG EKG Interpretation  Date/Time:  Tuesday April 30 2022 20:38:42 EST Ventricular Rate:  97 PR Interval:  220 QRS Duration: 76 QT Interval:  372 QTC Calculation: 473 R Axis:   76 Text Interpretation: Sinus rhythm Prolonged PR interval Biatrial enlargement Probable anterior infarct, age indeterminate No significant change since last tracing 'today Confirmed by Leanord Asal ([id number]) on 04/30/2022 11:45:18 PM  Radiology CT Head Wo  Contrast  Result Date: 04/30/2022 CLINICAL DATA:  Head trauma, abnormal mental status (Age 1-64y) EXAM: CT HEAD WITHOUT CONTRAST TECHNIQUE: Contiguous axial images were obtained from the base of the skull through the vertex without intravenous contrast. RADIATION DOSE REDUCTION: This exam was performed according to the departmental dose-optimization program which includes automated exposure control, adjustment of the mA and/or kV according to patient size and/or use of iterative reconstruction technique. COMPARISON:  CT head 09/11/2018 FINDINGS: Brain: No evidence of large-territorial acute infarction. No parenchymal hemorrhage. No  mass lesion. No extra-axial collection. No mass effect or midline shift. No hydrocephalus. Basilar cisterns are patent. Vascular: No hyperdense vessel. Skull: No acute fracture or focal lesion. Sinuses/Orbits: Right maxillary sinus mucosal thickening. Otherwise paranasal sinuses and mastoid air cells are clear. The orbits are unremarkable. Other: None. IMPRESSION: No acute intracranial abnormality. Electronically Signed   By: Iven Finn M.D.   On: 04/30/2022 21:42   DG Chest 2 View  Result Date: 04/29/2022 CLINICAL DATA:  cough EXAM: CHEST - 2 VIEW COMPARISON:  Chest x-ray 12/30/2021 FINDINGS: The heart and mediastinal contours are within normal limits. Chronic multiple scattered calcified pulmonary nodules likely sequelae of prior granulomatous disease. No focal consolidation. No pulmonary edema. No pleural effusion. No pneumothorax. No acute osseous abnormality. Dextroscoliosis of the midthoracic spine. IMPRESSION: No active cardiopulmonary disease. Electronically Signed   By: Iven Finn M.D.   On: 04/29/2022 20:20    Procedures .Critical Care  Performed by: Kemper Durie, DO Authorized by: Kemper Durie, DO   Critical care provider statement:    Critical care time (minutes):  45     Medications Ordered in ED Medications  LORazepam (ATIVAN)  injection 1 mg (has no administration in time range)  magnesium sulfate (IV Push/IM) injection 2 g (2 g Intravenous Given 04/30/22 2106)  lactated ringers bolus 1,000 mL (0 mLs Intravenous Stopped 04/30/22 2241)  levETIRAcetam (KEPPRA) IVPB 1000 mg/100 mL premix (0 mg Intravenous Stopped 04/30/22 2145)    And  levETIRAcetam (KEPPRA) IVPB 500 mg/100 mL premix (0 mg Intravenous Stopped 04/30/22 2145)  LORazepam (ATIVAN) injection 2 mg (2 mg Intravenous Given 04/30/22 2115)    ED Course/ Medical Decision Making/ A&P Clinical Course as of 04/30/22 2351  Tue Apr 30, 2022  2239 Patient did have 1 seizure in the ED and received 2 mg of Ativan.  Her seizure stopped spontaneously prior to receiving Ativan.  After 2 hours, the patient remained postictal without return to neurologic baseline.  CT head showed no acute abnormalities.  Neurology was consulted.  I spoke with Dr. Leonel Ramsay of neurology who recommended ED to ED transfer for EEG to [facility]. [VK]  2240 Dr. Francia Greaves is accepting. [VK]  2344 Patient did have 1 additional brief seizure just prior to leaving with transport to [facility] and was given an additional 1 mg of Ativan.  Seizure did stop prior to Ativan administration. [VK]    Clinical Course User Index [VK] Kemper Durie, DO                           Medical Decision Making This patient presents to the ED with chief complaint(s) of seizure with pertinent past medical history of seizure disorder with medication non-compliance, HTN, prior laryngeal cancer which further complicates the presenting complaint. The complaint involves an extensive differential diagnosis and also carries with it a high risk of complications and morbidity.    The differential diagnosis includes breakthrough seizure, status epilepticus, electrolyte abnormality, ICH, mass effect, no other signs of trauma seen on exam  Additional history obtained: Additional history obtained from family Records reviewed  recent ED records  ED Course and Reassessment: Was immediately called to bedside on patient's arrival due to her postictal state.  She was tachycardic and initially hypoxic to the 70s on room air.  She was given supplemental O2 and her oxygen improved to the 90s.  The patient was awake but not following commands and postictal appearing on her arrival.  She will be loaded with Keppra and will have a CT head performed as family had reported head trauma with one of her seizures today.  The patient was seen in the ED earlier today and was diagnosed with the flu and had significant electrolyte derangements including hypokalemia and hypomagnesemia.  She did receive potassium repletion and will be given additional magnesium repletion as this may have lowered her seizure threshold.  EKG on arrival showed sinus tachycardia.  Patient will be closely monitored for any further seizure activity.  Independent labs interpretation:  The following labs were independently interpreted: N/A  Independent visualization of imaging: - I independently visualized the following imaging with scope of interpretation limited to determining acute life threatening conditions related to emergency care: CTH, which revealed no acute disease  Consultation: - Consulted or discussed management/test interpretation w/ external professional: neurology  Consideration for admission or further workup: patient requires admission for further management and evaluation of her status epilepticus  Social Determinants of health: N/A    Amount and/or Complexity of Data Reviewed Labs: ordered. Radiology: ordered.  Risk Prescription drug management.          Final Clinical Impression(s) / ED Diagnoses Final diagnoses:  Status epilepticus ([place])  Influenza A  Hypokalemia  Hypomagnesemia    Rx / DC Orders ED Discharge Orders     None         Kemper Durie, DO 04/30/22 2351

## 2022-04-30 NOTE — Discharge Instructions (Signed)
Plenty of fluids.  Follow-up with your family doctor in a week for recheck and to see if your potassium is doing well.  Make sure you take your blood pressure medicines

## 2022-04-30 NOTE — ED Notes (Signed)
Arranged transport with [facility] to her hotel. Pt refuses to sign voluntary form. This pt has been here over 24 hours, seen twice and refuses to leave. [employer] will have to become involved for trespassing at this time.

## 2022-04-30 NOTE — ED Provider Notes (Addendum)
[place] DEPT Provider Note   CSN: [id number] Arrival date & time: 04/30/22  8295     History  Chief Complaint  Patient presents with   Influenza    Crystal Spence is a 46 y.o. female.  Patient has a history of hypertension.  She presents with cough congestion and weakness  The history is provided by the patient and medical records.  Cough Cough characteristics:  Non-productive Sputum characteristics:  Nondescript Severity:  Moderate Onset quality:  Sudden Timing:  Constant Progression:  Worsening Chronicity:  New Context: not animal exposure   Associated symptoms: no chest pain, no eye discharge, no headaches and no rash        Home Medications Prior to Admission medications   Medication Sig Start Date End Date Taking? Authorizing Provider  acetaminophen (TYLENOL) 500 MG tablet Take 500-1,000 mg by mouth every 6 (six) hours as needed for mild pain or headache.    [provider]  bisacodyl 5 MG EC tablet Take 5 mg by mouth daily as needed for mild constipation or moderate constipation.    [provider]  diltiazem (CARDIZEM CD) 240 MG 24 hr capsule Take 1 capsule (240 mg total) by mouth daily. 04/30/22   Redwine, Madison A, PA-C  DULoxetine (CYMBALTA) 30 MG capsule Take 1 capsule (30 mg total) by mouth 2 (two) times daily. Patient taking differently: Take 30 mg by mouth daily. 03/14/19   Jean Rosenthal, MD  famotidine (PEPCID) 20 MG tablet Take 1 tablet (20 mg total) by mouth daily. 12/31/21   Larene Pickett, PA-C  hydrOXYzine (VISTARIL) 50 MG capsule Take 1 capsule (50 mg total) by mouth at bedtime. 03/14/19   Jean Rosenthal, MD  levETIRAcetam (KEPPRA) 500 MG tablet Take 1 tablet (500 mg total) by mouth 2 (two) times daily. 04/30/22 07/29/22  Redwine, Madison A, PA-C  levothyroxine (SYNTHROID) 50 MCG tablet Take 1.5 tablets (75 mcg total) by mouth daily before breakfast. 08/16/21 11/14/21  [place] Donnamarie Poag, DO  omeprazole  (PRILOSEC) 20 MG capsule Take 20 mg by mouth daily. 01/05/19   [provider]  ondansetron (ZOFRAN) 4 MG tablet Take 1 tablet (4 mg total) by mouth daily as needed for nausea or vomiting. 08/16/21 08/16/22  [place] Donnamarie Poag, DO  sucralfate (CARAFATE) 1 g tablet Take 1 tablet (1 g total) by mouth 4 (four) times daily -  with meals and at bedtime. 12/31/21   Larene Pickett, PA-C  topiramate (TOPAMAX) 50 MG tablet Take 2 tablets (100 mg total) by mouth 2 (two) times daily. Patient not taking: Reported on 08/14/2021 03/14/19   Jean Rosenthal, MD  traZODone (DESYREL) 100 MG tablet Take 1 tablet (100 mg total) by mouth at bedtime. 03/14/19   Jean Rosenthal, MD      Allergies    Patient has no known allergies.    Review of Systems   Review of Systems  Constitutional:  Negative for appetite change and fatigue.  HENT:  Negative for congestion, ear discharge and sinus pressure.   Eyes:  Negative for discharge.  Respiratory:  Positive for cough.   Cardiovascular:  Negative for chest pain.  Gastrointestinal:  Negative for abdominal pain and diarrhea.  Genitourinary:  Negative for frequency and hematuria.  Musculoskeletal:  Negative for back pain.  Skin:  Negative for rash.  Neurological:  Negative for seizures and headaches.  Psychiatric/Behavioral:  Negative for hallucinations.     Physical Exam Updated Vital Signs BP Marland Kitchen)  186/131   Pulse 88   Temp 98.3 F (36.8 C) (Oral)   Resp 18   Ht '5\' 1"'$  (1.549 m)   Wt 40.8 kg   LMP 10/20/2012 Comment: pt. has had tubal ligation,no longer has peroids  SpO2 93%   BMI 17.00 kg/m  Physical Exam Vitals and nursing note reviewed.  Constitutional:      Appearance: She is well-developed.  HENT:     Head: Normocephalic.     Mouth/Throat:     Mouth: Mucous membranes are moist.  Eyes:     General: No scleral icterus.    Conjunctiva/sclera: Conjunctivae normal.  Neck:     Thyroid: No thyromegaly.  Cardiovascular:     Rate and Rhythm: Normal rate and  regular rhythm.     Heart sounds: No murmur heard.    No friction rub. No gallop.  Pulmonary:     Breath sounds: No stridor. No wheezing or rales.  Chest:     Chest wall: No tenderness.  Abdominal:     General: There is no distension.     Tenderness: There is no abdominal tenderness. There is no rebound.  Musculoskeletal:        General: Normal range of motion.     Cervical back: Neck supple.  Lymphadenopathy:     Cervical: No cervical adenopathy.  Skin:    Findings: No erythema or rash.  Neurological:     Mental Status: She is alert and oriented to person, place, and time.     Motor: No abnormal muscle tone.     Coordination: Coordination normal.  Psychiatric:        Behavior: Behavior normal.     ED Results / Procedures / Treatments   Labs (all labs ordered are listed, but only abnormal results are displayed) Labs Reviewed  BASIC METABOLIC PANEL - Abnormal; Notable for the following components:      Result Value   Sodium 134 (*)    Potassium 2.6 (*)    Chloride 95 (*)    Glucose, Bld 151 (*)    Calcium 8.8 (*)    All other components within normal limits  CBC WITH DIFFERENTIAL/PLATELET - Abnormal; Notable for the following components:   RDW 15.6 (*)    Lymphs Abs 0.3 (*)    All other components within normal limits  I-STAT CHEM 8, ED - Abnormal; Notable for the following components:   BUN 4 (*)    Calcium, Ion 0.73 (*)    All other components within normal limits    EKG None  Radiology DG Chest 2 View  Result Date: 04/29/2022 CLINICAL DATA:  cough EXAM: CHEST - 2 VIEW COMPARISON:  Chest x-ray 12/30/2021 FINDINGS: The heart and mediastinal contours are within normal limits. Chronic multiple scattered calcified pulmonary nodules likely sequelae of prior granulomatous disease. No focal consolidation. No pulmonary edema. No pleural effusion. No pneumothorax. No acute osseous abnormality. Dextroscoliosis of the midthoracic spine. IMPRESSION: No active  cardiopulmonary disease. Electronically Signed   By: Iven Finn M.D.   On: 04/29/2022 20:20    Procedures Procedures    Medications Ordered in ED Medications  potassium chloride 10 mEq in 100 mL IVPB (has no administration in time range)  potassium chloride SA (KLOR-CON M) CR tablet 40 mEq (has no administration in time range)  sodium chloride 0.9 % bolus 500 mL (has no administration in time range)  potassium chloride SA (KLOR-CON M) CR tablet 40 mEq (has no administration in time range)  ED Course/ Medical Decision Making/ A&P                           Medical Decision Making Risk Prescription drug management.  This patient presents to the ED for concern of congestion and weakness, this involves an extensive number of treatment options, and is a complaint that carries with it a high risk of complications and morbidity.  The differential diagnosis includes viral syndrome dehydration   Co morbidities that complicate the patient evaluation  Hypertension   Additional history obtained:  Additional history obtained from patient External records from outside source obtained and reviewed including full records   Lab Tests:  I Ordered, and personally interpreted labs.  The pertinent results include: Potassium 2.6   Imaging Studies ordered:  No imaging  Cardiac Monitoring: / EKG:  The patient was maintained on a cardiac monitor.  I personally viewed and interpreted the cardiac monitored which showed an underlying rhythm of: Normal sinus rhythm   Consultations Obtained:  No consultant Problem List / ED Course / Critical interventions / Medication management  Hypertension and hypokalemia I ordered medication including saline and potassium Reevaluation of the patient after these medicines showed that the patient improved I have reviewed the patients home medicines and have made adjustments as needed   Social Determinants of Health:  None   Test / Admission  - Considered:  none  Patient with influenza and hypokalemia.  She has been given potassium to replace her hypokalemia and she will follow-up with her doctor.          Final Clinical Impression(s) / ED Diagnoses Final diagnoses:  Influenza  Hypokalemia    Rx / DC Orders ED Discharge Orders     None         Milton Ferguson, MD 05/09/22 1606    Milton Ferguson, MD 05/09/22 1611

## 2022-04-30 NOTE — ED Notes (Signed)
Pt is not allowing to get temp.

## 2022-04-30 NOTE — ED Notes (Signed)
RT called to patient's room d/t SpO2 73%. Patient placed on 5LNC initially, titrated down to [facility]. SpO2 94%.

## 2022-04-30 NOTE — ED Notes (Signed)
On arrival pt opening eyes and could follow some commands, attempting to speak to family but incomprehensible. With this RN bedside, pt began to seize and convulse. Emergency alarm pressed and additional staff of Rns and techs in the room. Provider at the bedside. Seizure lasted approx 20 seconds. Pt given IV ativan and orally suctioned. Incontinence of bladder during seizure.

## 2022-04-30 NOTE — ED Notes (Signed)
Report given via telephone to megan Lindsay charge nurse and rebecca with [facility] for transport.

## 2022-04-30 NOTE — ED Notes (Signed)
Per [facility] pt had seizure, MD notified and verbal orders for '1mg'$  ativan given prior to transport.

## 2022-04-30 NOTE — ED Provider Triage Note (Addendum)
Emergency Medicine Provider Triage Evaluation Note  Crystal Spence , a 46 y.o. female  was evaluated in triage.   Patient in ER for flulike illness x 4 days.  Seen earlier this morning and diagnosed with influenza B.  Patient did not have a ride home she checked back into the ER, unfortunately she is noted to be hypertensive 189/131 on recheck it is similar.  Discussed case with Dr. Armandina Gemma, we will check basic labs, patient currently asymptomatic.  Review of Systems  Positive: Rhinorrhea, cough, body aches Negative: Chest pain/shortness of breath, numbness, tingling, headache, vision change  Physical Exam  BP (!) 189/131 (BP Location: Left Arm)   Pulse 98   Temp 99.3 F (37.4 C) (Oral)   Resp 18   Ht '5\' 1"'$  (1.549 m)   Wt 40.8 kg   LMP 10/20/2012 Comment: pt. has had tubal ligation,no longer has peroids  SpO2 94%   BMI 17.00 kg/m  Gen:   Awake, no distress   Resp:  Normal effort  MSK:   Moves extremities without difficulty  Other:  Airway clear  Medical Decision Making  Medically screening exam initiated at 8:07 AM.  Appropriate orders placed.  Crystal Spence was informed that the remainder of the evaluation will be completed by another provider, this initial triage assessment does not replace that evaluation, and the importance of remaining in the ED until their evaluation is complete.    Addendum.  K2.6.  Potassium ordered.  Patient will need room.  Note: Portions of this report may have been transcribed using voice recognition software. Every effort was made to ensure accuracy; however, inadvertent computerized transcription errors may still be present.    Deliah Boston, PA-C 04/30/22 0809    Deliah Boston, PA-C 04/30/22 [phone]

## 2022-04-30 NOTE — ED Notes (Signed)
Pt has taken off her pants and socks, standing in room saying "Amen, Amen." Pt refuses to take medications or allow for her temperature to be checked.

## 2022-04-30 NOTE — ED Provider Notes (Signed)
[facility] DEPT Provider Note   CSN: [id number] Arrival date & time: 04/29/22  1929     History  Chief Complaint  Patient presents with   Cough   Hypertension    Crystal Spence is a 46 y.o. female presenting with a couple days worth of URI symptoms.  Has not been around anybody sick.  Most concerned about cough, congestion and occasional green sputum.  No shortness of breath or chest pain.   Cough Associated symptoms: chills   Hypertension       Home Medications Prior to Admission medications   Medication Sig Start Date End Date Taking? Authorizing Provider  acetaminophen (TYLENOL) 500 MG tablet Take 500-1,000 mg by mouth every 6 (six) hours as needed for mild pain or headache.    [provider]  bisacodyl 5 MG EC tablet Take 5 mg by mouth daily as needed for mild constipation or moderate constipation.    [provider]  diltiazem (CARDIZEM CD) 240 MG 24 hr capsule Take 1 capsule (240 mg total) by mouth daily. 03/14/19   Jean Rosenthal, MD  DULoxetine (CYMBALTA) 30 MG capsule Take 1 capsule (30 mg total) by mouth 2 (two) times daily. Patient taking differently: Take 30 mg by mouth daily. 03/14/19   Jean Rosenthal, MD  famotidine (PEPCID) 20 MG tablet Take 1 tablet (20 mg total) by mouth daily. 12/31/21   Larene Pickett, PA-C  hydrOXYzine (VISTARIL) 50 MG capsule Take 1 capsule (50 mg total) by mouth at bedtime. 03/14/19   Jean Rosenthal, MD  levETIRAcetam (KEPPRA) 500 MG tablet Take 1 tablet (500 mg total) by mouth 2 (two) times daily. 08/16/21 11/14/21  [place] Donnamarie Poag, DO  levothyroxine (SYNTHROID) 50 MCG tablet Take 1.5 tablets (75 mcg total) by mouth daily before breakfast. 08/16/21 11/14/21  [place] Donnamarie Poag, DO  omeprazole (PRILOSEC) 20 MG capsule Take 20 mg by mouth daily. 01/05/19   [provider]  ondansetron (ZOFRAN) 4 MG tablet Take 1 tablet (4 mg total) by mouth daily as needed for nausea or vomiting. 08/16/21 08/16/22   [place] Donnamarie Poag, DO  sucralfate (CARAFATE) 1 g tablet Take 1 tablet (1 g total) by mouth 4 (four) times daily -  with meals and at bedtime. 12/31/21   Larene Pickett, PA-C  topiramate (TOPAMAX) 50 MG tablet Take 2 tablets (100 mg total) by mouth 2 (two) times daily. Patient not taking: Reported on 08/14/2021 03/14/19   Jean Rosenthal, MD  traZODone (DESYREL) 100 MG tablet Take 1 tablet (100 mg total) by mouth at bedtime. 03/14/19   Jean Rosenthal, MD      Allergies    Patient has no known allergies.    Review of Systems   Review of Systems  Constitutional:  Positive for chills.  HENT:  Positive for congestion.   Respiratory:  Positive for cough.   Musculoskeletal:  Positive for arthralgias.    Physical Exam Updated Vital Signs BP (!) 173/98 (BP Location: Left Arm)   Pulse 90   Temp 98.3 F (36.8 C) (Oral)   Resp 18   Ht '5\' 1"'$  (1.549 m)   Wt 40.8 kg   LMP 10/20/2012 Comment: pt. has had tubal ligation,no longer has peroids  SpO2 98%   BMI 17.00 kg/m  Physical Exam Vitals and nursing note reviewed.  Constitutional:      Appearance: Normal appearance. She is ill-appearing (Stable).  HENT:     Head: Normocephalic and atraumatic.  Mouth/Throat:     Mouth: Mucous membranes are dry.     Pharynx: Oropharynx is clear.  Eyes:     General: No scleral icterus.    Conjunctiva/sclera: Conjunctivae normal.  Cardiovascular:     Rate and Rhythm: Normal rate and regular rhythm.  Pulmonary:     Effort: Pulmonary effort is normal. No respiratory distress.     Breath sounds: Wheezing present.     Comments: Expiratory Skin:    General: Skin is warm and dry.     Findings: No rash.  Neurological:     Mental Status: She is alert.  Psychiatric:        Mood and Affect: Mood normal.     ED Results / Procedures / Treatments   Labs (all labs ordered are listed, but only abnormal results are displayed) Labs Reviewed  RESP PANEL BY RT-PCR (RSV, FLU A&B, COVID)  RVPGX2 - Abnormal; Notable  for the following components:      Result Value   Influenza A by PCR POSITIVE (*)    All other components within normal limits    EKG None  Radiology DG Chest 2 View  Result Date: 04/29/2022 CLINICAL DATA:  cough EXAM: CHEST - 2 VIEW COMPARISON:  Chest x-ray 12/30/2021 FINDINGS: The heart and mediastinal contours are within normal limits. Chronic multiple scattered calcified pulmonary nodules likely sequelae of prior granulomatous disease. No focal consolidation. No pulmonary edema. No pleural effusion. No pneumothorax. No acute osseous abnormality. Dextroscoliosis of the midthoracic spine. IMPRESSION: No active cardiopulmonary disease. Electronically Signed   By: Iven Finn M.D.   On: 04/29/2022 20:20    Procedures Procedures   Medications Ordered in ED Medications - No data to display  ED Course/ Medical Decision Making/ A&P                           Medical Decision Making Amount and/or Complexity of Data Reviewed Radiology: ordered.   46 year old female presenting with URI symptoms.  Endorses a cough productive of green sputum, congestion, fever, body aches.  Per chart review patient has a history of seizure disorder.  Also has a history of hypertension.  She has not been compliant with her medications.  I will refill them today.  X-ray ordered in triage.  Viewed and interpreted by me.  Agree with radiology that there are no signs of pneumonia.  MDM/disposition: 46 year old female presenting today with URI symptoms.  She has some expiratory wheezing on physical exam.  She was pulled back to the triage room for me to evaluate her after her 10-hour wait in the lobby.  I told her that she had the flu and gave her the option to continue to wait for room for full treatment and she told me she would prefer to go home.  She is satting normally on room air.  Not tachycardic.  Afebrile.  I believe it is reasonable to let her go home from the triage area.  Strict return precautions  were given.  Additionally her antiepileptics and antihypertensives were refilled.  Due to her wheezing and not wanting to wait for further evaluation I also gave her an albuterol inhaler.    Final Clinical Impression(s) / ED Diagnoses Final diagnoses:  Flu    Rx / DC Orders ED Discharge Orders     None      Results and diagnoses were explained to the patient. Return precautions discussed in full. Patient had no additional questions and  expressed complete understanding.   This chart was dictated using voice recognition software.  Despite best efforts to proofread,  errors can occur which can change the documentation meaning.    Rhae Hammock, PA-C 15/17/61 [id number]    Delora Fuel, MD 71/06/26 [phone]

## 2022-04-30 NOTE — ED Triage Notes (Signed)
Pt has history of seizures and was witnessed to have a seizure approx 1.5 hour ago. Unknown if pt has missed doses of her meds. Pt does appear to be post-ictal at this time and nonverbal.

## 2022-04-30 NOTE — ED Notes (Signed)
Pt back from CT, O2 cont to be 88-90% on 3L [place], increased to 5L [place]. Pt postictal after active seizure, pt is lethargic and opens eyes to sternal rub. Pt family at the bedside. MD aware of unequal pupils and sts she will assess.

## 2022-04-30 NOTE — Discharge Instructions (Addendum)
You came to the department today due to flulike symptoms.  You tested positive for the flu.  This sort of viral illness is something that can be treated with over-the-counter medications like Tylenol and ibuprofen.  Other options include DayQuil/NyQuil, Mucinex for congestion, Robitussin/Delsym for cough and any other over-the-counter medications.  It is very important that you stay hydrated during this time as well.  You may use things like Powerade, Gatorade, electrolyte powders and water.  We hope that you feel better and do not hesitate to return to the emergency department with any worsening symptoms, especially chest pain, shortness of breath, dizziness and loss of consciousness.  Additionally, I have refilled your hypertension and seizure medications.

## 2022-04-30 NOTE — ED Triage Notes (Signed)
Pt was sitting in lobby, checked back in with Flu and congestion. Pt states she cannot get a ride home to her hotel. Pt vague other than when wants [place] to fix her. Encouraged her not to eat cheeto's if she has the flu.

## 2022-04-30 NOTE — ED Notes (Signed)
Date and time results received: 04/30/22 0856   Test: Potassium Critical Value: 2.6  Name of Provider Notified: Trixie Deis.   No verbal orders received.

## 2022-04-30 NOTE — ED Notes (Addendum)
Pt bed linen changed. Preparing pt for transport to CT with this RN and RT. Pt protecting airway but cont to require O2.

## 2022-05-01 ENCOUNTER — Emergency Department (HOSPITAL_COMMUNITY): Payer: Medicaid Other

## 2022-05-01 ENCOUNTER — Other Ambulatory Visit: Payer: Self-pay

## 2022-05-01 ENCOUNTER — Encounter (HOSPITAL_COMMUNITY): Payer: Self-pay | Admitting: Family Medicine

## 2022-05-01 ENCOUNTER — Inpatient Hospital Stay (HOSPITAL_COMMUNITY): Payer: Medicaid Other

## 2022-05-01 DIAGNOSIS — F1090 Alcohol use, unspecified, uncomplicated: Secondary | ICD-10-CM | POA: Diagnosis present

## 2022-05-01 DIAGNOSIS — D649 Anemia, unspecified: Secondary | ICD-10-CM | POA: Diagnosis present

## 2022-05-01 DIAGNOSIS — G8191 Hemiplegia, unspecified affecting right dominant side: Secondary | ICD-10-CM | POA: Diagnosis present

## 2022-05-01 DIAGNOSIS — I10 Essential (primary) hypertension: Secondary | ICD-10-CM | POA: Diagnosis present

## 2022-05-01 DIAGNOSIS — R443 Hallucinations, unspecified: Secondary | ICD-10-CM | POA: Diagnosis present

## 2022-05-01 DIAGNOSIS — R1312 Dysphagia, oropharyngeal phase: Secondary | ICD-10-CM | POA: Diagnosis present

## 2022-05-01 DIAGNOSIS — E871 Hypo-osmolality and hyponatremia: Secondary | ICD-10-CM | POA: Diagnosis present

## 2022-05-01 DIAGNOSIS — E876 Hypokalemia: Secondary | ICD-10-CM

## 2022-05-01 DIAGNOSIS — I160 Hypertensive urgency: Secondary | ICD-10-CM | POA: Diagnosis present

## 2022-05-01 DIAGNOSIS — E039 Hypothyroidism, unspecified: Secondary | ICD-10-CM

## 2022-05-01 DIAGNOSIS — G9341 Metabolic encephalopathy: Secondary | ICD-10-CM | POA: Diagnosis present

## 2022-05-01 DIAGNOSIS — J9601 Acute respiratory failure with hypoxia: Secondary | ICD-10-CM | POA: Diagnosis not present

## 2022-05-01 DIAGNOSIS — G40901 Epilepsy, unspecified, not intractable, with status epilepticus: Principal | ICD-10-CM

## 2022-05-01 DIAGNOSIS — Z8521 Personal history of malignant neoplasm of larynx: Secondary | ICD-10-CM

## 2022-05-01 DIAGNOSIS — J101 Influenza due to other identified influenza virus with other respiratory manifestations: Secondary | ICD-10-CM | POA: Diagnosis present

## 2022-05-01 DIAGNOSIS — J690 Pneumonitis due to inhalation of food and vomit: Secondary | ICD-10-CM | POA: Diagnosis not present

## 2022-05-01 DIAGNOSIS — R918 Other nonspecific abnormal finding of lung field: Secondary | ICD-10-CM | POA: Diagnosis present

## 2022-05-01 DIAGNOSIS — Z681 Body mass index (BMI) 19 or less, adult: Secondary | ICD-10-CM | POA: Diagnosis not present

## 2022-05-01 DIAGNOSIS — R4701 Aphasia: Secondary | ICD-10-CM | POA: Diagnosis present

## 2022-05-01 DIAGNOSIS — R262 Difficulty in walking, not elsewhere classified: Secondary | ICD-10-CM | POA: Diagnosis present

## 2022-05-01 DIAGNOSIS — Z781 Physical restraint status: Secondary | ICD-10-CM | POA: Diagnosis not present

## 2022-05-01 DIAGNOSIS — G40909 Epilepsy, unspecified, not intractable, without status epilepticus: Secondary | ICD-10-CM | POA: Diagnosis not present

## 2022-05-01 DIAGNOSIS — E43 Unspecified severe protein-calorie malnutrition: Secondary | ICD-10-CM | POA: Diagnosis present

## 2022-05-01 DIAGNOSIS — Z1152 Encounter for screening for COVID-19: Secondary | ICD-10-CM | POA: Diagnosis not present

## 2022-05-01 LAB — CBC
HCT: 45.4 % (ref 36.0–46.0)
Hemoglobin: 15.4 g/dL — ABNORMAL HIGH (ref 12.0–15.0)
MCH: 32.9 pg (ref 26.0–34.0)
MCHC: 33.9 g/dL (ref 30.0–36.0)
MCV: 97 fL (ref 80.0–100.0)
Platelets: 306 10*3/uL (ref 150–400)
RBC: 4.68 MIL/uL (ref 3.87–5.11)
RDW: 15.9 % — ABNORMAL HIGH (ref 11.5–15.5)
WBC: 17.2 10*3/uL — ABNORMAL HIGH (ref 4.0–10.5)
nRBC: 0 % (ref 0.0–0.2)

## 2022-05-01 LAB — CBC WITH DIFFERENTIAL/PLATELET
Abs Immature Granulocytes: 0.12 10*3/uL — ABNORMAL HIGH (ref 0.00–0.07)
Basophils Absolute: 0 10*3/uL (ref 0.0–0.1)
Basophils Relative: 0 %
Eosinophils Absolute: 3 10*3/uL — ABNORMAL HIGH (ref 0.0–0.5)
Eosinophils Relative: 13 %
HCT: 43 % (ref 36.0–46.0)
Hemoglobin: 14.1 g/dL (ref 12.0–15.0)
Immature Granulocytes: 1 %
Lymphocytes Relative: 1 %
Lymphs Abs: 0.3 10*3/uL — ABNORMAL LOW (ref 0.7–4.0)
MCH: 32.4 pg (ref 26.0–34.0)
MCHC: 32.8 g/dL (ref 30.0–36.0)
MCV: 98.9 fL (ref 80.0–100.0)
Monocytes Absolute: 1.1 10*3/uL — ABNORMAL HIGH (ref 0.1–1.0)
Monocytes Relative: 5 %
Neutro Abs: 18.4 10*3/uL — ABNORMAL HIGH (ref 1.7–7.7)
Neutrophils Relative %: 80 %
Platelets: 355 10*3/uL (ref 150–400)
RBC: 4.35 MIL/uL (ref 3.87–5.11)
RDW: 15.6 % — ABNORMAL HIGH (ref 11.5–15.5)
WBC: 22.9 10*3/uL — ABNORMAL HIGH (ref 4.0–10.5)
nRBC: 0 % (ref 0.0–0.2)

## 2022-05-01 LAB — RAPID URINE DRUG SCREEN, HOSP PERFORMED
Amphetamines: NOT DETECTED
Barbiturates: NOT DETECTED
Benzodiazepines: NOT DETECTED
Cocaine: NOT DETECTED
Opiates: NOT DETECTED
Tetrahydrocannabinol: POSITIVE — AB

## 2022-05-01 LAB — COMPREHENSIVE METABOLIC PANEL
ALT: 17 U/L (ref 0–44)
ALT: 14 U/L (ref 0–44)
AST: 45 U/L — ABNORMAL HIGH (ref 15–41)
AST: 29 U/L (ref 15–41)
Albumin: 3.2 g/dL — ABNORMAL LOW (ref 3.5–5.0)
Albumin: 2.7 g/dL — ABNORMAL LOW (ref 3.5–5.0)
Alkaline Phosphatase: 111 U/L (ref 38–126)
Alkaline Phosphatase: 95 U/L (ref 38–126)
Anion gap: 10 (ref 5–15)
Anion gap: 14 (ref 5–15)
BUN: 7 mg/dL (ref 6–20)
BUN: 5 mg/dL — ABNORMAL LOW (ref 6–20)
CO2: 30 mmol/L (ref 22–32)
CO2: 21 mmol/L — ABNORMAL LOW (ref 22–32)
Calcium: 8.1 mg/dL — ABNORMAL LOW (ref 8.9–10.3)
Calcium: 7.4 mg/dL — ABNORMAL LOW (ref 8.9–10.3)
Chloride: 95 mmol/L — ABNORMAL LOW (ref 98–111)
Chloride: 102 mmol/L (ref 98–111)
Creatinine, Ser: 0.79 mg/dL (ref 0.44–1.00)
Creatinine, Ser: 0.76 mg/dL (ref 0.44–1.00)
GFR, Estimated: >60 mL/min (ref >60)
GFR, Estimated: >60 mL/min (ref >60)
Glucose, Bld: 128 mg/dL — ABNORMAL HIGH (ref 70–99)
Glucose, Bld: 77 mg/dL (ref 70–99)
Potassium: 3.1 mmol/L — ABNORMAL LOW (ref 3.5–5.1)
Potassium: 3.1 mmol/L — ABNORMAL LOW (ref 3.5–5.1)
Sodium: 135 mmol/L (ref 135–145)
Sodium: 137 mmol/L (ref 135–145)
Total Bilirubin: 1.3 mg/dL — ABNORMAL HIGH (ref 0.3–1.2)
Total Bilirubin: 1 mg/dL (ref 0.3–1.2)
Total Protein: 6.2 g/dL — ABNORMAL LOW (ref 6.5–8.1)
Total Protein: 5.5 g/dL — ABNORMAL LOW (ref 6.5–8.1)

## 2022-05-01 LAB — PHOSPHORUS: Phosphorus: 1.9 mg/dL — ABNORMAL LOW (ref 2.5–4.6)

## 2022-05-01 LAB — GLUCOSE, CAPILLARY
Glucose-Capillary: 74 mg/dL (ref 70–99)
Glucose-Capillary: 91 mg/dL (ref 70–99)

## 2022-05-01 LAB — MAGNESIUM
Magnesium: 2.5 mg/dL — ABNORMAL HIGH (ref 1.7–2.4)
Magnesium: 1.8 mg/dL (ref 1.7–2.4)

## 2022-05-01 MED ORDER — DEXTROSE 50 % IV SOLN: 12.5000 g | INTRAVENOUS | Status: AC

## 2022-05-01 MED ORDER — ENOXAPARIN SODIUM 300 MG/3ML IJ SOLN
20.0000 mg | INTRAMUSCULAR | Status: AC
  Administered 2022-05-01 – 2022-05-13 (×13): 20 mg via SUBCUTANEOUS
  Filled 2022-05-01 (×15): qty 0.2

## 2022-05-01 MED ORDER — POTASSIUM CHLORIDE 10 MEQ/100ML IV SOLN
10.0000 meq | INTRAVENOUS | Status: AC
  Administered 2022-05-01 (×4): 10 meq via INTRAVENOUS
  Filled 2022-05-01 (×3): qty 100

## 2022-05-01 MED ORDER — ONDANSETRON HCL 4 MG/2ML IJ SOLN: 4.0000 mg | Freq: Four times a day (QID) | INTRAMUSCULAR | Status: DC | PRN

## 2022-05-01 MED ORDER — SENNOSIDES-DOCUSATE SODIUM 8.6-50 MG PO TABS: 1.0000 | ORAL_TABLET | Freq: Every evening | ORAL | Status: DC | PRN

## 2022-05-01 MED ORDER — THIAMINE HCL 100 MG/ML IJ SOLN
100.0000 mg | Freq: Every day | INTRAMUSCULAR | Status: AC
  Administered 2022-05-01 – 2022-05-05 (×5): 100 mg via INTRAVENOUS
  Filled 2022-05-01 (×5): qty 2

## 2022-05-01 MED ORDER — LABETALOL HCL 5 MG/ML IV SOLN
10.0000 mg | INTRAVENOUS | Status: DC | PRN
  Administered 2022-05-02: 10 mg via INTRAVENOUS
  Filled 2022-05-01: qty 4

## 2022-05-01 MED ORDER — LEVETIRACETAM IN NACL 1000 MG/100ML IV SOLN
1000.0000 mg | Freq: Two times a day (BID) | INTRAVENOUS | Status: DC
  Administered 2022-05-01 – 2022-05-15 (×30): 1000 mg via INTRAVENOUS
  Filled 2022-05-01 (×30): qty 100

## 2022-05-01 MED ORDER — PANTOPRAZOLE SODIUM 40 MG PO TBEC: 40.0000 mg | DELAYED_RELEASE_TABLET | Freq: Every day | ORAL | Status: DC

## 2022-05-01 MED ORDER — LEVETIRACETAM IN NACL 1500 MG/100ML IV SOLN
1500.0000 mg | Freq: Once | INTRAVENOUS | Status: AC
  Administered 2022-05-01: 1500 mg via INTRAVENOUS
  Filled 2022-05-01: qty 100

## 2022-05-01 MED ORDER — ONDANSETRON HCL 4 MG PO TABS: 4.0000 mg | ORAL_TABLET | Freq: Four times a day (QID) | ORAL | Status: DC | PRN

## 2022-05-01 MED ORDER — ACETAMINOPHEN 650 MG RE SUPP: 650.0000 mg | Freq: Four times a day (QID) | RECTAL | Status: DC | PRN

## 2022-05-01 MED ORDER — SODIUM CHLORIDE 0.9 % IV SOLN
1000.0000 mg | Freq: Once | INTRAVENOUS | Status: AC
  Administered 2022-05-01: 1000 mg via INTRAVENOUS
  Filled 2022-05-01: qty 7.69

## 2022-05-01 MED ORDER — DILTIAZEM HCL ER COATED BEADS 240 MG PO CP24
240.0000 mg | ORAL_CAPSULE | Freq: Every day | ORAL | Status: DC
  Filled 2022-05-01: qty 1

## 2022-05-01 MED ORDER — ACETAMINOPHEN 325 MG PO TABS
650.0000 mg | ORAL_TABLET | Freq: Four times a day (QID) | ORAL | Status: DC | PRN
  Filled 2022-05-01: qty 2

## 2022-05-01 MED ORDER — SODIUM CHLORIDE 0.9% FLUSH
3.0000 mL | Freq: Two times a day (BID) | INTRAVENOUS | Status: DC
  Administered 2022-05-01 – 2022-05-16 (×21): 3 mL via INTRAVENOUS

## 2022-05-01 MED ORDER — SODIUM CHLORIDE 0.9 % IV SOLN: INTRAVENOUS | Status: AC

## 2022-05-01 MED ORDER — LORAZEPAM 2 MG/ML IJ SOLN: 2.0000 mg | INTRAMUSCULAR | Status: DC | PRN

## 2022-05-01 MED ORDER — SODIUM CHLORIDE 0.9 % IV SOLN
200.0000 mg | Freq: Once | INTRAVENOUS | Status: AC
  Administered 2022-05-01: 200 mg via INTRAVENOUS
  Filled 2022-05-01: qty 20

## 2022-05-01 MED ORDER — LEVOTHYROXINE SODIUM 75 MCG PO TABS: 75.0000 ug | ORAL_TABLET | Freq: Every day | ORAL | Status: DC

## 2022-05-01 MED ORDER — OSELTAMIVIR PHOSPHATE 30 MG PO CAPS
30.0000 mg | ORAL_CAPSULE | Freq: Two times a day (BID) | ORAL | Status: DC
  Filled 2022-05-01 (×9): qty 1

## 2022-05-01 MED ORDER — SODIUM CHLORIDE 0.9 % IV SOLN
100.0000 mg | Freq: Two times a day (BID) | INTRAVENOUS | Status: AC
  Administered 2022-05-01 – 2022-05-16 (×31): 100 mg via INTRAVENOUS
  Filled 2022-05-01 (×32): qty 10

## 2022-05-01 MED ORDER — PHENOBARBITAL SODIUM 130 MG/ML IJ SOLN
65.0000 mg | Freq: Every day | INTRAMUSCULAR | Status: DC
  Administered 2022-05-02: 65 mg via INTRAVENOUS
  Filled 2022-05-01: qty 1

## 2022-05-01 NOTE — Consult Note (Signed)
Neurology Consultation Reason for Consult: Seizure Referring Physician: Christy Gentles, D  CC: Seizures  History is obtained from: Chart review  HPI: Crystal Spence is a 46 y.o. female with a history of seizures who missed several doses of her Keppra and also has flu who presents with recurrent seizures.  She actually sought care earlier in the day at [facility] for flu and was diagnosed with flu, and then after leaving had a seizure.  She presented to [facility].  There she was initially following commands, but then was seen to have another seizure around 9, but she is very slow to come around.  She was given Ativan 2 mg as well as Keppra 1500 mg. I discussed with Dr. Maylon Peppers around 10:30 PM and at that time she was still not following commands therefore I advised ED to ED transfer for stat EEG.  [facility] arrival, she was witnessed to have another seizure and was given another 1 mg of Ativan.  She continues to be encephalopathic and does not follow commands.     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)      Family History  Problem Relation Age of Onset   Cancer Maternal Grandmother        skin cancer     Social History:  reports that she quit smoking about 11 years ago. She has a 15.00 pack-year smoking history. She has never used smokeless tobacco. She reports current alcohol use of about 2.0 standard drinks of alcohol per week. She reports that she does not use drugs.   Exam: Current vital signs: BP (!) 159/114   Pulse 97   Temp (!) 97.4 F (36.3 C) (Axillary)   Resp (!) 22   Wt 40.8 kg   LMP 10/20/2012 Comment: pt. has had tubal ligation,no longer has peroids   SpO2 100%   BMI 17.00 kg/m  Vital signs in last 24 hours: Temp:  [97.4 F (36.3 C)-99.3 F (37.4 C)] 97.4 F (36.3 C) (12/20 0123) Pulse Rate:  [86-132] 97 (12/20 0115) Resp:  [7-34] 22 (12/20 0115) BP: (154-253)/(111-175) 159/114 (12/20 0115) SpO2:  [73 %-100 %] 100 % (12/20 0115) Weight:  [40.8 kg] 40.8 kg (12/19 2029)   Physical Exam  Neuro: Mental Status: Her eyes open slightly to noxious stimulation, but she does not engage the examiner and does not follow commands. Cranial Nerves: II: She does not blink to threat. Pupils are equal, round, and reactive to light.   III,IV, VI: Eyes are midline, I do not get her to cross midline to either direction. VII: Facial movement is symmetric.  Motor: She moves her right arm slightly more than her left, but withdraws all extremities fairly minimally but evenly to noxious stimulation. Sensory: As above  Deep Tendon Reflexes: 2+ and symmetric in the biceps and patellae.  Cerebellar: Does not perform     I have reviewed labs in epic and the results pertinent to this consultation are: Sodium 135 Magnesium 2.5 Calcium 8.1, albumin 3.2 Creatinine 0.79  I have reviewed the images obtained: CT head-negative  Impression: 46 year old female with recurrent seizures without return to baseline concerning for status epilepticus.  She will need stat EEG and I would favor continuous monitoring.  She has received a load of 1500 mg of Keppra, 2500 grams would be 60/kg and this was several hours ago.  I will finish the load, and give slightly more since some of it is already been cleared.  Recommendations: 1) additional 1500 mg of Keppra x 1 2) continue Keppra at 1 g twice daily 3) stat EEG 4) neurology will follow   Roland Rack, MD [facility] [phone]  If 7pm- 7am, please page neurology on call as listed in [place].

## 2022-05-01 NOTE — Progress Notes (Signed)
Rec'd pt from ED pt minimally responsive eyes open gazing off to the left responds to painful stimulus attempts to push stimulus away and saying "quit" abrasions noted to bilateral upper extremities

## 2022-05-01 NOTE — Progress Notes (Signed)
Patient and EEG machine has been moved to 3w, .  Room doesn't have functioning ethernet port, and WIFI signal not holding connection.  Study is not accessible remotely. IT has been contacted.

## 2022-05-01 NOTE — ED Provider Notes (Signed)
I assumed care of patient after she was transferred from [facility] for intractable seizures.  Patient with known diagnosis of seizures and recent diagnosis of influenza.  Patient required multiple medications including Keppra and IV Ativan prior to transfer.  On my exam patient is sleeping but arousable.  No active seizures at this time.  No signs of trauma   ED ECG REPORT   Date: 05/01/2022  00:41  Rate: 101  Rhythm: sinus tachycardia  QRS Axis: normal  Intervals: QT prolonged  ST/T Wave abnormalities: nonspecific ST changes  Conduction Disutrbances:none  I have personally reviewed the EKG tracing and agree with the computerized printout as noted.   I personally visualized the chest x-ray that is negative for acute changes.  I have consulted neurology Dr. Kathrynn Speed who will see the patient and start EEG.  Patient will be admitted to the hospital.   .Critical Care  Performed by: Ripley Fraise, MD Authorized by: Ripley Fraise, MD   Critical care provider statement:    Critical care time (minutes):  45   Critical care start time:  05/01/2022 12:00 AM   Critical care end time:  05/01/2022 12:45 AM   Critical care time was exclusive of:  Separately billable procedures and treating other patients   Critical care was necessary to treat or prevent imminent or life-threatening deterioration of the following conditions:  CNS failure or compromise   Critical care was time spent personally by me on the following activities:  Pulse oximetry, ordering and review of radiographic studies, ordering and review of laboratory studies, discussions with consultants and re-evaluation of patient's condition   I assumed direction of critical care for this patient from another provider in my specialty: yes     Care discussed with: admitting provider       Ripley Fraise, MD 05/01/22 [phone]

## 2022-05-01 NOTE — Progress Notes (Signed)
STAT EEG complete - results pending. ? ?

## 2022-05-01 NOTE — H&P (Addendum)
History and Physical    Crystal Spence VPX:[id number] DOB: [birth year] DOA: 04/30/2022  PCP: Trey Sailors, PA   Patient coming from: Home   Chief Complaint: seizures   HPI: Crystal Spence is a 46 y.o. 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  Patient was seen in the emergency department on 04/29/2022 with 2 days of cough, chills, congestion, and was diagnosed with influenza A.  She was started on Tamiflu and discharged home but then had a generalized seizure.  She had additional witnessed seizure at [employer] where she had no acute findings on head CT and was treated with Ativan and 1500 mg of IV Keppra.  Neurology was consulted by the ED physician and the patient was transferred to [facility] for stat EEG.   Patient's daughter is at the bedside, states that she spoke with the patient 2 days ago, and patient was complaining that she did not feel well at that time, was worried she would have a seizure, and was asking for transportation to go get a refill of her seizure medication.  It is unclear how long she had been out of her antiepileptics.  ED Course: Upon arrival to the ED, patient is found to be afebrile, severely hypertensive, tachypnea, and with oxygen saturation of 73% on room air.  Head CT was negative for acute intracranial abnormality and chest x-ray negative for acute cardiopulmonary disease.  Blood work most notable for potassium 3.1 and WBC 22,900.   She was treated with 1 L of LR, 1.5 g IV Keppra, 2 doses of Ativan, 2 g IV magnesium, and then another 1500 mg of IV Keppra.  Review of Systems:  ROS limited by patient's clinical condition.  Past Medical History:  Diagnosis Date   Anemia, unspecified 06/17/2013   Headache    History of laryngeal cancer 02/03/2013    Hypertension    Hypothyroidism (acquired) 06/17/2013   Multiple lung nodules 06/28/2014   Pneumonia 01/14/2014, 04/2017   Poor oral hygiene 06/17/2013   Rib pain 01/14/2014   Rib pain on left side 01/05/2014   Throat cancer [facility])     Past Surgical History:  Procedure Laterality Date   ABDOMINAL SURGERY     BIOPSY  01/18/2018   Procedure: BIOPSY;  Surgeon: Wonda Horner, MD;  Location: [place];  Service: Endoscopy;;   COLONOSCOPY WITH PROPOFOL N/A 10/12/2015   Procedure: COLONOSCOPY WITH PROPOFOL;  Surgeon: Wonda Horner, MD;  Location: [facility];  Service: Endoscopy;  Laterality: N/A;   ESOPHAGOGASTRODUODENOSCOPY N/A 02/03/2013   Procedure: ESOPHAGOGASTRODUODENOSCOPY (EGD);  Surgeon: Juanita Craver, MD;  Location: [facility];  Service: Endoscopy;  Laterality: N/A;   ESOPHAGOGASTRODUODENOSCOPY N/A 10/12/2015   Procedure: ESOPHAGOGASTRODUODENOSCOPY (EGD);  Surgeon: Wonda Horner, MD;  Location: [facility];  Service: Endoscopy;  Laterality: N/A;   ESOPHAGOGASTRODUODENOSCOPY (EGD) WITH PROPOFOL N/A 01/18/2018   Procedure: ESOPHAGOGASTRODUODENOSCOPY (EGD) WITH PROPOFOL;  Surgeon: Wonda Horner, MD;  Location: [facility] ENDOSCOPY;  Service: Endoscopy;  Laterality: N/A;   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 She has a 15.00 pack-year smoking history. She has never used smokeless tobacco. She reports current alcohol use of about 2.0 standard drinks of alcohol per week. She reports that she does not use drugs.  No Known Allergies  Family History  Problem Relation Age of Onset   Cancer Maternal Grandmother        skin cancer     Prior to Admission medications   Medication Sig Start Date End Date Taking? Authorizing  Provider  acetaminophen (TYLENOL) 500 MG tablet Take 500-1,000 mg by mouth every 6 (six) hours as needed for mild pain or headache.    [provider]  bisacodyl 5 MG EC tablet Take 5 mg by mouth daily as needed for mild constipation or moderate constipation.    [provider]  diltiazem (CARDIZEM CD) 240 MG 24 hr capsule Take 1 capsule (240 mg total) by mouth daily. 04/30/22   Redwine, Madison A, PA-C  DULoxetine (CYMBALTA) 30 MG capsule Take 1 capsule (30 mg total) by mouth 2 (two) times daily. Patient taking differently: Take 30 mg by mouth daily. 03/14/19   Jean Rosenthal, MD  famotidine (PEPCID) 20 MG tablet Take 1 tablet (20 mg total) by mouth daily. 12/31/21   Larene Pickett, PA-C  hydrOXYzine (VISTARIL) 50 MG capsule Take 1 capsule (50 mg total) by mouth at bedtime. 03/14/19   Jean Rosenthal, MD  levETIRAcetam (KEPPRA) 500 MG tablet Take 1 tablet (500 mg total) by mouth 2 (two) times daily. 04/30/22 07/29/22  Redwine, Madison A, PA-C  levothyroxine (SYNTHROID) 50 MCG tablet Take 1.5 tablets (75 mcg total) by mouth daily before breakfast. 08/16/21 11/14/21  [place] Donnamarie Poag, DO  omeprazole (PRILOSEC) 20 MG capsule Take 20 mg by mouth daily. 01/05/19   [provider]  ondansetron (ZOFRAN) 4 MG tablet Take 1 tablet (4 mg total) by mouth daily as needed for nausea or vomiting. 08/16/21 08/16/22  [place] Eric J, DO  oseltamivir (TAMIFLU) 75 MG capsule Take 1 capsule (75 mg total) by mouth every 12 (twelve) hours. 04/30/22   Milton Ferguson, MD  sucralfate (CARAFATE) 1 g tablet Take 1 tablet (1 g total) by mouth 4 (four) times daily -  with meals and at bedtime. 12/31/21   Larene Pickett, PA-C  topiramate (TOPAMAX) 50 MG tablet Take 2 tablets (100 mg total) by mouth 2 (two) times daily. Patient not taking: Reported on 08/14/2021 03/14/19   Jean Rosenthal, MD  traZODone (DESYREL) 100 MG tablet Take 1 tablet (100 mg total) by mouth at bedtime. 03/14/19   Jean Rosenthal, MD    Physical  Exam: Vitals:   05/01/22 0030 05/01/22 0045 05/01/22 0115 05/01/22 0123  BP: (!) 160/116 (!) 156/117 (!) 159/114   Pulse: (!) 104 95 97   Resp: 18 18 (!) 22   Temp:    (!) 97.4 F (36.3 C)  TempSrc:    Axillary  SpO2: 100% 99% 100%   Weight:        Constitutional: NAD, no pallor or cyanosis  Eyes: PERTLA, lids and conjunctivae normal ENMT: Mucous membranes are dry. Posterior pharynx clear of any exudate or lesions.   Neck: supple, no masses  Respiratory:  no wheezing, no crackles. No accessory muscle use.  Cardiovascular: S1 & S2 heard, regular rate and rhythm. No extremity edema.  Abdomen: No distension, no tenderness, soft. Bowel sounds active.  Musculoskeletal: no clubbing / cyanosis. No joint deformity upper and lower extremities.   Skin: no  significant rashes, lesions, ulcers. Warm, dry, well-perfused. Neurologic: No gross facial asymmetry. No spontaneous eye opening or verbalization. Moving Rt side more than left.     Labs and Imaging on Admission: I have personally reviewed following labs and imaging studies  CBC: Recent Labs  Lab 04/30/22 0818 04/30/22 1617 05/01/22 0045  WBC 8.2  --  22.9*  NEUTROABS 7.7  --  18.4*  HGB 13.5 12.9 14.1  HCT 39.7 38.0 43.0  MCV 94.5  --  98.9  PLT 367  --  892   Basic Metabolic Panel: Recent Labs  Lab 04/30/22 0818 04/30/22 1617 05/01/22 0045  NA 134* 135 135  K 2.6* 3.7 3.1*  CL 95* 104 95*  CO2 28  --  30  GLUCOSE 151* 86 128*  BUN 7 4* 7  CREATININE 0.73 0.50 0.79  CALCIUM 8.8*  --  8.1*  MG  --   --  2.5*   GFR: Estimated Creatinine Clearance: 55.4 mL/min (by C-G formula based on SCr of 0.79 mg/dL). Liver Function Tests: Recent Labs  Lab 05/01/22 0045  AST 45*  ALT 17  ALKPHOS 111  BILITOT 1.3*  PROT 6.2*  ALBUMIN 3.2*   No results for input(s): "LIPASE", "AMYLASE" in the last 168 hours. No results for input(s): "AMMONIA" in the last 168 hours. Coagulation Profile: No results for input(s): "INR",  "PROTIME" in the last 168 hours. Cardiac Enzymes: No results for input(s): "CKTOTAL", "CKMB", "CKMBINDEX", "TROPONINI" in the last 168 hours. BNP (last 3 results) No results for input(s): "PROBNP" in the last 8760 hours. HbA1C: No results for input(s): "HGBA1C" in the last 72 hours. CBG: No results for input(s): "GLUCAP" in the last 168 hours. Lipid Profile: No results for input(s): "CHOL", "HDL", "LDLCALC", "TRIG", "CHOLHDL", "LDLDIRECT" in the last 72 hours. Thyroid Function Tests: No results for input(s): "TSH", "T4TOTAL", "FREET4", "T3FREE", "THYROIDAB" in the last 72 hours. Anemia Panel: No results for input(s): "VITAMINB12", "FOLATE", "FERRITIN", "TIBC", "IRON", "RETICCTPCT" in the last 72 hours. Urine analysis:    Component Value Date/Time   COLORURINE YELLOW 08/14/2021 1553   APPEARANCEUR HAZY (A) 08/14/2021 1553   LABSPEC >1.046 (H) 08/14/2021 1553   PHURINE 8.0 08/14/2021 1553   GLUCOSEU NEGATIVE 08/14/2021 1553   HGBUR NEGATIVE 08/14/2021 1553   BILIRUBINUR NEGATIVE 08/14/2021 1553   KETONESUR 20 (A) 08/14/2021 1553   PROTEINUR 30 (A) 08/14/2021 1553   UROBILINOGEN 0.2 12/06/2013 1718   NITRITE POSITIVE (A) 08/14/2021 1553   LEUKOCYTESUR SMALL (A) 08/14/2021 1553   Sepsis Labs: '@LABRCNTIP'$ (procalcitonin:4,lacticidven:4) ) Recent Results (from the past 240 hour(s))  Resp panel by RT-PCR (RSV, Flu A&B, Covid) Anterior Nasal Swab     Status: Abnormal   Collection Time: 04/29/22  8:02 PM   Specimen: Anterior Nasal Swab  Result Value Ref Range Status   SARS Coronavirus 2 by RT PCR NEGATIVE NEGATIVE Final    Comment: (NOTE) SARS-CoV-2 target nucleic acids are NOT DETECTED.  The SARS-CoV-2 RNA is generally detectable in upper respiratory specimens during the acute phase of infection. The lowest concentration of SARS-CoV-2 viral copies this assay can detect is 138 copies/mL. A negative result does not preclude SARS-Cov-2 infection and should not be used as the sole  basis for treatment or other patient management decisions. A negative result may occur with  improper specimen collection/handling, submission of specimen other than nasopharyngeal swab, presence of viral mutation(s) within the areas targeted by this assay, and inadequate number of viral copies(<138 copies/mL). A negative result must be combined  with clinical observations, patient history, and epidemiological information. The expected result is Negative.  Fact Sheet for Patients:  EntrepreneurPulse.com.au  Fact Sheet for Healthcare Providers:  IncredibleEmployment.be  This test is no t yet approved or cleared by the [place] FDA and  has been authorized for detection and/or diagnosis of SARS-CoV-2 by FDA under an Emergency Use Authorization (EUA). This EUA will remain  in effect (meaning this test can be used) for the duration of the COVID-19 declaration under Section 564(b)(1) of the Act, 21 U.S.C.section 360bbb-3(b)(1), unless the authorization is terminated  or revoked sooner.       Influenza A by PCR POSITIVE (A) NEGATIVE Final   Influenza B by PCR NEGATIVE NEGATIVE Final    Comment: (NOTE) The Xpert Xpress SARS-CoV-2/FLU/RSV plus assay is intended as an aid in the diagnosis of influenza from Nasopharyngeal swab specimens and should not be used as a sole basis for treatment. Nasal washings and aspirates are unacceptable for Xpert Xpress SARS-CoV-2/FLU/RSV testing.  Fact Sheet for Patients: EntrepreneurPulse.com.au  Fact Sheet for Healthcare Providers: IncredibleEmployment.be  This test is not yet approved or cleared by the [place] FDA and has been authorized for detection and/or diagnosis of SARS-CoV-2 by FDA under an Emergency Use Authorization (EUA). This EUA will remain in effect (meaning this test can be used) for the duration of the COVID-19 declaration under Section 564(b)(1) of the  Act, 21 U.S.C. section 360bbb-3(b)(1), unless the authorization is terminated or revoked.     Resp Syncytial Virus by PCR NEGATIVE NEGATIVE Final    Comment: (NOTE) Fact Sheet for Patients: EntrepreneurPulse.com.au  Fact Sheet for Healthcare Providers: IncredibleEmployment.be  This test is not yet approved or cleared by the [place] FDA and has been authorized for detection and/or diagnosis of SARS-CoV-2 by FDA under an Emergency Use Authorization (EUA). This EUA will remain in effect (meaning this test can be used) for the duration of the COVID-19 declaration under Section 564(b)(1) of the Act, 21 U.S.C. section 360bbb-3(b)(1), unless the authorization is terminated or revoked.  Performed at [facility], [place] [address] [place], [place] [address]      Radiological Exams on Admission: DG Chest Port 1 View  Result Date: 05/01/2022 CLINICAL DATA:  Seizure EXAM: PORTABLE CHEST 1 VIEW COMPARISON:  04/29/2022 FINDINGS: Unchanged cardiac and mediastinal contours. Chronic scattered calcified pulmonary nodules, likely sequela of prior granulomatous disease. No new focal pulmonary opacity. No pleural effusion or pneumothorax. Scoliosis. No acute osseous abnormality. IMPRESSION: No acute cardiopulmonary process. Electronically Signed   By: Merilyn Baba M.D.   On: 05/01/2022 00:18   CT Head Wo Contrast  Result Date: 04/30/2022 CLINICAL DATA:  Head trauma, abnormal mental status (Age 46-64y) EXAM: CT HEAD WITHOUT CONTRAST TECHNIQUE: Contiguous axial images were obtained from the base of the skull through the vertex without intravenous contrast. RADIATION DOSE REDUCTION: This exam was performed according to the departmental dose-optimization program which includes automated exposure control, adjustment of the mA and/or kV according to patient size and/or use of iterative reconstruction technique. COMPARISON:  CT head 09/11/2018  FINDINGS: Brain: No evidence of large-territorial acute infarction. No parenchymal hemorrhage. No mass lesion. No extra-axial collection. No mass effect or midline shift. No hydrocephalus. Basilar cisterns are patent. Vascular: No hyperdense vessel. Skull: No acute fracture or focal lesion. Sinuses/Orbits: Right maxillary sinus mucosal thickening. Otherwise paranasal sinuses and mastoid air cells are clear. The orbits are unremarkable. Other: None. IMPRESSION: No acute intracranial abnormality. Electronically Signed   By: Thomasena Edis  Mckinley Jewel M.D.   On: 04/30/2022 21:42   DG Chest 2 View  Result Date: 04/29/2022 CLINICAL DATA:  cough EXAM: CHEST - 2 VIEW COMPARISON:  Chest x-ray 12/30/2021 FINDINGS: The heart and mediastinal contours are within normal limits. Chronic multiple scattered calcified pulmonary nodules likely sequelae of prior granulomatous disease. No focal consolidation. No pulmonary edema. No pleural effusion. No pneumothorax. No acute osseous abnormality. Dextroscoliosis of the midthoracic spine. IMPRESSION: No active cardiopulmonary disease. Electronically Signed   By: Iven Finn M.D.   On: 04/29/2022 20:20    EKG: Independently reviewed. Sinus rhythm, 1st degree AV block.  Assessment/Plan  1. Status epilepticus - Patient with known seizure disorder was seen for flu like sxs, diagnosed with influenza A and then had multiple witnessed seizures and has not returned to baseline  - No acute findings on head CT  - Appreciate neurology consultation, planning for continuous EEG, complete Keppra load, continue Keppra 1 g BID   2. Influenza  - Diagnosed 12/18 after 2 days of symptoms  - Continue Tamiflu and droplet precautions    3. Acute hypoxic respiratory failure  - O2 saturations dropped to 70s while seizing and she is now requiring 3-5 Lpm supplemental O2  - CXR is clear - She is breathing spontaneously and protecting her airway  - Continue supplemental O2 as needed, consider  repeat chest imaging if continues to require supplemental O2 once she is more alert   4. Hypertensive urgency   - Use labetalol IVPs as needed for now, resume diltiazem once she can safely take oral medications    5. Hypokalemia  - Replacing    6. Hx of laryngeal cancer  - Cancer free for many years, discharged from oncology follow-up in 2020    DVT prophylaxis: Lovenox  Code Status: Full  Level of Care: Level of care: Progressive Family Communication: Daughter at bedside  Disposition Plan:  Patient is from: home  Anticipated d/c is to: TBD Anticipated d/c date is: 05/03/22  Patient currently: Pending resolution of seizures, correction of electrolytes, BP-control  Consults called: neurology  Admission status: Inpatient     Vianne Bulls, MD [facility]  05/01/2022, 2:26 AM

## 2022-05-01 NOTE — Procedures (Signed)
History: 46 year old female being evaluated for nonconvulsive status epilepticus  Sedation: Ativan given earlier in the evening  Technique: This EEG was acquired with electrodes placed according to the International 10-20 electrode system (including Fp1, Fp2, F3, F4, C3, C4, P3, P4, O1, O2, T3, T4, T5, T6, A1, A2, Fz, Cz, Pz). The following electrodes were missing or displaced: none.   Background: There is a poorly sustained posterior dominant rhythm that is better seen on the right than left and she has a frequency of 10 Hz.  There are lateralized periodic discharges with phase reversal at F7 and theta wave morphology which wax and wane without definite evolution throughout the recording.  There is also significant left hemispheric focal slow activity.  Photic stimulation: Physiologic driving is now performed  EEG Abnormalities: 1) lateralized periodic discharges with theta wave morphology  2) left hemispheric slow activity   Clinical Interpretation: This EEG recorded the pattern on the ictal-interictal continuum suggestive of a highly irritable area in the left hemisphere without definite seizure.  Recommend continuous EEG monitoring.  Roland Rack, MD [facility] [phone]  If 7pm- 7am, please page neurology on call as listed in [place].'

## 2022-05-01 NOTE — ED Notes (Signed)
Family @ [facility]

## 2022-05-01 NOTE — ED Notes (Signed)
ED TO INPATIENT HANDOFF REPORT  ED Nurse Name and Phone #: Josh  S Name/Age/Gender Crystal Spence 46 y.o. female Room/Bed: 023C/023C  Code Status   Code Status: Full Code  Home/SNF/Other Home PT alert but has incomprehensible speech at this time.   Is this baseline? Baseline she lives alone and is A&O x4.  Triage Complete: Triage complete  Chief Complaint Status epilepticus ([place]) [Q22.979]  Triage Note Pt has history of seizures and was witnessed to have a seizure approx 1.5 hour ago. Unknown if pt has missed doses of her meds. Pt does appear to be post-ictal at this time and nonverbal.    Allergies No Known Allergies  Level of Care/Admitting Diagnosis ED Disposition     ED Disposition  Admit   Condition  --   [place]: [place] [[id number]]  Level of Care: Progressive [102]  Admit to Progressive based on following criteria: NEUROLOGICAL AND NEUROSURGICAL complex patients with significant risk of instability, who do not meet ICU criteria, yet require close observation or frequent assessment (< / = every 2 - 4 hours) with medical / nursing intervention.  May admit patient to [facility] or [name] if equivalent level of care is available:: No  Covid Evaluation: Confirmed COVID Negative  Diagnosis: Status epilepticus [facility]) [[id number]]  Admitting Physician: Vianne Bulls [[id number]]  Attending Physician: Vianne Bulls [[id number]]  Certification:: I certify this patient will need inpatient services for at least 2 midnights  Estimated Length of Stay: 3          B Medical/Surgery History Past Medical History:  Diagnosis Date   Anemia, unspecified 06/17/2013   Headache    History of laryngeal cancer 02/03/2013   Hypertension    Hypothyroidism (acquired) 06/17/2013   Multiple lung nodules 06/28/2014   Pneumonia 01/14/2014, 04/2017   Poor oral hygiene 06/17/2013   Rib pain 01/14/2014   Rib pain on left side 01/05/2014   Throat cancer  [facility])    Past Surgical History:  Procedure Laterality Date   ABDOMINAL SURGERY     BIOPSY  01/18/2018   Procedure: BIOPSY;  Surgeon: Wonda Horner, MD;  Location: [place];  Service: Endoscopy;;   COLONOSCOPY WITH PROPOFOL N/A 10/12/2015   Procedure: COLONOSCOPY WITH PROPOFOL;  Surgeon: Wonda Horner, MD;  Location: [facility] ENDOSCOPY;  Service: Endoscopy;  Laterality: N/A;   ESOPHAGOGASTRODUODENOSCOPY N/A 02/03/2013   Procedure: ESOPHAGOGASTRODUODENOSCOPY (EGD);  Surgeon: Juanita Craver, MD;  Location: [facility] ENDOSCOPY;  Service: Endoscopy;  Laterality: N/A;   ESOPHAGOGASTRODUODENOSCOPY N/A 10/12/2015   Procedure: ESOPHAGOGASTRODUODENOSCOPY (EGD);  Surgeon: Wonda Horner, MD;  Location: [facility];  Service: Endoscopy;  Laterality: N/A;   ESOPHAGOGASTRODUODENOSCOPY (EGD) WITH PROPOFOL N/A 01/18/2018   Procedure: ESOPHAGOGASTRODUODENOSCOPY (EGD) WITH PROPOFOL;  Surgeon: Wonda Horner, MD;  Location: [facility] ENDOSCOPY;  Service: Endoscopy;  Laterality: N/A;   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   Procedure: TRACHEOCUTANEOUS FISTULA REPAIR;  Surgeon: Izora Gala, MD;  Location: [place];  Service: ENT;  Laterality: N/A;   TRACHEOSTOMY     for throat inflammation from radiation   TUBAL LIGATION       A IV Location/Drains/Wounds Patient Lines/Drains/Airways Status     Active Line/Drains/Airways     Name Placement date Placement time Site Days   Peripheral IV 04/30/22 20 G Anterior;Right Forearm 04/30/22  2052  Forearm  1   Peripheral IV 04/30/22 20 G Left Antecubital 04/30/22  2052  Antecubital  1  Intake/Output Last 24 hours  Intake/Output Summary (Last 24 hours) at 05/01/2022 1619 Last data filed at 05/01/2022 0710 Gross per 24 hour  Intake 1440.07 ml  Output --  Net 1440.07 ml    Labs/Imaging Results for orders placed or performed during the hospital encounter of 04/30/22 (from the past 48 hour(s))  hCG, serum, qualitative     Status: None   Collection Time: 04/30/22   8:53 PM  Result Value Ref Range   Preg, Serum NEGATIVE NEGATIVE    Comment:        THE SENSITIVITY OF THIS METHODOLOGY IS >10 mIU/mL. Performed at [facility], [address] [place], [place] [address]   Ethanol     Status: None   Collection Time: 04/30/22  8:54 PM  Result Value Ref Range   Alcohol, Ethyl (B) <10 <10 mg/dL    Comment: (NOTE) Lowest detectable limit for serum alcohol is 10 mg/dL.  For medical purposes only. Performed at [facility], [address] [place], [place] [address]   CBC with Differential     Status: Abnormal   Collection Time: 05/01/22 12:45 AM  Result Value Ref Range   WBC 22.9 (H) 4.0 - 10.5 K/uL   RBC 4.35 3.87 - 5.11 MIL/uL   Hemoglobin 14.1 12.0 - 15.0 g/dL   HCT 43.0 36.0 - 46.0 %   MCV 98.9 80.0 - 100.0 fL   MCH 32.4 26.0 - 34.0 pg   MCHC 32.8 30.0 - 36.0 g/dL   RDW 15.6 (H) 11.5 - 15.5 %   Platelets 355 150 - 400 K/uL   nRBC 0.0 0.0 - 0.2 %   Neutrophils Relative % 80 %   Neutro Abs 18.4 (H) 1.7 - 7.7 K/uL   Lymphocytes Relative 1 %   Lymphs Abs 0.3 (L) 0.7 - 4.0 K/uL   Monocytes Relative 5 %   Monocytes Absolute 1.1 (H) 0.1 - 1.0 K/uL   Eosinophils Relative 13 %   Eosinophils Absolute 3.0 (H) 0.0 - 0.5 K/uL   Basophils Relative 0 %   Basophils Absolute 0.0 0.0 - 0.1 K/uL   Immature Granulocytes 1 %   Abs Immature Granulocytes 0.12 (H) 0.00 - 0.07 K/uL    Comment: Performed at [facility], [address], [place], [place] [address]  Comprehensive metabolic panel     Status: Abnormal   Collection Time: 05/01/22 12:45 AM  Result Value Ref Range   Sodium 135 135 - 145 mmol/L   Potassium 3.1 (L) 3.5 - 5.1 mmol/L    Comment: HEMOLYSIS AT THIS LEVEL MAY AFFECT RESULT   Chloride 95 (L) 98 - 111 mmol/L   CO2 30 22 - 32 mmol/L   Glucose, Bld 128 (H) 70 - 99 mg/dL    Comment: Glucose reference range applies only to samples taken after fasting for at least 8 hours.   BUN 7 6 - 20 mg/dL    Creatinine, Ser 0.79 0.44 - 1.00 mg/dL   Calcium 8.1 (L) 8.9 - 10.3 mg/dL   Total Protein 6.2 (L) 6.5 - 8.1 g/dL   Albumin 3.2 (L) 3.5 - 5.0 g/dL   AST 45 (H) 15 - 41 U/L    Comment: HEMOLYSIS AT THIS LEVEL MAY AFFECT RESULT   ALT 17 0 - 44 U/L    Comment: HEMOLYSIS AT THIS LEVEL MAY AFFECT RESULT   Alkaline Phosphatase 111 38 - 126 U/L   Total Bilirubin 1.3 (H) 0.3 - 1.2 mg/dL    Comment:  HEMOLYSIS AT THIS LEVEL MAY AFFECT RESULT   GFR, Estimated >60 >60 mL/min    Comment: (NOTE) Calculated using the CKD-EPI Creatinine Equation (2021)    Anion gap 10 5 - 15    Comment: Performed at [facility], [place] [address], [place], [place] [address]  Magnesium     Status: Abnormal   Collection Time: 05/01/22 12:45 AM  Result Value Ref Range   Magnesium 2.5 (H) 1.7 - 2.4 mg/dL    Comment: Performed at [facility] [address] [place], [place] [address]  CBC     Status: Abnormal   Collection Time: 05/01/22  6:31 AM  Result Value Ref Range   WBC 17.2 (H) 4.0 - 10.5 K/uL   RBC 4.68 3.87 - 5.11 MIL/uL   Hemoglobin 15.4 (H) 12.0 - 15.0 g/dL   HCT 45.4 36.0 - 46.0 %   MCV 97.0 80.0 - 100.0 fL   MCH 32.9 26.0 - 34.0 pg   MCHC 33.9 30.0 - 36.0 g/dL   RDW 15.9 (H) 11.5 - 15.5 %   Platelets 306 150 - 400 K/uL   nRBC 0.0 0.0 - 0.2 %    Comment: Performed at [facility], [place] [address], [place], [place] [address]  Rapid urine drug screen (hospital performed)     Status: Abnormal   Collection Time: 05/01/22  1:31 PM  Result Value Ref Range   Opiates NONE DETECTED NONE DETECTED   Cocaine NONE DETECTED NONE DETECTED   Benzodiazepines NONE DETECTED NONE DETECTED   Amphetamines NONE DETECTED NONE DETECTED   Tetrahydrocannabinol POSITIVE (A) NONE DETECTED   Barbiturates NONE DETECTED NONE DETECTED    Comment: (NOTE) DRUG SCREEN FOR MEDICAL PURPOSES ONLY.  IF CONFIRMATION IS NEEDED FOR ANY PURPOSE, NOTIFY LAB WITHIN 5 DAYS.  LOWEST DETECTABLE LIMITS FOR URINE DRUG  SCREEN Drug Class                     Cutoff (ng/mL) Amphetamine and metabolites    1000 Barbiturate and metabolites    200 Benzodiazepine                 200 Opiates and metabolites        300 Cocaine and metabolites        300 THC                            50 Performed at [facility], [place] [address] [place], [place] [address]   Comprehensive metabolic panel     Status: Abnormal   Collection Time: 05/01/22  1:37 PM  Result Value Ref Range   Sodium 137 135 - 145 mmol/L   Potassium 3.1 (L) 3.5 - 5.1 mmol/L   Chloride 102 98 - 111 mmol/L   CO2 21 (L) 22 - 32 mmol/L   Glucose, Bld 77 70 - 99 mg/dL    Comment: Glucose reference range applies only to samples taken after fasting for at least 8 hours.   BUN 5 (L) 6 - 20 mg/dL   Creatinine, Ser 0.76 0.44 - 1.00 mg/dL   Calcium 7.4 (L) 8.9 - 10.3 mg/dL   Total Protein 5.5 (L) 6.5 - 8.1 g/dL   Albumin 2.7 (L) 3.5 - 5.0 g/dL   AST 29 15 - 41 U/L   ALT 14 0 - 44 U/L   Alkaline Phosphatase 95 38 - 126 U/L   Total  Bilirubin 1.0 0.3 - 1.2 mg/dL   GFR, Estimated >60 >60 mL/min    Comment: (NOTE) Calculated using the CKD-EPI Creatinine Equation (2021)    Anion gap 14 5 - 15    Comment: Performed at [facility] [address] [place], [place] [address]  Magnesium     Status: None   Collection Time: 05/01/22  1:37 PM  Result Value Ref Range   Magnesium 1.8 1.7 - 2.4 mg/dL    Comment: Performed at [facility] [address] [place], [place] [address]  Phosphorus     Status: Abnormal   Collection Time: 05/01/22  1:37 PM  Result Value Ref Range   Phosphorus 1.9 (L) 2.5 - 4.6 mg/dL    Comment: Performed at [facility] [address] [place], [place] [address]   Overnight EEG with video  Result Date: 05/01/2022 Lora Havens, MD     05/01/2022  8:19 AM Patient Name: Crystal Spence MRN: [id number] Epilepsy Attending: Lora Havens Referring Physician/Provider: Greta Doom, MD Duration:  05/01/2022 [phone] to [phone] Patient history: 46 year old female being evaluated for nonconvulsive status epilepticus. Level of alertness: Awake, asleep AEDs during EEG study: LEV, LCM Technical aspects: This EEG study was done with scalp electrodes positioned according to the 10-20 International system of electrode placement. Electrical activity was reviewed with band pass filter of 1-'70Hz'$ , sensitivity of 7 uV/mm, display speed of 22m/sec with a '60Hz'$  notched filter applied as appropriate. EEG data were recorded continuously and digitally stored.  Video monitoring was available and reviewed as appropriate. Description: During awake state, no clear posterior dominant rhythm was seen. Sleep was characterized by vertex waves, sleep spindles (12 to 14 Hz), maximal frontocentral region.  EEG showed continuous generalized and lateralized left hemisphere 3 to 5 Hz theta-delta slowing as well as 12 to 15 Hz beta activity seen predominantly in right hemisphere.  Sharp waves were noted in left hemisphere, maximal left posterior quadrant which appeared quasiperiodic at 0.5 to 1 Hz. Hyperventilation and photic stimulation were not performed.   ABNORMALITY - Sharp wave, left hemisphere, maximal left posterior quadrant - Continuous slow, generalized and lateralized left hemisphere IMPRESSION: This study showed evidence of epileptogenicity and cortical dysfunction arising from left hemisphere, maximal left posterior quadrant likely due to underlying structural abnormality.  Additionally there is moderate to severe diffuse encephalopathy, nonspecific to etiology.  No definite seizures were seen during the study. PLora Havens  EEG adult  Result Date: 05/01/2022 KGreta Doom MD     05/01/2022  2:44 AM History: 46year old female being evaluated for nonconvulsive status epilepticus Sedation: Ativan given earlier in the evening Technique: This EEG was acquired with electrodes placed according to the International 10-20  electrode system (including Fp1, Fp2, F3, F4, C3, C4, P3, P4, O1, O2, T3, T4, T5, T6, A1, A2, Fz, Cz, Pz). The following electrodes were missing or displaced: none. Background: There is a poorly sustained posterior dominant rhythm that is better seen on the right than left and she has a frequency of 10 Hz.  There are lateralized periodic discharges with phase reversal at F7 and theta wave morphology which wax and wane without definite evolution throughout the recording.  There is also significant left hemispheric focal slow activity. Photic stimulation: Physiologic driving is now performed EEG Abnormalities: 1) lateralized periodic discharges with theta wave morphology 2) left hemispheric slow activity Clinical Interpretation: This EEG recorded the pattern on the ictal-interictal continuum suggestive of  a highly irritable area in the left hemisphere without definite seizure.  Recommend continuous EEG monitoring. Roland Rack, MD [facility] [phone] If 7pm- 7am, please page neurology on call as listed in AMION.'  DG Chest Port 1 View  Result Date: 05/01/2022 CLINICAL DATA:  Seizure EXAM: PORTABLE CHEST 1 VIEW COMPARISON:  04/29/2022 FINDINGS: Unchanged cardiac and mediastinal contours. Chronic scattered calcified pulmonary nodules, likely sequela of prior granulomatous disease. No new focal pulmonary opacity. No pleural effusion or pneumothorax. Scoliosis. No acute osseous abnormality. IMPRESSION: No acute cardiopulmonary process. Electronically Signed   By: Merilyn Baba M.D.   On: 05/01/2022 00:18   CT Head Wo Contrast  Result Date: 04/30/2022 CLINICAL DATA:  Head trauma, abnormal mental status (Age 91-64y) EXAM: CT HEAD WITHOUT CONTRAST TECHNIQUE: Contiguous axial images were obtained from the base of the skull through the vertex without intravenous contrast. RADIATION DOSE REDUCTION: This exam was performed according to the departmental dose-optimization program which includes  automated exposure control, adjustment of the mA and/or kV according to patient size and/or use of iterative reconstruction technique. COMPARISON:  CT head 09/11/2018 FINDINGS: Brain: No evidence of large-territorial acute infarction. No parenchymal hemorrhage. No mass lesion. No extra-axial collection. No mass effect or midline shift. No hydrocephalus. Basilar cisterns are patent. Vascular: No hyperdense vessel. Skull: No acute fracture or focal lesion. Sinuses/Orbits: Right maxillary sinus mucosal thickening. Otherwise paranasal sinuses and mastoid air cells are clear. The orbits are unremarkable. Other: None. IMPRESSION: No acute intracranial abnormality. Electronically Signed   By: Iven Finn M.D.   On: 04/30/2022 21:42   DG Chest 2 View  Result Date: 04/29/2022 CLINICAL DATA:  cough EXAM: CHEST - 2 VIEW COMPARISON:  Chest x-ray 12/30/2021 FINDINGS: The heart and mediastinal contours are within normal limits. Chronic multiple scattered calcified pulmonary nodules likely sequelae of prior granulomatous disease. No focal consolidation. No pulmonary edema. No pleural effusion. No pneumothorax. No acute osseous abnormality. Dextroscoliosis of the midthoracic spine. IMPRESSION: No active cardiopulmonary disease. Electronically Signed   By: Iven Finn M.D.   On: 04/29/2022 20:20    Pending Labs Unresulted Labs (From admission, onward)     Start     Ordered   05/08/22 0500  Creatinine, serum  (enoxaparin (LOVENOX)    CrCl >/= 30 ml/min)  Weekly,   R     Comments: while on enoxaparin therapy    05/01/22 0202   05/01/22 1151  Vitamin B1  Once,   R        05/01/22 1150   05/01/22 0500  Comprehensive metabolic panel  Daily,   R      05/01/22 0202   05/01/22 0500  CBC  Daily,   R      05/01/22 0202            Vitals/Pain Today's Vitals   05/01/22 1430 05/01/22 1445 05/01/22 1500 05/01/22 1539  BP: 108/89 (!) 123/98 (!) 134/113   Pulse:      Resp: (!) 29 (!) 25 (!) 32   Temp:     (!) 97.5 F (36.4 C)  TempSrc:      SpO2:      Weight:      PainSc:        Isolation Precautions Droplet precaution  Medications Medications  oseltamivir (TAMIFLU) capsule 30 mg (0 mg Oral Hold 05/01/22 1318)  diltiazem (CARDIZEM CD) 24 hr capsule 240 mg (240 mg Oral Not Given 05/01/22 1319)  levothyroxine (SYNTHROID) tablet 75 mcg (0 mcg Oral Hold  05/01/22 0718)  pantoprazole (PROTONIX) EC tablet 40 mg (40 mg Oral Not Given 05/01/22 1319)  enoxaparin (LOVENOX) 100 mg/mL injection 20 mg (20 mg Subcutaneous Given 05/01/22 1234)  sodium chloride flush (NS) 0.9 % injection 3 mL (3 mLs Intravenous Given 05/01/22 1317)  acetaminophen (TYLENOL) tablet 650 mg (has no administration in time range)    Or  acetaminophen (TYLENOL) suppository 650 mg (has no administration in time range)  0.9 %  sodium chloride infusion ( Intravenous New Bag/Given 05/01/22 1315)  senna-docusate (Senokot-S) tablet 1 tablet (has no administration in time range)  ondansetron (ZOFRAN) tablet 4 mg (has no administration in time range)    Or  ondansetron (ZOFRAN) injection 4 mg (has no administration in time range)  labetalol (NORMODYNE) injection 10 mg (has no administration in time range)  levETIRAcetam (KEPPRA) IVPB 1000 mg/100 mL premix (0 mg Intravenous Stopped 05/01/22 1318)  lacosamide (VIMPAT) 100 mg in sodium chloride 0.9 % 25 mL IVPB (0 mg Intravenous Stopped 05/01/22 1318)  PHENObarbital (LUMINAL) 1,000 mg in sodium chloride 0.9 % 100 mL IVPB (0 mg Intravenous Stopped 05/01/22 1424)    Followed by  PHENObarbital (LUMINAL) injection 65 mg (has no administration in time range)  LORazepam (ATIVAN) injection 2 mg (has no administration in time range)  thiamine (VITAMIN B1) injection 100 mg (100 mg Intravenous Given 05/01/22 1234)  magnesium sulfate (IV Push/IM) injection 2 g (2 g Intravenous Given 04/30/22 2106)  lactated ringers bolus 1,000 mL (0 mLs Intravenous Stopped 04/30/22 2241)  levETIRAcetam  (KEPPRA) IVPB 1000 mg/100 mL premix (0 mg Intravenous Stopped 04/30/22 2145)    And  levETIRAcetam (KEPPRA) IVPB 500 mg/100 mL premix (0 mg Intravenous Stopped 04/30/22 2145)  LORazepam (ATIVAN) injection 2 mg (2 mg Intravenous Given 04/30/22 2115)  LORazepam (ATIVAN) injection 1 mg (1 mg Intravenous Given 05/01/22 0035)  potassium chloride 10 mEq in 100 mL IVPB (0 mEq Intravenous Stopped 05/01/22 0825)  levETIRAcetam (KEPPRA) IVPB 1500 mg/ 100 mL premix (0 mg Intravenous Stopped 05/01/22 0345)  lacosamide (VIMPAT) 200 mg in sodium chloride 0.9 % 25 mL IVPB (0 mg Intravenous Stopped 05/01/22 0419)    Mobility non-ambulatory High fall risk   Focused Assessments    R Recommendations: See Admitting Provider Note  Report given to:   Additional Notes:

## 2022-05-01 NOTE — Progress Notes (Addendum)
Patient admitted early this morning for seizures has been started on Keppra and LTM EEG as per neurology recommendations.  Patient seen and examined at bedside and plan of care discussed with her.  I have reviewed patient's medical records including this morning's H&P, current vitals, labs, medications myself.

## 2022-05-01 NOTE — Progress Notes (Signed)
SLP Cancellation Note  Patient Details Name: Crystal Spence MRN: [id number] DOB: 11/28/1975   Cancelled treatment:       Reason Eval/Treat Not Completed: Patient's level of consciousness. SLP will f/u next date   Sonia Baller, MA, CCC-SLP Speech Therapy

## 2022-05-01 NOTE — Progress Notes (Signed)
Subjective: No clinical seizures.  Per daughter and niece at bedside, patient is looking more awake and interacting more now but still having trouble with communication.  Family also reports patient drinks beer but unable to tell me the quantity.  ROS: Unable to obtain due to poor mental status  Examination  Vital signs in last 24 hours: Temp:  [97.4 F (36.3 C)-98.6 F (37 C)] 97.4 F (36.3 C) (12/20 1029) Pulse Rate:  [86-132] 117 (12/20 1145) Resp:  [7-34] 17 (12/20 1145) BP: (135-253)/(102-175) 151/102 (12/20 1145) SpO2:  [73 %-100 %] 96 % (12/20 1145) Weight:  [40.8 kg] 40.8 kg (12/19 2029)  General: lying in bed, NAD Neuro: Awake, alert, looks at examiner and is able to track examiner in room but does have right gaze preference, PERRLA, EOMI, unable to name objects, unable to follow commands, moves all 4 extremities with antigravity strength but appears to have right hemiparesis.  Basic Metabolic Panel: Recent Labs  Lab 04/30/22 0818 04/30/22 1617 05/01/22 0045  NA 134* 135 135  K 2.6* 3.7 3.1*  CL 95* 104 95*  CO2 28  --  30  GLUCOSE 151* 86 128*  BUN 7 4* 7  CREATININE 0.73 0.50 0.79  CALCIUM 8.8*  --  8.1*  MG  --   --  2.5*   CBC: Recent Labs  Lab 04/30/22 0818 04/30/22 1617 05/01/22 0045 05/01/22 0631  WBC 8.2  --  22.9* 17.2*  NEUTROABS 7.7  --  18.4*  --   HGB 13.5 12.9 14.1 15.4*  HCT 39.7 38.0 43.0 45.4  MCV 94.5  --  98.9 97.0  PLT 367  --  355 306   Coagulation Studies: No results for input(s): "LABPROT", "INR" in the last 72 hours.  Imaging CT head without contrast 04/30/2022: No acute intracranial abnormality.  ASSESSMENT AND PLAN: 46 year old female with recurrent seizures in the setting of flu and potential alcohol use/withdrawal (alcohol level less than 10 on arrival).  Status epilepticus, resolved Epilepsy Alcohol use disorder with alcohol withdrawal -Seizures likely due to fluid and alcohol use  Recommendations -As patient  continues to have right gaze preference and still aphasic, will give 1000 mg IV phenobarbital once and start 65 mg daily from tomorrow -Continue Keppra 1000 mg twice daily and Vimpat 100 mg twice daily -Will check thiamine level and start IV thiamine -Start CIWA protocol -Continue LTM EEG -Continue seizure precautions -As needed IV Ativan 2 mg for clinical seizure-like activity -Management of rest of comorbidities per primary team -Discussed plan with family at bedside as well as Dr. Starla Link via secure chat  CRITICAL CARE Performed by: Lora Havens   Total critical care time: 32 minutes  Critical care time was exclusive of separately billable procedures and treating other patients.  Critical care was necessary to treat or prevent imminent or life-threatening deterioration.  Critical care was time spent personally by me on the following activities: development of treatment plan with patient and/or surrogate as well as nursing, discussions with consultants, evaluation of patient's response to treatment, examination of patient, obtaining history from patient or surrogate, ordering and performing treatments and interventions, ordering and review of laboratory studies, ordering and review of radiographic studies, pulse oximetry and re-evaluation of patient's condition.   Zeb Comfort Epilepsy [facility] For questions after 5pm please refer to AMION to reach the Neurologist on call

## 2022-05-01 NOTE — Significant Event (Signed)
Rapid Response Event Note   Reason for Call :  Unresponsiveness, posturing.  Per dayshift RN, pt arrived from the ED confused/groggy. She would open her eyes and move all extremities to pain.   Around 1957, pt was found unresponsive and posturing per RN.  Initial Focused Assessment:  Pt lying in bed with eyes closed, in no visible distress. Pt will move BLE to pain and is moving BUE spontaneously. After a few minutes, pt able to open eyes, answer simple questions, and follow commands in all extremities, however, still groggy/lethargic. R pupil 3 and sluggish, L pupil 2 and sluggish. Skin warm and dry.  T-97.6, HR-108, BP-126/9, RR-18, SpO2-93% on 2L [place].  EEG tech in room to reconnect continuous EEG machine since pt moved from ED to 3W.   Interventions:  CBG-74  Plan of Care:  Pt neuro status better than when she arrived to 3W. Continuous EEG machine now working. Continue to monitor pt closely. Call RRT if further assistance needed.   Event Summary:   MD Notified: Dr. Leonel Ramsay, bedside RN to notify [facility] MD of happenings.  Call [place] [phone]  Dillard Essex, RN

## 2022-05-01 NOTE — Procedures (Signed)
Patient Name: Crystal Spence  MRN: [id number]  Epilepsy Attending: Lora Havens  Referring Physician/Provider: Greta Doom, MD  Duration: 05/01/2022 0600 to 05/02/2022 0233  Patient history: 46 year old female being evaluated for nonconvulsive status epilepticus.  Level of alertness: Awake, asleep  AEDs during EEG study: LEV, LCM  Technical aspects: This EEG study was done with scalp electrodes positioned according to the 10-20 International system of electrode placement. Electrical activity was reviewed with band pass filter of 1-'70Hz'$ , sensitivity of 7 uV/mm, display speed of 75m/sec with a '60Hz'$  notched filter applied as appropriate. EEG data were recorded continuously and digitally stored.  Video monitoring was available and reviewed as appropriate.  Description: During awake state, no clear posterior dominant rhythm was seen. Sleep was characterized by vertex waves, sleep spindles (12 to 14 Hz), maximal frontocentral region.  EEG showed continuous generalized and lateralized left hemisphere 3 to 5 Hz theta-delta slowing as well as 12 to 15 Hz beta activity seen predominantly in right hemisphere.  Sharp waves were noted in left hemisphere, maximal left posterior quadrant which appeared quasiperiodic at 0.5 to 1 Hz. Hyperventilation and photic stimulation were not performed.     ABNORMALITY - Sharp wave, left hemisphere, maximal left posterior quadrant - Continuous slow, generalized and lateralized left hemisphere  IMPRESSION: This study showed evidence of epileptogenicity and cortical dysfunction arising from left hemisphere, maximal left posterior quadrant likely due to underlying structural abnormality.  Additionally there is moderate to severe diffuse encephalopathy, nonspecific to etiology.  No definite seizures were seen during the study.  Selyna Klahn OBarbra Sarks

## 2022-05-01 NOTE — Progress Notes (Signed)
LTM EEG hooked up and running , pt is in the ER- no initial skin breakdown - push button tested -

## 2022-05-02 ENCOUNTER — Other Ambulatory Visit: Payer: Self-pay

## 2022-05-02 DIAGNOSIS — Z8521 Personal history of malignant neoplasm of larynx: Secondary | ICD-10-CM | POA: Diagnosis not present

## 2022-05-02 DIAGNOSIS — G40901 Epilepsy, unspecified, not intractable, with status epilepticus: Secondary | ICD-10-CM | POA: Diagnosis not present

## 2022-05-02 DIAGNOSIS — J9601 Acute respiratory failure with hypoxia: Secondary | ICD-10-CM | POA: Diagnosis not present

## 2022-05-02 DIAGNOSIS — J101 Influenza due to other identified influenza virus with other respiratory manifestations: Secondary | ICD-10-CM | POA: Diagnosis not present

## 2022-05-02 LAB — GLUCOSE, CAPILLARY
Glucose-Capillary: 100 mg/dL — ABNORMAL HIGH (ref 70–99)
Glucose-Capillary: 106 mg/dL — ABNORMAL HIGH (ref 70–99)
Glucose-Capillary: 111 mg/dL — ABNORMAL HIGH (ref 70–99)
Glucose-Capillary: 115 mg/dL — ABNORMAL HIGH (ref 70–99)
Glucose-Capillary: 77 mg/dL (ref 70–99)
Glucose-Capillary: 88 mg/dL (ref 70–99)
Glucose-Capillary: 89 mg/dL (ref 70–99)

## 2022-05-02 LAB — MAGNESIUM: Magnesium: 2 mg/dL (ref 1.7–2.4)

## 2022-05-02 LAB — COMPREHENSIVE METABOLIC PANEL
ALT: 13 U/L (ref 0–44)
AST: 23 U/L (ref 15–41)
Albumin: 2.5 g/dL — ABNORMAL LOW (ref 3.5–5.0)
Alkaline Phosphatase: 87 U/L (ref 38–126)
Anion gap: 14 (ref 5–15)
BUN: 7 mg/dL (ref 6–20)
CO2: 20 mmol/L — ABNORMAL LOW (ref 22–32)
Calcium: 7.5 mg/dL — ABNORMAL LOW (ref 8.9–10.3)
Chloride: 102 mmol/L (ref 98–111)
Creatinine, Ser: 1 mg/dL (ref 0.44–1.00)
GFR, Estimated: >60 mL/min (ref >60)
Glucose, Bld: 85 mg/dL (ref 70–99)
Potassium: 2.9 mmol/L — ABNORMAL LOW (ref 3.5–5.1)
Sodium: 136 mmol/L (ref 135–145)
Total Bilirubin: 1.2 mg/dL (ref 0.3–1.2)
Total Protein: 5.7 g/dL — ABNORMAL LOW (ref 6.5–8.1)

## 2022-05-02 LAB — CBC
HCT: 54 % — ABNORMAL HIGH (ref 36.0–46.0)
Hemoglobin: 17.9 g/dL — ABNORMAL HIGH (ref 12.0–15.0)
MCH: 32.3 pg (ref 26.0–34.0)
MCHC: 33.1 g/dL (ref 30.0–36.0)
MCV: 97.3 fL (ref 80.0–100.0)
Platelets: 233 10*3/uL (ref 150–400)
RBC: 5.55 MIL/uL — ABNORMAL HIGH (ref 3.87–5.11)
RDW: 16.7 % — ABNORMAL HIGH (ref 11.5–15.5)
WBC: 18.4 10*3/uL — ABNORMAL HIGH (ref 4.0–10.5)
nRBC: 0 % (ref 0.0–0.2)

## 2022-05-02 MED ORDER — PHENOBARBITAL SODIUM 65 MG/ML IJ SOLN
65.0000 mg | Freq: Once | INTRAMUSCULAR | Status: AC
  Administered 2022-05-02: 65 mg via INTRAVENOUS
  Filled 2022-05-02: qty 1

## 2022-05-02 MED ORDER — PHENOBARBITAL SODIUM 130 MG/ML IJ SOLN: 130.0000 mg | Freq: Every day | INTRAMUSCULAR | Status: DC

## 2022-05-02 MED ORDER — POTASSIUM CHLORIDE 10 MEQ/100ML IV SOLN
10.0000 meq | INTRAVENOUS | Status: AC
  Administered 2022-05-02 (×4): 10 meq via INTRAVENOUS
  Filled 2022-05-02 (×4): qty 100

## 2022-05-02 MED ORDER — KCL IN DEXTROSE-NACL 40-5-0.9 MEQ/L-%-% IV SOLN
INTRAVENOUS | Status: AC
  Filled 2022-05-02 (×6): qty 1000

## 2022-05-02 NOTE — Procedures (Addendum)
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   Level of alertness: Awake, asleep   AEDs during EEG study: LEV, LCM, Phenobarb   Technical aspects: This EEG study was done with scalp electrodes positioned according to the 10-20 International system of electrode placement. Electrical activity was reviewed with band pass filter of 1-'70Hz'$ , sensitivity of 7 uV/mm, display speed of 6m/sec with a '60Hz'$  notched filter applied as appropriate. EEG data were recorded continuously and digitally stored.  Video monitoring was available and reviewed as appropriate.   Description: During awake state, no clear posterior dominant rhythm was seen. Sleep was characterized by vertex waves, sleep spindles (12 to 14 Hz), maximal frontocentral region.  EEG showed continuous generalized and lateralized left hemisphere 3 to 5 Hz theta-delta slowing as well as 12 to 15 Hz beta activity seen predominantly in right hemisphere.  Sharp waves were noted in left hemisphere, maximal left fronto-central region which appeared quasiperiodic at 0.5 to 1 Hz. Hyperventilation and photic stimulation were not performed.      ABNORMALITY - Sharp wave, left hemisphere, maximal left fronto-central region - Continuous slow, generalized and lateralized left hemisphere   IMPRESSION: This study showed evidence of epileptogenicity and cortical dysfunction arising from left hemisphere, maximal left fronto-central region likely due to underlying structural abnormality. Additionally there is moderate to severe diffuse encephalopathy, nonspecific to etiology.  No definite seizures were seen during the study.   Kemia Wendel OBarbra Sarks

## 2022-05-02 NOTE — Progress Notes (Signed)
Subjective: Continues to be aphasic. No clinical seizures.  ROS: Unable to obtain due to poor mental status  Examination  Vital signs in last 24 hours: Temp:  [97.5 F (36.4 C)-97.8 F (36.6 C)] 97.6 F (36.4 C) (12/21 0748) Pulse Rate:  [84-117] 94 (12/21 0748) Resp:  [10-32] 18 (12/21 0748) BP: (87-152)/(69-115) 108/88 (12/21 0748) SpO2:  [90 %-100 %] 96 % (12/21 0748) Weight:  [40 kg] 40 kg (12/21 0448)  General: lying in bed, NAD Neuro: Awake, alert, looks at examiner and is able to track examiner in room but does have right gaze preference and neglecting left side, PERRLA, EOMI, unable to name objects, unable to follow commands, moves all 4 extremities with antigravity strength but appears to have right hemiparesis.  Basic Metabolic Panel: Recent Labs  Lab 04/30/22 0818 04/30/22 1617 05/01/22 0045 05/01/22 1337 05/02/22 0728  NA 134* 135 135 137 136  K 2.6* 3.7 3.1* 3.1* 2.9*  CL 95* 104 95* 102 102  CO2 28  --  30 21* 20*  GLUCOSE 151* 86 128* 77 85  BUN 7 4* 7 5* 7  CREATININE 0.73 0.50 0.79 0.76 1.00  CALCIUM 8.8*  --  8.1* 7.4* 7.5*  MG  --   --  2.5* 1.8  --   PHOS  --   --   --  1.9*  --     CBC: Recent Labs  Lab 04/30/22 0818 04/30/22 1617 05/01/22 0045 05/01/22 0631 05/02/22 0508  WBC 8.2  --  22.9* 17.2* 18.4*  NEUTROABS 7.7  --  18.4*  --   --   HGB 13.5 12.9 14.1 15.4* 17.9*  HCT 39.7 38.0 43.0 45.4 54.0*  MCV 94.5  --  98.9 97.0 97.3  PLT 367  --  355 306 233    Coagulation Studies: No results for input(s): "LABPROT", "INR" in the last 72 hours.  Imaging NO new imaging  ASSESSMENT AND PLAN: 46 year old female with recurrent seizures in the setting of flu and potential alcohol use/withdrawal (alcohol level less than 10 on arrival).   Status epilepticus, resolved Epilepsy Alcohol use disorder with alcohol withdrawal -Seizures likely due to flu and alcohol use   Recommendations - Increase phenobarb to '130mg'$  daily. Will give an  additional '65mg'$  dose today as patient already got '65mg'$  this morning -Continue Keppra 1000 mg twice daily and Vimpat 100 mg twice daily -Continue CIWA protocol -Continue LTM EEG  -Plan for mri brain tomorrow -Continue seizure precautions -As needed IV Ativan 2 mg for clinical seizure-like activity -Management of rest of comorbidities per primary team -Discussed plan with RN at bedside as well as Dr. Starla Link via secure chat   I have spent a total of  36 minutes with the patient reviewing hospital notes,  test results, labs and examining the patient as well as establishing an assessment and plan that was discussed personally with the patient.  > 50% of time was spent in direct patient care.    Zeb Comfort Epilepsy [facility] For questions after 5pm please refer to AMION to reach the Neurologist on call

## 2022-05-02 NOTE — Progress Notes (Signed)
While doing rounds pt was noted to be nonresponsive arms/hands turned inward the body, and rigid. VSS taken and stable. Rapid response was called. SBG=74. Hospitalist was made aware and ordered 12.5g dextrose which was held because blood sugar went to 91. This morning CBG was 100. BP high this morning labetalol '10mg'$  IVP was given. Repeat BP WNL.Pt became more alert during the shift.

## 2022-05-02 NOTE — Progress Notes (Signed)
PROGRESS NOTE    KALILA ADKISON  EGB:[id number] DOB: 1975-11-05 DOA: 04/30/2022 PCP: Trey Sailors, PA   Brief Narrative:  46 y.o. 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  CT of the head was negative for acute abnormality.  She was treated with IV Ativan and Keppra.  Neurology was consulted and patient was transferred to [facility] and started on EEG.  Assessment & Plan:   Status epilepticus -Neurology following: Currently on Vimpat and Keppra along with phenobarbital.  LTM EEG ongoing. -Monitor mental status.  Fall precautions.  Seizures precautions.  Influenza A  bronchitis -Diagnosed on 04/29/2022 -Continue Tamiflu and droplet precautions  Acute respiratory failure with hypoxia -Possibly from above.  Initially required supplemental oxygen up to 5 L/min; currently needing up to 2 L/min intermittently.  Hypertensive urgency -Blood pressure has much improved.  Currently on diltiazem  Leukocytosis -Possibly reactive.  Monitor  Hypothyroidism -Continue levothyroxine  Hypokalemia -Replace.  Repeat a.m. labs.  Check magnesium level today  History of laryngeal cancer -Cancer free for many years, discharged from oncology follow-up in 2020     DVT prophylaxis: Lovenox Code Status: Full Family Communication: Daughter and niece at bedside Disposition Plan: Status is: Inpatient Remains inpatient appropriate because: Of severity of illness    Consultants: Neurology  Procedures: LTM EEG  Antimicrobials: None   Subjective: Patient seen and examined at bedside.  Undergoing LTM EEG.  Sleepy, wakes up only very slightly, hardly answers any questions.  Daughter at bedside states that patient was awake  earlier and talking.  No fever, vomiting reported  Objective: Vitals:   05/02/22 0443 05/02/22 0448 05/02/22 0748 05/02/22 1018  BP: 101/82  108/88   Pulse: 84  94   Resp:   18   Temp:   97.6 F (36.4 C)   TempSrc:   Oral   SpO2:   96%   Weight:  40 kg    Height:    '5\' 1"'$  (1.549 m)    Intake/Output Summary (Last 24 hours) at 05/02/2022 1026 Last data filed at 05/02/2022 0442 Gross per 24 hour  Intake 3 ml  Output 300 ml  Net -297 ml   Filed Weights   04/30/22 2029 05/02/22 0448  Weight: 40.8 kg 40 kg    Examination:  General exam: Appears calm and comfortable.  Looks chronically ill and deconditioned.  Currently requiring 2 L oxygen via nasal cannula intermittently. Respiratory system: Bilateral decreased breath sounds at bases with scattered crackles Cardiovascular system: S1 & S2 heard, Rate controlled Gastrointestinal system: Abdomen is nondistended, soft and nontender. Normal bowel sounds heard. Extremities: No cyanosis, clubbing, edema  Central nervous system: Sleepy, wakes up only very slightly, hardly answers any questions.  No focal neurological deficits. Moving extremities Skin: No rashes, lesions or ulcers Psychiatry: Could not be assessed because of mental status.     Data Reviewed: I have personally reviewed following labs and imaging studies  CBC: Recent Labs  Lab 04/30/22 0818 04/30/22 1617 05/01/22 0045 05/01/22 0631 05/02/22 0508  WBC 8.2  --  22.9* 17.2* 18.4*  NEUTROABS 7.7  --  18.4*  --   --   HGB 13.5 12.9 14.1 15.4* 17.9*  HCT 39.7 38.0 43.0 45.4 54.0*  MCV 94.5  --  98.9 97.0  97.3  PLT 367  --  355 306 272   Basic Metabolic Panel: Recent Labs  Lab 04/30/22 0818 04/30/22 1617 05/01/22 0045 05/01/22 1337 05/02/22 0728  NA 134* 135 135 137 136  K 2.6* 3.7 3.1* 3.1* 2.9*  CL 95* 104 95* 102 102  CO2 28  --  30 21* 20*  GLUCOSE 151* 86 128* 77 85  BUN 7 4* 7 5* 7  CREATININE 0.73 0.50 0.79 0.76 1.00  CALCIUM 8.8*  --  8.1*  7.4* 7.5*  MG  --   --  2.5* 1.8  --   PHOS  --   --   --  1.9*  --    GFR: Estimated Creatinine Clearance: 43.4 mL/min (by C-G formula based on SCr of 1 mg/dL). Liver Function Tests: Recent Labs  Lab 05/01/22 0045 05/01/22 1337 05/02/22 0728  AST 45* 29 23  ALT '17 14 13  '$ ALKPHOS 111 95 87  BILITOT 1.3* 1.0 1.2  PROT 6.2* 5.5* 5.7*  ALBUMIN 3.2* 2.7* 2.5*   No results for input(s): "LIPASE", "AMYLASE" in the last 168 hours. No results for input(s): "AMMONIA" in the last 168 hours. Coagulation Profile: No results for input(s): "INR", "PROTIME" in the last 168 hours. Cardiac Enzymes: No results for input(s): "CKTOTAL", "CKMB", "CKMBINDEX", "TROPONINI" in the last 168 hours. BNP (last 3 results) No results for input(s): "PROBNP" in the last 8760 hours. HbA1C: No results for input(s): "HGBA1C" in the last 72 hours. CBG: Recent Labs  Lab 05/01/22 2010 05/01/22 2211 05/02/22 0134 05/02/22 0417 05/02/22 0847  GLUCAP 74 91 77 100* 89   Lipid Profile: No results for input(s): "CHOL", "HDL", "LDLCALC", "TRIG", "CHOLHDL", "LDLDIRECT" in the last 72 hours. Thyroid Function Tests: No results for input(s): "TSH", "T4TOTAL", "FREET4", "T3FREE", "THYROIDAB" in the last 72 hours. Anemia Panel: No results for input(s): "VITAMINB12", "FOLATE", "FERRITIN", "TIBC", "IRON", "RETICCTPCT" in the last 72 hours. Sepsis Labs: No results for input(s): "PROCALCITON", "LATICACIDVEN" in the last 168 hours.  Recent Results (from the past 240 hour(s))  Resp panel by RT-PCR (RSV, Flu A&B, Covid) Anterior Nasal Swab     Status: Abnormal   Collection Time: 04/29/22  8:02 PM   Specimen: Anterior Nasal Swab  Result Value Ref Range Status   SARS Coronavirus 2 by RT PCR NEGATIVE NEGATIVE Final    Comment: (NOTE) SARS-CoV-2 target nucleic acids are NOT DETECTED.  The SARS-CoV-2 RNA is generally detectable in upper respiratory specimens during the acute phase of infection. The lowest concentration  of SARS-CoV-2 viral copies this assay can detect is 138 copies/mL. A negative result does not preclude SARS-Cov-2 infection and should not be used as the sole basis for treatment or other patient management decisions. A negative result may occur with  improper specimen collection/handling, submission of specimen other than nasopharyngeal swab, presence of viral mutation(s) within the areas targeted by this assay, and inadequate number of viral copies(<138 copies/mL). A negative result must be combined with clinical observations, patient history, and epidemiological information. The expected result is Negative.  Fact Sheet for Patients:  EntrepreneurPulse.com.au  Fact Sheet for Healthcare Providers:  IncredibleEmployment.be  This test is no t yet approved or cleared by the [place] FDA and  has been authorized for detection and/or diagnosis of SARS-CoV-2 by FDA under an Emergency Use Authorization (EUA). This EUA will remain  in effect (meaning this test can be used) for the duration of the COVID-19 declaration under Section 564(b)(1) of the Act, 21 U.S.C.section 360bbb-3(b)(1), unless  the authorization is terminated  or revoked sooner.       Influenza A by PCR POSITIVE (A) NEGATIVE Final   Influenza B by PCR NEGATIVE NEGATIVE Final    Comment: (NOTE) The Xpert Xpress SARS-CoV-2/FLU/RSV plus assay is intended as an aid in the diagnosis of influenza from Nasopharyngeal swab specimens and should not be used as a sole basis for treatment. Nasal washings and aspirates are unacceptable for Xpert Xpress SARS-CoV-2/FLU/RSV testing.  Fact Sheet for Patients: EntrepreneurPulse.com.au  Fact Sheet for Healthcare Providers: IncredibleEmployment.be  This test is not yet approved or cleared by the [place] FDA and has been authorized for detection and/or diagnosis of SARS-CoV-2 by FDA under an Emergency Use  Authorization (EUA). This EUA will remain in effect (meaning this test can be used) for the duration of the COVID-19 declaration under Section 564(b)(1) of the Act, 21 U.S.C. section 360bbb-3(b)(1), unless the authorization is terminated or revoked.     Resp Syncytial Virus by PCR NEGATIVE NEGATIVE Final    Comment: (NOTE) Fact Sheet for Patients: EntrepreneurPulse.com.au  Fact Sheet for Healthcare Providers: IncredibleEmployment.be  This test is not yet approved or cleared by the [place] FDA and has been authorized for detection and/or diagnosis of SARS-CoV-2 by FDA under an Emergency Use Authorization (EUA). This EUA will remain in effect (meaning this test can be used) for the duration of the COVID-19 declaration under Section 564(b)(1) of the Act, 21 U.S.C. section 360bbb-3(b)(1), unless the authorization is terminated or revoked.  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 Level of alertness: Awake, asleep AEDs during EEG study: LEV, LCM Technical aspects: This EEG study was done with scalp electrodes positioned according to the 10-20 International system of electrode placement. Electrical activity was reviewed with band pass filter of 1-'70Hz'$ , sensitivity of 7 uV/mm, display speed of 74m/sec with a '60Hz'$  notched filter applied as appropriate. EEG data were recorded continuously and digitally stored.  Video monitoring was available and reviewed as appropriate. Description: During awake state, no  clear posterior dominant rhythm was seen. Sleep was characterized by vertex waves, sleep spindles (12 to 14 Hz), maximal frontocentral region.  EEG showed continuous generalized and lateralized left hemisphere 3 to 5 Hz theta-delta slowing as well as 12 to 15 Hz beta activity seen predominantly in right hemisphere.  Sharp waves were noted in left hemisphere, maximal left posterior quadrant which appeared quasiperiodic at 0.5 to 1 Hz. Hyperventilation and photic stimulation were not performed.   ABNORMALITY - Sharp wave, left hemisphere, maximal left posterior quadrant - Continuous slow, generalized and lateralized left hemisphere IMPRESSION: This study showed evidence of epileptogenicity and cortical dysfunction arising from left hemisphere, maximal left posterior quadrant likely due to underlying structural abnormality.  Additionally there is moderate to severe diffuse encephalopathy, nonspecific to etiology.  No definite seizures were seen during the study. PLora Havens  EEG adult  Result Date: 05/01/2022 KGreta Doom MD     05/01/2022  2:44 AM History: 46year old female being evaluated for nonconvulsive status epilepticus Sedation: Ativan given earlier in the evening Technique: This EEG was  acquired with electrodes placed according to the International 10-20 electrode system (including Fp1, Fp2, F3, F4, C3, C4, P3, P4, O1, O2, T3, T4, T5, T6, A1, A2, Fz, Cz, Pz). The following electrodes were missing or displaced: none. Background: There is a poorly sustained posterior dominant rhythm that is better seen on the right than left and she has a frequency of 10 Hz.  There are lateralized periodic discharges with phase reversal at F7 and theta wave morphology which wax and wane without definite evolution throughout the recording.  There is also significant left hemispheric focal slow activity. Photic stimulation: Physiologic driving is now performed EEG Abnormalities: 1) lateralized periodic  discharges with theta wave morphology 2) left hemispheric slow activity Clinical Interpretation: This EEG recorded the pattern on the ictal-interictal continuum suggestive of a highly irritable area in the left hemisphere without definite seizure.  Recommend continuous EEG monitoring. Roland Rack, MD [facility] [phone] If 7pm- 7am, please page neurology on call as listed in AMION.'  DG Chest Port 1 View  Result Date: 05/01/2022 CLINICAL DATA:  Seizure EXAM: PORTABLE CHEST 1 VIEW COMPARISON:  04/29/2022 FINDINGS: Unchanged cardiac and mediastinal contours. Chronic scattered calcified pulmonary nodules, likely sequela of prior granulomatous disease. No new focal pulmonary opacity. No pleural effusion or pneumothorax. Scoliosis. No acute osseous abnormality. IMPRESSION: No acute cardiopulmonary process. Electronically Signed   By: Merilyn Baba M.D.   On: 05/01/2022 00:18   CT Head Wo Contrast  Result Date: 04/30/2022 CLINICAL DATA:  Head trauma, abnormal mental status (Age 54-64y) EXAM: CT HEAD WITHOUT CONTRAST TECHNIQUE: Contiguous axial images were obtained from the base of the skull through the vertex without intravenous contrast. RADIATION DOSE REDUCTION: This exam was performed according to the departmental dose-optimization program which includes automated exposure control, adjustment of the mA and/or kV according to patient size and/or use of iterative reconstruction technique. COMPARISON:  CT head 09/11/2018 FINDINGS: Brain: No evidence of large-territorial acute infarction. No parenchymal hemorrhage. No mass lesion. No extra-axial collection. No mass effect or midline shift. No hydrocephalus. Basilar cisterns are patent. Vascular: No hyperdense vessel. Skull: No acute fracture or focal lesion. Sinuses/Orbits: Right maxillary sinus mucosal thickening. Otherwise paranasal sinuses and mastoid air cells are clear. The orbits are unremarkable. Other: None. IMPRESSION: No acute  intracranial abnormality. Electronically Signed   By: Iven Finn M.D.   On: 04/30/2022 21:42        Scheduled Meds:  dextrose  12.5 g Intravenous STAT   diltiazem  240 mg Oral Daily   enoxaparin (LOVENOX) injection  20 mg Subcutaneous Q24H   levothyroxine  75 mcg Oral QAC breakfast   oseltamivir  30 mg Oral Q12H   pantoprazole  40 mg Oral Daily   PHENObarbital  65 mg Intravenous Q1200   sodium chloride flush  3 mL Intravenous Q12H   thiamine (VITAMIN B1) injection  100 mg Intravenous Daily   Continuous Infusions:  lacosamide (VIMPAT) IV 100 mg (05/02/22 0026)   levETIRAcetam 1,000 mg (05/01/22 2223)   potassium chloride 10 mEq (05/02/22 0928)          Aline August, MD [facility] 05/02/2022, 10:26 AM

## 2022-05-02 NOTE — Progress Notes (Signed)
BSE completed, full report to follow.   Pt currently has moderate oral and severe pharyngeal dysphagia- resulting in overt coughing with any intake. Cough is not productive - family reports she coughs when she eats since her cancer and is able to expectorate.  She is deconditioned currently thus unable to clear.  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 At this time, SLP would not recommend po diet when she awakes-and she would benefit from [facility] when she can consisently elicit a swallow. She swallowed twice during entire 25 minute session.  Suspect she has been aspirating chronically and has tolerated largely but dysphagia contributing to weight loss and she will not tolerate severe aspiration at this time.  SLP questions if  family/pt would want PEG replaced for nutrition again.  Daughter says she wants her to get "what she needs".    Crystal Lime, [facility] SLP Acute Rehab Services Office [phone] Pager [phone]

## 2022-05-02 NOTE — Progress Notes (Signed)
LT EEG not online. IT contacted to resolve issue.

## 2022-05-02 NOTE — Evaluation (Signed)
Clinical/Bedside Swallow Evaluation Patient Details  Name: Crystal Spence MRN: [id number] Date of Birth: 1975/10/08  Today's Date: 05/02/2022 Time: SLP Start Time (ACUTE ONLY): 9379 SLP Stop Time (ACUTE ONLY): 1030 SLP Time Calculation (min) (ACUTE ONLY): 35 min  Past Medical History:  Past Medical History:  Diagnosis Date   Anemia, unspecified 06/17/2013   Headache    History of laryngeal cancer 02/03/2013   Hypertension    Hypothyroidism (acquired) 06/17/2013   Multiple lung nodules 06/28/2014   Pneumonia 01/14/2014, 04/2017   Poor oral hygiene 06/17/2013   Rib pain 01/14/2014   Rib pain on left side 01/05/2014   Throat cancer [facility])    Past Surgical History:  Past Surgical History:  Procedure Laterality Date   ABDOMINAL SURGERY     BIOPSY  01/18/2018   Procedure: BIOPSY;  Surgeon: Wonda Horner, MD;  Location: [place];  Service: Endoscopy;;   COLONOSCOPY WITH PROPOFOL N/A 10/12/2015   Procedure: COLONOSCOPY WITH PROPOFOL;  Surgeon: Wonda Horner, MD;  Location: [facility] ENDOSCOPY;  Service: Endoscopy;  Laterality: N/A;   ESOPHAGOGASTRODUODENOSCOPY N/A 02/03/2013   Procedure: ESOPHAGOGASTRODUODENOSCOPY (EGD);  Surgeon: Juanita Craver, MD;  Location: [facility] ENDOSCOPY;  Service: Endoscopy;  Laterality: N/A;   ESOPHAGOGASTRODUODENOSCOPY N/A 10/12/2015   Procedure: ESOPHAGOGASTRODUODENOSCOPY (EGD);  Surgeon: Wonda Horner, MD;  Location: [facility];  Service: Endoscopy;  Laterality: N/A;   ESOPHAGOGASTRODUODENOSCOPY (EGD) WITH PROPOFOL N/A 01/18/2018   Procedure: ESOPHAGOGASTRODUODENOSCOPY (EGD) WITH PROPOFOL;  Surgeon: Wonda Horner, MD;  Location: [facility] ENDOSCOPY;  Service: Endoscopy;  Laterality: N/A;   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  CT of the head was negative for acute abnormality.  She was treated with IV Ativan and Keppra.  Neurology was consulted and patient was transferred to [facility] and started on EEG.".  Swallow eval ordered.  Pt had a trach and PEG in 2015 with cancer diagnosis, tx.  She also has a paralyzed vocal cord -  [name] note from [place] 2019-  "Nasopharynx clear. Oropharynx, hypopharynx and larynx with diffuse radiation changes. Left cord does not move. Right side move well. Both cords look healthy. No masses identified. Diffuse radiation changes distort the entire laryngeal anatomy." [name] scoping pt.    Assessment / Plan / Recommendation  Clinical Impression  Pt currently presents with clinical indications of moderate oral and severe pharyngeal dysphagia. She is sleepy but would awaken to SLP and family verbal/visual stimuation. Ms Herandez is not attending to her left side or turning her head at this time.  She allowed oral care with total assist (SLP set up suction)  - poor dentition present.  After oral care,pt accepted intake including minimal water via tsp, cup and nectar thick juice.  Excessively delayed swallow noted when pt did swallow- followed by immediate congestsed coughing across all boluses. Weak cough was not productive and she appeared uncomfortable with suspected overt aspiration.    Family reports coughs when she eats  since her cancer (at level observed during BSE) and she is able to expectorate. She is deconditioned currently thus unable to clear and her aspiration pna risk if very high. Ms Leone has a h/o laryngeal cancer s/p XRT, has paralyzed vocal cord and had trach, peg during tx in 2015.     Pt  appears frail and cachectic raising concern for adequate nutrition PTA.  Highly recommend NPO x oral care and consideration for MBS when she can consistently elicit a swallow. Suspect she has been aspirating chronically and has tolerated when at baseline health.  At this time, swallow is impaired enough to both prohibit adequate nurtrition and maintain airway protection.  Prognosis for swallow to return to level where pt can tolerate aspiration is guarded at best in this SLPs opinion.    SLP questions if family/pt would want PEG replaced for nutrition again. Daughter says she wants her to get "what she needs". Education took place with pt and her 2 daughter regarding prognostic concerns with current level of dysphagia.  CT August 14, 2021 showed  "Mild layering debris within the  trachea and right mainstem bronchus." SLP Visit Diagnosis: Dysphagia, oropharyngeal phase (R13.12)    Aspiration Risk  Severe aspiration risk;Risk for inadequate nutrition/hydration    Diet Recommendation NPO (oral moisture only)   Medication Administration: Via alternative means    Other  Recommendations Oral Care Recommendations: Oral care QID    Recommendations for follow up therapy are one component of a multi-disciplinary discharge planning process, led by the attending physician.  Recommendations may be updated based on patient status, additional functional criteria and insurance authorization.  Follow up Recommendations No SLP follow up      Assistance Recommended at Discharge  Full   Functional Status Assessment Patient has had a recent decline in their functional status and demonstrates the ability to make significant improvements in function in a reasonable and predictable amount of time.  Frequency and Duration min 2x/week  2 weeks       Prognosis Prognosis for Safe Diet Advancement: Guarded Barriers to Reach Goals: Severity of deficits;Time post onset      Swallow Study   General Date of Onset:  05/02/22 HPI: Per MD note, "46 y.o. 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  CT of the head was negative for acute abnormality.  She was treated with IV Ativan and Keppra.  Neurology was consulted and patient was transferred to [facility] and started on EEG.".  Swallow eval ordered.  Pt had a trach and PEG in 2015 with cancer diagnosis, tx.  She also has a paralyzed vocal cord -  [name] note from [place] 2019-  "Nasopharynx clear. Oropharynx, hypopharynx and larynx with diffuse radiation changes. Left cord does not move. Right side move well. Both cords look healthy. No masses identified. Diffuse radiation changes distort the entire laryngeal anatomy." [name] scoping pt. Type of Study: Bedside Swallow Evaluation Diet Prior to this Study: NPO Temperature Spikes Noted: No Respiratory Status: Nasal cannula History of Recent Intubation: No Behavior/Cognition: Lethargic/Drowsy Oral Cavity Assessment: Dry Oral Care Completed by SLP: Yes Oral Cavity - Dentition: Poor condition Vision: Impaired for self-feeding Self-Feeding Abilities: Total assist Patient Positioning: Upright in bed Baseline Vocal Quality: Hoarse;Low vocal intensity Volitional  Cough: Cognitively unable to elicit Volitional Swallow: Unable to elicit    Oral/Motor/Sensory Function Overall Oral Motor/Sensory Function: Generalized oral weakness (pt did not follow directions and is grossly weak)   Ice Chips Ice chips: Not tested   Thin Liquid Thin Liquid: Impaired Presentation: Cup;Spoon Oral Phase Impairments: Reduced labial seal Pharyngeal  Phase Impairments: Suspected delayed Swallow;Cough - Immediate    Nectar Thick Nectar Thick Liquid: Impaired Presentation:  Spoon;Cup Oral Phase Impairments: Reduced labial seal Oral phase functional implications: Prolonged oral transit Pharyngeal Phase Impairments: Cough - Immediate;Suspected delayed Swallow   Honey Thick Honey Thick Liquid: Not tested   Puree Puree: Not tested   Solid     Solid: Not tested      Macario Golds 05/02/2022,1:28 PM   Kathleen Lime, MS [facility] SLP Acute Rehab Services Office [phone] Pager [phone]

## 2022-05-03 DIAGNOSIS — G40901 Epilepsy, unspecified, not intractable, with status epilepticus: Secondary | ICD-10-CM | POA: Diagnosis not present

## 2022-05-03 DIAGNOSIS — J101 Influenza due to other identified influenza virus with other respiratory manifestations: Secondary | ICD-10-CM | POA: Diagnosis not present

## 2022-05-03 DIAGNOSIS — Z8521 Personal history of malignant neoplasm of larynx: Secondary | ICD-10-CM | POA: Diagnosis not present

## 2022-05-03 DIAGNOSIS — J9601 Acute respiratory failure with hypoxia: Secondary | ICD-10-CM | POA: Diagnosis not present

## 2022-05-03 LAB — COMPREHENSIVE METABOLIC PANEL
ALT: 13 U/L (ref 0–44)
AST: 26 U/L (ref 15–41)
Albumin: 2.8 g/dL — ABNORMAL LOW (ref 3.5–5.0)
Alkaline Phosphatase: 93 U/L (ref 38–126)
Anion gap: 10 (ref 5–15)
BUN: 8 mg/dL (ref 6–20)
CO2: 22 mmol/L (ref 22–32)
Calcium: 8.5 mg/dL — ABNORMAL LOW (ref 8.9–10.3)
Chloride: 110 mmol/L (ref 98–111)
Creatinine, Ser: 0.91 mg/dL (ref 0.44–1.00)
GFR, Estimated: >60 mL/min (ref >60)
Glucose, Bld: 124 mg/dL — ABNORMAL HIGH (ref 70–99)
Potassium: 3.9 mmol/L (ref 3.5–5.1)
Sodium: 142 mmol/L (ref 135–145)
Total Bilirubin: 0.9 mg/dL (ref 0.3–1.2)
Total Protein: 6.1 g/dL — ABNORMAL LOW (ref 6.5–8.1)

## 2022-05-03 LAB — CBC
HCT: 44.6 % (ref 36.0–46.0)
Hemoglobin: 15 g/dL (ref 12.0–15.0)
MCH: 32.3 pg (ref 26.0–34.0)
MCHC: 33.6 g/dL (ref 30.0–36.0)
MCV: 95.9 fL (ref 80.0–100.0)
Platelets: 314 10*3/uL (ref 150–400)
RBC: 4.65 MIL/uL (ref 3.87–5.11)
RDW: 16.6 % — ABNORMAL HIGH (ref 11.5–15.5)
WBC: 16.5 10*3/uL — ABNORMAL HIGH (ref 4.0–10.5)
nRBC: 0 % (ref 0.0–0.2)

## 2022-05-03 LAB — GLUCOSE, CAPILLARY
Glucose-Capillary: 131 mg/dL — ABNORMAL HIGH (ref 70–99)
Glucose-Capillary: 121 mg/dL — ABNORMAL HIGH (ref 70–99)
Glucose-Capillary: 89 mg/dL (ref 70–99)
Glucose-Capillary: 87 mg/dL (ref 70–99)
Glucose-Capillary: 91 mg/dL (ref 70–99)
Glucose-Capillary: 103 mg/dL — ABNORMAL HIGH (ref 70–99)

## 2022-05-03 MED ORDER — METOPROLOL TARTRATE 5 MG/5ML IV SOLN: 2.5000 mg | INTRAVENOUS | Status: DC | PRN

## 2022-05-03 MED ORDER — PHENOBARBITAL SODIUM 130 MG/ML IJ SOLN
130.0000 mg | Freq: Two times a day (BID) | INTRAMUSCULAR | Status: DC
  Administered 2022-05-03 – 2022-05-07 (×9): 130 mg via INTRAVENOUS
  Filled 2022-05-03 (×9): qty 1

## 2022-05-03 NOTE — Progress Notes (Addendum)
Subjective: NAEO. Per daughter she has been communicating more but also talking to people not in room  ROS: Unable to obtain due to poor mental status  Examination  Vital signs in last 24 hours: Temp:  [98 F (36.7 C)-98.7 F (37.1 C)] 98 F (36.7 C) (12/22 0928) Pulse Rate:  [91-103] 103 (12/22 0928) Resp:  [17-20] 20 (12/22 0928) BP: (121-158)/(84-108) 158/108 (12/22 0928) SpO2:  [95 %-100 %] 100 % (12/22 0928) Weight:  [40.4 kg] 40.4 kg (12/22 0500)  General: lying in bed, NAD Neuro: Awake, alert, looks at examiner but does have right gaze preference and neglecting left side, PERRLA, EOMI, unable to name objects, unable to follow commands, moves all 4 extremities with antigravity strength but appears to have right hemiparesis.  Basic Metabolic Panel: Recent Labs  Lab 04/30/22 0818 04/30/22 1617 05/01/22 0045 05/01/22 1337 05/02/22 0728 05/03/22 0359  NA 134* 135 135 137 136 142  K 2.6* 3.7 3.1* 3.1* 2.9* 3.9  CL 95* 104 95* 102 102 110  CO2 28  --  30 21* 20* 22  GLUCOSE 151* 86 128* 77 85 124*  BUN 7 4* 7 5* 7 8  CREATININE 0.73 0.50 0.79 0.76 1.00 0.91  CALCIUM 8.8*  --  8.1* 7.4* 7.5* 8.5*  MG  --   --  2.5* 1.8 2.0  --   PHOS  --   --   --  1.9*  --   --     CBC: Recent Labs  Lab 04/30/22 0818 04/30/22 1617 05/01/22 0045 05/01/22 0631 05/02/22 0508 05/03/22 0359  WBC 8.2  --  22.9* 17.2* 18.4* 16.5*  NEUTROABS 7.7  --  18.4*  --   --   --   HGB 13.5 12.9 14.1 15.4* 17.9* 15.0  HCT 39.7 38.0 43.0 45.4 54.0* 44.6  MCV 94.5  --  98.9 97.0 97.3 95.9  PLT 367  --  355 306 233 314   Coagulation Studies: No results for input(s): "LABPROT", "INR" in the last 72 hours.  Imaging NO new imaging   ASSESSMENT AND PLAN: 46 year old female with recurrent seizures in the setting of flu and potential alcohol use/withdrawal (alcohol level less than 10 on arrival).   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 May need ativan if agitated/ unable to cooperate -Continue seizure precautions -As needed IV Ativan 2 mg for clinical seizure-like activity -Management of rest of comorbidities per primary team -Discussed plan with family and RN at bedside as well as Dr. Starla Link via secure chat   I have spent a total of  35 minutes with the patient reviewing hospital notes,  test results, labs and examining the patient as well as establishing an assessment and plan that was discussed personally with the patient.  > 50% of time was spent in direct patient care.   Zeb Comfort Epilepsy [facility] For questions after 5pm please refer to AMION to reach the Neurologist on call

## 2022-05-03 NOTE — NC FL2 (Signed)
[place] LEVEL OF CARE FORM     IDENTIFICATION  Patient Name: Crystal Spence Birthdate: 1975-12-25 Sex: female Admission Date (Current Location): 04/30/2022  [facility] and [place] Number:  Herbalist and Address:  [facility] [place] [place] [address] [place], [place] [address]      Provider Number: [id number]  Attending Physician Name and Address:  Aline August, MD  Relative Name and Phone Number:  Boyd Kerbs Daughter   [phone]    Current Level of Care: SNF Recommended Level of Care: [place] Prior Approval Number:    Date Approved/Denied:   PASRR Number:    Discharge Plan: SNF    Current Diagnoses: Patient Active Problem List   Diagnosis Date Noted   Status epilepticus ([place]) 05/01/2022   Acute respiratory failure with hypoxia ([place]) 05/01/2022   Influenza A 05/01/2022   Pyelonephritis 08/14/2021   Nausea and vomiting 08/14/2021   Hypokalemia 03/15/2019   Seizure-like activity ([place]) 05/03/2018   Hypertensive urgency 05/03/2018   Chronic headaches 05/03/2018   Dysphagia 02/04/2018   Pressure injury of skin 01/18/2018   GI bleed 01/17/2018   Melena    Smoking 11/03/2017   Seizure ([place])    Essential hypertension 06/02/2017   Positive pregnancy test 06/02/2017   Bronchitis, acute    Observed seizure-like activity ([place]) 06/01/2017   Medication monitoring encounter 04/29/2016   Protein-calorie malnutrition, severe 03/19/2016   Chronic anemia    Thrombocytosis    Abscess of lower lobe of right lung with pneumonia (HCC)    Hypoxemia    Poor dentition    Pulmonary abscess ([place]) 03/17/2016   Dehydration 03/17/2016   Abnormal LFTs 12/14/2015   Iron deficiency anemia 12/04/2015   Multiple lung nodules 06/28/2014   Other type of nonintractable migraine 06/28/2014   Leukopenia 06/28/2014   Rib pain 01/14/2014   Rib pain on left side 01/05/2014   Unexplained weight loss 01/05/2014   Anemia in  chronic illness 06/17/2013   Hypothyroidism (acquired) 06/17/2013   Poor oral hygiene 06/17/2013   Acute blood loss anemia 02/03/2013   Syncope 02/03/2013   Hyponatremia 02/03/2013   History of laryngeal cancer 02/03/2013   Hypokalemia due to excessive gastrointestinal loss of potassium 02/02/2013   Alcohol abuse 02/02/2013   Upper GI bleed 02/02/2013    Orientation RESPIRATION BLADDER Height & Weight     Self, Place (Per nurses assessment)  Normal External catheter, Continent Weight: 89 lb 1.1 oz (40.4 kg) Height:  '5\' 1"'$  (154.9 cm)  BEHAVIORAL SYMPTOMS/MOOD NEUROLOGICAL BOWEL NUTRITION STATUS      Continent Diet (see d/c summary)  AMBULATORY STATUS COMMUNICATION OF NEEDS Skin   Extensive Assist Verbally Normal                       Personal Care Assistance Level of Assistance  Bathing, Feeding, Dressing Bathing Assistance: Maximum assistance Feeding assistance: Maximum assistance Dressing Assistance: Maximum assistance     Functional Limitations Info  Sight, Speech, Hearing Sight Info: Adequate Hearing Info: Adequate Speech Info: Adequate    SPECIAL CARE FACTORS FREQUENCY  PT (By licensed PT), OT (By licensed OT)     PT Frequency: 5x/wk OT Frequency: 5x/wk            Contractures Contractures Info: Not present    Additional Factors Info  Code Status Code Status Info: Full             Current Medications (05/03/2022):  This is  the current hospital active medication list Current Facility-Administered Medications  Medication Dose Route Frequency Provider Last Rate Last Admin   acetaminophen (TYLENOL) tablet 650 mg  650 mg Oral Q6H PRN Opyd, Ilene Qua, MD       Or   acetaminophen (TYLENOL) suppository 650 mg  650 mg Rectal Q6H PRN Opyd, Ilene Qua, MD       dextrose 5 % and 0.9 % NaCl with KCl 40 mEq/L infusion   Intravenous Continuous Starla Link, Kshitiz, MD 75 mL/hr at 05/03/22 1223 Restarted at 05/03/22 1223   enoxaparin (LOVENOX) 100 mg/mL injection 20  mg  20 mg Subcutaneous Q24H Opyd, Ilene Qua, MD   20 mg at 05/03/22 1105   lacosamide (VIMPAT) 100 mg in sodium chloride 0.9 % 25 mL IVPB  100 mg Intravenous Q12H Greta Doom, MD 70 mL/hr at 05/03/22 1010 100 mg at 05/03/22 1010   levETIRAcetam (KEPPRA) IVPB 1000 mg/100 mL premix  1,000 mg Intravenous Q12H Greta Doom, MD 400 mL/hr at 05/03/22 1107 1,000 mg at 05/03/22 1107   levothyroxine (SYNTHROID) tablet 75 mcg  75 mcg Oral QAC breakfast Opyd, Ilene Qua, MD       LORazepam (ATIVAN) injection 2 mg  2 mg Intravenous PRN Lora Havens, MD       metoprolol tartrate (LOPRESSOR) injection 2.5 mg  2.5 mg Intravenous Q4H PRN Aline August, MD       ondansetron (ZOFRAN) tablet 4 mg  4 mg Oral Q6H PRN Opyd, Ilene Qua, MD       Or   ondansetron (ZOFRAN) injection 4 mg  4 mg Intravenous Q6H PRN Opyd, Ilene Qua, MD       oseltamivir (TAMIFLU) capsule 30 mg  30 mg Oral Q12H Opyd, Ilene Qua, MD       PHENObarbital (LUMINAL) injection 130 mg  130 mg Intravenous BID Lora Havens, MD   130 mg at 05/03/22 1111   senna-docusate (Senokot-S) tablet 1 tablet  1 tablet Oral QHS PRN Opyd, Ilene Qua, MD       sodium chloride flush (NS) 0.9 % injection 3 mL  3 mL Intravenous Q12H Opyd, Ilene Qua, MD   3 mL at 05/03/22 1058   thiamine (VITAMIN B1) injection 100 mg  100 mg Intravenous Daily Lora Havens, MD   100 mg at 05/03/22 1112     Discharge Medications: Please see discharge summary for a list of discharge medications.  Relevant Imaging Results:  Relevant Lab Results:   Additional Information SS#: [id number]  Jinger Neighbors, LCSW

## 2022-05-03 NOTE — Procedures (Incomplete)
Patient Name: CANDY LEVERETT  MRN: [id number]  Epilepsy Attending: Lora Havens  Referring Physician/Provider: Greta Doom, MD  Duration: 05/03/2022 6301 to 05/04/2022 0233   Patient history: 46 year old female being evaluated for nonconvulsive status epilepticus.   Level of alertness: Awake, asleep   AEDs during EEG study: LEV, LCM, Phenobarb   Technical aspects: This EEG study was done with scalp electrodes positioned according to the 10-20 International system of electrode placement. Electrical activity was reviewed with band pass filter of 1-'70Hz'$ , sensitivity of 7 uV/mm, display speed of 42m/sec with a '60Hz'$  notched filter applied as appropriate. EEG data were recorded continuously and digitally stored.  Video monitoring was available and reviewed as appropriate.   Description: During awake state, no clear posterior dominant rhythm was seen. Sleep was characterized by vertex waves, sleep spindles (12 to 14 Hz), maximal frontocentral region. EEG showed continuous generalized and lateralized left hemisphere 3 to 5 Hz theta-delta slowing as well as 12 to 15 Hz beta activity seen predominantly in right hemisphere. Sharp waves were noted in left hemisphere, maximal left fronto-central region which appeared quasiperiodic at 0.5 to 1 Hz. Hyperventilation and photic stimulation were not performed.      ABNORMALITY - Sharp wave, left fronto-central region - Continuous slow, generalized and lateralized left hemisphere   IMPRESSION: This study showed evidence of epileptogenicity and cortical dysfunction arising from left hemisphere, maximal left fronto-central region likely due to underlying structural abnormality. Additionally there is moderate to severe diffuse encephalopathy, nonspecific to etiology.  No definite seizures were seen during the study.   Kazuki Ingle OBarbra Sarks

## 2022-05-03 NOTE — TOC Initial Note (Addendum)
Transition of Care [facility]) - Initial/Assessment Note    Patient Details  Name: Crystal Spence MRN: [id number] Date of Birth: [birth year]  Transition of Care [facility]) CM/SW Contact:    Jinger Neighbors, LCSW Phone Number: 05/03/2022, 1:32 PM  Clinical Narrative:                  CSW completed chart review and went to visit patient at bedside; however, pt being seen by other discipline at the time. CSW called Janett Billow, pt's daughter and discussed PT recommendations. Jessica at bedside. CSW asked to discuss recommendations with pt via speaker phone, but Janett Billow reports her mother is currently hallucinating and not processing information. Janett Billow is agreeable to SNF.  Work up and fax out complete.   Expected Discharge Plan: Skilled Nursing Facility Barriers to Discharge: Continued Medical Work up   Patient Goals and CMS Choice   CMS Medicare.gov Compare Post Acute Care list provided to:: Patient Represenative (must comment) (daughter- Janett Billow) Choice offered to / list presented to : Adult Children      Expected Discharge Plan and Services       Living arrangements for the past 2 months: Hotel/Motel (Janett Billow reports pt has lived at a hotel for the last 3 years)                                      Prior Living Arrangements/Services Living arrangements for the past 2 months: Hotel/Motel (Janett Billow reports pt has lived at a hotel for the last 3 years) Lives with:: Self Patient language and need for interpreter reviewed:: Yes Do you feel safe going back to the place where you live?: Yes      Need for Family Participation in Patient Care: Yes (Comment) Care giver support system in place?: Yes (comment)   Criminal Activity/Legal Involvement Pertinent to Current Situation/Hospitalization: Yes - Comment as needed  Activities of Daily Living Home Assistive Devices/Equipment: None ADL Screening (condition at time of admission) Patient's cognitive ability adequate to safely complete  daily activities?: No Is the patient deaf or have difficulty hearing?: No Does the patient have difficulty seeing, even when wearing glasses/contacts?: No Does the patient have difficulty concentrating, remembering, or making decisions?: No Patient able to express need for assistance with ADLs?: No Does the patient have difficulty dressing or bathing?: Yes Independently performs ADLs?: No (Pt postictal on admission) Communication: Dependent Is this a change from baseline?: Change from baseline, expected to last <3 days Dressing (OT): Dependent Is this a change from baseline?: Change from baseline, expected to last <3days Grooming: Dependent Is this a change from baseline?: Change from baseline, expected to last <3 days Feeding: Dependent Is this a change from baseline?: Change from baseline, expected to last <3 days Bathing: Dependent Is this a change from baseline?: Change from baseline, expected to last <3 days Toileting: Dependent Is this a change from baseline?: Change from baseline, expected to last <3 days In/Out Bed: Dependent Is this a change from baseline?: Change from baseline, expected to last <3 days Walks in Home: Independent Does the patient have difficulty walking or climbing stairs?: No Weakness of Legs: None Weakness of Arms/Hands: None  Permission Sought/Granted                  Emotional Assessment              Admission diagnosis:  Hypokalemia [E87.6] Hypomagnesemia [E83.42] Status epilepticus ([place]) [  G40.901] Influenza A [J10.1] Patient Active Problem List   Diagnosis Date Noted   Status epilepticus ([place]) 05/01/2022   Acute respiratory failure with hypoxia (HCC) 05/01/2022   Influenza A 05/01/2022   Pyelonephritis 08/14/2021   Nausea and vomiting 08/14/2021   Hypokalemia 03/15/2019   Seizure-like activity ([place]) 05/03/2018   Hypertensive urgency 05/03/2018   Chronic headaches 05/03/2018   Dysphagia 02/04/2018   Pressure injury of skin  01/18/2018   GI bleed 01/17/2018   Melena    Smoking 11/03/2017   Seizure ([place])    Essential hypertension 06/02/2017   Positive pregnancy test 06/02/2017   Bronchitis, acute    Observed seizure-like activity ([place]) 06/01/2017   Medication monitoring encounter 04/29/2016   Protein-calorie malnutrition, severe 03/19/2016   Chronic anemia    Thrombocytosis    Abscess of lower lobe of right lung with pneumonia ([place])    Hypoxemia    Poor dentition    Pulmonary abscess ([place]) 03/17/2016   Dehydration 03/17/2016   Abnormal LFTs 12/14/2015   Iron deficiency anemia 12/04/2015   Multiple lung nodules 06/28/2014   Other type of nonintractable migraine 06/28/2014   Leukopenia 06/28/2014   Rib pain 01/14/2014   Rib pain on left side 01/05/2014   Unexplained weight loss 01/05/2014   Anemia in chronic illness 06/17/2013   Hypothyroidism (acquired) 06/17/2013   Poor oral hygiene 06/17/2013   Acute blood loss anemia 02/03/2013   Syncope 02/03/2013   Hyponatremia 02/03/2013   History of laryngeal cancer 02/03/2013   Hypokalemia due to excessive gastrointestinal loss of potassium 02/02/2013   Alcohol abuse 02/02/2013   Upper GI bleed 02/02/2013   PCP:  Trey Sailors, PA Pharmacy:   [facility] [place], [place] [address] [place] [address] Phone: [phone] Fax: [phone]  [facility] [phone] - [place], [place] - [address] AT [facility] [address] [facility] [address] Phone: [phone] Fax: [phone]     Social Determinants of Health ([place]) Social History: SDOH Screenings   Food Insecurity: No Food Insecurity (05/01/2022)  Housing: Low Risk  (05/01/2022)  Transportation Needs: No Transportation Needs (05/01/2022)  Utilities: Not At Risk (05/01/2022)  Financial Resource Strain: Medium Risk (01/17/2018)  Tobacco Use: Medium Risk  (05/01/2022)   SDOH Interventions:     Readmission Risk Interventions     No data to display

## 2022-05-03 NOTE — Progress Notes (Signed)
Speech Language Pathology Treatment: Dysphagia  Patient Details Name: Crystal Spence MRN: [id number] DOB: 11-Sep-1975 Today's Date: 05/03/2022 Time: 8786-7672 SLP Time Calculation (min) (ACUTE ONLY): 12 min  Assessment / Plan / Recommendation Clinical Impression  Pt's mentation remains very altered and is not conducive to starting PO diet or pursuing instrumental testing of swallowing. Max cues are needed to get her to attend to boluses. She does not always appear to visually focus her attention to spoon, presented on either side, and she does not orally take these boluses even when SLP touches them to her lips. With a single trial, presented to pt's R side, she did appear to have mildly improved awareness and not only did she sip on the water off a spoon, but she swallowed it as well. Baseline coughing makes clinical assessment more difficult but again, she is cognitively not in a position to advance to more POs anyway. Recommend that she remain NPO for now pending improvements in mentation.    HPI HPI: Per MD note, "46 y.o. 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  CT of the head was negative for acute abnormality.  She was treated with IV Ativan and Keppra.  Neurology was consulted and patient was transferred to [facility] and started on EEG.".  Swallow eval ordered.  Pt had a trach and PEG in 2015 with cancer diagnosis, tx.  She also has a paralyzed vocal cord -  [name] note from [place] 2019-  "Nasopharynx clear. Oropharynx, hypopharynx and larynx with diffuse radiation changes. Left cord does not move. Right side move well. Both cords look healthy. No masses identified. 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      SLP Plan  Continue with current plan of care      Recommendations for follow up therapy are one component of a multi-disciplinary discharge planning process, led by the attending physician.  Recommendations may be updated based on patient status, additional functional criteria and insurance authorization.    Recommendations  Diet recommendations: NPO Medication Administration: Via alternative means                Oral Care Recommendations: Oral care QID Follow Up Recommendations: Skilled nursing-short term rehab (<3 hours/day) Assistance recommended at discharge: Frequent or constant Supervision/Assistance SLP Visit Diagnosis: Dysphagia, oropharyngeal phase (R13.12) Plan: Continue with current plan of care           Osie Bond., M.A. [place] Office [phone]  Secure chat preferred   05/03/2022, 12:14 PM

## 2022-05-03 NOTE — Progress Notes (Signed)
PROGRESS NOTE    JAMYRIA Spence  BJY:[id number] DOB: 1975-10-14 DOA: 04/30/2022 PCP: Trey Sailors, PA   Brief Narrative:  46 y.o. 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  CT of the head was negative for acute abnormality.  She was treated with IV Ativan and Keppra.  Neurology was consulted and patient was transferred to [facility] and started on EEG.  Assessment & Plan:   Status epilepticus -Neurology following: Currently on Vimpat, phenobarbital and Keppra  - LTM EEG ongoing. -Monitor mental status.  Fall precautions.  Seizures precautions. -Currently n.p.o. as per recommendations  Influenza A  bronchitis -Diagnosed on 04/29/2022 -Continue droplet precautions  -Has not not been able to get Tamiflu because of being n.p.o.  Acute respiratory failure with hypoxia -Possibly from above.  Initially required supplemental oxygen up to 5 L/min; currently needing up to 2 L/min intermittently.  Hypertensive urgency -Blood pressure has much improved.  Use IV metoprolol as needed.  DC oral Cardizem as she is not able to take orally for now  Leukocytosis -Possibly reactive.  Improving but still significant.  Monitor  Hypothyroidism -Continue levothyroxine as soon as she is able to take orally.  Might have to switch to IV levothyroxine if she continues to remain NPO in the next couple of days.  Hypokalemia -Improved.  History of laryngeal cancer -Cancer free for many years, discharged from oncology follow-up in 2020     DVT prophylaxis: Lovenox Code Status: Full Family Communication: Daughter and niece at bedside Disposition Plan: Status is: Inpatient Remains inpatient appropriate because: Of severity of  illness    Consultants: Neurology  Procedures: LTM EEG  Antimicrobials: None   Subjective: Patient seen and examined at bedside.  No vomiting or fever, agitation or seizures reported.  Awake, hardly answers any questions.  Objective: Vitals:   05/02/22 1607 05/02/22 2000 05/02/22 2352 05/03/22 0500  BP: 121/84 (!) 127/91 (!) 135/96   Pulse: 93 91 91   Resp: '18 18 17   '$ Temp: 98.5 F (36.9 C) 98.4 F (36.9 C) 98.7 F (37.1 C)   TempSrc: Oral Oral Oral   SpO2: 95% 95% 96%   Weight:    40.4 kg  Height:        Intake/Output Summary (Last 24 hours) at 05/03/2022 0745 Last data filed at 05/02/2022 2150 Gross per 24 hour  Intake 3 ml  Output --  Net 3 ml    Filed Weights   04/30/22 2029 05/02/22 0448 05/03/22 0500  Weight: 40.8 kg 40 kg 40.4 kg    Examination:  General: On 2 L oxygen via nasal cannula.  No distress.  LTM EEG ongoing.  Chronically ill and deconditioned looking ENT/neck: No thyromegaly.  JVD is not elevated  respiratory: Decreased breath sounds at bases bilaterally with some crackles; no wheezing  CVS: S1-S2 heard, rate controlled currently Abdominal: Soft, nontender, slightly distended; no organomegaly, bowel sounds are heard Extremities: Trace lower extremity edema; no cyanosis  CNS: Awake, very fidgety, hardly answers any questions.  No focal neurologic deficit.  Moves extremities Lymph: No obvious lymphadenopathy Skin: No obvious ecchymosis/lesions  psych: Flat affect.  Not agitated. musculoskeletal: No obvious joint swelling/deformity     Data Reviewed: I have personally reviewed following labs and imaging studies  CBC:  Recent Labs  Lab 04/30/22 0818 04/30/22 1617 05/01/22 0045 05/01/22 0631 05/02/22 0508 05/03/22 0359  WBC 8.2  --  22.9* 17.2* 18.4* 16.5*  NEUTROABS 7.7  --  18.4*  --   --   --   HGB 13.5 12.9 14.1 15.4* 17.9* 15.0  HCT 39.7 38.0 43.0 45.4 54.0* 44.6  MCV 94.5  --  98.9 97.0 97.3 95.9  PLT 367  --  355 306 233  330    Basic Metabolic Panel: Recent Labs  Lab 04/30/22 0818 04/30/22 1617 05/01/22 0045 05/01/22 1337 05/02/22 0728 05/03/22 0359  NA 134* 135 135 137 136 142  K 2.6* 3.7 3.1* 3.1* 2.9* 3.9  CL 95* 104 95* 102 102 110  CO2 28  --  30 21* 20* 22  GLUCOSE 151* 86 128* 77 85 124*  BUN 7 4* 7 5* 7 8  CREATININE 0.73 0.50 0.79 0.76 1.00 0.91  CALCIUM 8.8*  --  8.1* 7.4* 7.5* 8.5*  MG  --   --  2.5* 1.8 2.0  --   PHOS  --   --   --  1.9*  --   --     GFR: Estimated Creatinine Clearance: 48.2 mL/min (by C-G formula based on SCr of 0.91 mg/dL). Liver Function Tests: Recent Labs  Lab 05/01/22 0045 05/01/22 1337 05/02/22 0728 05/03/22 0359  AST 45* '29 23 26  '$ ALT '17 14 13 13  '$ ALKPHOS 111 95 87 93  BILITOT 1.3* 1.0 1.2 0.9  PROT 6.2* 5.5* 5.7* 6.1*  ALBUMIN 3.2* 2.7* 2.5* 2.8*    No results for input(s): "LIPASE", "AMYLASE" in the last 168 hours. No results for input(s): "AMMONIA" in the last 168 hours. Coagulation Profile: No results for input(s): "INR", "PROTIME" in the last 168 hours. Cardiac Enzymes: No results for input(s): "CKTOTAL", "CKMB", "CKMBINDEX", "TROPONINI" in the last 168 hours. BNP (last 3 results) No results for input(s): "PROBNP" in the last 8760 hours. HbA1C: No results for input(s): "HGBA1C" in the last 72 hours. CBG: Recent Labs  Lab 05/02/22 1402 05/02/22 1600 05/02/22 2008 05/02/22 2349 05/03/22 0401  GLUCAP 88 111* 115* 106* 131*    Lipid Profile: No results for input(s): "CHOL", "HDL", "LDLCALC", "TRIG", "CHOLHDL", "LDLDIRECT" in the last 72 hours. Thyroid Function Tests: No results for input(s): "TSH", "T4TOTAL", "FREET4", "T3FREE", "THYROIDAB" in the last 72 hours. Anemia Panel: No results for input(s): "VITAMINB12", "FOLATE", "FERRITIN", "TIBC", "IRON", "RETICCTPCT" in the last 72 hours. Sepsis Labs: No results for input(s): "PROCALCITON", "LATICACIDVEN" in the last 168 hours.  Recent Results (from the past 240 hour(s))  Resp  panel by RT-PCR (RSV, Flu A&B, Covid) Anterior Nasal Swab     Status: Abnormal   Collection Time: 04/29/22  8:02 PM   Specimen: Anterior Nasal Swab  Result Value Ref Range Status   SARS Coronavirus 2 by RT PCR NEGATIVE NEGATIVE Final    Comment: (NOTE) SARS-CoV-2 target nucleic acids are NOT DETECTED.  The SARS-CoV-2 RNA is generally detectable in upper respiratory specimens during the acute phase of infection. The lowest concentration of SARS-CoV-2 viral copies this assay can detect is 138 copies/mL. A negative result does not preclude SARS-Cov-2 infection and should not be used as the sole basis for treatment or other patient management decisions. A negative result may occur with  improper specimen collection/handling, submission of specimen other than nasopharyngeal swab, presence of viral mutation(s) within the areas targeted by this assay, and inadequate number of viral copies(<138 copies/mL). A negative result must  be combined with clinical observations, patient history, and epidemiological information. The expected result is Negative.  Fact Sheet for Patients:  EntrepreneurPulse.com.au  Fact Sheet for Healthcare Providers:  IncredibleEmployment.be  This test is no t yet approved or cleared by the [place] FDA and  has been authorized for detection and/or diagnosis of SARS-CoV-2 by FDA under an Emergency Use Authorization (EUA). This EUA will remain  in effect (meaning this test can be used) for the duration of the COVID-19 declaration under Section 564(b)(1) of the Act, 21 U.S.C.section 360bbb-3(b)(1), unless the authorization is terminated  or revoked sooner.       Influenza A by PCR POSITIVE (A) NEGATIVE Final   Influenza B by PCR NEGATIVE NEGATIVE Final    Comment: (NOTE) The Xpert Xpress SARS-CoV-2/FLU/RSV plus assay is intended as an aid in the diagnosis of influenza from Nasopharyngeal swab specimens and should not be used  as a sole basis for treatment. Nasal washings and aspirates are unacceptable for Xpert Xpress SARS-CoV-2/FLU/RSV testing.  Fact Sheet for Patients: EntrepreneurPulse.com.au  Fact Sheet for Healthcare Providers: IncredibleEmployment.be  This test is not yet approved or cleared by the [place] FDA and has been authorized for detection and/or diagnosis of SARS-CoV-2 by FDA under an Emergency Use Authorization (EUA). This EUA will remain in effect (meaning this test can be used) for the duration of the COVID-19 declaration under Section 564(b)(1) of the Act, 21 U.S.C. section 360bbb-3(b)(1), unless the authorization is terminated or revoked.     Resp Syncytial Virus by PCR NEGATIVE NEGATIVE Final    Comment: (NOTE) Fact Sheet for Patients: EntrepreneurPulse.com.au  Fact Sheet for Healthcare Providers: IncredibleEmployment.be  This test is not yet approved or cleared by the [place] FDA and has been authorized for detection and/or diagnosis of SARS-CoV-2 by FDA under an Emergency Use Authorization (EUA). This EUA will remain in effect (meaning this test can be used) for the duration of the COVID-19 declaration under Section 564(b)(1) of the Act, 21 U.S.C. section 360bbb-3(b)(1), unless the authorization is terminated or revoked.  Performed at [facility], [place] [address], [place], [place] [address]          Radiology Studies: Overnight EEG with video  Result Date: 05/01/2022 Lora Havens, MD     05/02/2022 10:17 AM Patient Name: Crystal Spence MRN: [id number] Epilepsy Attending: Lora Havens Referring Physician/Provider: Greta Doom, MD Duration: 05/01/2022 0233 to 05/02/2022 0233 Patient history: 46 year old female being evaluated for nonconvulsive status epilepticus. Level of alertness: Awake, asleep AEDs during EEG study: LEV, LCM Technical aspects:  This EEG study was done with scalp electrodes positioned according to the 10-20 International system of electrode placement. Electrical activity was reviewed with band pass filter of 1-'70Hz'$ , sensitivity of 7 uV/mm, display speed of 64m/sec with a '60Hz'$  notched filter applied as appropriate. EEG data were recorded continuously and digitally stored.  Video monitoring was available and reviewed as appropriate. Description: During awake state, no clear posterior dominant rhythm was seen. Sleep was characterized by vertex waves, sleep spindles (12 to 14 Hz), maximal frontocentral region.  EEG showed continuous generalized and lateralized left hemisphere 3 to 5 Hz theta-delta slowing as well as 12 to 15 Hz beta activity seen predominantly in right hemisphere.  Sharp waves were noted in left hemisphere, maximal left posterior quadrant which appeared quasiperiodic at 0.5 to 1 Hz. Hyperventilation and photic stimulation were not performed.   ABNORMALITY - Sharp wave, left hemisphere, maximal left posterior quadrant - Continuous  slow, generalized and lateralized left hemisphere IMPRESSION: This study showed evidence of epileptogenicity and cortical dysfunction arising from left hemisphere, maximal left posterior quadrant likely due to underlying structural abnormality.  Additionally there is moderate to severe diffuse encephalopathy, nonspecific to etiology.  No definite seizures were seen during the study. Priyanka Barbra Sarks        Scheduled Meds:  diltiazem  240 mg Oral Daily   enoxaparin (LOVENOX) injection  20 mg Subcutaneous Q24H   levothyroxine  75 mcg Oral QAC breakfast   oseltamivir  30 mg Oral Q12H   pantoprazole  40 mg Oral Daily   PHENObarbital  130 mg Intravenous Q1200   sodium chloride flush  3 mL Intravenous Q12H   thiamine (VITAMIN B1) injection  100 mg Intravenous Daily   Continuous Infusions:  dextrose 5 % and 0.9 % NaCl with KCl 40 mEq/L 75 mL/hr at 05/03/22 0332   lacosamide (VIMPAT) IV 100  mg (05/02/22 2229)   levETIRAcetam 1,000 mg (05/02/22 2149)          Aline August, MD [facility] 05/03/2022, 7:45 AM

## 2022-05-03 NOTE — Progress Notes (Signed)
vLTM maintenance   All impedances below 10kohms.  No skin breakdown noted at all skin sites 

## 2022-05-03 NOTE — Evaluation (Signed)
Physical Therapy Evaluation Patient Details Name: Crystal Spence MRN: [id number] DOB: 08/02/1975 Today's Date: 05/03/2022  History of Present Illness  Pt is a 46 y/o F presenting with seizures. 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 Pt with L neglect, internally and externally distracted, hallucinating, confused, and unable to follow one step commands. Per family, pt has been living in a hotel for past 3 years and was independent without AD. Pt transferred to EOB with mod A and required min/max A for sitting balance due to L lateral and posterior lean. Pt stood from EOB with mod HHA +2 and max cues for sequencing. Returned to semi-reclined with max A +2 as pt with poor motor planning and initiation and required +2 assist to scoot up in bed. Chest leads came loose with mobility and RN notified to attend to care. Pt would benefit from continued PT services in SNF due to current deficits. Acute PT to cont to follow.      Recommendations for follow up therapy are one component of a multi-disciplinary discharge planning process, led by the attending physician.  Recommendations may be updated based on patient status, additional functional criteria and insurance authorization.  Follow Up Recommendations Skilled nursing-short term rehab (<3 hours/day) Can patient physically be transported by private vehicle: No    Assistance Recommended at Discharge Frequent or constant Supervision/Assistance  Patient can return home with the following  Two people to help with walking and/or transfers;Two people to help with bathing/dressing/bathroom;Direct supervision/assist for medications management;Assistance with feeding;Assistance  with cooking/housework;Direct supervision/assist for financial management;Assist for transportation;Help with stairs or ramp for entrance    Equipment Recommendations Other (comment) (TBD)  Recommendations for Other Services       Functional Status Assessment Patient has had a recent decline in their functional status and demonstrates the ability to make significant improvements in function in a reasonable and predictable amount of time.     Precautions / Restrictions Precautions Precautions: Fall;Other (comment) Precaution Comments: EEG monitoring Restrictions Weight Bearing Restrictions: No      Mobility  Bed Mobility Overal bed mobility: Needs Assistance Bed Mobility: Rolling, Supine to Sit, Sit to Supine Rolling: Mod assist   Supine to sit: Mod assist Sit to supine: +2 for safety/equipment, Max assist   General bed mobility comments: Pt sat EOB with HOB elevated and mod A for trunk control. Required max A to scoot to EOB. Pt unable to sequence cues to transfer back to supine and required max A +2 for safety and due to equipment Patient Response: Restless  Transfers Overall transfer level: Needs assistance Equipment used: 2 person hand held assist Transfers: Sit to/from Stand Sit to Stand: Mod assist, +2 physical assistance           General transfer comment: Stood from EOB with +2 HHA and max cues for sequencing and initiation    Ambulation/Gait               General Gait Details: unable due to safety concerns and EEG monitoring  Stairs            Wheelchair Mobility    Modified Rankin (Stroke Patients Only)       Balance Overall balance assessment: Needs assistance Sitting-balance support: Bilateral upper extremity supported, Feet supported  Sitting balance-Leahy Scale: Zero Sitting balance - Comments: pt required anywhere from max-min A for static sitting balance due to L neglect, L lateral lean, and pt easily distracted and  hallucinating Postural control: Posterior lean, Left lateral lean Standing balance support: Bilateral upper extremity supported Standing balance-Leahy Scale: Zero Standing balance comment: Pt required +2 assist for static standing balance                             Pertinent Vitals/Pain Pain Assessment Pain Assessment: Faces Faces Pain Scale: Hurts little more Pain Location: abdomen and R shoulder Pain Intervention(s): Limited activity within patient's tolerance, Monitored during session, Repositioned    Home Living Family/patient expects to be discharged to:: Private residence Living Arrangements: Alone Available Help at Discharge: Family;Available PRN/intermittently Type of Home: Other(Comment) (hotel)             Additional Comments: Pt's daughter and neice present at bedside and providing subjective history as pt unable (confused and hallucinating). Per daughter and neice pt with independent without AD prior to admission    Prior Function Prior Level of Function : Independent/Modified Independent                     Hand Dominance   Dominant Hand: Right    Extremity/Trunk Assessment   Upper Extremity Assessment Upper Extremity Assessment: Defer to OT evaluation    Lower Extremity Assessment Lower Extremity Assessment: Generalized weakness    Cervical / Trunk Assessment Cervical / Trunk Assessment: Kyphotic  Communication   Communication: Other (comment) (speaks in low tone of voice)  Cognition Arousal/Alertness: Awake/alert Behavior During Therapy: Restless Overall Cognitive Status: Impaired/Different from baseline                                 General Comments: pt unable to follow simple one step commands. Noted L neglect and L lateral lean in sitting        General Comments General comments (skin integrity, edema, etc.): Pt confused and hallucinating throughout session and unable to follow commands    Exercises      Assessment/Plan    PT Assessment Patient needs continued PT services  PT Problem List Decreased strength;Decreased coordination;Cardiopulmonary status limiting activity;Pain;Decreased range of motion;Decreased cognition;Decreased activity tolerance;Decreased knowledge of use of DME;Decreased safety awareness;Decreased balance;Decreased mobility;Decreased knowledge of precautions;Decreased skin integrity       PT Treatment Interventions DME instruction;Balance training;Gait training;Neuromuscular re-education;Stair training;Cognitive remediation;Functional mobility training;Patient/family education;Therapeutic activities;Wheelchair mobility training;Therapeutic exercise    PT Goals (Current goals can be found in the Care Plan section)  Acute Rehab PT Goals Patient Stated Goal: unable to state due to confusion PT Goal Formulation: With patient/family Time For Goal Achievement: 05/17/22 Potential to Achieve Goals: Fair    Frequency Min 2X/week     Co-evaluation               AM-PAC PT "6 Clicks" Mobility  Outcome Measure Help needed turning from your back to your side while in a flat bed without using bedrails?: A Lot Help needed moving from lying on your back to sitting on the side of a flat bed without using bedrails?: A Lot Help needed moving to and from a bed to a chair (including a wheelchair)?: Total Help needed standing up from a chair using your arms (e.g., wheelchair or bedside chair)?: Total Help needed to walk in hospital  room?: Total Help needed climbing 3-5 steps with a railing? : Total 6 Click Score: 8    End of Session Equipment Utilized During Treatment: Gait belt Activity Tolerance: Patient tolerated treatment well;Patient limited by fatigue Patient left: in bed;with call bell/phone within reach;with bed alarm set;with family/visitor present;Other (comment) (RN attending to pt) Nurse Communication: Mobility status PT Visit Diagnosis: Unsteadiness on feet  (R26.81);Other abnormalities of gait and mobility (R26.89);Muscle weakness (generalized) (M62.81);History of falling (Z91.81);Pain Pain - Right/Left: Right (shoulder and abdomen) Pain - part of body: Shoulder (and abdomen)    Time: 2174-7159 PT Time Calculation (min) (ACUTE ONLY): 29 min   Charges:   PT Evaluation $PT Eval High Complexity: 1 High PT Treatments $Therapeutic Activity: 8-22 mins        Becky Sax PT, DPT  Blenda Nicely 05/03/2022, 8:47 AM

## 2022-05-04 DIAGNOSIS — R1312 Dysphagia, oropharyngeal phase: Secondary | ICD-10-CM

## 2022-05-04 DIAGNOSIS — I10 Essential (primary) hypertension: Secondary | ICD-10-CM | POA: Diagnosis not present

## 2022-05-04 DIAGNOSIS — G40901 Epilepsy, unspecified, not intractable, with status epilepticus: Secondary | ICD-10-CM | POA: Diagnosis not present

## 2022-05-04 LAB — GLUCOSE, CAPILLARY
Glucose-Capillary: 79 mg/dL (ref 70–99)
Glucose-Capillary: 81 mg/dL (ref 70–99)
Glucose-Capillary: 82 mg/dL (ref 70–99)
Glucose-Capillary: 85 mg/dL (ref 70–99)
Glucose-Capillary: 92 mg/dL (ref 70–99)
Glucose-Capillary: 94 mg/dL (ref 70–99)

## 2022-05-04 LAB — CBC WITH DIFFERENTIAL/PLATELET
Abs Immature Granulocytes: 0.06 10*3/uL (ref 0.00–0.07)
Basophils Absolute: 0 10*3/uL (ref 0.0–0.1)
Basophils Relative: 0 %
Eosinophils Absolute: 0 10*3/uL (ref 0.0–0.5)
Eosinophils Relative: 0 %
HCT: 44.6 % (ref 36.0–46.0)
Hemoglobin: 14.8 g/dL (ref 12.0–15.0)
Immature Granulocytes: 1 %
Lymphocytes Relative: 4 %
Lymphs Abs: 0.5 10*3/uL — ABNORMAL LOW (ref 0.7–4.0)
MCH: 32.4 pg (ref 26.0–34.0)
MCHC: 33.2 g/dL (ref 30.0–36.0)
MCV: 97.6 fL (ref 80.0–100.0)
Monocytes Absolute: 0.6 10*3/uL (ref 0.1–1.0)
Monocytes Relative: 5 %
Neutro Abs: 11.3 10*3/uL — ABNORMAL HIGH (ref 1.7–7.7)
Neutrophils Relative %: 90 %
Platelets: 305 10*3/uL (ref 150–400)
RBC: 4.57 MIL/uL (ref 3.87–5.11)
RDW: 16.6 % — ABNORMAL HIGH (ref 11.5–15.5)
WBC: 12.5 10*3/uL — ABNORMAL HIGH (ref 4.0–10.5)
nRBC: 0 % (ref 0.0–0.2)

## 2022-05-04 MED ORDER — FREE WATER
100.0000 mL | Freq: Four times a day (QID) | Status: DC
  Administered 2022-05-05 – 2022-05-16 (×40): 100 mL

## 2022-05-04 MED ORDER — LORAZEPAM 2 MG/ML IJ SOLN: 0.5000 mg | Freq: Once | INTRAMUSCULAR | Status: DC

## 2022-05-04 MED ORDER — LORAZEPAM 2 MG/ML IJ SOLN: 0.5000 mg | Freq: Once | INTRAMUSCULAR | Status: DC | PRN

## 2022-05-04 MED ORDER — OSMOLITE 1.2 CAL PO LIQD: 1000.0000 mL | ORAL | Status: DC

## 2022-05-04 MED ORDER — SODIUM CHLORIDE 0.9% FLUSH
10.0000 mL | Freq: Two times a day (BID) | INTRAVENOUS | Status: DC
  Administered 2022-05-04 – 2022-05-17 (×25): 10 mL

## 2022-05-04 MED ORDER — SODIUM CHLORIDE 0.9% FLUSH: 10.0000 mL | INTRAVENOUS | Status: DC | PRN

## 2022-05-04 NOTE — Progress Notes (Signed)
LTM EEG discontinued - no skin breakdown at [facility]. Tried to call [facility] to inform them of unhook but no answer.

## 2022-05-04 NOTE — Progress Notes (Signed)

## 2022-05-04 NOTE — Progress Notes (Signed)
PROGRESS NOTE    Crystal Spence  OAC:[id number] DOB: 08-09-1975 DOA: 04/30/2022 PCP: Trey Sailors, PA   Brief Narrative:  46 y.o. 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  CT of the head was negative for acute abnormality.  She was treated with IV Ativan and Keppra.  Neurology was consulted and patient was transferred to [facility] and started on EEG.  Assessment & Plan:   Status epilepticus -Neurology following: Currently on Vimpat, phenobarbital and Keppra  Plan is for MRI once LTM has been discontinued. Neurology continues to follow.  Oropharyngeal dysphagia Seen by speech therapy.  Not cleared for oral intake due to high aspiration risk.  Will order NG tube and tube feedings.  Will need to be restrains as patient has been pulling on IV her EEG leads.  Influenza A  bronchitis -Diagnosed on 04/29/2022 -Continue droplet precautions  -Has not not been able to get Tamiflu because of being n.p.o. Has been afebrile.  Respiratory status is stable.  Does not need antivirals anymore.  Acute respiratory failure with hypoxia -Possibly from above.  Initially required supplemental oxygen up to 5 L/min.  Now saturating normal on room air.  Hypertensive urgency Blood pressure has improved.  Noted to be on IV metoprolol as needed.  Will initiate enteral medications once we have NG tube in place.  Leukocytosis -Possibly reactive.   Hypothyroidism Continue levothyroxine once NG tube has been placed.  Hypokalemia -Improved.  History of laryngeal cancer -Cancer free for many years, discharged from oncology follow-up in 2020     DVT prophylaxis: Lovenox Code Status: Full Family Communication: Daughter at  bedside Disposition Plan: To be determined  Status is: Inpatient Remains inpatient appropriate because: Status epilepticus    Consultants: Neurology  Procedures: LTM EEG  Antimicrobials: None   Subjective: Patient noted to be awake.  Answers a few questions.  Follows certain commands.  Remains distracted.  Objective: Vitals:   05/04/22 0000 05/04/22 0400 05/04/22 0500 05/04/22 0828  BP: (!) 149/113 (!) 159/138  (!) 162/110  Pulse: 97 98  74  Resp: '18 18  20  '$ Temp: 98.1 F (36.7 C) 98.2 F (36.8 C)  97.6 F (36.4 C)  TempSrc: Oral Oral  Oral  SpO2: 100% 100%  100%  Weight:   41.8 kg   Height:        Intake/Output Summary (Last 24 hours) at 05/04/2022 1043 Last data filed at 05/04/2022 0831 Gross per 24 hour  Intake 2363.98 ml  Output 900 ml  Net 1463.98 ml    Filed Weights   05/02/22 0448 05/03/22 0500 05/04/22 0500  Weight: 40 kg 40.4 kg 41.8 kg    Examination:  General appearance: Awake alert.  In no distress.  Distracted Resp: Clear to auscultation bilaterally.  Normal effort Cardio: S1-S2 is normal regular.  No S3-S4.  No rubs murmurs or bruit GI: Abdomen is soft.  Nontender nondistended.  Bowel sounds are present normal.  No masses organomegaly Extremities: No edema.  Moving all of her extremities but physical deconditioning is noted.    Data Reviewed: I have personally reviewed following labs and imaging studies  CBC: Recent Labs  Lab 04/30/22 0818 04/30/22 1617 05/01/22 0045 05/01/22 0631 05/02/22 0508 05/03/22 0359 05/04/22 0252  WBC 8.2  --  22.9* 17.2* 18.4* 16.5* 12.5*  NEUTROABS 7.7  --  18.4*  --   --   --  11.3*  HGB 13.5   < > 14.1 15.4* 17.9* 15.0 14.8  HCT 39.7   < > 43.0 45.4 54.0* 44.6 44.6  MCV 94.5  --  98.9 97.0 97.3 95.9 97.6  PLT 367  --  355 306 233 314 305   < > = values in this interval not displayed.    Basic Metabolic Panel: Recent Labs  Lab 04/30/22 0818 04/30/22 1617 05/01/22 0045 05/01/22 1337  05/02/22 0728 05/03/22 0359  NA 134* 135 135 137 136 142  K 2.6* 3.7 3.1* 3.1* 2.9* 3.9  CL 95* 104 95* 102 102 110  CO2 28  --  30 21* 20* 22  GLUCOSE 151* 86 128* 77 85 124*  BUN 7 4* 7 5* 7 8  CREATININE 0.73 0.50 0.79 0.76 1.00 0.91  CALCIUM 8.8*  --  8.1* 7.4* 7.5* 8.5*  MG  --   --  2.5* 1.8 2.0  --   PHOS  --   --   --  1.9*  --   --     GFR: Estimated Creatinine Clearance: 49.9 mL/min (by C-G formula based on SCr of 0.91 mg/dL). Liver Function Tests: Recent Labs  Lab 05/01/22 0045 05/01/22 1337 05/02/22 0728 05/03/22 0359  AST 45* '29 23 26  '$ ALT '17 14 13 13  '$ ALKPHOS 111 95 87 93  BILITOT 1.3* 1.0 1.2 0.9  PROT 6.2* 5.5* 5.7* 6.1*  ALBUMIN 3.2* 2.7* 2.5* 2.8*     CBG: Recent Labs  Lab 05/03/22 1322 05/03/22 1743 05/03/22 2021 05/03/22 2324 05/04/22 0321  GLUCAP 89 87 91 103* 92      Recent Results (from the past 240 hour(s))  Resp panel by RT-PCR (RSV, Flu A&B, Covid) Anterior Nasal Swab     Status: Abnormal   Collection Time: 04/29/22  8:02 PM   Specimen: Anterior Nasal Swab  Result Value Ref Range Status   SARS Coronavirus 2 by RT PCR NEGATIVE NEGATIVE Final    Comment: (NOTE) SARS-CoV-2 target nucleic acids are NOT DETECTED.  The SARS-CoV-2 RNA is generally detectable in upper respiratory specimens during the acute phase of infection. The lowest concentration of SARS-CoV-2 viral copies this assay can detect is 138 copies/mL. A negative result does not preclude SARS-Cov-2 infection and should not be used as the sole basis for treatment or other patient management decisions. A negative result may occur with  improper specimen collection/handling, submission of specimen other than nasopharyngeal swab, presence of viral mutation(s) within the areas targeted by this assay, and inadequate number of viral copies(<138 copies/mL). A negative result must be combined with clinical observations, patient history, and epidemiological information. The  expected result is Negative.  Fact Sheet for Patients:  EntrepreneurPulse.com.au  Fact Sheet for Healthcare Providers:  IncredibleEmployment.be  This test is no t yet approved or cleared by the [place] FDA and  has been authorized for detection and/or diagnosis of SARS-CoV-2 by FDA under an Emergency Use Authorization (EUA). This EUA will remain  in effect (meaning this test can be used) for the duration of the COVID-19 declaration under Section 564(b)(1) of the Act, 21 U.S.C.section 360bbb-3(b)(1), unless the authorization is terminated  or revoked sooner.       Influenza A by PCR POSITIVE (A) NEGATIVE Final   Influenza B by PCR NEGATIVE NEGATIVE Final    Comment: (NOTE) The Xpert Xpress SARS-CoV-2/FLU/RSV plus  assay is intended as an aid in the diagnosis of influenza from Nasopharyngeal swab specimens and should not be used as a sole basis for treatment. Nasal washings and aspirates are unacceptable for Xpert Xpress SARS-CoV-2/FLU/RSV testing.  Fact Sheet for Patients: EntrepreneurPulse.com.au  Fact Sheet for Healthcare Providers: IncredibleEmployment.be  This test is not yet approved or cleared by the [place] FDA and has been authorized for detection and/or diagnosis of SARS-CoV-2 by FDA under an Emergency Use Authorization (EUA). This EUA will remain in effect (meaning this test can be used) for the duration of the COVID-19 declaration under Section 564(b)(1) of the Act, 21 U.S.C. section 360bbb-3(b)(1), unless the authorization is terminated or revoked.     Resp Syncytial Virus by PCR NEGATIVE NEGATIVE Final    Comment: (NOTE) Fact Sheet for Patients: EntrepreneurPulse.com.au  Fact Sheet for Healthcare Providers: IncredibleEmployment.be  This test is not yet approved or cleared by the [place] FDA and has been authorized for detection and/or  diagnosis of SARS-CoV-2 by FDA under an Emergency Use Authorization (EUA). This EUA will remain in effect (meaning this test can be used) for the duration of the COVID-19 declaration under Section 564(b)(1) of the Act, 21 U.S.C. section 360bbb-3(b)(1), unless the authorization is terminated or revoked.  Performed at [facility], [place] [address] [place], [place] [address]          Radiology Studies: No results found.  Scheduled Meds:  enoxaparin (LOVENOX) injection  20 mg Subcutaneous Q24H   levothyroxine  75 mcg Oral QAC breakfast   oseltamivir  30 mg Oral Q12H   PHENObarbital  130 mg Intravenous BID   sodium chloride flush  3 mL Intravenous Q12H   thiamine (VITAMIN B1) injection  100 mg Intravenous Daily   Continuous Infusions:  dextrose 5 % and 0.9 % NaCl with KCl 40 mEq/L 75 mL/hr at 05/03/22 2136   lacosamide (VIMPAT) IV 100 mg (05/04/22 0144)   levETIRAcetam 1,000 mg (05/04/22 0946)    Bonnielee Haff, MD [facility] 05/04/2022, 10:43 AM

## 2022-05-04 NOTE — Progress Notes (Signed)
Speech Language Pathology Treatment: Dysphagia  Patient Details Name: Crystal Spence MRN: [id number] DOB: Oct 22, 1975 Today's Date: 05/04/2022 Time: 6812-7517 SLP Time Calculation (min) (ACUTE ONLY): 23 min  Assessment / Plan / Recommendation Clinical Impression  Pt seen for dysphagia f/u tx session to assess PO readiness.  Pt with improved mentation, but continues to be confused and exhibit decreased sustained attention requiring mod verbal/tactile cues for maintaining attention/following simple commands or answering simple questions.  Pt was able to state name, but frequently either called out for family members or spelled various people's names during session unrelated to questions asked by SLP.  Pt allowed min oral care completion d/t biting swab or not opening mouth fully for completion.  Congested cough noted prior to any PO intake.  Pt given ice chips, 1/2 tsp thin liquids and puree consistency this session with a delayed swallow initiation. Pt attempted to speak during oral intake which increased her risk for aspiration.  Delayed cough noted after cup sip of thin, but not with thin via 1/2 tsp.  Puree with delayed oral propulsion and oral holding, but swallow was initiated.  Due to pt's continued impaired mentation and risk for aspiration, recommend allowing ice chips/1/2 tsp amounts of thin liquids after oral care completion, but NPO status safest option at this time.  ST will continue to f/u in acute setting for PO readiness.   HPI HPI: Per MD note, "46 y.o. 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 CT of the head was negative for acute abnormality. She was treated with IV Ativan and Keppra. Neurology was consulted and patient was  transferred to [facility] and started on EEG.". Swallow eval ordered. Pt had a trach and PEG in 2015 with cancer diagnosis, tx. She also has a paralyzed vocal cord - [name] note from [place] 2019- "Nasopharynx clear. Oropharynx, hypopharynx and larynx with diffuse radiation changes. Left cord does not move. Right side move well. Both cords look healthy. No masses identified. Diffuse radiation changes distort the entire laryngeal anatomy." [name] scoping pt; BSE completed with pt remaining NPO d/t mentation primarily. ST f/u for PO readiness.      SLP Plan  Continue with current plan of care      Recommendations for follow up therapy are one component of a multi-disciplinary discharge planning process, led by the attending physician.  Recommendations may be updated based on patient status, additional functional criteria and insurance authorization.    Recommendations  Diet recommendations: NPO;Other(comment) (ice chips;tsp thin allowed) Medication Administration: Via alternative means                Oral Care Recommendations: Oral care QID;Oral care prior to ice chip/H20 Follow Up Recommendations: Skilled nursing-short term rehab (<3 hours/day) Assistance recommended at discharge: Frequent or constant Supervision/Assistance SLP Visit Diagnosis: Dysphagia, oropharyngeal phase (R13.12) Plan: Continue with current plan of care           Crystal Spence, M.S., CCC-SLP  05/04/2022, 10:52 AM

## 2022-05-04 NOTE — Procedures (Signed)
Patient Name: Crystal Spence  MRN: [id number]  Epilepsy Attending: Lora Havens  Referring Physician/Provider: Greta Doom, MD  Duration: 05/04/2022 0233 to 05/04/2022 0933   Patient history: 46 year old female being evaluated for nonconvulsive status epilepticus.   Level of alertness: Awake, asleep   AEDs during EEG study: LEV, LCM, Phenobarb   Technical aspects: This EEG study was done with scalp electrodes positioned according to the 10-20 International system of electrode placement. Electrical activity was reviewed with band pass filter of 1-'70Hz'$ , sensitivity of 7 uV/mm, display speed of 5m/sec with a '60Hz'$  notched filter applied as appropriate. EEG data were recorded continuously and digitally stored.  Video monitoring was available and reviewed as appropriate.   Description: During awake state, no clear posterior dominant rhythm was seen. Sleep was characterized by vertex waves, sleep spindles (12 to 14 Hz), maximal frontocentral region. EEG showed continuous generalized and lateralized left hemisphere 3 to 5 Hz theta-delta slowing as well as 12 to 15 Hz beta activity seen predominantly in right hemisphere. Sharp waves were noted in left hemisphere, maximal left fronto-central region which appeared quasiperiodic at 0.5 to 1 Hz. Hyperventilation and photic stimulation were not performed.      ABNORMALITY - Sharp wave, left fronto-central region - Continuous slow, generalized and lateralized left hemisphere   IMPRESSION: This study showed evidence of epileptogenicity and cortical dysfunction arising from left hemisphere, maximal left fronto-central region likely due to underlying structural abnormality. Additionally there is moderate to severe diffuse encephalopathy, nonspecific to etiology.  No definite seizures were seen during the study.   [name]

## 2022-05-04 NOTE — Plan of Care (Signed)
  Problem: Clinical Measurements: Goal: Will remain free from infection Outcome: Progressing   Problem: Clinical Measurements: Goal: Diagnostic test results will improve Outcome: Progressing   Problem: Clinical Measurements: Goal: Respiratory complications will improve Outcome: Progressing   Problem: Activity: Goal: Risk for activity intolerance will decrease Outcome: Progressing   Problem: Pain Managment: Goal: General experience of comfort will improve Outcome: Progressing   Problem: Safety: Goal: Ability to remain free from injury will improve Outcome: Progressing

## 2022-05-05 ENCOUNTER — Inpatient Hospital Stay (HOSPITAL_COMMUNITY): Payer: Medicaid Other

## 2022-05-05 DIAGNOSIS — G40901 Epilepsy, unspecified, not intractable, with status epilepticus: Secondary | ICD-10-CM | POA: Diagnosis not present

## 2022-05-05 DIAGNOSIS — I10 Essential (primary) hypertension: Secondary | ICD-10-CM | POA: Diagnosis not present

## 2022-05-05 DIAGNOSIS — R1312 Dysphagia, oropharyngeal phase: Secondary | ICD-10-CM | POA: Diagnosis not present

## 2022-05-05 LAB — CBC
HCT: 43.7 % (ref 36.0–46.0)
Hemoglobin: 14.3 g/dL (ref 12.0–15.0)
MCH: 32 pg (ref 26.0–34.0)
MCHC: 32.7 g/dL (ref 30.0–36.0)
MCV: 97.8 fL (ref 80.0–100.0)
Platelets: 280 10*3/uL (ref 150–400)
RBC: 4.47 MIL/uL (ref 3.87–5.11)
RDW: 16.2 % — ABNORMAL HIGH (ref 11.5–15.5)
WBC: 7.2 10*3/uL (ref 4.0–10.5)
nRBC: 0 % (ref 0.0–0.2)

## 2022-05-05 LAB — COMPREHENSIVE METABOLIC PANEL
ALT: 14 U/L (ref 0–44)
AST: 26 U/L (ref 15–41)
Albumin: 2.6 g/dL — ABNORMAL LOW (ref 3.5–5.0)
Alkaline Phosphatase: 82 U/L (ref 38–126)
Anion gap: 4 — ABNORMAL LOW (ref 5–15)
BUN: <5 mg/dL — ABNORMAL LOW (ref 6–20)
CO2: 22 mmol/L (ref 22–32)
Calcium: 7.9 mg/dL — ABNORMAL LOW (ref 8.9–10.3)
Chloride: 115 mmol/L — ABNORMAL HIGH (ref 98–111)
Creatinine, Ser: 0.69 mg/dL (ref 0.44–1.00)
GFR, Estimated: >60 mL/min (ref >60)
Glucose, Bld: 92 mg/dL (ref 70–99)
Potassium: 3.6 mmol/L (ref 3.5–5.1)
Sodium: 141 mmol/L (ref 135–145)
Total Bilirubin: 0.9 mg/dL (ref 0.3–1.2)
Total Protein: 5.8 g/dL — ABNORMAL LOW (ref 6.5–8.1)

## 2022-05-05 LAB — GLUCOSE, CAPILLARY
Glucose-Capillary: 86 mg/dL (ref 70–99)
Glucose-Capillary: 93 mg/dL (ref 70–99)
Glucose-Capillary: 91 mg/dL (ref 70–99)
Glucose-Capillary: 94 mg/dL (ref 70–99)
Glucose-Capillary: 118 mg/dL — ABNORMAL HIGH (ref 70–99)

## 2022-05-05 LAB — MAGNESIUM: Magnesium: 1.3 mg/dL — ABNORMAL LOW (ref 1.7–2.4)

## 2022-05-05 LAB — PHOSPHORUS: Phosphorus: <1 mg/dL — CL (ref 2.5–4.6)

## 2022-05-05 MED ORDER — LEVOTHYROXINE SODIUM 75 MCG PO TABS
75.0000 ug | ORAL_TABLET | Freq: Every day | ORAL | Status: DC
  Administered 2022-05-06 – 2022-05-17 (×11): 75 ug
  Filled 2022-05-05 (×11): qty 1

## 2022-05-05 MED ORDER — ONDANSETRON HCL 4 MG/2ML IJ SOLN
4.0000 mg | Freq: Four times a day (QID) | INTRAMUSCULAR | Status: DC | PRN
  Administered 2022-05-10 – 2022-05-12 (×4): 4 mg via INTRAVENOUS
  Filled 2022-05-05 (×4): qty 2

## 2022-05-05 MED ORDER — DILTIAZEM 12 MG/ML ORAL SUSPENSION
30.0000 mg | Freq: Three times a day (TID) | ORAL | Status: DC
  Administered 2022-05-05 – 2022-05-15 (×29): 30 mg
  Filled 2022-05-05 (×31): qty 2.5

## 2022-05-05 MED ORDER — OSMOLITE 1.2 CAL PO LIQD
1000.0000 mL | ORAL | Status: DC
  Administered 2022-05-05 – 2022-05-14 (×9): 1000 mL
  Filled 2022-05-05 (×5): qty 1000

## 2022-05-05 MED ORDER — MAGNESIUM SULFATE 4 GM/100ML IV SOLN
4.0000 g | Freq: Once | INTRAVENOUS | Status: AC
  Administered 2022-05-05: 4 g via INTRAVENOUS
  Filled 2022-05-05: qty 100

## 2022-05-05 MED ORDER — SENNOSIDES-DOCUSATE SODIUM 8.6-50 MG PO TABS
1.0000 | ORAL_TABLET | Freq: Every evening | ORAL | Status: DC | PRN
  Administered 2022-05-07: 1
  Filled 2022-05-05: qty 1

## 2022-05-05 MED ORDER — POTASSIUM PHOSPHATES 15 MMOLE/5ML IV SOLN
45.0000 mmol | Freq: Once | INTRAVENOUS | Status: AC
  Administered 2022-05-05: 45 mmol via INTRAVENOUS
  Filled 2022-05-05: qty 15

## 2022-05-05 MED ORDER — ONDANSETRON HCL 4 MG PO TABS
4.0000 mg | ORAL_TABLET | Freq: Four times a day (QID) | ORAL | Status: DC | PRN
  Administered 2022-05-13: 4 mg
  Filled 2022-05-05: qty 1

## 2022-05-05 NOTE — Progress Notes (Signed)
PROGRESS NOTE    Crystal Spence  UMP:[id number] DOB: 1975-12-09 DOA: 04/30/2022 PCP: Trey Sailors, PA   Brief Narrative:  46 y.o. 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  CT of the head was negative for acute abnormality.  She was treated with IV Ativan and Keppra.  Neurology was consulted and patient was transferred to [facility] and started on EEG.  Assessment & Plan:   Status epilepticus Neurology following: Currently on Vimpat, phenobarbital and Keppra  Patient now off of LTM.  MRI brain is pending. Neurology continues to follow.  Oropharyngeal dysphagia Seen by speech therapy.  Not cleared for oral intake due to high aspiration risk.   NG tube and tube feedings were ordered on 12/23.   Will need to be restrains as patient has been pulling on IV and her EEG leads.  Influenza A  bronchitis -Diagnosed on 04/29/2022 -Continue droplet precautions  -Has not not been able to get Tamiflu because of being n.p.o. Has been afebrile.  Respiratory status is stable.  Does not need antivirals anymore.  Acute respiratory failure with hypoxia Possibly from above.  Initially required supplemental oxygen up to 5 L/min.  Now saturating normal on room air.  Hypertensive urgency Blood pressure has improved.  Noted to be on IV metoprolol as needed.  Will initiate enteral medications once we have NG tube in place.  Leukocytosis -Possibly reactive.  Resolved.  Hypophosphatemia and hypomagnesemia Supplemented.  Recheck labs tomorrow.  Hypothyroidism Continue levothyroxine once NG tube has been placed.  Hypokalemia Improved.  History of laryngeal cancer Cancer free for many years, discharged from oncology follow-up in  2020     DVT prophylaxis: Lovenox Code Status: Full Family Communication: Daughter at bedside Disposition Plan: Eventually she will need to go to skilled nursing facility.  Will need to sort out feeding issues prior to discharge.  Status is: Inpatient Remains inpatient appropriate because: Status epilepticus    Consultants: Neurology  Procedures: LTM EEG  Antimicrobials: None   Subjective: Patient asleep this morning.  Daughter is at the bedside.  No overnight events noted.  Objective: Vitals:   05/05/22 0033 05/05/22 0356 05/05/22 0547 05/05/22 0905  BP: (!) 157/101 (!) 153/121 (!) 154/104 (!) 151/108  Pulse: 93 100 99 93  Resp:  20  20  Temp:  98.4 F (36.9 C)  (!) 97.5 F (36.4 C)  TempSrc:  Oral  Oral  SpO2:  95%  93%  Weight:   40.3 kg   Height:        Intake/Output Summary (Last 24 hours) at 05/05/2022 1016 Last data filed at 05/05/2022 0647 Gross per 24 hour  Intake --  Output 1100 ml  Net -1100 ml    Filed Weights   05/03/22 0500 05/04/22 0500 05/05/22 0547  Weight: 40.4 kg 41.8 kg 40.3 kg    Examination:  General appearance: Patient is asleep.  In no distress Resp: Clear to auscultation bilaterally.  Normal effort Cardio: S1-S2 is normal regular.  No S3-S4.  No rubs murmurs or bruit GI: Abdomen is soft.  Nontender nondistended.  Bowel sounds are present normal.  No masses organomegaly     Data Reviewed: I have personally reviewed following labs and imaging studies  CBC: Recent Labs  Lab 04/30/22 0818  04/30/22 1617 05/01/22 0045 05/01/22 0631 05/02/22 0508 05/03/22 0359 05/04/22 0252 05/05/22 0313  WBC 8.2  --  22.9* 17.2* 18.4* 16.5* 12.5* 7.2  NEUTROABS 7.7  --  18.4*  --   --   --  11.3*  --   HGB 13.5   < > 14.1 15.4* 17.9* 15.0 14.8 14.3  HCT 39.7   < > 43.0 45.4 54.0* 44.6 44.6 43.7  MCV 94.5  --  98.9 97.0 97.3 95.9 97.6 97.8  PLT 367  --  355 306 233 314 305 280   < > = values in this interval not displayed.    Basic  Metabolic Panel: Recent Labs  Lab 05/01/22 0045 05/01/22 1337 05/02/22 0728 05/03/22 0359 05/05/22 0313  NA 135 137 136 142 141  K 3.1* 3.1* 2.9* 3.9 3.6  CL 95* 102 102 110 115*  CO2 30 21* 20* 22 22  GLUCOSE 128* 77 85 124* 92  BUN 7 5* 7 8 <5*  CREATININE 0.79 0.76 1.00 0.91 0.69  CALCIUM 8.1* 7.4* 7.5* 8.5* 7.9*  MG 2.5* 1.8 2.0  --  1.3*  PHOS  --  1.9*  --   --  <1.0*    GFR: Estimated Creatinine Clearance: 54.7 mL/min (by C-G formula based on SCr of 0.69 mg/dL). Liver Function Tests: Recent Labs  Lab 05/01/22 0045 05/01/22 1337 05/02/22 0728 05/03/22 0359 05/05/22 0313  AST 45* '29 23 26 26  '$ ALT '17 14 13 13 14  '$ ALKPHOS 111 95 87 93 82  BILITOT 1.3* 1.0 1.2 0.9 0.9  PROT 6.2* 5.5* 5.7* 6.1* 5.8*  ALBUMIN 3.2* 2.7* 2.5* 2.8* 2.6*     CBG: Recent Labs  Lab 05/04/22 1621 05/04/22 2003 05/04/22 2356 05/05/22 0354 05/05/22 0911  GLUCAP 85 81 82 86 93      Recent Results (from the past 240 hour(s))  Resp panel by RT-PCR (RSV, Flu A&B, Covid) Anterior Nasal Swab     Status: Abnormal   Collection Time: 04/29/22  8:02 PM   Specimen: Anterior Nasal Swab  Result Value Ref Range Status   SARS Coronavirus 2 by RT PCR NEGATIVE NEGATIVE Final    Comment: (NOTE) SARS-CoV-2 target nucleic acids are NOT DETECTED.  The SARS-CoV-2 RNA is generally detectable in upper respiratory specimens during the acute phase of infection. The lowest concentration of SARS-CoV-2 viral copies this assay can detect is 138 copies/mL. A negative result does not preclude SARS-Cov-2 infection and should not be used as the sole basis for treatment or other patient management decisions. A negative result may occur with  improper specimen collection/handling, submission of specimen other than nasopharyngeal swab, presence of viral mutation(s) within the areas targeted by this assay, and inadequate number of viral copies(<138 copies/mL). A negative result must be combined with clinical  observations, patient history, and epidemiological information. The expected result is Negative.  Fact Sheet for Patients:  EntrepreneurPulse.com.au  Fact Sheet for Healthcare Providers:  IncredibleEmployment.be  This test is no t yet approved or cleared by the [place] FDA and  has been authorized for detection and/or diagnosis of SARS-CoV-2 by FDA under an Emergency Use Authorization (EUA). This EUA will remain  in effect (meaning this test can be used) for the duration of the COVID-19 declaration under Section 564(b)(1) of the Act, 21 U.S.C.section 360bbb-3(b)(1), unless the authorization is terminated  or revoked sooner.       Influenza A by PCR POSITIVE (A) NEGATIVE Final   Influenza B by PCR NEGATIVE  NEGATIVE Final    Comment: (NOTE) The Xpert Xpress SARS-CoV-2/FLU/RSV plus assay is intended as an aid in the diagnosis of influenza from Nasopharyngeal swab specimens and should not be used as a sole basis for treatment. Nasal washings and aspirates are unacceptable for Xpert Xpress SARS-CoV-2/FLU/RSV testing.  Fact Sheet for Patients: EntrepreneurPulse.com.au  Fact Sheet for Healthcare Providers: IncredibleEmployment.be  This test is not yet approved or cleared by the [place] FDA and has been authorized for detection and/or diagnosis of SARS-CoV-2 by FDA under an Emergency Use Authorization (EUA). This EUA will remain in effect (meaning this test can be used) for the duration of the COVID-19 declaration under Section 564(b)(1) of the Act, 21 U.S.C. section 360bbb-3(b)(1), unless the authorization is terminated or revoked.     Resp Syncytial Virus by PCR NEGATIVE NEGATIVE Final    Comment: (NOTE) Fact Sheet for Patients: EntrepreneurPulse.com.au  Fact Sheet for Healthcare Providers: IncredibleEmployment.be  This test is not yet approved or cleared  by the [place] FDA and has been authorized for detection and/or diagnosis of SARS-CoV-2 by FDA under an Emergency Use Authorization (EUA). This EUA will remain in effect (meaning this test can be used) for the duration of the COVID-19 declaration under Section 564(b)(1) of the Act, 21 U.S.C. section 360bbb-3(b)(1), unless the authorization is terminated or revoked.  Performed at [facility], [place] [address], [place], [place] [address]          Radiology Studies: No results found.  Scheduled Meds:  enoxaparin (LOVENOX) injection  20 mg Subcutaneous Q24H   free water  100 mL Per Tube Q6H   levothyroxine  75 mcg Oral QAC breakfast   LORazepam  0.5 mg Intravenous Once   oseltamivir  30 mg Oral Q12H   PHENObarbital  130 mg Intravenous BID   sodium chloride flush  10-40 mL Intracatheter Q12H   sodium chloride flush  3 mL Intravenous Q12H   thiamine (VITAMIN B1) injection  100 mg Intravenous Daily   Continuous Infusions:  dextrose 5 % and 0.9 % NaCl with KCl 40 mEq/L 75 mL/hr at 05/04/22 2300   feeding supplement (OSMOLITE 1.2 CAL)     lacosamide (VIMPAT) IV Stopped (05/04/22 2300)   levETIRAcetam Stopped (05/04/22 2224)   potassium PHOSPHATE IVPB (in mmol)      Bonnielee Haff, MD [facility] 05/05/2022, 10:16 AM

## 2022-05-05 NOTE — Progress Notes (Signed)
Initial Nutrition Assessment RD working remotely.   DOCUMENTATION CODES:   Underweight  INTERVENTION:  - check serum Mg and Phos prior to initiation of tube feeding.  - ordered Osmolite 1.2 @ 15 ml/hr to advance by 10 ml every 24 hours to reach goal rate of 55 ml/hr with 100 ml water QID.  - at goal rate, this regimen will provide 1584 kcal, 73 grams protein, and 1482 ml water.  - monitor magnesium, potassium, and phosphorus BID for at least 3 days, MD to replete as needed, as pt is at risk for refeeding syndrome given current hypomagnesemia and hypophosphatemia.  - complete NFPE when feasible.   NUTRITION DIAGNOSIS:   Increased nutrient needs related to acute illness as evidenced by estimated needs.  GOAL:   Patient will meet greater than or equal to 90% of their needs  MONITOR:   TF tolerance, Diet advancement, Labs, Weight trends  REASON FOR ASSESSMENT:   Consult Enteral/tube feeding initiation and management  ASSESSMENT:   46 y.o. 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 She tested positive for influenza A on 12/18. She presented to the ED with seizures. CT head was negative for acute abnormality. Neurology was consulted and EEG was started.  Patient noted to be a/o to self only. She has been NPO since admission. Able to talk with RN via secure chat who shares that NGT has been placed and is awaiting placement confirmation.   Patient has not been assessed by a [place] RD since 05/2017.   Weight today is 89 lb and weight has been stable since 08/14/21. No information documented in the edema section of flow sheet.   Per notes: - status epilepticus - oropharyngeal dysphagia noted by SLP and recommended patient remain NPO - will need restraints with NGT in place as patient has been pulling at IV and EEG leads - influenza A bronchitis - acute respiratory failure with hypoxia -  supplementation for hypophosphatemia and hypomagnesemia on 12/24 with plan to re-check on 12/25 - hx of laryngeal cancer, discharged from Oncology follow-up in 2020    Labs reviewed; CBGs: 86, 93, 91 mg/dl, Cl: 115 mmol/l, BUN: <5 mg/dl, Ca: 7.9 mg/dl, Phos: <1 mg/dl, Mg: 1.3 mg/dl.  Medications reviewed; 75 mcg synthroid per tube/day, 4 g IV Mg sulfate x1 run 12/24, 45 mmol IV KPhos x1 run 12/24, 100 mg IV thiamine 12/20-12/24.    NUTRITION - FOCUSED PHYSICAL EXAM:  RD working remotely.  Diet Order:   Diet Order             Diet NPO time specified Except for: Sips with Meds, Ice Chips  Diet effective now                   EDUCATION NEEDS:   No education needs have been identified at this time  Skin:  Skin Assessment: Reviewed RN Assessment  Last BM:  PTA/unknown  Height:   Ht Readings from Last 1 Encounters:  05/02/22 '5\' 1"'$  (1.549 m)    Weight:   Wt Readings from Last 1 Encounters:  05/05/22 40.3 kg     BMI:  Body mass index is 16.79 kg/m.  Estimated Nutritional Needs:  Kcal:  1300-1600 kcal Protein:  65-80 grams Fluid:  >/= 1.6 L/day     Jarome Matin, MS, RD, LDN, CNSC Clinical Dietitian PRN/Relief staff On-call/weekend pager # available in [facility]

## 2022-05-05 NOTE — Progress Notes (Signed)
Speech Language Pathology Treatment: Dysphagia  Patient Details Name: Crystal Spence MRN: [id number] DOB: 05/13/1976 Today's Date: 05/05/2022 Time: 7829-5621 SLP Time Calculation (min) (ACUTE ONLY): 20 min  Assessment / Plan / Recommendation Clinical Impression  Ms. Parcell is more alert per RN; however, there is no improvement in swallow function.  She was provided with sips of water (teaspoon and straw) and ice chips. There was notable oral holding, palpable swallow initiation, immediate coughing after all PO trials.  Her daughter was at bedside. We talked about the likelihood of aspiration at this time and plan for temporary NG feedings. We discussed her baseline dysphagia and ability to compensate, but her current decompensation associated with hospitalization.  SLP will follow and likely pursue instrumental swallow study this week. Daughter agrees with plan.    HPI HPI: Per MD note, "46 y.o. female with medical history significant for hypertension, hypothyroidism, seizure disorder, and history of laryngeal cancer who has been cancer free for many years, tested positive for influenza A on 04/30/2019 and was started on Tamiflu in the ED presented with seizures to [place]. CT of the head was negative for acute abnormality. She was treated with IV Ativan and Keppra. Neurology was consulted and patient was transferred to [facility] and started on EEG.". Swallow eval ordered. Pt had a trach and PEG in 2015 with cancer diagnosis, tx. She also has a paralyzed vocal cord - [name] note from [facility] 2019- "Nasopharynx clear. Oropharynx, hypopharynx and larynx with diffuse radiation changes. Left cord does not move. Right side move well. Both cords look healthy. No masses identified. Diffuse radiation changes distort the entire laryngeal anatomy." [name] scoping pt; BSE completed with pt remaining NPO d/t mentation primarily. ST f/u for PO readiness.      SLP Plan  Continue  with current plan of care      Recommendations for follow up therapy are one component of a multi-disciplinary discharge planning process, led by the attending physician.  Recommendations may be updated based on patient status, additional functional criteria and insurance authorization.    Recommendations  Diet recommendations: NPO (allow ice chips) Medication Administration: Via alternative means                Oral Care Recommendations: Oral care QID Assistance recommended at discharge: Frequent or constant Supervision/Assistance SLP Visit Diagnosis: Dysphagia, oropharyngeal phase (R13.12) Plan: Continue with current plan of care         Caedence Snowden L. Tivis Ringer, MA CCC/SLP Clinical Specialist - Acute Care SLP Acute Rehabilitation Services Office number [phone]   Juan Quam Laurice  05/05/2022, 9:59 AM

## 2022-05-06 ENCOUNTER — Inpatient Hospital Stay (HOSPITAL_COMMUNITY): Payer: Medicaid Other

## 2022-05-06 DIAGNOSIS — I10 Essential (primary) hypertension: Secondary | ICD-10-CM | POA: Diagnosis not present

## 2022-05-06 DIAGNOSIS — G40901 Epilepsy, unspecified, not intractable, with status epilepticus: Secondary | ICD-10-CM | POA: Diagnosis not present

## 2022-05-06 DIAGNOSIS — R1312 Dysphagia, oropharyngeal phase: Secondary | ICD-10-CM | POA: Diagnosis not present

## 2022-05-06 LAB — CBC
HCT: 39.6 % (ref 36.0–46.0)
Hemoglobin: 13.4 g/dL (ref 12.0–15.0)
MCH: 33.3 pg (ref 26.0–34.0)
MCHC: 33.8 g/dL (ref 30.0–36.0)
MCV: 98.3 fL (ref 80.0–100.0)
Platelets: 216 10*3/uL (ref 150–400)
RBC: 4.03 MIL/uL (ref 3.87–5.11)
RDW: 16.9 % — ABNORMAL HIGH (ref 11.5–15.5)
WBC: 8.6 10*3/uL (ref 4.0–10.5)
nRBC: 0 % (ref 0.0–0.2)

## 2022-05-06 LAB — GLUCOSE, CAPILLARY
Glucose-Capillary: 102 mg/dL — ABNORMAL HIGH (ref 70–99)
Glucose-Capillary: 110 mg/dL — ABNORMAL HIGH (ref 70–99)
Glucose-Capillary: 91 mg/dL (ref 70–99)
Glucose-Capillary: 93 mg/dL (ref 70–99)
Glucose-Capillary: 95 mg/dL (ref 70–99)
Glucose-Capillary: 96 mg/dL (ref 70–99)
Glucose-Capillary: 98 mg/dL (ref 70–99)

## 2022-05-06 LAB — BASIC METABOLIC PANEL
Anion gap: 7 (ref 5–15)
BUN: <5 mg/dL — ABNORMAL LOW (ref 6–20)
CO2: 19 mmol/L — ABNORMAL LOW (ref 22–32)
Calcium: 7.6 mg/dL — ABNORMAL LOW (ref 8.9–10.3)
Chloride: 118 mmol/L — ABNORMAL HIGH (ref 98–111)
Creatinine, Ser: 0.83 mg/dL (ref 0.44–1.00)
GFR, Estimated: >60 mL/min (ref >60)
Glucose, Bld: 85 mg/dL (ref 70–99)
Potassium: 4.2 mmol/L (ref 3.5–5.1)
Sodium: 144 mmol/L (ref 135–145)

## 2022-05-06 LAB — PHOSPHORUS
Phosphorus: 3.8 mg/dL (ref 2.5–4.6)
Phosphorus: 2.8 mg/dL (ref 2.5–4.6)

## 2022-05-06 LAB — MAGNESIUM
Magnesium: 2 mg/dL (ref 1.7–2.4)
Magnesium: 1.9 mg/dL (ref 1.7–2.4)

## 2022-05-06 MED ORDER — LORAZEPAM 2 MG/ML IJ SOLN: 2.0000 mg | Freq: Once | INTRAMUSCULAR | Status: DC

## 2022-05-06 NOTE — Progress Notes (Signed)
Neurology progress note  Subjective: No acute events overnight.  Daughter at bedside.  Patient more communicative than what is being described in the prior notes and was handed off to me.  Objective: Physical exam  Vital signs in last 24 hours: Temp:  [97.5 F (36.4 C)-99.3 F (37.4 C)] 99.3 F (37.4 C) (12/25 0759) Pulse Rate:  [92-98] 98 (12/25 0759) Resp:  [15-20] 15 (12/25 0759) BP: (123-158)/(91-112) 134/108 (12/25 0759) SpO2:  [93 %-99 %] 94 % (12/25 0759) Weight:  [41.2 kg] 41.2 kg (12/25 0455) General: Awake alert in no distress HEENT: Normocephalic atraumatic Lungs: Clear Cardiovascular: Regular rate rhythm Neurologic exam She is awake alert oriented x 3 Naming intact Comprehension intact Repetition intact Mildly reduced attention concentration Cranial nerves II to XII intact Motor examination with mild right hemiparesis but is antigravity strength in all fours. Sensation intact Coordination with no dysmetria  Diagnostic studies Basic Metabolic Panel: Recent Labs  Lab 05/01/22 0045 05/01/22 1337 05/02/22 0728 05/03/22 0359 05/05/22 0313 05/06/22 0711  NA 135 137 136 142 141 144  K 3.1* 3.1* 2.9* 3.9 3.6 4.2  CL 95* 102 102 110 115* 118*  CO2 30 21* 20* 22 22 19*  GLUCOSE 128* 77 85 124* 92 85  BUN 7 5* 7 8 <5* <5*  CREATININE 0.79 0.76 1.00 0.91 0.69 0.83  CALCIUM 8.1* 7.4* 7.5* 8.5* 7.9* 7.6*  MG 2.5* 1.8 2.0  --  1.3* 2.0  PHOS  --  1.9*  --   --  <1.0* 3.8     CBC: Recent Labs  Lab 04/30/22 0818 04/30/22 1617 05/01/22 0045 05/01/22 0631 05/02/22 0508 05/03/22 0359 05/04/22 0252 05/05/22 0313 05/06/22 0711  WBC 8.2  --  22.9*   < > 18.4* 16.5* 12.5* 7.2 8.6  NEUTROABS 7.7  --  18.4*  --   --   --  11.3*  --   --   HGB 13.5   < > 14.1   < > 17.9* 15.0 14.8 14.3 13.4  HCT 39.7   < > 43.0   < > 54.0* 44.6 44.6 43.7 39.6  MCV 94.5  --  98.9   < > 97.3 95.9 97.6 97.8 98.3  PLT 367  --  355   < > 233 314 305 280 216   < > = values in this  interval not displayed.    Imaging Personally reviewed-MR brain 05/05/2022-no acute abnormality.   ASSESSMENT: 46 year old female with recurrent seizures in the setting of flu and potential alcohol use/withdrawal (alcohol level less than 10 on arrival).   Breakthrough seizures likely due to flu and alcohol use and status epilepticus, status epilepticus has resolved electrographically as well as clinically. Epilepsy Alcohol use disorder with alcohol withdrawal Hallucinations   Recommendations -Continue phenobarb '130mg'$  BID, Keppra 1000 mg twice daily and Vimpat 100 mg twice daily -Continue CIWA protocol -Still has an NG tube-May need repeat swallow evaluation with her mentation and aphasia getting better. -Continue seizure precautions -As needed IV Ativan 2 mg for clinical seizure-like activity -Management of rest of comorbidities per primary team -Discussed plan with family and RN at bedside as well as Dr. [place] Pink on the floor  -- Amie Portland, MD Neurologist [facility] Pager: [phone]

## 2022-05-06 NOTE — Progress Notes (Signed)
PROGRESS NOTE    Crystal Spence  YIR:[id number] DOB: 29-Nov-1975 DOA: 04/30/2022 PCP: Trey Sailors, PA   Brief Narrative:  46 y.o. female with medical history significant for hypertension, hypothyroidism, seizure disorder, and history of laryngeal cancer who has been cancer free for many years, tested positive for influenza A on 04/30/2019 and was started on Tamiflu in the ED presented with seizures to [place].  CT of the head was negative for acute abnormality.  She was treated with IV Ativan and Keppra.  Neurology was consulted and patient was transferred to [facility] and started on EEG.  Assessment & Plan:   Status epilepticus Neurology following: Currently on Vimpat, phenobarbital and Keppra  Patient now off of LTM.  MRI brain without any acute findings or lesions. Neurology continues to follow. Mentation appears to be gradually improving. Should be able to change her medications to enteral route in the next 24 hours.  Oropharyngeal dysphagia Seen by speech therapy.  Not cleared for oral intake due to high aspiration risk.   NG tube was placed and tube feedings were initiated on 12/24. May need to be restrained as patient has been pulling on IV and her EEG leads.  Influenza A  bronchitis -Diagnosed on 04/29/2022 -Continue droplet precautions  -Was not not been able to get Tamiflu because of being n.p.o. Has been afebrile.  Respiratory status is stable.  Does not need antivirals anymore.  Acute respiratory failure with hypoxia Possibly from above.  Initially required supplemental oxygen up to 5 L/min.  Now saturating normal on room air.  Essential hypertension Started back on diltiazem via tube.  Monitor blood pressures closely.    Leukocytosis -Possibly reactive.  Resolved.  Hypophosphatemia and hypomagnesemia Supplemented.  Hypothyroidism Continue levothyroxine  Hypokalemia Improved.  History of laryngeal cancer Cancer free for  many years, discharged from oncology follow-up in 2020     DVT prophylaxis: Lovenox Code Status: Full Family Communication: Daughter at bedside Disposition Plan: Eventually she will need to go to skilled nursing facility.  Will need to sort out feeding issues prior to discharge.  Status is: Inpatient Remains inpatient appropriate because: Status epilepticus    Consultants: Neurology  Procedures: LTM EEG  Antimicrobials: None   Subjective: Patient is awake this morning.  Denies any complaints.  Does mention that she wants something to drink.  She was explained the reason why she cannot have anything to drink by mouth.  Objective: Vitals:   05/06/22 0002 05/06/22 0416 05/06/22 0455 05/06/22 0759  BP: (!) 142/91 (!) 123/94  (!) 134/108  Pulse: 92 96  98  Resp: '15 17  15  '$ Temp: 98.2 F (36.8 C) 98.4 F (36.9 C)  99.3 F (37.4 C)  TempSrc: Oral Oral  Oral  SpO2: 98% 95%  94%  Weight:   41.2 kg   Height:        Intake/Output Summary (Last 24 hours) at 05/06/2022 1028 Last data filed at 05/06/2022 0600 Gross per 24 hour  Intake 150 ml  Output 476 ml  Net -326 ml    Filed Weights   05/04/22 0500 05/05/22 0547 05/06/22 0455  Weight: 41.8 kg 40.3 kg 41.2 kg    Examination:  General appearance: Awake alert.  In no distress Resp: Clear to auscultation bilaterally.  Normal effort Cardio: S1-S2 is normal regular.  No S3-S4.  No rubs murmurs or bruit GI: Abdomen is soft.  Nontender nondistended.  Bowel sounds are present normal.  No masses organomegaly  Data Reviewed: I have personally reviewed following labs and imaging studies  CBC: Recent Labs  Lab 04/30/22 0818 04/30/22 1617 05/01/22 0045 05/01/22 0631 05/02/22 0508 05/03/22 0359 05/04/22 0252 05/05/22 0313 05/06/22 0711  WBC 8.2  --  22.9*   < > 18.4* 16.5* 12.5* 7.2 8.6  NEUTROABS 7.7  --  18.4*  --   --   --  11.3*  --   --   HGB 13.5   < > 14.1   < > 17.9* 15.0 14.8 14.3 13.4  HCT 39.7   <  > 43.0   < > 54.0* 44.6 44.6 43.7 39.6  MCV 94.5  --  98.9   < > 97.3 95.9 97.6 97.8 98.3  PLT 367  --  355   < > 233 314 305 280 216   < > = values in this interval not displayed.    Basic Metabolic Panel: Recent Labs  Lab 05/01/22 0045 05/01/22 1337 05/02/22 0728 05/03/22 0359 05/05/22 0313 05/06/22 0711  NA 135 137 136 142 141 144  K 3.1* 3.1* 2.9* 3.9 3.6 4.2  CL 95* 102 102 110 115* 118*  CO2 30 21* 20* 22 22 19*  GLUCOSE 128* 77 85 124* 92 85  BUN 7 5* 7 8 <5* <5*  CREATININE 0.79 0.76 1.00 0.91 0.69 0.83  CALCIUM 8.1* 7.4* 7.5* 8.5* 7.9* 7.6*  MG 2.5* 1.8 2.0  --  1.3* 2.0  PHOS  --  1.9*  --   --  <1.0* 3.8    GFR: Estimated Creatinine Clearance: 53.9 mL/min (by C-G formula based on SCr of 0.83 mg/dL). Liver Function Tests: Recent Labs  Lab 05/01/22 0045 05/01/22 1337 05/02/22 0728 05/03/22 0359 05/05/22 0313  AST 45* '29 23 26 26  '$ ALT '17 14 13 13 14  '$ ALKPHOS 111 95 87 93 82  BILITOT 1.3* 1.0 1.2 0.9 0.9  PROT 6.2* 5.5* 5.7* 6.1* 5.8*  ALBUMIN 3.2* 2.7* 2.5* 2.8* 2.6*     CBG: Recent Labs  Lab 05/05/22 1706 05/05/22 1950 05/06/22 0010 05/06/22 0419 05/06/22 0756  GLUCAP 94 118* 93 95 91      Recent Results (from the past 240 hour(s))  Resp panel by RT-PCR (RSV, Flu A&B, Covid) Anterior Nasal Swab     Status: Abnormal   Collection Time: 04/29/22  8:02 PM   Specimen: Anterior Nasal Swab  Result Value Ref Range Status   SARS Coronavirus 2 by RT PCR NEGATIVE NEGATIVE Final    Comment: (NOTE) SARS-CoV-2 target nucleic acids are NOT DETECTED.  The SARS-CoV-2 RNA is generally detectable in upper respiratory specimens during the acute phase of infection. The lowest concentration of SARS-CoV-2 viral copies this assay can detect is 138 copies/mL. A negative result does not preclude SARS-Cov-2 infection and should not be used as the sole basis for treatment or other patient management decisions. A negative result may occur with  improper specimen  collection/handling, submission of specimen other than nasopharyngeal swab, presence of viral mutation(s) within the areas targeted by this assay, and inadequate number of viral copies(<138 copies/mL). A negative result must be combined with clinical observations, patient history, and epidemiological information. The expected result is Negative.  Fact Sheet for Patients:  EntrepreneurPulse.com.au  Fact Sheet for Healthcare Providers:  IncredibleEmployment.be  This test is no t yet approved or cleared by the [place] FDA and  has been authorized for detection and/or diagnosis of SARS-CoV-2 by FDA under an Emergency Use Authorization (EUA). This EUA will  remain  in effect (meaning this test can be used) for the duration of the COVID-19 declaration under Section 564(b)(1) of the Act, 21 U.S.C.section 360bbb-3(b)(1), unless the authorization is terminated  or revoked sooner.       Influenza A by PCR POSITIVE (A) NEGATIVE Final   Influenza B by PCR NEGATIVE NEGATIVE Final    Comment: (NOTE) The Xpert Xpress SARS-CoV-2/FLU/RSV plus assay is intended as an aid in the diagnosis of influenza from Nasopharyngeal swab specimens and should not be used as a sole basis for treatment. Nasal washings and aspirates are unacceptable for Xpert Xpress SARS-CoV-2/FLU/RSV testing.  Fact Sheet for Patients: EntrepreneurPulse.com.au  Fact Sheet for Healthcare Providers: IncredibleEmployment.be  This test is not yet approved or cleared by the [place] FDA and has been authorized for detection and/or diagnosis of SARS-CoV-2 by FDA under an Emergency Use Authorization (EUA). This EUA will remain in effect (meaning this test can be used) for the duration of the COVID-19 declaration under Section 564(b)(1) of the Act, 21 U.S.C. section 360bbb-3(b)(1), unless the authorization is terminated or revoked.     Resp Syncytial  Virus by PCR NEGATIVE NEGATIVE Final    Comment: (NOTE) Fact Sheet for Patients: EntrepreneurPulse.com.au  Fact Sheet for Healthcare Providers: IncredibleEmployment.be  This test is not yet approved or cleared by the [place] FDA and has been authorized for detection and/or diagnosis of SARS-CoV-2 by FDA under an Emergency Use Authorization (EUA). This EUA will remain in effect (meaning this test can be used) for the duration of the COVID-19 declaration under Section 564(b)(1) of the Act, 21 U.S.C. section 360bbb-3(b)(1), unless the authorization is terminated or revoked.  Performed at [facility], [place] [address], [place], [place] [address]          Radiology Studies: DG Abd 1 View  Result Date: 05/05/2022 CLINICAL DATA:  Placement of enteric tube EXAM: ABDOMEN - 1 VIEW COMPARISON:  Previous studies including the CT done on 01-05-2022 FINDINGS: Enteric tube is not seen in the radiograph. Bowel gas pattern in the upper abdomen is unremarkable. There are no focal infiltrates in the visualized lung fields. There are multiple calcified granulomas in both lungs. IMPRESSION: Enteric tube is not seen in the imaged. Electronically Signed   By: Elmer Picker M.D.   On: 05/05/2022 17:26   DG Abd Portable 1V  Result Date: 05/05/2022 CLINICAL DATA:  Enteric tube placement EXAM: PORTABLE ABDOMEN - 1 VIEW COMPARISON:  None Available. FINDINGS: Tip of enteric tube is seen in the region of fundus of the stomach. Bowel gas pattern in the upper abdomen is unremarkable. There is no definite evidence of pneumoperitoneum in this semi upright portable study. Lower abdomen is not included in the image. Low position of diaphragms suggests COPD. There are multiple calcified nodules in both lungs suggesting healed granulomas. There is no pleural effusion or pneumothorax. Dextroscoliosis is seen in thoracic spine. IMPRESSION: Tip of enteric  tube is seen in the fundus of the stomach. Electronically Signed   By: Elmer Picker M.D.   On: 05/05/2022 17:24   MR BRAIN WO CONTRAST  Result Date: 05/05/2022 CLINICAL DATA:  New onset seizure. EXAM: MRI HEAD WITHOUT CONTRAST TECHNIQUE: Multiplanar, multiecho pulse sequences of the brain and surrounding structures were obtained without intravenous contrast. COMPARISON:  CT head 04/30/2022 FINDINGS: Brain: There is no acute intracranial hemorrhage, extra-axial fluid collection, or acute infarct Parenchymal volume is normal. The ventricles are normal in size. Gray-white differentiation is preserved. There are scattered  small foci of FLAIR signal abnormality in the subcortical and periventricular white matter. There is no structural or migration abnormality. The corpus callosum is normally formed. The hippocampi are normal in signal and architecture. Is a single punctate chronic microhemorrhage in the right external capsule, nonspecific. The pituitary and suprasellar region are normal. There is no mass lesion. There is no mass effect or midline shift. Vascular: Normal flow voids. Skull and upper cervical spine: Normal marrow signal. Sinuses/Orbits: There is mucosal thickening with layering fluid in the right maxillary sinus. The globes and orbits are unremarkable. Other: There is mild right occipital scalp swelling, similar to the prior CT. There is a small right mastoid effusion. IMPRESSION: 1. No acute intracranial pathology or epileptogenic focus identified. 2. Mild right occipital scalp swelling, similar to the prior CT. 3. Scattered small foci of FLAIR signal abnormality in the supratentorial white matter are nonspecific but may reflect sequela of chronic small-vessel ischemic change, overall mild but accelerated for age. 4. Layering fluid in the right maxillary sinus which may reflect acute sinusitis in the correct clinical setting. Electronically Signed   By: Valetta Mole M.D.   On: 05/05/2022 16:06     Scheduled Meds:  diltiazem  30 mg Per Tube Q8H   enoxaparin (LOVENOX) injection  20 mg Subcutaneous Q24H   free water  100 mL Per Tube Q6H   levothyroxine  75 mcg Per Tube QAC breakfast   LORazepam  0.5 mg Intravenous Once   PHENObarbital  130 mg Intravenous BID   sodium chloride flush  10-40 mL Intracatheter Q12H   sodium chloride flush  3 mL Intravenous Q12H   Continuous Infusions:  feeding supplement (OSMOLITE 1.2 CAL) 15 mL/hr at 05/05/22 1940   lacosamide (VIMPAT) IV Stopped (05/05/22 2320)   levETIRAcetam Stopped (05/05/22 2244)    Bonnielee Haff, MD [facility] 05/06/2022, 10:28 AM

## 2022-05-07 DIAGNOSIS — I10 Essential (primary) hypertension: Secondary | ICD-10-CM | POA: Diagnosis not present

## 2022-05-07 DIAGNOSIS — J690 Pneumonitis due to inhalation of food and vomit: Secondary | ICD-10-CM | POA: Diagnosis not present

## 2022-05-07 DIAGNOSIS — R1312 Dysphagia, oropharyngeal phase: Secondary | ICD-10-CM | POA: Diagnosis not present

## 2022-05-07 DIAGNOSIS — G40901 Epilepsy, unspecified, not intractable, with status epilepticus: Secondary | ICD-10-CM | POA: Diagnosis not present

## 2022-05-07 LAB — PHOSPHORUS
Phosphorus: 2.9 mg/dL (ref 2.5–4.6)
Phosphorus: 3.6 mg/dL (ref 2.5–4.6)

## 2022-05-07 LAB — BASIC METABOLIC PANEL
Anion gap: 8 (ref 5–15)
BUN: 5 mg/dL — ABNORMAL LOW (ref 6–20)
CO2: 24 mmol/L (ref 22–32)
Calcium: 8.6 mg/dL — ABNORMAL LOW (ref 8.9–10.3)
Chloride: 110 mmol/L (ref 98–111)
Creatinine, Ser: 0.93 mg/dL (ref 0.44–1.00)
GFR, Estimated: >60 mL/min (ref >60)
Glucose, Bld: 111 mg/dL — ABNORMAL HIGH (ref 70–99)
Potassium: 3.6 mmol/L (ref 3.5–5.1)
Sodium: 142 mmol/L (ref 135–145)

## 2022-05-07 LAB — VITAMIN B1: Vitamin B1 (Thiamine): 151.2 nmol/L (ref 66.5–200.0)

## 2022-05-07 LAB — GLUCOSE, CAPILLARY
Glucose-Capillary: 111 mg/dL — ABNORMAL HIGH (ref 70–99)
Glucose-Capillary: 141 mg/dL — ABNORMAL HIGH (ref 70–99)
Glucose-Capillary: 125 mg/dL — ABNORMAL HIGH (ref 70–99)
Glucose-Capillary: 107 mg/dL — ABNORMAL HIGH (ref 70–99)
Glucose-Capillary: 110 mg/dL — ABNORMAL HIGH (ref 70–99)
Glucose-Capillary: 104 mg/dL — ABNORMAL HIGH (ref 70–99)

## 2022-05-07 LAB — MAGNESIUM
Magnesium: 2 mg/dL (ref 1.7–2.4)
Magnesium: 1.8 mg/dL (ref 1.7–2.4)

## 2022-05-07 MED ORDER — PHENOBARBITAL SODIUM 65 MG/ML IJ SOLN
65.0000 mg | Freq: Every day | INTRAMUSCULAR | Status: AC
  Administered 2022-05-11 – 2022-05-13 (×3): 65 mg via INTRAVENOUS
  Filled 2022-05-07 (×3): qty 1

## 2022-05-07 MED ORDER — SODIUM CHLORIDE 0.9 % IV SOLN
3.0000 g | Freq: Four times a day (QID) | INTRAVENOUS | Status: AC
  Administered 2022-05-07 – 2022-05-11 (×19): 3 g via INTRAVENOUS
  Filled 2022-05-07 (×18): qty 8

## 2022-05-07 MED ORDER — ACETAMINOPHEN 650 MG RE SUPP
650.0000 mg | Freq: Four times a day (QID) | RECTAL | Status: DC | PRN
  Filled 2022-05-07: qty 1

## 2022-05-07 MED ORDER — PHENOBARBITAL SODIUM 65 MG/ML IJ SOLN
32.5000 mg | Freq: Every day | INTRAMUSCULAR | Status: DC
  Administered 2022-05-14 – 2022-05-15 (×2): 32.5 mg via INTRAVENOUS
  Filled 2022-05-07 (×2): qty 1

## 2022-05-07 MED ORDER — ACETAMINOPHEN 325 MG PO TABS
650.0000 mg | ORAL_TABLET | Freq: Four times a day (QID) | ORAL | Status: DC | PRN
  Administered 2022-05-07 – 2022-05-16 (×13): 650 mg
  Filled 2022-05-07 (×12): qty 2

## 2022-05-07 MED ORDER — POTASSIUM CHLORIDE 20 MEQ PO PACK
40.0000 meq | PACK | Freq: Once | ORAL | Status: AC
  Administered 2022-05-07: 40 meq
  Filled 2022-05-07: qty 2

## 2022-05-07 MED ORDER — PHENOBARBITAL SODIUM 65 MG/ML IJ SOLN
65.0000 mg | Freq: Two times a day (BID) | INTRAMUSCULAR | Status: AC
  Administered 2022-05-07 – 2022-05-10 (×6): 65 mg via INTRAVENOUS
  Filled 2022-05-07 (×6): qty 1

## 2022-05-07 NOTE — Progress Notes (Signed)
RN went into patient's room to complete an assessment and administer medications as ordered. Patient had an episode of incontinence, peri-care was provided, and bedpad was changed. Patient felt hot, VS obtained. Patient febrile with temperature of 102.2 oral. Mews now red. NT notified, VS will be obtained hourly for 4 hours and then every 4 hours. Tylenol administered. Will continue to monitor. Oral care provided. MD made aware, RR-RN made aware, and assigned RN administered meds as ordered.   05/07/22 0515  Assess: MEWS Score  Temp (!) 102.2 F (39 C)  BP (!) 144/98  Pulse Rate (!) 118  ECG Heart Rate 97  Resp 19  Level of Consciousness Alert  Assess: MEWS Score  MEWS Temp 2  MEWS Systolic 0  MEWS Pulse 0  MEWS RR 0  MEWS LOC 0  MEWS Score 2  MEWS Score Color Yellow  Assess: if the MEWS score is Yellow or Red  Were vital signs taken at a resting state? Yes  Focused Assessment No change from prior assessment  Does the patient meet 2 or more of the SIRS criteria? No  Does the patient have a confirmed or suspected source of infection? Yes  Provider and Rapid Response Notified? Yes  MEWS guidelines implemented *See Row Information* Yes  Take Vital Signs  Increase Vital Sign Frequency  Red: Q 1hr X 4 then Q 4hr X 4, if remains red, continue Q 4hrs  Notify: Charge Nurse/RN  Name of Charge Nurse/RN Notified Manuela Schwartz RN  Date Charge Nurse/RN Notified 05/07/22  Time Charge Nurse/RN Notified 7116  Provider Notification  Provider Name/Title Dr. Marlowe Sax  Date Provider Notified 05/07/22  Time Provider Notified [phone]  Method of Notification [facility])  Notification Reason Critical Result (Red mews)  Notify: Rapid Response  Name of Rapid Response RN Notified Shanon Brow RR-RN  Date Rapid Response Notified 05/07/22  Time Rapid Response Notified 0529  Document  Patient Outcome Stabilized after interventions  Progress note created (see row info) Yes  Assess: SIRS CRITERIA  SIRS  Temperature  1  SIRS Pulse 1  SIRS Respirations  0  SIRS WBC 0  SIRS Score Sum  2

## 2022-05-07 NOTE — Progress Notes (Signed)
Pharmacy Antibiotic Note  Crystal Spence is a 46 y.o. female admitted on 04/30/2022 with seizures, now with concern for aspiration pneumonia.  Pharmacy has been consulted for Unasyn dosing.  Plan: Unasyn 3g IV Q6H.  Height: '5\' 1"'$  (154.9 cm) Weight: 41.6 kg (91 lb 11.4 oz) IBW/kg (Calculated) : 47.8  Temp (24hrs), Avg:100 F (37.8 C), Min:97 F (36.1 C), Max:102.2 F (39 C)  Recent Labs  Lab 05/02/22 0508 05/02/22 0728 05/03/22 0359 05/04/22 0252 05/05/22 0313 05/06/22 0711 05/07/22 0448  WBC 18.4*  --  16.5* 12.5* 7.2 8.6  --   CREATININE  --  1.00 0.91  --  0.69 0.83 0.93    Estimated Creatinine Clearance: 48.6 mL/min (by C-G formula based on SCr of 0.93 mg/dL).    No Known Allergies   Thank you for allowing pharmacy to be a part of this patient's care.  Wynona Neat, PharmD, BCPS  05/07/2022 7:07 AM

## 2022-05-07 NOTE — Progress Notes (Signed)
Upon assessing patient, RN noted that the NG tube had pulled out about 14 cm. Patient tachypnea, oxygen saturation low 87-88% on RA, and lungs have coarse crackles. Patient has a very congested cough and is unable to expel secretions. Tube feed paused. NGT advance to placement marking. External length 63cm. CXR ordered and confirmed placement of NGT. Feed, flushes and medications administered as ordered. Will monitor and suction patient as necessary.

## 2022-05-07 NOTE — Progress Notes (Addendum)
Subjective: No acute events overnight.  No seizures.  Continues to gradually improve.  ROS: negative except above  Examination  Vital signs in last 24 hours: Temp:  [97 F (36.1 C)-102.2 F (39 C)] 98.1 F (36.7 C) (12/26 0844) Pulse Rate:  [101-118] 105 (12/26 0844) Resp:  [16-20] 20 (12/26 0844) BP: (111-163)/(82-112) 113/82 (12/26 0844) SpO2:  [90 %-96 %] 96 % (12/26 0844) Weight:  [41.6 kg] 41.6 kg (12/26 0452)  General: lying in bed, NAD Neuro: Awake, alert, oriented to time place and person, able to name objects, able to follow simple commands, PERRLA, EOMI, no left-sided neglect anymore, antigravity strength in all 4 extremities with mild left hemiparesis  Basic Metabolic Panel: Recent Labs  Lab 05/01/22 1337 05/02/22 0728 05/03/22 0359 05/05/22 0313 05/06/22 0711 05/06/22 1548 05/07/22 0448  NA 137 136 142 141 144  --  142  K 3.1* 2.9* 3.9 3.6 4.2  --  3.6  CL 102 102 110 115* 118*  --  110  CO2 21* 20* 22 22 19*  --  24  GLUCOSE 77 85 124* 92 85  --  111*  BUN 5* 7 8 <5* <5*  --  5*  CREATININE 0.76 1.00 0.91 0.69 0.83  --  0.93  CALCIUM 7.4* 7.5* 8.5* 7.9* 7.6*  --  8.6*  MG 1.8 2.0  --  1.3* 2.0 1.9 2.0  PHOS 1.9*  --   --  <1.0* 3.8 2.8 2.9    CBC: Recent Labs  Lab 05/01/22 0045 05/01/22 0631 05/02/22 0508 05/03/22 0359 05/04/22 0252 05/05/22 0313 05/06/22 0711  WBC 22.9*   < > 18.4* 16.5* 12.5* 7.2 8.6  NEUTROABS 18.4*  --   --   --  11.3*  --   --   HGB 14.1   < > 17.9* 15.0 14.8 14.3 13.4  HCT 43.0   < > 54.0* 44.6 44.6 43.7 39.6  MCV 98.9   < > 97.3 95.9 97.6 97.8 98.3  PLT 355   < > 233 314 305 280 216   < > = values in this interval not displayed.    Coagulation Studies: No results for input(s): "LABPROT", "INR" in the last 72 hours.  Imaging MRI brain without contrast 05/05/2022: 1. No acute intracranial pathology or epileptogenic focus identified. 2. Mild right occipital scalp swelling, similar to the prior CT. 3. Scattered  small foci of FLAIR signal abnormality in the supratentorial white matter are nonspecific but may reflect sequela of chronic small-vessel ischemic change, overall mild but accelerated for age. 4. Layering fluid in the right maxillary sinus which may reflect acute sinusitis in the correct clinical setting.  ASSESSMENT AND PLAN: 46 year old female with recurrent seizures in the setting of flu and potential alcohol use/withdrawal (alcohol level less than 10 on arrival).   Status epilepticus, resolved Epilepsy Alcohol use disorder with alcohol withdrawal Hallucinations -Seizures likely due to flu and alcohol use   Recommendations -Wean propofol as patient is not improving. Taper order placed in epic -Continue Keppra 1000 mg twice daily and Vimpat 100 mg twice daily -AEDs can be converted to p.o. once patient is able to take p.o.  Dose conversion is 1 is to 1 -Continue seizure precautions -Of note, per speech eval, patient would benefit from PE tube placement. It is safe to proceed with PEG placement from neurology standpoint if needed and if family consents -As needed IV Ativan 2 mg for clinical seizure-like activity -Management of rest of comorbidities per primary team -Discussed  plan with RN at bedside as well as Dr. [place] Pink via secure chat -Recommend follow-up with neurology in 3 months  Seizure precautions: Per [facility] statutes, patients with seizures are not allowed to drive until they have been seizure-free for six months and cleared by a physician    Use caution when using heavy equipment or power tools. Avoid working on ladders or at heights. Take showers instead of baths. Ensure the water temperature is not too high on the home water heater. Do not go swimming alone. Do not lock yourself in a room alone (i.e. bathroom). When caring for infants or small children, sit down when holding, feeding, or changing them to minimize risk of injury to the child in the event you have a  seizure. Maintain good sleep hygiene. Avoid alcohol.    If patient has another seizure, call 911 and bring them back to the ED if: A.  The seizure lasts longer than 5 minutes.      B.  The patient doesn't wake shortly after the seizure or has new problems such as difficulty seeing, speaking or moving following the seizure C.  The patient was injured during the seizure D.  The patient has a temperature over 102 F (39C) E.  The patient vomited during the seizure and now is having trouble breathing    During the Seizure   - First, ensure adequate ventilation and place patients on the floor on their left side  Loosen clothing around the neck and ensure the airway is patent. If the patient is clenching the teeth, do not force the mouth open with any object as this can cause severe damage - Remove all items from the surrounding that can be hazardous. The patient may be oblivious to what's happening and may not even know what he or she is doing. If the patient is confused and wandering, either gently guide him/her away and block access to outside areas - Reassure the individual and be comforting - Call 911. In most cases, the seizure ends before EMS arrives. However, there are cases when seizures may last over 3 to 5 minutes. Or the individual may have developed breathing difficulties or severe injuries. If a pregnant patient or a person with diabetes develops a seizure, it is prudent to call an ambulance. - Finally, if the patient does not regain full consciousness, then call EMS. Most patients will remain confused for about 45 to 90 minutes after a seizure, so you must use judgment in calling for help. - Avoid restraints but make sure the patient is in a bed with padded side rails    After the Seizure (Postictal Stage)   After a seizure, most patients experience confusion, fatigue, muscle pain and/or a headache. Thus, one should permit the individual to sleep. For the next few days, reassurance is  essential. Being calm and helping reorient the person is also of importance.   Most seizures are painless and end spontaneously. Seizures are not harmful to others but can lead to complications such as stress on the lungs, brain and the heart. Individuals with prior lung problems may develop labored breathing and respiratory distress.    I have spent a total of  37 minutes with the patient reviewing hospital notes,  test results, labs and examining the patient as well as establishing an assessment and plan that was discussed personally with the patient.  > 50% of time was spent in direct patient care.   Zeb Comfort Epilepsy [facility] For  questions after 5pm please refer to AMION to reach the Neurologist on call

## 2022-05-07 NOTE — Progress Notes (Signed)
Speech Language Pathology Treatment: Dysphagia  Patient Details Name: Crystal Spence MRN: [id number] DOB: 15-Aug-1975 Today's Date: 05/07/2022 Time: 9373-4287 SLP Time Calculation (min) (ACUTE ONLY): 41 min  Assessment / Plan / Recommendation Clinical Impression  Today pt is fully alert and verbalizing that she is "thirsty", she continues with congested voice. Pt able to brush her teeth today and used oral suction to clear secretions. 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 SLP assisted pt to consume ice chips x5, water tsps/cup/straws, nectar thick liquids via tsp/cup.   Pt continues to demonstrate clinically judged delayed swallow, immediate and delayed congested cough across all boluses - worse with thin liquids than nectar/applesauce. She has poor awareness to immediate overt coughing, stating "That felt good"- likely indicative of chronic aspiration. Her cough fortunately is much stronger currently - but she admits to being weaker than normal, thus again tolerance of chronic aspiration likely compromised. .   Pt with weight loss prior to admission due to "poor appetite" - she had advised previously that her dysphagia contributed to weight loss. In this SLPs opinion, pt will benefit from long term alternative means of nutrition for nutritional purposes.   Recommend proceed with PEG if pt and family agreeable and have pt undergo MBS prior to dc hospital to determine diet that she may consumed. Expressed to pt that feeding tube would provide nutrition and not prevent aspiration. Spoke to neurology and RN re: above concerns and secure chatted Dr [place] Pink.    Will follow up for MBS= pt will need larger bore feeding tube removed for MBS due to baseline deficits and NG tube negatively impacting swallow/airway protection. Recommend continue ice chips and water after oral care pending MBS.    Note pt now with fever and CXR concerning for aspiration pna - CXR was clear upon admit.     HPI HPI: Per MD note, "46 y.o. 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 CT of the head was negative for acute abnormality. She was treated with IV Ativan and Keppra. Neurology was consulted and patient was transferred to [facility] and started on EEG.". Swallow eval ordered. Pt had a trach and PEG in 2015 with cancer diagnosis, tx. She also has a paralyzed vocal cord - [name] note from [place] 2019- "Nasopharynx clear. Oropharynx, hypopharynx and larynx with diffuse radiation changes. Left cord does not move. Right side move well. Both cords look healthy. No masses identified. Diffuse radiation changes distort the entire laryngeal anatomy." [name] scoping pt; BSE completed with pt remaining NPO d/t mentation primarily. ST f/u for PO readiness.      SLP Plan  Continue with current plan of care      Recommendations for follow up therapy are one component of a multi-disciplinary discharge planning process, led by the attending physician.  Recommendations may be updated based on patient status, additional functional criteria and insurance authorization.    Recommendations  Diet recommendations:  (ice chips, minimal amount of water) Medication Administration: Via alternative means Supervision: Full supervision/cueing for compensatory strategies Compensations: Slow rate;Small sips/bites;Other (Comment) (cough and expectorate if pt reflexively coughing, oral care) Postural Changes and/or Swallow Maneuvers: Seated upright  90 degrees;Upright 30-60 min after meal                Oral Care Recommendations: Oral care QID Follow Up  Recommendations: Skilled nursing-short term rehab (<3 hours/day) Assistance recommended at discharge: Frequent or constant Supervision/Assistance SLP Visit Diagnosis: Dysphagia, oropharyngeal phase (R13.12) Plan: Continue with current plan of care         Kathleen Lime, MS [place] Office [phone] Pager [phone]   Macario Golds  05/07/2022, 12:02 PM

## 2022-05-07 NOTE — Progress Notes (Signed)
Patient attempted to have a BM, feels the urge, but unable to do so. Pericare provided, purewick, mesh panties, and pad changed.

## 2022-05-07 NOTE — Progress Notes (Signed)
PROGRESS NOTE    Crystal Spence  ZOX:[id number] DOB: Feb 06, 1976 DOA: 04/30/2022 PCP: Trey Sailors, PA   Brief Narrative:  46 y.o. 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  CT of the head was negative for acute abnormality.  She was treated with IV Ativan and Keppra.  Neurology was consulted and patient was transferred to [facility] and started on EEG.  Assessment & Plan:   Status epilepticus Patient was seen by neurology.  Currently on Vimpat, phenobarbital and Keppra. Patient off of LTM.  MRI brain without any acute findings or lesions. Mentation appears to be gradually improving. Overnight events noted.  Will leave medications parenteral for now and transition to enteral route once she is a bit more stable.  Aspiration pneumonia/Acute respiratory failure with hypoxia Patient developed fever overnight.  Decreasing oxygen saturations were noted.  Placed on 2 L of oxygen by nasal cannula.  Chest x-ray suggests pneumonia.  Looks like her NG tube may have come out partially.  She likely aspirated. Blood cultures will be ordered. Will initiate Unasyn.  Monitor fever trends.  Check CBC tomorrow. She was diagnosed with influenza at admission but unlikely that influenza is responsible for current picture.  Oropharyngeal dysphagia Seen by speech therapy.  Not cleared for oral intake due to high aspiration risk.   NG tube was placed and tube feedings were initiated on 12/24. Requiring restraints.  Influenza A  bronchitis -Diagnosed on 04/29/2022 -Continue droplet precautions  -Was not not been able to get Tamiflu because of being n.p.o. She was afebrile for several days before aspiration event last night.  Did not receive  Tamiflu due to dysphagia.  No clear indication to start now.  Essential hypertension Started back on diltiazem via tube.  Blood pressures are reasonably well-controlled  Leukocytosis Thought to be reactive.  Will recheck labs tomorrow.  Hypophosphatemia and hypomagnesemia Supplemented.  Hypothyroidism Continue levothyroxine  Hypokalemia Improved.  Supplement today.  History of laryngeal cancer Cancer free for many years, discharged from oncology follow-up in 2020     DVT prophylaxis: Lovenox Code Status: Full Family Communication: Daughters at bedside Disposition Plan: Eventually she will need to go to skilled nursing facility.  Will need to sort out feeding issues prior to discharge.  Status is: Inpatient Remains inpatient appropriate because: Status epilepticus    Consultants: Neurology  Procedures: LTM EEG  Antimicrobials: None   Subjective: Overnight events noted.  Patient is awake.  Wants something to drink.  Daughter is at the bedside.  Patient does not appear to be in any discomfort or distress.    Objective: Vitals:   05/07/22 0518 05/07/22 0615 05/07/22 0736 05/07/22 0844  BP: (!) 144/98 (!) 119/91 111/85 113/82  Pulse: (!) 118 (!) 117 (!) 108 (!) 105  Resp: '19 20 20 20  '$ Temp: (!) 102.2 F (39 C) (!) 100.6 F (38.1 C) 98.9 F (37.2 C) 98.1 F (36.7 C)  TempSrc: Oral Oral Oral Oral  SpO2: 93% 91% 91% 96%  Weight:      Height:        Intake/Output Summary (Last 24 hours) at 05/07/2022 0951 Last data filed at 05/07/2022 0424 Gross per 24 hour  Intake 2264.67 ml  Output 700 ml  Net 1564.67 ml    Filed  Weights   05/05/22 0547 05/06/22 0455 05/07/22 0452  Weight: 40.3 kg 41.2 kg 41.6 kg    Examination:  General appearance: Awake alert.  In no distress.  Distracted. Resp: Mildly tachypneic.  No use of accessory muscles.  Coarse breath sounds bilaterally.  Crackles at the bases. Cardio: S1-S2 is normal regular.  No S3-S4.  No rubs murmurs or  bruit GI: Abdomen is soft.  Nontender nondistended.  Bowel sounds are present normal.  No masses organomegaly Extremities: No edema.      Data Reviewed: I have personally reviewed following labs and imaging studies  CBC: Recent Labs  Lab 05/01/22 0045 05/01/22 0631 05/02/22 0508 05/03/22 0359 05/04/22 0252 05/05/22 0313 05/06/22 0711  WBC 22.9*   < > 18.4* 16.5* 12.5* 7.2 8.6  NEUTROABS 18.4*  --   --   --  11.3*  --   --   HGB 14.1   < > 17.9* 15.0 14.8 14.3 13.4  HCT 43.0   < > 54.0* 44.6 44.6 43.7 39.6  MCV 98.9   < > 97.3 95.9 97.6 97.8 98.3  PLT 355   < > 233 314 305 280 216   < > = values in this interval not displayed.    Basic Metabolic Panel: Recent Labs  Lab 05/01/22 1337 05/02/22 0728 05/03/22 0359 05/05/22 0313 05/06/22 0711 05/06/22 1548 05/07/22 0448  NA 137 136 142 141 144  --  142  K 3.1* 2.9* 3.9 3.6 4.2  --  3.6  CL 102 102 110 115* 118*  --  110  CO2 21* 20* 22 22 19*  --  24  GLUCOSE 77 85 124* 92 85  --  111*  BUN 5* 7 8 <5* <5*  --  5*  CREATININE 0.76 1.00 0.91 0.69 0.83  --  0.93  CALCIUM 7.4* 7.5* 8.5* 7.9* 7.6*  --  8.6*  MG 1.8 2.0  --  1.3* 2.0 1.9 2.0  PHOS 1.9*  --   --  <1.0* 3.8 2.8 2.9    GFR: Estimated Creatinine Clearance: 48.6 mL/min (by C-G formula based on SCr of 0.93 mg/dL). Liver Function Tests: Recent Labs  Lab 05/01/22 0045 05/01/22 1337 05/02/22 0728 05/03/22 0359 05/05/22 0313  AST 45* '29 23 26 26  '$ ALT '17 14 13 13 14  '$ ALKPHOS 111 95 87 93 82  BILITOT 1.3* 1.0 1.2 0.9 0.9  PROT 6.2* 5.5* 5.7* 6.1* 5.8*  ALBUMIN 3.2* 2.7* 2.5* 2.8* 2.6*     CBG: Recent Labs  Lab 05/06/22 1629 05/06/22 2138 05/06/22 2346 05/07/22 0354 05/07/22 0820  GLUCAP 96 110* 102* 111* 141*      Recent Results (from the past 240 hour(s))  Resp panel by RT-PCR (RSV, Flu A&B, Covid) Anterior Nasal Swab     Status: Abnormal   Collection Time: 04/29/22  8:02 PM   Specimen: Anterior Nasal Swab  Result Value Ref Range Status    SARS Coronavirus 2 by RT PCR NEGATIVE NEGATIVE Final    Comment: (NOTE) SARS-CoV-2 target nucleic acids are NOT DETECTED.  The SARS-CoV-2 RNA is generally detectable in upper respiratory specimens during the acute phase of infection. The lowest concentration of SARS-CoV-2 viral copies this assay can detect is 138 copies/mL. A negative result does not preclude SARS-Cov-2 infection and should not be used as the sole basis for treatment or other patient management decisions. A negative result may occur with  improper specimen collection/handling, submission of specimen other than nasopharyngeal swab, presence of viral  mutation(s) within the areas targeted by this assay, and inadequate number of viral copies(<138 copies/mL). A negative result must be combined with clinical observations, patient history, and epidemiological information. The expected result is Negative.  Fact Sheet for Patients:  EntrepreneurPulse.com.au  Fact Sheet for Healthcare Providers:  IncredibleEmployment.be  This test is no t yet approved or cleared by the [place] FDA and  has been authorized for detection and/or diagnosis of SARS-CoV-2 by FDA under an Emergency Use Authorization (EUA). This EUA will remain  in effect (meaning this test can be used) for the duration of the COVID-19 declaration under Section 564(b)(1) of the Act, 21 U.S.C.section 360bbb-3(b)(1), unless the authorization is terminated  or revoked sooner.       Influenza A by PCR POSITIVE (A) NEGATIVE Final   Influenza B by PCR NEGATIVE NEGATIVE Final    Comment: (NOTE) The Xpert Xpress SARS-CoV-2/FLU/RSV plus assay is intended as an aid in the diagnosis of influenza from Nasopharyngeal swab specimens and should not be used as a sole basis for treatment. Nasal washings and aspirates are unacceptable for Xpert Xpress SARS-CoV-2/FLU/RSV testing.  Fact Sheet for  Patients: EntrepreneurPulse.com.au  Fact Sheet for Healthcare Providers: IncredibleEmployment.be  This test is not yet approved or cleared by the [place] FDA and has been authorized for detection and/or diagnosis of SARS-CoV-2 by FDA under an Emergency Use Authorization (EUA). This EUA will remain in effect (meaning this test can be used) for the duration of the COVID-19 declaration under Section 564(b)(1) of the Act, 21 U.S.C. section 360bbb-3(b)(1), unless the authorization is terminated or revoked.     Resp Syncytial Virus by PCR NEGATIVE NEGATIVE Final    Comment: (NOTE) Fact Sheet for Patients: EntrepreneurPulse.com.au  Fact Sheet for Healthcare Providers: IncredibleEmployment.be  This test is not yet approved or cleared by the [place] FDA and has been authorized for detection and/or diagnosis of SARS-CoV-2 by FDA under an Emergency Use Authorization (EUA). This EUA will remain in effect (meaning this test can be used) for the duration of the COVID-19 declaration under Section 564(b)(1) of the Act, 21 U.S.C. section 360bbb-3(b)(1), unless the authorization is terminated or revoked.  Performed at [facility], [place] [address], [place], [place] [address]          Radiology Studies: DG CHEST PORT 1 VIEW  Result Date: 05/06/2022 CLINICAL DATA:  [id number].  NGT placement. EXAM: PORTABLE CHEST 1 VIEW COMPARISON:  Portable chest 05/01/2022 FINDINGS: 10:04 p.m. NGT has been placed and terminates at the body of the stomach. The cardiomediastinal silhouette and vascular pattern are normal. The upper lung apices are clipped from the exposure. There is increased left infrahilar opacity today which could be due to atelectasis, pneumonia or aspiration. Remaining lungs are clear apart from scattered calcified granulomas. No pleural effusion is seen or measurable pneumothorax. Reverse S  shaped thoracic scoliosis is again shown. IMPRESSION: 1. NGT terminates at the body of the stomach. 2. Increased left infrahilar opacity could be due to atelectasis, pneumonia or aspiration. Attention on follow-up films recommended. Electronically Signed   By: Telford Nab M.D.   On: 05/06/2022 22:21   DG Abd 1 View  Result Date: 05/05/2022 CLINICAL DATA:  Placement of enteric tube EXAM: ABDOMEN - 1 VIEW COMPARISON:  Previous studies including the CT done on 2022/01/26 FINDINGS: Enteric tube is not seen in the radiograph. Bowel gas pattern in the upper abdomen is unremarkable. There are no focal infiltrates in the visualized lung fields. There are multiple  calcified granulomas in both lungs. IMPRESSION: Enteric tube is not seen in the imaged. Electronically Signed   By: Elmer Picker M.D.   On: 05/05/2022 17:26   DG Abd Portable 1V  Result Date: 05/05/2022 CLINICAL DATA:  Enteric tube placement EXAM: PORTABLE ABDOMEN - 1 VIEW COMPARISON:  None Available. FINDINGS: Tip of enteric tube is seen in the region of fundus of the stomach. Bowel gas pattern in the upper abdomen is unremarkable. There is no definite evidence of pneumoperitoneum in this semi upright portable study. Lower abdomen is not included in the image. Low position of diaphragms suggests COPD. There are multiple calcified nodules in both lungs suggesting healed granulomas. There is no pleural effusion or pneumothorax. Dextroscoliosis is seen in thoracic spine. IMPRESSION: Tip of enteric tube is seen in the fundus of the stomach. Electronically Signed   By: Elmer Picker M.D.   On: 05/05/2022 17:24   MR BRAIN WO CONTRAST  Result Date: 05/05/2022 CLINICAL DATA:  New onset seizure. EXAM: MRI HEAD WITHOUT CONTRAST TECHNIQUE: Multiplanar, multiecho pulse sequences of the brain and surrounding structures were obtained without intravenous contrast. COMPARISON:  CT head 04/30/2022 FINDINGS: Brain: There is no acute intracranial  hemorrhage, extra-axial fluid collection, or acute infarct Parenchymal volume is normal. The ventricles are normal in size. Gray-white differentiation is preserved. There are scattered small foci of FLAIR signal abnormality in the subcortical and periventricular white matter. There is no structural or migration abnormality. The corpus callosum is normally formed. The hippocampi are normal in signal and architecture. Is a single punctate chronic microhemorrhage in the right external capsule, nonspecific. The pituitary and suprasellar region are normal. There is no mass lesion. There is no mass effect or midline shift. Vascular: Normal flow voids. Skull and upper cervical spine: Normal marrow signal. Sinuses/Orbits: There is mucosal thickening with layering fluid in the right maxillary sinus. The globes and orbits are unremarkable. Other: There is mild right occipital scalp swelling, similar to the prior CT. There is a small right mastoid effusion. IMPRESSION: 1. No acute intracranial pathology or epileptogenic focus identified. 2. Mild right occipital scalp swelling, similar to the prior CT. 3. Scattered small foci of FLAIR signal abnormality in the supratentorial white matter are nonspecific but may reflect sequela of chronic small-vessel ischemic change, overall mild but accelerated for age. 4. Layering fluid in the right maxillary sinus which may reflect acute sinusitis in the correct clinical setting. Electronically Signed   By: Valetta Mole M.D.   On: 05/05/2022 16:06    Scheduled Meds:  diltiazem  30 mg Per Tube Q8H   enoxaparin (LOVENOX) injection  20 mg Subcutaneous Q24H   free water  100 mL Per Tube Q6H   levothyroxine  75 mcg Per Tube QAC breakfast   LORazepam  0.5 mg Intravenous Once   PHENObarbital  130 mg Intravenous BID   sodium chloride flush  10-40 mL Intracatheter Q12H   sodium chloride flush  3 mL Intravenous Q12H   Continuous Infusions:  ampicillin-sulbactam (UNASYN) IV     feeding  supplement (OSMOLITE 1.2 CAL) 25 mL/hr at 05/07/22 0424   lacosamide (VIMPAT) IV Stopped (05/06/22 2231)   levETIRAcetam 400 mL/hr at 05/07/22 0424    Bonnielee Haff, MD [facility] 05/07/2022, 9:51 AM

## 2022-05-08 ENCOUNTER — Inpatient Hospital Stay (HOSPITAL_COMMUNITY): Payer: Medicaid Other

## 2022-05-08 DIAGNOSIS — G40901 Epilepsy, unspecified, not intractable, with status epilepticus: Secondary | ICD-10-CM | POA: Diagnosis not present

## 2022-05-08 LAB — CBC
HCT: 36.9 % (ref 36.0–46.0)
Hemoglobin: 12.5 g/dL (ref 12.0–15.0)
MCH: 32.9 pg (ref 26.0–34.0)
MCHC: 33.9 g/dL (ref 30.0–36.0)
MCV: 97.1 fL (ref 80.0–100.0)
Platelets: 212 10*3/uL (ref 150–400)
RBC: 3.8 MIL/uL — ABNORMAL LOW (ref 3.87–5.11)
RDW: 17.7 % — ABNORMAL HIGH (ref 11.5–15.5)
WBC: 11.5 10*3/uL — ABNORMAL HIGH (ref 4.0–10.5)
nRBC: 0 % (ref 0.0–0.2)

## 2022-05-08 LAB — BASIC METABOLIC PANEL
Anion gap: 10 (ref 5–15)
BUN: 9 mg/dL (ref 6–20)
CO2: 22 mmol/L (ref 22–32)
Calcium: 8.5 mg/dL — ABNORMAL LOW (ref 8.9–10.3)
Chloride: 107 mmol/L (ref 98–111)
Creatinine, Ser: 0.86 mg/dL (ref 0.44–1.00)
GFR, Estimated: >60 mL/min (ref >60)
Glucose, Bld: 104 mg/dL — ABNORMAL HIGH (ref 70–99)
Potassium: 4.2 mmol/L (ref 3.5–5.1)
Sodium: 139 mmol/L (ref 135–145)

## 2022-05-08 LAB — GLUCOSE, CAPILLARY
Glucose-Capillary: 105 mg/dL — ABNORMAL HIGH (ref 70–99)
Glucose-Capillary: 123 mg/dL — ABNORMAL HIGH (ref 70–99)
Glucose-Capillary: 123 mg/dL — ABNORMAL HIGH (ref 70–99)
Glucose-Capillary: 96 mg/dL (ref 70–99)
Glucose-Capillary: 99 mg/dL (ref 70–99)

## 2022-05-08 LAB — MAGNESIUM
Magnesium: 1.8 mg/dL (ref 1.7–2.4)
Magnesium: 1.7 mg/dL (ref 1.7–2.4)

## 2022-05-08 LAB — PHOSPHORUS
Phosphorus: 3.6 mg/dL (ref 2.5–4.6)
Phosphorus: 3.7 mg/dL (ref 2.5–4.6)

## 2022-05-08 MED ORDER — POLYETHYLENE GLYCOL 3350 17 G PO PACK
17.0000 g | PACK | Freq: Two times a day (BID) | ORAL | Status: DC
  Administered 2022-05-08 – 2022-05-12 (×3): 17 g via ORAL
  Filled 2022-05-08 (×6): qty 1

## 2022-05-08 NOTE — Progress Notes (Signed)
Physical Therapy Treatment Patient Details Name: Crystal Spence MRN: [id number] DOB: [birth year] Today's Date: 05/08/2022   History of Present Illness Pt is a 46 y/o F presenting with seizures. 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 Pt awake and alert throughout session and able to follow one-step commands with increased time. Pt needing up to min assist for bed mobility and transfers sit<>stand and up to mod assist to steady during gait with HHA x1 on L. Pt continues to be limited by impaired balance/postural reactions, impaired cogntion, global weakness and fatigue. Pt continues to benefit from skilled PT services to progress toward functional mobility goals.    Recommendations for follow up therapy are one component of a multi-disciplinary discharge planning process, led by the attending physician.  Recommendations may be updated based on patient status, additional functional criteria and insurance authorization.  Follow Up Recommendations  Skilled nursing-short term rehab (<3 hours/day) Can patient physically be transported by private vehicle: No   Assistance Recommended at Discharge Frequent or constant Supervision/Assistance  Patient can return home with the following Two people to help with walking and/or transfers;Two people to help with bathing/dressing/bathroom;Direct supervision/assist for medications management;Assistance with feeding;Assistance with cooking/housework;Direct supervision/assist for financial management;Assist for transportation;Help with stairs or ramp for entrance   Equipment Recommendations  Other (comment) (TBD)    Recommendations for Other Services       Precautions / Restrictions  Precautions Precautions: Fall Restrictions Weight Bearing Restrictions: Yes     Mobility  Bed Mobility Overal bed mobility: Needs Assistance Bed Mobility: Supine to Sit     Supine to sit: Min assist     General bed mobility comments: min asssit to scoot out to EOB    Transfers Overall transfer level: Needs assistance Equipment used: 1 person hand held assist Transfers: Sit to/from Stand Sit to Stand: Min assist           General transfer comment: min assist to steady on rose from EOB and  low toilet    Ambulation/Gait Ambulation/Gait assistance: Mod assist Gait Distance (Feet): 40 Feet Assistive device: 1 person hand held assist Gait Pattern/deviations: Step-through pattern, Decreased stride length Gait velocity: decr     General Gait Details: unstable gait with HHA on L, pt reaching out for additional support, tendency for posterior bias during gait with ability to correct with cues   Stairs             Wheelchair Mobility    Modified Rankin (Stroke Patients Only)       Balance Overall balance assessment: Needs assistance Sitting-balance support: Bilateral upper extremity supported, Feet supported Sitting balance-Leahy Scale: Fair Sitting balance - Comments: able to maintain static sitting without assist   Standing balance support: Single extremity supported, During functional activity Standing balance-Leahy Scale: Poor Standing balance comment: requires external support                            Cognition Arousal/Alertness: Awake/alert Behavior During Therapy: Flat affect Overall Cognitive Status: Within Functional Limits for tasks assessed  General Comments: able to follow all commands with increased time        Exercises      General Comments General comments (skin integrity, edema, etc.): VSS on RA, pt awake and alert throughout session, able to make converstation       Pertinent Vitals/Pain Pain Assessment Pain Assessment: Faces Faces Pain Scale: No hurt Pain Intervention(s): Monitored during session    Home Living                          Prior Function            PT Goals (current goals can now be found in the care plan section) Acute Rehab PT Goals PT Goal Formulation: With patient/family Time For Goal Achievement: 05/17/22 Progress towards PT goals: Progressing toward goals    Frequency    Min 2X/week      PT Plan      Co-evaluation              AM-PAC PT "6 Clicks" Mobility   Outcome Measure  Help needed turning from your back to your side while in a flat bed without using bedrails?: A Little Help needed moving from lying on your back to sitting on the side of a flat bed without using bedrails?: A Little Help needed moving to and from a bed to a chair (including a wheelchair)?: A Little Help needed standing up from a chair using your arms (e.g., wheelchair or bedside chair)?: A Little Help needed to walk in hospital room?: A Lot Help needed climbing 3-5 steps with a railing? : Total 6 Click Score: 15    End of Session Equipment Utilized During Treatment: Gait belt Activity Tolerance: Patient tolerated treatment well Patient left: with call bell/phone within reach;in chair;with chair alarm set;Other (comment) (RN attending to pt) Nurse Communication: Mobility status PT Visit Diagnosis: Unsteadiness on feet (R26.81);Other abnormalities of gait and mobility (R26.89);Muscle weakness (generalized) (M62.81);History of falling (Z91.81);Pain Pain - Right/Left: Right (shoulder and abdomen) Pain - part of body: Shoulder (and abdomen)     Time: 1130-1150 PT Time Calculation (min) (ACUTE ONLY): 20 min  Charges:  $Therapeutic Activity: 8-22 mins                     Tamon Parkerson R. PTA Acute Rehabilitation Services Office: [place] 05/08/2022, 12:00 PM

## 2022-05-08 NOTE — Progress Notes (Addendum)
Nutrition Follow-up  DOCUMENTATION CODES:  Underweight, Severe malnutrition in context of chronic illness  INTERVENTION:  Restart tube feeding via cortrak tube: Osmolite 1.2 at 55 ml/h (1320 ml per day) 127m free water q6h Provides 1584 kcal, 73 gm protein, 1482 ml free water daily MVI with minerals daily  NUTRITION DIAGNOSIS:  Severe Malnutrition related to chronic illness (hx of throat cancer) as evidenced by severe fat depletion, severe muscle depletion. New dx established 12/27  GOAL:  Patient will meet greater than or equal to 90% of their needs - progressing, being met with TF at goal  MONITOR:  TF tolerance, Diet advancement, Labs, Weight trends, I & O's  REASON FOR ASSESSMENT:  Consult Enteral/tube feeding initiation and management  ASSESSMENT:  46y.o. 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 She tested positive for influenza A on 12/18. She presented to the ED with seizures. CT head was negative for acute abnormality. Neurology was consulted and EEG was started.  12/21, 12/22, 12/23, 12/24 - SLP evaluation, recommend NPO 12/24 - NGT placed and TF initiated 12/27 - Cortrak (gastric)  Pt resting in bed at the time of assessment. Awake and alert, but still seems a little confused. Pt reports that she has been working on her exercises given to her by therapy and wants to get stronger.   Pt had cortrak tube placed this AM. Endorses that tube is more comfortable than her NGT. XR confirms placement, discussed with RN at bedside. Also noted no BM since admission, MD to add bowel regimen.  Pt extremely thin with muscle and fat deficits present. Meets criteria for severe malnutrition.    Nutritionally Relevant Medications: Scheduled Meds:  free water  100 mL Per Tube Q6H   PHENObarbital  65 mg Intravenous BID   Continuous Infusions:  ampicillin-sulbactam (UNASYN) IV 3 g (05/08/22 0849)    feeding supplement (OSMOLITE 1.2 CAL) 1,000 mL (05/07/22 1759)   PRN Meds: ondansetron, senna-docusate  Labs Reviewed  NUTRITION - FOCUSED PHYSICAL EXAM: Flowsheet Row Most Recent Value  Orbital Region Severe depletion  Upper Arm Region Severe depletion  Thoracic and Lumbar Region Severe depletion  Buccal Region Severe depletion  Temple Region Severe depletion  Clavicle Bone Region Severe depletion  Clavicle and Acromion Bone Region Severe depletion  Scapular Bone Region Severe depletion  Dorsal Hand Severe depletion  Patellar Region Severe depletion  Anterior Thigh Region Severe depletion  Posterior Calf Region Severe depletion  Edema (RD Assessment) None  Hair Reviewed  Eyes Reviewed  Mouth Reviewed  Skin Reviewed  Nails Reviewed   Diet Order:   Diet Order             Diet NPO time specified Except for: Sips with Meds, Ice Chips  Diet effective now                   EDUCATION NEEDS:  No education needs have been identified at this time  Skin:  Skin Assessment: Reviewed RN Assessment  Last BM:  PTA/unknown  Height:  Ht Readings from Last 1 Encounters:  05/02/22 _0  (1.549 m)    Weight:  Wt Readings from Last 1 Encounters:  05/07/22 41.6 kg    Ideal Body Weight:  47.7 kg  BMI:  Body mass index is 17.33 kg/m.  Estimated Nutritional Needs:  Kcal:  1300-1600 kcal Protein:  65-80 grams Fluid:  >/= 1.6 L/day    RRanell Patrick RD, LDN Clinical Dietitian  RD pager # available in [facility]  After hours/weekend pager # available in [facility]

## 2022-05-08 NOTE — Procedures (Signed)
Cortrak  Person Inserting Tube:  Alroy Dust, Carsyn Boster L, RD Tube Type:  Cortrak - 43 inches Tube Size:  10 Tube Location:  Left nare Secured by: Bridle Technique Used to Measure Tube Placement:  Marking at nare/corner of mouth Cortrak Secured At:  63 cm   Cortrak Tube Team Note:  Consult received to place a Cortrak feeding tube.   X-ray is required, abdominal x-ray has been ordered by the Cortrak team. Please confirm tube placement before using the Cortrak tube.   If the tube becomes dislodged please keep the tube and contact the Cortrak team at [email or web address] for replacement.  If after hours and replacement cannot be delayed, place a NG tube and confirm placement with an abdominal x-ray.    Hermina Barters RD, LDN Clinical Dietitian See Shea Evans for contact information.

## 2022-05-08 NOTE — Progress Notes (Signed)
PROGRESS NOTE    Crystal Spence  WUJ:[id number] DOB: 1975/12/24 DOA: 04/30/2022 PCP: Trey Sailors, PA   Brief Narrative:  46 y.o. 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  CT of the head was negative for acute abnormality.  She was treated with IV Ativan and Keppra.  Neurology was consulted and patient was transferred to [facility] and started on EEG.  Assessment & Plan:   Status epilepticus Patient was seen by neurology.  Currently on Vimpat, phenobarbital and Keppra. Continue to wean phenobarbital per neurology recommendations Patient off of LTM.  MRI brain without any acute findings or lesions. Mentation appears to be gradually improving. Transition to p.o. antiepileptics once p.o. route is established Follow-up with neurology in 3 months  Aspiration pneumonia/Acute respiratory failure with hypoxia Patient developed fever overnight.  Decreasing oxygen saturations were noted.  Placed on 2 L of oxygen by nasal cannula.  Chest x-ray suggests pneumonia.  Looks like her NG tube may have come out partially.  She likely aspirated. Blood cultures will be ordered. Will initiate Unasyn.  Monitor fever trends.  Check CBC tomorrow. She was diagnosed with influenza at admission but unlikely that influenza is responsible for current picture.  Oropharyngeal dysphagia Seen by speech therapy.  Not cleared for oral intake due to high aspiration risk.   NG tube initially placed on the 24th, replaced with coretrak 27th after previous tube dislodged/pulled.  Influenza A  bronchitis, resolving -Diagnosed on 04/29/2022 -Continue droplet precautions  -No indication for Tamiflu at this time  Essential hypertension Started back on  diltiazem via tube.  Blood pressures are reasonably well-controlled  Leukocytosis Likely reactive, downtrending appropriately  Hypophosphatemia and hypomagnesemia Supplemented.  Hypothyroidism Continue levothyroxine  Hypokalemia Improved.  Supplement today.  History of laryngeal cancer Cancer free for many years, discharged from oncology follow-up in 2020     DVT prophylaxis: Lovenox Code Status: Full Family Communication: None present Disposition Plan: Eventually she will need to go to skilled nursing facility.  Will need to sort out feeding issues prior to discharge.  Status is: Inpatient Remains inpatient appropriate because: Status epilepticus  Consultants: Neurology Procedures: LTM EEG Antimicrobials: None  Subjective: No acute issues or events overnight appears to be more awake alert this morning, NG tube being placed without difficulty  Objective: Vitals:   05/07/22 1746 05/07/22 1950 05/07/22 2300 05/08/22 0459  BP: 114/78 110/74 116/85 (!) 139/99  Pulse: (!) 54 97  97  Resp:  '16 18 18  '$ Temp: 98.1 F (36.7 C) 98.2 F (36.8 C) 98.1 F (36.7 C) 98 F (36.7 C)  TempSrc: Axillary Axillary Axillary Oral  SpO2: 99% 98% 97% 99%  Weight:      Height:       No intake or output data in the 24 hours ending 05/08/22 0758  Filed Weights   05/05/22 0547 05/06/22 0455 05/07/22 0452  Weight: 40.3 kg 41.2 kg 41.6 kg    Examination:  General appearance: Awake alert.  In no distress.  Oriented to person Resp: Mildly tachypneic.  No use of accessory muscles.  Coarse breath sounds bilaterally.  Crackles at the bases. Cardio: S1-S2 is normal regular.  No S3-S4.  No rubs murmurs or bruit GI: Abdomen is soft.  Nontender nondistended.  Bowel sounds are  present normal.  No masses organomegaly Extremities: No edema.    Data Reviewed: I have personally reviewed following labs and imaging studies  CBC: Recent Labs  Lab 05/03/22 0359 05/04/22 0252 05/05/22 0313  05/06/22 0711 05/08/22 0622  WBC 16.5* 12.5* 7.2 8.6 11.5*  NEUTROABS  --  11.3*  --   --   --   HGB 15.0 14.8 14.3 13.4 12.5  HCT 44.6 44.6 43.7 39.6 36.9  MCV 95.9 97.6 97.8 98.3 97.1  PLT 314 305 280 216 161    Basic Metabolic Panel: Recent Labs  Lab 05/03/22 0359 05/05/22 0313 05/06/22 0711 05/06/22 1548 05/07/22 0448 05/07/22 1649 05/08/22 0622  NA 142 141 144  --  142  --  139  K 3.9 3.6 4.2  --  3.6  --  4.2  CL 110 115* 118*  --  110  --  107  CO2 22 22 19*  --  24  --  22  GLUCOSE 124* 92 85  --  111*  --  104*  BUN 8 <5* <5*  --  5*  --  9  CREATININE 0.91 0.69 0.83  --  0.93  --  0.86  CALCIUM 8.5* 7.9* 7.6*  --  8.6*  --  8.5*  MG  --  1.3* 2.0 1.9 2.0 1.8 1.8  PHOS  --  <1.0* 3.8 2.8 2.9 3.6 3.6    GFR: Estimated Creatinine Clearance: 52.5 mL/min (by C-G formula based on SCr of 0.86 mg/dL). Liver Function Tests: Recent Labs  Lab 05/01/22 1337 05/02/22 0728 05/03/22 0359 05/05/22 0313  AST '29 23 26 26  '$ ALT '14 13 13 14  '$ ALKPHOS 95 87 93 82  BILITOT 1.0 1.2 0.9 0.9  PROT 5.5* 5.7* 6.1* 5.8*  ALBUMIN 2.7* 2.5* 2.8* 2.6*     CBG: Recent Labs  Lab 05/07/22 0820 05/07/22 1305 05/07/22 1743 05/07/22 2021 05/07/22 2308  GLUCAP 141* 125* 107* 110* 104*      Recent Results (from the past 240 hour(s))  Resp panel by RT-PCR (RSV, Flu A&B, Covid) Anterior Nasal Swab     Status: Abnormal   Collection Time: 04/29/22  8:02 PM   Specimen: Anterior Nasal Swab  Result Value Ref Range Status   SARS Coronavirus 2 by RT PCR NEGATIVE NEGATIVE Final    Comment: (NOTE) SARS-CoV-2 target nucleic acids are NOT DETECTED.  The SARS-CoV-2 RNA is generally detectable in upper respiratory specimens during the acute phase of infection. The lowest concentration of SARS-CoV-2 viral copies this assay can detect is 138 copies/mL. A negative result does not preclude SARS-Cov-2 infection and should not be used as the sole basis for treatment or other patient  management decisions. A negative result may occur with  improper specimen collection/handling, submission of specimen other than nasopharyngeal swab, presence of viral mutation(s) within the areas targeted by this assay, and inadequate number of viral copies(<138 copies/mL). A negative result must be combined with clinical observations, patient history, and epidemiological information. The expected result is Negative.  Fact Sheet for Patients:  EntrepreneurPulse.com.au  Fact Sheet for Healthcare Providers:  IncredibleEmployment.be  This test is no t yet approved or cleared by the [place] FDA and  has been authorized for detection and/or diagnosis of SARS-CoV-2 by FDA under an Emergency Use Authorization (EUA). This EUA will remain  in effect (meaning this test can be used) for the duration of the COVID-19 declaration under Section 564(b)(1) of the Act, 21 U.S.C.section 360bbb-3(b)(1), unless the authorization is  terminated  or revoked sooner.       Influenza A by PCR POSITIVE (A) NEGATIVE Final   Influenza B by PCR NEGATIVE NEGATIVE Final    Comment: (NOTE) The Xpert Xpress SARS-CoV-2/FLU/RSV plus assay is intended as an aid in the diagnosis of influenza from Nasopharyngeal swab specimens and should not be used as a sole basis for treatment. Nasal washings and aspirates are unacceptable for Xpert Xpress SARS-CoV-2/FLU/RSV testing.  Fact Sheet for Patients: EntrepreneurPulse.com.au  Fact Sheet for Healthcare Providers: IncredibleEmployment.be  This test is not yet approved or cleared by the [place] FDA and has been authorized for detection and/or diagnosis of SARS-CoV-2 by FDA under an Emergency Use Authorization (EUA). This EUA will remain in effect (meaning this test can be used) for the duration of the COVID-19 declaration under Section 564(b)(1) of the Act, 21 U.S.C. section  360bbb-3(b)(1), unless the authorization is terminated or revoked.     Resp Syncytial Virus by PCR NEGATIVE NEGATIVE Final    Comment: (NOTE) Fact Sheet for Patients: EntrepreneurPulse.com.au  Fact Sheet for Healthcare Providers: IncredibleEmployment.be  This test is not yet approved or cleared by the [place] FDA and has been authorized for detection and/or diagnosis of SARS-CoV-2 by FDA under an Emergency Use Authorization (EUA). This EUA will remain in effect (meaning this test can be used) for the duration of the COVID-19 declaration under Section 564(b)(1) of the Act, 21 U.S.C. section 360bbb-3(b)(1), unless the authorization is terminated or revoked.  Performed at [facility], [place] [address], [place], [place] [address]          Radiology Studies: DG CHEST PORT 1 VIEW  Result Date: 05/06/2022 CLINICAL DATA:  [id number].  NGT placement. EXAM: PORTABLE CHEST 1 VIEW COMPARISON:  Portable chest 05/01/2022 FINDINGS: 10:04 p.m. NGT has been placed and terminates at the body of the stomach. The cardiomediastinal silhouette and vascular pattern are normal. The upper lung apices are clipped from the exposure. There is increased left infrahilar opacity today which could be due to atelectasis, pneumonia or aspiration. Remaining lungs are clear apart from scattered calcified granulomas. No pleural effusion is seen or measurable pneumothorax. Reverse S shaped thoracic scoliosis is again shown. IMPRESSION: 1. NGT terminates at the body of the stomach. 2. Increased left infrahilar opacity could be due to atelectasis, pneumonia or aspiration. Attention on follow-up films recommended. Electronically Signed   By: Telford Nab M.D.   On: 05/06/2022 22:21    Scheduled Meds:  diltiazem  30 mg Per Tube Q8H   enoxaparin (LOVENOX) injection  20 mg Subcutaneous Q24H   free water  100 mL Per Tube Q6H   levothyroxine  75 mcg Per Tube QAC  breakfast   LORazepam  0.5 mg Intravenous Once   PHENObarbital  65 mg Intravenous BID   Followed by   Derrill Memo ON 05/11/2022] PHENObarbital  65 mg Intravenous Daily   Followed by   Derrill Memo ON 05/14/2022] PHENObarbital  32.5 mg Intravenous Daily   sodium chloride flush  10-40 mL Intracatheter Q12H   sodium chloride flush  3 mL Intravenous Q12H   Continuous Infusions:  ampicillin-sulbactam (UNASYN) IV 3 g (05/08/22 0204)   feeding supplement (OSMOLITE 1.2 CAL) 1,000 mL (05/07/22 1759)   lacosamide (VIMPAT) IV 100 mg (05/07/22 2255)   levETIRAcetam 1,000 mg (05/07/22 2249)    Little Ishikawa, DO [facility] 05/08/2022, 7:58 AM

## 2022-05-08 NOTE — TOC Progression Note (Signed)
Transition of Care [facility]) - Progression Note    Patient Details  Name: Crystal Spence MRN: [id number] Date of Birth: November 25, 1975  Transition of Care [facility]) CM/SW Contact  Jinger Neighbors, [place] Phone Number: 05/08/2022, 8:54 AM  Clinical Narrative:     PASSR: [id number]  Expected Discharge Plan: [place] Barriers to Discharge: Continued Medical Work up  Expected Discharge Plan and Services       Living arrangements for the past 2 months: Hotel/Motel (Janett Billow reports pt has lived at a hotel for the last 3 years)                                       Social Determinants of Health ([place]) Interventions [place]: No Food Insecurity (05/01/2022)  Housing: Low Risk  (05/01/2022)  Transportation Needs: No Transportation Needs (05/01/2022)  Utilities: Not At Risk (05/01/2022)  Financial Resource Strain: Medium Risk (01/17/2018)  Tobacco Use: Medium Risk (05/01/2022)    Readmission Risk Interventions     No data to display

## 2022-05-09 DIAGNOSIS — G40901 Epilepsy, unspecified, not intractable, with status epilepticus: Secondary | ICD-10-CM | POA: Diagnosis not present

## 2022-05-09 LAB — GLUCOSE, CAPILLARY
Glucose-Capillary: 104 mg/dL — ABNORMAL HIGH (ref 70–99)
Glucose-Capillary: 106 mg/dL — ABNORMAL HIGH (ref 70–99)
Glucose-Capillary: 108 mg/dL — ABNORMAL HIGH (ref 70–99)
Glucose-Capillary: 108 mg/dL — ABNORMAL HIGH (ref 70–99)
Glucose-Capillary: 86 mg/dL (ref 70–99)

## 2022-05-09 NOTE — TOC Progression Note (Addendum)
Transition of Care [facility]) - Progression Note    Patient Details  Name: Crystal Spence MRN: [id number] Date of Birth: [birth year]  [facility]) CM/SW Contact  Jinger Neighbors, [place] Phone Number: 05/09/2022, 11:00 AM  Clinical Narrative:     CSW reviewed bed offers, printed and reviewed with patient at bedside. CSW called patient's daughter, Janett Billow, to further review and discuss. CSW will f/u with Janett Billow later in the day. CSW met with pt and her dtr at bedside to discuss SNF. Janett Billow reported she did not look and will f/u. CSW received a call from [place] stating they are choosing [facility] in [place]. CSW attempted to contact [place], admission coordinator and then sent a message, attempt unsuccessful.    Expected Discharge Plan: [place] Barriers to Discharge: Continued Medical Work up  Expected Discharge Plan and [place] arrangements for the past 2 months: Hotel/Motel (Janett Billow reports pt has lived at a hotel for the last 3 years)                                       Social Determinants of Health ([place]) Interventions [place]: No Food Insecurity (05/01/2022)  Housing: Low Risk  (05/01/2022)  Transportation Needs: No Transportation Needs (05/01/2022)  Utilities: Not At Risk (05/01/2022)  Financial Resource Strain: Medium Risk (01/17/2018)  Tobacco Use: Medium Risk (05/01/2022)    Readmission Risk Interventions     No data to display

## 2022-05-09 NOTE — Progress Notes (Signed)
PROGRESS NOTE    Crystal Spence  ALP:[id number] DOB: 1975/05/21 DOA: 04/30/2022 PCP: Trey Sailors, PA   Brief Narrative:  46 y.o. 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  CT of the head was negative for acute abnormality.  She was treated with IV Ativan and Keppra.  Neurology was consulted and patient was transferred to [facility] and started on EEG.  Assessment & Plan:   Status epilepticus, resolving Patient was seen by neurology.  Currently on Vimpat, phenobarbital and Keppra. Continue to wean phenobarbital per neurology recommendations Patient off of LTM.  MRI brain without any acute findings or lesions. Mentation appears to be gradually improving. Transition to p.o. antiepileptics once p.o. route is safely established, see below Follow-up with neurology in 3 months  Aspiration pneumonia/Acute respiratory failure with hypoxia Patient developed fever overnight.  Decreasing oxygen saturations were noted.  Placed on 2 L of oxygen by nasal cannula.  Chest x-ray suggests pneumonia.  Looks like her NG tube may have come out partially.  She likely aspirated. Blood cultures will be ordered. Will initiate Unasyn.  Monitor fever trends.  Check CBC tomorrow. She was diagnosed with influenza at admission but unlikely that influenza is responsible for current picture.  Oropharyngeal dysphagia Speech therapy following, continue to advance diet per recommendations, tolerating ice chips today without overt symptoms NG tube feeds ongoing  Influenza A  bronchitis, resolving -Diagnosed on 04/29/2022 -Discontinue precautions -No indication for Tamiflu at this time  Essential hypertension Started back on diltiazem via tube.   Blood pressures are reasonably well-controlled  Leukocytosis Likely reactive, downtrending appropriately  Hypophosphatemia and hypomagnesemia Supplemented.  Hypothyroidism Continue levothyroxine  Hypokalemia Continue to follow  History of laryngeal cancer, resolved Cancer free for years, discharged from oncology follow-up in 2020    DVT prophylaxis: Lovenox Code Status: Full Family Communication: None present Disposition Plan: Eventually she will need to go to skilled nursing facility.  Will need to sort out feeding issues prior to discharge.  Status is: Inpatient Remains inpatient appropriate because: Status epilepticus  Consultants: Neurology Procedures: LTM EEG Antimicrobials: None  Subjective: No acute issues or events overnight appears to be more awake alert this morning, NG tube being placed without difficulty  Objective: Vitals:   05/08/22 2000 05/08/22 2346 05/09/22 0324 05/09/22 0500  BP: 109/83 112/84 (!) 138/95   Pulse: 95 89 97   Resp: '14 15 15   '$ Temp: 98 F (36.7 C) 97.8 F (36.6 C) 98 F (36.7 C)   TempSrc: Axillary Oral Oral   SpO2: 93% 98% 98%   Weight:    41.1 kg  Height:        Intake/Output Summary (Last 24 hours) at 05/09/2022 0750 Last data filed at 05/08/2022 2300 Gross per 24 hour  Intake 1935.5 ml  Output 200 ml  Net 1735.5 ml    Filed Weights   05/06/22 0455 05/07/22 0452 05/09/22 0500  Weight: 41.2 kg 41.6 kg 41.1 kg    Examination:  General appearance: Awake alert.  In no distress.  Oriented to person Resp: Mildly tachypneic.  No use of accessory muscles.  Coarse breath sounds bilaterally.  Crackles at the bases. Cardio: S1-S2 is normal regular.  No S3-S4.  No rubs murmurs or bruit GI: Abdomen is soft.  Nontender nondistended.  Bowel sounds are present normal.  No masses organomegaly Extremities: No edema.    Data Reviewed: I have personally reviewed following labs and imaging studies  CBC: Recent Labs  Lab  05/03/22 0359 05/04/22 0252 05/05/22 0313 05/06/22 0711 05/08/22 0622  WBC 16.5* 12.5* 7.2 8.6 11.5*  NEUTROABS  --  11.3*  --   --   --   HGB 15.0 14.8 14.3 13.4 12.5  HCT 44.6 44.6 43.7 39.6 36.9  MCV 95.9 97.6 97.8 98.3 97.1  PLT 314 305 280 216 185    Basic Metabolic Panel: Recent Labs  Lab 05/03/22 0359 05/05/22 0313 05/05/22 0313 05/06/22 0711 05/06/22 1548 05/07/22 0448 05/07/22 1649 05/08/22 0622 05/08/22 1817  NA 142 141  --  144  --  142  --  139  --   K 3.9 3.6  --  4.2  --  3.6  --  4.2  --   CL 110 115*  --  118*  --  110  --  107  --   CO2 22 22  --  19*  --  24  --  22  --   GLUCOSE 124* 92  --  85  --  111*  --  104*  --   BUN 8 <5*  --  <5*  --  5*  --  9  --   CREATININE 0.91 0.69  --  0.83  --  0.93  --  0.86  --   CALCIUM 8.5* 7.9*  --  7.6*  --  8.6*  --  8.5*  --   MG  --  1.3*   < > 2.0 1.9 2.0 1.8 1.8 1.7  PHOS  --  <1.0*   < > 3.8 2.8 2.9 3.6 3.6 3.7   < > = values in this interval not displayed.    GFR: Estimated Creatinine Clearance: 51.9 mL/min (by C-G formula based on SCr of 0.86 mg/dL). Liver Function Tests: Recent Labs  Lab 05/03/22 0359 05/05/22 0313  AST 26 26  ALT 13 14  ALKPHOS 93 82  BILITOT 0.9 0.9  PROT 6.1* 5.8*  ALBUMIN 2.8* 2.6*     CBG: Recent Labs  Lab 05/08/22 1325 05/08/22 1550 05/08/22 2044 05/08/22 2343 05/09/22 0339  GLUCAP 105* 123* 123* 96 106*      Recent Results (from the past 240 hour(s))  Resp panel by RT-PCR (RSV, Flu A&B, Covid) Anterior Nasal Swab     Status: Abnormal   Collection Time: 04/29/22  8:02 PM   Specimen: Anterior Nasal Swab  Result Value Ref Range Status   SARS Coronavirus 2 by RT PCR NEGATIVE NEGATIVE Final    Comment: (NOTE) SARS-CoV-2 target nucleic acids are NOT DETECTED.  The SARS-CoV-2 RNA is generally detectable in upper respiratory specimens during the acute phase of infection. The lowest concentration of SARS-CoV-2 viral copies this assay can detect is 138  copies/mL. A negative result does not preclude SARS-Cov-2 infection and should not be used as the sole basis for treatment or other patient management decisions. A negative result may occur with  improper specimen collection/handling, submission of specimen other than nasopharyngeal swab, presence of viral mutation(s) within the areas targeted by this assay, and inadequate number of viral copies(<138 copies/mL). A negative result must be combined with clinical observations, patient history, and epidemiological information. The expected result is Negative.  Fact Sheet for Patients:  EntrepreneurPulse.com.au  Fact Sheet for Healthcare Providers:  IncredibleEmployment.be  This test is  no t yet approved or cleared by the [place] and  has been authorized for detection and/or diagnosis of SARS-CoV-2 by FDA under an Emergency Use Authorization (EUA). This EUA will remain  in effect (meaning this test can be used) for the duration of the COVID-19 declaration under Section 564(b)(1) of the Act, 21 U.S.C.section 360bbb-3(b)(1), unless the authorization is terminated  or revoked sooner.       Influenza A by PCR POSITIVE (A) NEGATIVE Final   Influenza B by PCR NEGATIVE NEGATIVE Final    Comment: (NOTE) The Xpert Xpress SARS-CoV-2/FLU/RSV plus assay is intended as an aid in the diagnosis of influenza from Nasopharyngeal swab specimens and should not be used as a sole basis for treatment. Nasal washings and aspirates are unacceptable for Xpert Xpress SARS-CoV-2/FLU/RSV testing.  Fact Sheet for Patients: EntrepreneurPulse.com.au  Fact Sheet for Healthcare Providers: IncredibleEmployment.be  This test is not yet approved or cleared by the [place] FDA and has been authorized for detection and/or diagnosis of SARS-CoV-2 by FDA under an Emergency Use Authorization (EUA). This EUA will remain in effect  (meaning this test can be used) for the duration of the COVID-19 declaration under Section 564(b)(1) of the Act, 21 U.S.C. section 360bbb-3(b)(1), unless the authorization is terminated or revoked.     Resp Syncytial Virus by PCR NEGATIVE NEGATIVE Final    Comment: (NOTE) Fact Sheet for Patients: EntrepreneurPulse.com.au  Fact Sheet for Healthcare Providers: IncredibleEmployment.be  This test is not yet approved or cleared by the [place] FDA and has been authorized for detection and/or diagnosis of SARS-CoV-2 by FDA under an Emergency Use Authorization (EUA). This EUA will remain in effect (meaning this test can be used) for the duration of the COVID-19 declaration under Section 564(b)(1) of the Act, 21 U.S.C. section 360bbb-3(b)(1), unless the authorization is terminated or revoked.  Performed at [facility], [place] [address] [place], [place] [address]   Culture, blood (Routine X 2) w Reflex to ID Panel     Status: None (Preliminary result)   Collection Time: 05/07/22  9:17 AM   Specimen: BLOOD  Result Value Ref Range Status   Specimen Description BLOOD LEFT ANTECUBITAL  Final   Special Requests   Final    BOTTLES DRAWN AEROBIC AND ANAEROBIC Blood Culture adequate volume   Culture   Final    NO GROWTH 2 DAYS Performed at [facility], [place] [address], [place], [place] [address]    Report Status PENDING  Incomplete  Culture, blood (Routine X 2) w Reflex to ID Panel     Status: None (Preliminary result)   Collection Time: 05/07/22  9:17 AM   Specimen: BLOOD LEFT HAND  Result Value Ref Range Status   Specimen Description BLOOD LEFT HAND  Final   Special Requests   Final    BOTTLES DRAWN AEROBIC AND ANAEROBIC Blood Culture results may not be optimal due to an inadequate volume of blood received in culture bottles   Culture   Final    NO GROWTH 2 DAYS Performed at [facility], [place] [address]  [place], [place] [address]    Report Status PENDING  Incomplete         Radiology Studies: DG Abd Portable 1V  Result Date: 05/08/2022 CLINICAL DATA:  Feeding tube placement. EXAM: PORTABLE ABDOMEN - 1 VIEW COMPARISON:  05/05/2022. FINDINGS: Feeding tube is followed into the stomach with the tip projecting beyond the inferior margin of the image. Bowel  gas pattern is grossly unremarkable. Numerous calcified granulomas in the lungs. Lung bases likely atelectasis. IMPRESSION: Feeding tube is followed into the stomach with the tip projecting beyond the inferior margin of the image. Electronically Signed   By: Lorin Picket M.D.   On: 05/08/2022 10:19    Scheduled Meds:  diltiazem  30 mg Per Tube Q8H   enoxaparin (LOVENOX) injection  20 mg Subcutaneous Q24H   free water  100 mL Per Tube Q6H   levothyroxine  75 mcg Per Tube QAC breakfast   LORazepam  0.5 mg Intravenous Once   PHENObarbital  65 mg Intravenous BID   Followed by   Derrill Memo ON 05/11/2022] PHENObarbital  65 mg Intravenous Daily   Followed by   Derrill Memo ON 05/14/2022] PHENObarbital  32.5 mg Intravenous Daily   polyethylene glycol  17 g Oral BID   sodium chloride flush  10-40 mL Intracatheter Q12H   sodium chloride flush  3 mL Intravenous Q12H   Continuous Infusions:  ampicillin-sulbactam (UNASYN) IV Stopped (05/09/22 0300)   feeding supplement (OSMOLITE 1.2 CAL) 55 mL/hr at 05/08/22 2300   lacosamide (VIMPAT) IV 100 mg (05/08/22 2255)   levETIRAcetam 1,000 mg (05/08/22 2229)    Little Ishikawa, DO [facility] 05/09/2022, 7:50 AM

## 2022-05-09 NOTE — Progress Notes (Signed)
Mobility Specialist: Progress Note   05/09/22 1226  Mobility  Activity Ambulated with assistance in hallway  Level of Assistance Minimal assist, patient does 75% or more  Assistive Device Other (Comment) (HHA)  Distance Ambulated (ft) 90 ft  Activity Response Tolerated well  Mobility Referral Yes  $Mobility charge 1 Mobility   Pt received in the bed and agreeable to mobility. MinA for bed mobility as well as to stand. Ambulated with HHA. Pt unsteady requiring minA for balance. Recommend using RW next session. No c/o throughout. Pt back to bed after session with call bell at her side. Bed alarm is on.   [place] Atiyana Welte Mobility Specialist Please contact via SecureChat or Rehab office at [phone]

## 2022-05-10 DIAGNOSIS — G40901 Epilepsy, unspecified, not intractable, with status epilepticus: Secondary | ICD-10-CM | POA: Diagnosis not present

## 2022-05-10 LAB — GLUCOSE, CAPILLARY
Glucose-Capillary: 100 mg/dL — ABNORMAL HIGH (ref 70–99)
Glucose-Capillary: 101 mg/dL — ABNORMAL HIGH (ref 70–99)
Glucose-Capillary: 104 mg/dL — ABNORMAL HIGH (ref 70–99)
Glucose-Capillary: 77 mg/dL (ref 70–99)
Glucose-Capillary: 93 mg/dL (ref 70–99)
Glucose-Capillary: 97 mg/dL (ref 70–99)

## 2022-05-10 LAB — CBC
HCT: 33.8 % — ABNORMAL LOW (ref 36.0–46.0)
Hemoglobin: 11.5 g/dL — ABNORMAL LOW (ref 12.0–15.0)
MCH: 32.6 pg (ref 26.0–34.0)
MCHC: 34 g/dL (ref 30.0–36.0)
MCV: 95.8 fL (ref 80.0–100.0)
Platelets: 284 10*3/uL (ref 150–400)
RBC: 3.53 MIL/uL — ABNORMAL LOW (ref 3.87–5.11)
RDW: 17.5 % — ABNORMAL HIGH (ref 11.5–15.5)
WBC: 4.5 10*3/uL (ref 4.0–10.5)
nRBC: 0 % (ref 0.0–0.2)

## 2022-05-10 LAB — BASIC METABOLIC PANEL
Anion gap: 5 (ref 5–15)
BUN: 8 mg/dL (ref 6–20)
CO2: 27 mmol/L (ref 22–32)
Calcium: 8.4 mg/dL — ABNORMAL LOW (ref 8.9–10.3)
Chloride: 106 mmol/L (ref 98–111)
Creatinine, Ser: 0.86 mg/dL (ref 0.44–1.00)
GFR, Estimated: >60 mL/min (ref >60)
Glucose, Bld: 104 mg/dL — ABNORMAL HIGH (ref 70–99)
Potassium: 3.8 mmol/L (ref 3.5–5.1)
Sodium: 138 mmol/L (ref 135–145)

## 2022-05-10 NOTE — Procedures (Signed)
Objective Swallowing Evaluation: Type of Study: FEES-Fiberoptic Endoscopic Evaluation of Swallow   Patient Details  Name: Crystal Spence MRN: [id number] Date of Birth: 10/06/1975  Today's Date: 05/10/2022 Time: SLP Start Time (ACUTE ONLY): 1450 -SLP Stop Time (ACUTE ONLY): 2683  SLP Time Calculation (min) (ACUTE ONLY): 57 min   Past Medical History:  Past Medical History:  Diagnosis Date   Anemia, unspecified 06/17/2013   Headache    History of laryngeal cancer 02/03/2013   Hypertension    Hypothyroidism (acquired) 06/17/2013   Multiple lung nodules 06/28/2014   Pneumonia 01/14/2014, 04/2017   Poor oral hygiene 06/17/2013   Rib pain 01/14/2014   Rib pain on left side 01/05/2014   Throat cancer [facility])    Past Surgical History:  Past Surgical History:  Procedure Laterality Date   ABDOMINAL SURGERY     BIOPSY  01/18/2018   Procedure: BIOPSY;  Surgeon: Wonda Horner, MD;  Location: [facility];  Service: Endoscopy;;   COLONOSCOPY WITH PROPOFOL N/A 10/12/2015   Procedure: COLONOSCOPY WITH PROPOFOL;  Surgeon: Wonda Horner, MD;  Location: [facility] ENDOSCOPY;  Service: Endoscopy;  Laterality: N/A;   ESOPHAGOGASTRODUODENOSCOPY N/A 02/03/2013   Procedure: ESOPHAGOGASTRODUODENOSCOPY (EGD);  Surgeon: Juanita Craver, MD;  Location: [facility] ENDOSCOPY;  Service: Endoscopy;  Laterality: N/A;   ESOPHAGOGASTRODUODENOSCOPY N/A 10/12/2015   Procedure: ESOPHAGOGASTRODUODENOSCOPY (EGD);  Surgeon: Wonda Horner, MD;  Location: [facility];  Service: Endoscopy;  Laterality: N/A;   ESOPHAGOGASTRODUODENOSCOPY (EGD) WITH PROPOFOL N/A 01/18/2018   Procedure: ESOPHAGOGASTRODUODENOSCOPY (EGD) WITH PROPOFOL;  Surgeon: Wonda Horner, MD;  Location: [facility] ENDOSCOPY;  Service: Endoscopy;  Laterality: N/A;   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 CT of the head was negative for acute abnormality. She was treated with IV Ativan and Keppra. Neurology was consulted and patient was transferred to [facility] and started on EEG.". Swallow eval ordered. Pt had a trach and PEG in 2015 with cancer diagnosis, tx. She also has a paralyzed vocal cord - [name] note from [place] 2019- "Nasopharynx clear. Oropharynx, hypopharynx and larynx with diffuse radiation changes. Left cord does not move. Right side move well. Both cords look healthy. No masses identified. Diffuse radiation changes distort the entire laryngeal anatomy." [name] scoping pt; BSE completed with pt remaining NPO d/t mentation primarily. ST f/u for PO readiness.   Subjective: alert and participatory    Recommendations for follow up therapy are one component of a multi-disciplinary discharge planning process, led by the attending physician.  Recommendations may be updated based on patient status, additional functional criteria and insurance authorization.  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  Anatomy is c/b by distortion of the laryngeal structures. There was right>left mobility of the vocal folds; significant changes to the epiglottis, pharyngeal walls, entirety of the larynx.  There were standing secretions that continually pooled in laryngeal vestibule.  All PO trials - nectar, honey-thick, thin liquids, and purees- were aspirated to some degree either before onset of the  swallow or after the swallow.  There was mild residue throughout the hypopharynx, a portion of which would enter the larynx along with secretions and pool above the vocal folds. Some degree of this material would be aspirated, eliciting a mild cough response.  Postural adjustments did not improve airway protection. Ms. Edison Nasuti' dysphagia is chronic- she has likely been dealing with varying degrees of aspiration over the years with some ability to tolerate it without adverse consequences.  Discussed with Ms. Lewey and her daughter, Janett Billow, via phone: 1) radiation as the cause of her dysphagia and aspiration; 2) chronic nature of aspiration and the fluctuations in her ability to tolerate it; 3) inability to eat enough to sustain her needs and the benefit of considering PEG in her situation; 4) continuing to allow some PO intake for pleasure. Both Jessica and the pt agreed that pursuing PEG may be the best course to take. Ms Amble verbalized her desire to resume eating/drinking again - getting the NG D/Cd was her primary focus today. Discussed results with Dr. Avon Gully. Recommend continuing to allow sips of water and ice chips for now. SLP will follow pending plan for nutrition.   SLP Visit Diagnosis Dysphagia, pharyngeal phase (R13.13)  Impact on safety and function Moderate aspiration risk         05/10/2022    3:00 PM  Treatment Recommendations  Treatment Recommendations Therapy as outlined in treatment plan below        05/10/2022    3:00 PM  Prognosis  Prognosis for Safe Diet Advancement Guarded  Barriers to Reach Goals Severity of deficits;Time post onset       05/10/2022    3:00 PM  Diet Recommendations  SLP Diet Recommendations Ice chips PRN after oral care;Free water protocol after oral care  Liquid Administration via Cup  Compensations Slow rate;Small sips/bites         05/10/2022    3:00 PM  Other Recommendations  Oral Care Recommendations Oral care QID       05/10/2022     3:00 PM  Frequency and Duration   Speech Therapy Frequency (ACUTE ONLY) min 2x/week  Treatment Duration 1 week         05/10/2022    3:00 PM  Oral Phase  Oral Phase [facility]       05/10/2022    3:00 PM  Pharyngeal Phase  Pharyngeal Phase Impaired  Pharyngeal- Nectar Cup Delayed swallow initiation-vallecula;Reduced epiglottic inversion;Reduced anterior laryngeal mobility;Reduced laryngeal elevation;Reduced airway/laryngeal closure;Penetration/Aspiration before swallow;Penetration/Apiration after swallow;Trace aspiration;Pharyngeal residue - valleculae;Pharyngeal residue - pyriform;Inter-arytenoid space residue  Pharyngeal Material enters airway, CONTACTS cords and not ejected out;Material enters airway, passes BELOW cords and not ejected out despite cough attempt by patient  Pharyngeal- Nectar Straw Delayed swallow initiation-vallecula;Reduced epiglottic inversion;Reduced anterior laryngeal mobility;Reduced laryngeal elevation;Reduced airway/laryngeal closure;Penetration/Aspiration before swallow;Penetration/Apiration after swallow;Trace aspiration;Pharyngeal residue - valleculae;Pharyngeal residue - pyriform;Inter-arytenoid space residue  Pharyngeal- Thin Cup Reduced epiglottic inversion;Reduced anterior laryngeal mobility;Reduced laryngeal elevation;Reduced airway/laryngeal closure;Penetration/Aspiration before swallow;Penetration/Apiration after swallow;Trace aspiration;Pharyngeal residue - valleculae;Delayed swallow initiation-pyriform sinuses;Pharyngeal residue - pyriform;Pharyngeal residue - posterior pharnyx  Pharyngeal Material enters airway, passes BELOW cords and not ejected out despite cough attempt by patient;Material enters airway, CONTACTS cords and not ejected out  Pharyngeal- Thin Straw Delayed swallow initiation-vallecula;Reduced epiglottic inversion;Reduced anterior laryngeal mobility;Reduced laryngeal elevation;Reduced airway/laryngeal closure;Penetration/Aspiration before  swallow;Penetration/Apiration after swallow;Trace aspiration;Pharyngeal residue - valleculae;Pharyngeal residue - pyriform  Pharyngeal- Puree Delayed swallow initiation-vallecula;Reduced epiglottic inversion;Reduced anterior laryngeal mobility;Reduced laryngeal elevation;Reduced airway/laryngeal closure;Penetration/Aspiration before swallow;Penetration/Apiration after swallow;Trace aspiration;Pharyngeal residue - valleculae;Pharyngeal residue - pyriform;Pharyngeal residue - posterior pharnyx  Pharyngeal Material enters airway, CONTACTS cords and not ejected out         No data to display           Juan Quam Laurice 05/10/2022, 4:31 PM   Marzell Allemand L. Tivis Ringer, MA CCC/SLP Clinical Specialist - [place] Office number [phone]

## 2022-05-10 NOTE — Progress Notes (Signed)
Physical Therapy Treatment Patient Details Name: Crystal Spence MRN: [id number] DOB: 11/02/1975 Today's Date: 05/10/2022   History of Present Illness Pt is a 46 y/o F presenting with seizures. PMH includes hypertension, hypothyroidism, seizure disorder, and history of laryngeal cancer who has been cancer free for many years, tested + for influenza A on 04/30/2019 and started on Tamiflu    PT Comments    Pt greeted semi-reclined in bed and agreeable to session with continued progress towards acute goals with focus on gait with RW for increased activity tolerance and safety with mobility. Pt able to complete all bed mobility at min guard level for safety and come to stand with RW with min guard assist for safety with pt requiring cues for safe hand placement. Pt with noted increase in stability with RW use during gait vs HHA, with pt able to complete >100' with RW and min assist to steady with pt requiring cues throughout for safety and proper use of DME. Speech therapy arriving at end of session. Current plan remains appropriate to address deficits and maximize functional independence and decrease caregiver burden. Pt continues to benefit from skilled PT services to progress toward functional mobility goals.     Recommendations for follow up therapy are one component of a multi-disciplinary discharge planning process, led by the attending physician.  Recommendations may be updated based on patient status, additional functional criteria and insurance authorization.  Follow Up Recommendations  Skilled nursing-short term rehab (<3 hours/day) Can patient physically be transported by private vehicle: No   Assistance Recommended at Discharge Frequent or constant Supervision/Assistance  Patient can return home with the following Two people to help with walking and/or transfers;Two people to help with bathing/dressing/bathroom;Direct supervision/assist for medications management;Assistance with  feeding;Assistance with cooking/housework;Direct supervision/assist for financial management;Assist for transportation;Help with stairs or ramp for entrance   Equipment Recommendations  Other (comment) (TBD)    Recommendations for Other Services       Precautions / Restrictions Precautions Precautions: Fall Precaution Comments: EEG monitoring Restrictions Weight Bearing Restrictions: No     Mobility  Bed Mobility Overal bed mobility: Needs Assistance Bed Mobility: Supine to Sit     Supine to sit: Min guard Sit to supine: Min guard   General bed mobility comments: min guard for safety    Transfers Overall transfer level: Needs assistance Equipment used: Rolling walker (2 wheels) Transfers: Sit to/from Stand Sit to Stand: Min guard           General transfer comment: min guard for safety, cues for hand placement with RW    Ambulation/Gait Ambulation/Gait assistance: Min assist Gait Distance (Feet): 170 Feet Assistive device: Rolling walker (2 wheels) Gait Pattern/deviations: Step-through pattern, Decreased stride length Gait velocity: decr     General Gait Details: improved stability with RW, min assist to steady and cues for RW proximity and centering self in RW as pt with tendency to extend R elbow to shift L   Stairs             Wheelchair Mobility    Modified Rankin (Stroke Patients Only)       Balance Overall balance assessment: Needs assistance Sitting-balance support: Bilateral upper extremity supported, Feet supported Sitting balance-Leahy Scale: Fair Sitting balance - Comments: able to maintain static sitting without assist Postural control: Posterior lean, Left lateral lean Standing balance support: Single extremity supported, During functional activity Standing balance-Leahy Scale: Poor Standing balance comment: requires external support  Cognition Arousal/Alertness: Awake/alert Behavior  During Therapy: Flat affect Overall Cognitive Status: Within Functional Limits for tasks assessed                                 General Comments: able to follow all commands with increased time        Exercises      General Comments General comments (skin integrity, edema, etc.): VSS on RA, handoff to speech at end of session      Pertinent Vitals/Pain Pain Assessment Pain Assessment: Faces Faces Pain Scale: Hurts a little bit Pain Location: generalized Pain Descriptors / Indicators: Sore Pain Intervention(s): Monitored during session, Limited activity within patient's tolerance    Home Living                          Prior Function            PT Goals (current goals can now be found in the care plan section) Acute Rehab PT Goals Patient Stated Goal: to be able to eat PT Goal Formulation: With patient/family Time For Goal Achievement: 05/17/22 Progress towards PT goals: Progressing toward goals    Frequency    Min 2X/week      PT Plan      Co-evaluation              AM-PAC PT "6 Clicks" Mobility   Outcome Measure  Help needed turning from your back to your side while in a flat bed without using bedrails?: A Little Help needed moving from lying on your back to sitting on the side of a flat bed without using bedrails?: A Little Help needed moving to and from a bed to a chair (including a wheelchair)?: A Little Help needed standing up from a chair using your arms (e.g., wheelchair or bedside chair)?: A Little Help needed to walk in hospital room?: A Lot Help needed climbing 3-5 steps with a railing? : Total 6 Click Score: 15    End of Session Equipment Utilized During Treatment: Gait belt Activity Tolerance: Patient tolerated treatment well Patient left: with call bell/phone within reach;in bed;Other (comment) (with speech present) Nurse Communication: Mobility status PT Visit Diagnosis: Unsteadiness on feet (R26.81);Other  abnormalities of gait and mobility (R26.89);Muscle weakness (generalized) (M62.81);History of falling (Z91.81);Pain Pain - Right/Left: Right (shoulder and abdomen) Pain - part of body: Shoulder (and abdomen)     Time: 0762-2633 PT Time Calculation (min) (ACUTE ONLY): 13 min  Charges:  $Gait Training: 8-22 mins                    Leni Pankonin R. PTA Acute Rehabilitation Services Office: [place] 05/10/2022, 2:35 PM

## 2022-05-10 NOTE — Progress Notes (Signed)
PROGRESS NOTE    Crystal Spence  WRU:[id number] DOB: 10-Feb-1976 DOA: 04/30/2022 PCP: Trey Sailors, PA   Brief Narrative:  46 y.o. female with medical history significant for hypertension, hypothyroidism, seizure disorder, and history of laryngeal cancer who has been cancer free for many years, tested positive for influenza A on 04/30/2019 and was started on Tamiflu in the ED presented with seizures to [place].  CT of the head was negative for acute abnormality.  She was treated with IV Ativan and Keppra.  Neurology was consulted and patient was transferred to [facility] and started on EEG.  Assessment & Plan:   Status epilepticus, resolving Patient was seen by neurology.  Currently on Vimpat, phenobarbital and Keppra. Continue to wean phenobarbital per neurology recommendations Patient off of LTM.  MRI brain without any acute findings or lesions. Mentation appears to be gradually improving. Transition to p.o. antiepileptics once p.o. route is safely established, see below Follow-up with neurology in 3 months as scheduled  Aspiration pneumonia/Acute respiratory failure with hypoxia, resolving Transient fever on the 25th, weaning oxygen currently on room air  Chest x-ray concerning for pneumonia, likely aspiration given below (dysphagia ) Continue Unasyn x 5 days Influenza positive at intake, unlikely etiology for symptoms given timing as well as aspiration due to dysphagia is much more likely and consistent with imaging.  Oropharyngeal dysphagia Speech therapy following, continue to advance diet per recommendations, tolerating ice chips today without overt symptoms NG tube feeds ongoing until p.o. route stable established  Influenza A/bronchitis, resolving -Diagnosed on 04/29/2022 -Discontinue precautions -No indication for Tamiflu at this time  Essential hypertension Started back on diltiazem via tube.  Blood pressures are reasonably  well-controlled  Leukocytosis Likely reactive, downtrending appropriately  Hypophosphatemia and hypomagnesemia Supplemented.  Hypothyroidism Continue levothyroxine  Hypokalemia Continue to follow  History of laryngeal cancer, resolved Cancer free for years, discharged from oncology follow-up in 2020    DVT prophylaxis: Lovenox Code Status: Full Family Communication: At bedside Disposition Plan: Eventually she will need to go to skilled nursing facility.  NG tube will have to be discontinued prior to discharge.  Status is: Inpatient Remains inpatient appropriate because: Status epilepticus  Consultants: Neurology Procedures: LTM EEG Antimicrobials: None  Subjective: No acute issues or events overnight, tolerating ice chips at bedside with family.  Requesting advancement of diet which we continue to discuss will depend on speech evaluation given her high risk for aspiration  Objective: Vitals:   05/09/22 2016 05/09/22 2356 05/10/22 0400 05/10/22 0540  BP: (!) 123/92 (!) 111/94 (!) 122/91   Pulse: 88 93 93   Resp: '12 17 15   '$ Temp: 98.1 F (36.7 C) 98.2 F (36.8 C) 98.5 F (36.9 C)   TempSrc: Oral Oral Oral   SpO2: 98% 95% 96%   Weight:    42.5 kg  Height:        Intake/Output Summary (Last 24 hours) at 05/10/2022 0738 Last data filed at 05/09/2022 2000 Gross per 24 hour  Intake 379.83 ml  Output --  Net 379.83 ml    Filed Weights   05/07/22 0452 05/09/22 0500 05/10/22 0540  Weight: 41.6 kg 41.1 kg 42.5 kg    Examination:  General appearance: Awake alert.  In no distress.  Oriented to person Resp: Mildly tachypneic.  No use of accessory muscles.  Coarse breath sounds bilaterally.  Crackles at the bases. Cardio: S1-S2 is normal regular.  No S3-S4.  No rubs murmurs or bruit GI: Abdomen is  soft.  Nontender nondistended.  Bowel sounds are present normal.  No masses organomegaly Extremities: No edema.    Data Reviewed: I have personally reviewed following  labs and imaging studies  CBC: Recent Labs  Lab 05/04/22 0252 05/05/22 0313 05/06/22 0711 05/08/22 0622 05/10/22 0634  WBC 12.5* 7.2 8.6 11.5* 4.5  NEUTROABS 11.3*  --   --   --   --   HGB 14.8 14.3 13.4 12.5 11.5*  HCT 44.6 43.7 39.6 36.9 33.8*  MCV 97.6 97.8 98.3 97.1 95.8  PLT 305 280 216 212 284    Basic Metabolic Panel: Recent Labs  Lab 05/05/22 0313 05/06/22 0711 05/06/22 1548 05/07/22 0448 05/07/22 1649 05/08/22 0622 05/08/22 1817  NA 141 144  --  142  --  139  --   K 3.6 4.2  --  3.6  --  4.2  --   CL 115* 118*  --  110  --  107  --   CO2 22 19*  --  24  --  22  --   GLUCOSE 92 85  --  111*  --  104*  --   BUN <5* <5*  --  5*  --  9  --   CREATININE 0.69 0.83  --  0.93  --  0.86  --   CALCIUM 7.9* 7.6*  --  8.6*  --  8.5*  --   MG 1.3* 2.0 1.9 2.0 1.8 1.8 1.7  PHOS <1.0* 3.8 2.8 2.9 3.6 3.6 3.7    GFR: Estimated Creatinine Clearance: 53.7 mL/min (by C-G formula based on SCr of 0.86 mg/dL). Liver Function Tests: Recent Labs  Lab 05/05/22 0313  AST 26  ALT 14  ALKPHOS 82  BILITOT 0.9  PROT 5.8*  ALBUMIN 2.6*     CBG: Recent Labs  Lab 05/09/22 0813 05/09/22 1208 05/09/22 2017 05/09/22 2346 05/10/22 0414  GLUCAP 108* 104* 86 108* 101*      Recent Results (from the past 240 hour(s))  Culture, blood (Routine X 2) w Reflex to ID Panel     Status: None (Preliminary result)   Collection Time: 05/07/22  9:17 AM   Specimen: BLOOD  Result Value Ref Range Status   Specimen Description BLOOD LEFT ANTECUBITAL  Final   Special Requests   Final    BOTTLES DRAWN AEROBIC AND ANAEROBIC Blood Culture adequate volume   Culture   Final    NO GROWTH 2 DAYS Performed at [facility], [place] [address], [place], [place] [address]    Report Status PENDING  Incomplete  Culture, blood (Routine X 2) w Reflex to ID Panel     Status: None (Preliminary result)   Collection Time: 05/07/22  9:17 AM   Specimen: BLOOD LEFT HAND  Result Value Ref Range Status    Specimen Description BLOOD LEFT HAND  Final   Special Requests   Final    BOTTLES DRAWN AEROBIC AND ANAEROBIC Blood Culture results may not be optimal due to an inadequate volume of blood received in culture bottles   Culture   Final    NO GROWTH 2 DAYS Performed at [facility], [place] [address], [place], [place] [address]    Report Status PENDING  Incomplete         Radiology Studies: DG Abd Portable 1V  Result Date: 05/08/2022 CLINICAL DATA:  Feeding tube placement. EXAM: PORTABLE ABDOMEN - 1 VIEW COMPARISON:  05/05/2022. FINDINGS: Feeding tube is followed into the stomach with  the tip projecting beyond the inferior margin of the image. Bowel gas pattern is grossly unremarkable. Numerous calcified granulomas in the lungs. Lung bases likely atelectasis. IMPRESSION: Feeding tube is followed into the stomach with the tip projecting beyond the inferior margin of the image. Electronically Signed   By: Lorin Picket M.D.   On: 05/08/2022 10:19    Scheduled Meds:  diltiazem  30 mg Per Tube Q8H   enoxaparin (LOVENOX) injection  20 mg Subcutaneous Q24H   free water  100 mL Per Tube Q6H   levothyroxine  75 mcg Per Tube QAC breakfast   LORazepam  0.5 mg Intravenous Once   PHENObarbital  65 mg Intravenous BID   Followed by   Derrill Memo ON 05/11/2022] PHENObarbital  65 mg Intravenous Daily   Followed by   Derrill Memo ON 05/14/2022] PHENObarbital  32.5 mg Intravenous Daily   polyethylene glycol  17 g Oral BID   sodium chloride flush  10-40 mL Intracatheter Q12H   sodium chloride flush  3 mL Intravenous Q12H   Continuous Infusions:  ampicillin-sulbactam (UNASYN) IV 3 g (05/10/22 0206)   feeding supplement (OSMOLITE 1.2 CAL) 55 mL/hr at 05/09/22 2000   lacosamide (VIMPAT) IV 100 mg (05/09/22 2204)   levETIRAcetam 1,000 mg (05/09/22 2120)    Little Ishikawa, DO [facility] 05/10/2022, 7:38 AM

## 2022-05-10 NOTE — Progress Notes (Signed)
Speech Language Pathology Treatment: Dysphagia  Patient Details Name: Crystal Spence MRN: [id number] DOB: 1976-05-08 Today's Date: 05/10/2022 Time: 7948-0165 SLP Time Calculation (min) (ACUTE ONLY): 15 min  Assessment / Plan / Recommendation Clinical Impression  [name] demonstrates clinical improvement in swallowing. Mentation much improved since last seen by SLP. She is no longer demonstrating the dysphagia symptoms that are associated with mental status changes. Today she fed herself sips of thin liquid (continued to elicit intermittent coughing) and sips of honey-thick liquids (no cough).  She is ready for an instrumental swallow study. Will proceed with FEES this afternoon. D/W Dr. Avon Gully.    HPI HPI: Per MD note, "46 y.o. 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 CT of the head was negative for acute abnormality. She was treated with IV Ativan and Keppra. Neurology was consulted and patient was transferred to [facility] and started on EEG.". Swallow eval ordered. Pt had a trach and PEG in 2015 with cancer diagnosis, tx. She also has a paralyzed vocal cord - [name] note from [place] 2019- "Nasopharynx clear. Oropharynx, hypopharynx and larynx with diffuse radiation changes. Left cord does not move. Right side move well. Both cords look healthy. No masses identified. Diffuse radiation changes distort the entire laryngeal anatomy." [name] scoping pt; BSE completed with pt remaining NPO d/t mentation primarily. ST f/u for PO readiness.      SLP Plan  Continue with current plan of care      Recommendations for follow up therapy are one component of a multi-disciplinary discharge planning process, led by the attending  physician.  Recommendations may be updated based on patient status, additional functional criteria and insurance authorization.    Recommendations  Diet recommendations: honey thick liquids -pending FEES today Medication Administration: Via alternative means                Oral Care Recommendations: Oral care QID Assistance recommended at discharge: Frequent or constant Supervision/Assistance SLP Visit Diagnosis: Dysphagia, oropharyngeal phase (R13.12) Plan: Continue with current plan of care         Yona Stansbury L. Tivis Ringer, MA CCC/SLP Clinical Specialist - Acute Care SLP Acute Rehabilitation Services Office number [phone]   Juan Quam Laurice  05/10/2022, 2:35 PM

## 2022-05-11 DIAGNOSIS — G40901 Epilepsy, unspecified, not intractable, with status epilepticus: Secondary | ICD-10-CM | POA: Diagnosis not present

## 2022-05-11 LAB — GLUCOSE, CAPILLARY
Glucose-Capillary: 103 mg/dL — ABNORMAL HIGH (ref 70–99)
Glucose-Capillary: 111 mg/dL — ABNORMAL HIGH (ref 70–99)
Glucose-Capillary: 98 mg/dL (ref 70–99)
Glucose-Capillary: 105 mg/dL — ABNORMAL HIGH (ref 70–99)

## 2022-05-11 NOTE — Progress Notes (Signed)
PROGRESS NOTE    Crystal Spence  DXI:[id number] DOB: [birth year] DOA: 04/30/2022 PCP: Trey Sailors, PA   Brief Narrative:  46 y.o. 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  CT of the head was negative for acute abnormality.  She was treated with IV Ativan and Keppra.  Neurology was consulted and patient was transferred to [facility] and started on EEG.  Assessment & Plan:  Status epilepticus, resolving Patient was seen by neurology.  Currently on Vimpat, phenobarbital and Keppra. Continue to wean phenobarbital per neurology recommendations Patient off of LTM.  MRI brain without any acute findings or lesions. Mentation appears to be gradually improving. Transition to p.o. antiepileptics once p.o. route is safely established, see below Follow-up with neurology in 3 months as scheduled  Aspiration pneumonia/Acute respiratory failure with hypoxia, resolving Transient fever on the 25th, weaning oxygen currently on room air  Chest x-ray concerning for pneumonia, likely aspiration given below Completed Unasyn course Influenza positive at intake, unlikely etiology for symptoms given timing( diagnosed a week prior to admission) as well as aspiration due to dysphagia is much more likely and consistent with imaging.  Oropharyngeal dysphagia, ongoing -Speech therapy following -dysphagia likely secondary to chronic tissue changes in the setting of previous radiation treatment -Unlikely to recover acutely -discussed PEG tube placement with patient, she is agreeable.  She has had ultimate PEG tube placed in the past during laryngeal cancer treatment and is well aware of the indications, risks and benefits. NG tube feeds ongoing  until PEG tube can be placed  Influenza A/bronchitis, resolving -Diagnosed on 04/29/2022 -Discontinue precautions -No indication for Tamiflu at this time  Essential hypertension Continue diltiazem. Blood pressures are reasonably well-controlled  Leukocytosis Likely reactive, downtrending appropriately  Hypophosphatemia and hypomagnesemia Supplemented.  Hypothyroidism Continue levothyroxine  Hypokalemia Continue to follow  History of laryngeal cancer, resolved Cancer free for years, discharged from oncology follow-up in 2020    DVT prophylaxis: Lovenox Code Status: Full Family Communication: At bedside Disposition Plan: Eventually she will need to go to skilled nursing facility.  PEG tube will need to be placed prior to discharge.  Status is: Inpatient Remains inpatient appropriate because: Status epilepticus  Consultants: Neurology Procedures: LTM EEG Antimicrobials: None  Subjective: No acute issues or events overnight, did not do well with swallow evaluation yesterday, agreeable for PEG tube placement -denies nausea vomiting diarrhea constipation headache fevers chills or chest pain  Objective: Vitals:   05/10/22 2008 05/11/22 0000 05/11/22 0330 05/11/22 0809  BP: (!) 129/101 101/78 120/89 119/86  Pulse: 85 88 96 94  Resp: '16 16 16 17  '$ Temp: 97.6 F (36.4 C) 97.7 F (36.5 C) 98 F (36.7 C) 98.9 F (37.2 C)  TempSrc: Oral Oral Oral Oral  SpO2: 93% 98% 94% 96%  Weight:      Height:        Intake/Output Summary (Last 24 hours) at 05/11/2022 0829 Last data filed at 05/11/2022 0618 Gross per 24 hour  Intake 1405 ml  Output 200 ml  Net 1205 ml    Filed Weights   05/07/22 0452 05/09/22 0500 05/10/22 0540  Weight: 41.6 kg 41.1 kg 42.5 kg    Examination:  General appearance: Awake alert.  In no distress.  Oriented x 4 Resp: Mildly  tachypneic.  No use of accessory muscles.  Coarse breath sounds bilaterally.  Crackles at the bases. Cardio: S1-S2 is  normal regular.  No S3-S4.  No rubs murmurs or bruit GI: Abdomen is soft.  Nontender nondistended.  Bowel sounds are present normal.  No masses organomegaly Extremities: No edema.    Data Reviewed: I have personally reviewed following labs and imaging studies  CBC: Recent Labs  Lab 05/05/22 0313 05/06/22 0711 05/08/22 0622 05/10/22 0634  WBC 7.2 8.6 11.5* 4.5  HGB 14.3 13.4 12.5 11.5*  HCT 43.7 39.6 36.9 33.8*  MCV 97.8 98.3 97.1 95.8  PLT 280 216 212 161    Basic Metabolic Panel: Recent Labs  Lab 05/05/22 0313 05/06/22 0711 05/06/22 1548 05/07/22 0448 05/07/22 1649 05/08/22 0622 05/08/22 1817 05/10/22 0634  NA 141 144  --  142  --  139  --  138  K 3.6 4.2  --  3.6  --  4.2  --  3.8  CL 115* 118*  --  110  --  107  --  106  CO2 22 19*  --  24  --  22  --  27  GLUCOSE 92 85  --  111*  --  104*  --  104*  BUN <5* <5*  --  5*  --  9  --  8  CREATININE 0.69 0.83  --  0.93  --  0.86  --  0.86  CALCIUM 7.9* 7.6*  --  8.6*  --  8.5*  --  8.4*  MG 1.3* 2.0 1.9 2.0 1.8 1.8 1.7  --   PHOS <1.0* 3.8 2.8 2.9 3.6 3.6 3.7  --     GFR: Estimated Creatinine Clearance: 53.7 mL/min (by C-G formula based on SCr of 0.86 mg/dL). Liver Function Tests: Recent Labs  Lab 05/05/22 0313  AST 26  ALT 14  ALKPHOS 82  BILITOT 0.9  PROT 5.8*  ALBUMIN 2.6*     CBG: Recent Labs  Lab 05/10/22 1559 05/10/22 2006 05/10/22 2358 05/11/22 0329 05/11/22 0812  GLUCAP 77 100* 93 103* 111*      Recent Results (from the past 240 hour(s))  Culture, blood (Routine X 2) w Reflex to ID Panel     Status: None (Preliminary result)   Collection Time: 05/07/22  9:17 AM   Specimen: BLOOD  Result Value Ref Range Status   Specimen Description BLOOD LEFT ANTECUBITAL  Final   Special Requests   Final    BOTTLES DRAWN AEROBIC AND ANAEROBIC Blood Culture adequate volume   Culture   Final    NO GROWTH 4 DAYS Performed at [facility], [place] [address] [place], [place] [address]    Report  Status PENDING  Incomplete  Culture, blood (Routine X 2) w Reflex to ID Panel     Status: None (Preliminary result)   Collection Time: 05/07/22  9:17 AM   Specimen: BLOOD LEFT HAND  Result Value Ref Range Status   Specimen Description BLOOD LEFT HAND  Final   Special Requests   Final    BOTTLES DRAWN AEROBIC AND ANAEROBIC Blood Culture results may not be optimal due to an inadequate volume of blood received in culture bottles   Culture   Final    NO GROWTH 4 DAYS Performed at [facility], [place] [address] [place], [place] [address]    Report Status PENDING  Incomplete    Radiology Studies: No results found.  Scheduled Meds:  diltiazem  30 mg Per Tube Q8H   enoxaparin (LOVENOX) injection  20 mg Subcutaneous Q24H   free water  100 mL Per Tube Q6H   levothyroxine  75 mcg Per Tube QAC breakfast   LORazepam  0.5 mg Intravenous Once   PHENObarbital  65 mg Intravenous Daily   Followed by   Derrill Memo ON 05/14/2022] PHENObarbital  32.5 mg Intravenous Daily   polyethylene glycol  17 g Oral BID   sodium chloride flush  10-40 mL Intracatheter Q12H   sodium chloride flush  3 mL Intravenous Q12H   Continuous Infusions:  ampicillin-sulbactam (UNASYN) IV 3 g (05/11/22 0815)   feeding supplement (OSMOLITE 1.2 CAL) 1,000 mL (05/11/22 0818)   lacosamide (VIMPAT) IV 100 mg (05/10/22 2244)   levETIRAcetam 1,000 mg (05/10/22 2218)    Little Ishikawa, DO [facility] 05/11/2022, 8:29 AM

## 2022-05-12 DIAGNOSIS — G40901 Epilepsy, unspecified, not intractable, with status epilepticus: Secondary | ICD-10-CM | POA: Diagnosis not present

## 2022-05-12 LAB — GLUCOSE, CAPILLARY
Glucose-Capillary: 101 mg/dL — ABNORMAL HIGH (ref 70–99)
Glucose-Capillary: 108 mg/dL — ABNORMAL HIGH (ref 70–99)
Glucose-Capillary: 90 mg/dL (ref 70–99)
Glucose-Capillary: 108 mg/dL — ABNORMAL HIGH (ref 70–99)
Glucose-Capillary: 91 mg/dL (ref 70–99)
Glucose-Capillary: 96 mg/dL (ref 70–99)
Glucose-Capillary: 95 mg/dL (ref 70–99)

## 2022-05-12 LAB — CULTURE, BLOOD (ROUTINE X 2)
Culture: NO GROWTH
Culture: NO GROWTH
Special Requests: ADEQUATE

## 2022-05-12 NOTE — Progress Notes (Signed)
PROGRESS NOTE    Crystal Spence  KVQ:[id number] DOB: [birth year] DOA: 04/30/2022 PCP: Trey Sailors, PA   Brief Narrative:  46 y.o. 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  CT of the head was negative for acute abnormality.  She was treated with IV Ativan and Keppra.  Neurology was consulted and patient was transferred to [facility] and started on EEG.  Assessment & Plan:  Status epilepticus, resolving Patient was seen by neurology.  Currently on Vimpat, phenobarbital and Keppra. Continue to wean phenobarbital per neurology recommendations Patient off of LTM.  MRI brain without any acute findings or lesions. Mentation appears to be gradually improving. Transition to p.o. antiepileptics once p.o. route is safely established, see below Follow-up with neurology in 3 months as scheduled  Aspiration pneumonia/Acute respiratory failure with hypoxia, resolving Transient fever on the 25th without recurrence, weaning oxygen currently on room air  Chest x-ray concerning for pneumonia, likely aspiration given below Completed Unasyn course Influenza positive at intake, unlikely etiology for symptoms given timing( diagnosed a week prior to admission) as well as aspiration due to dysphagia is much more likely and consistent with imaging.  Oropharyngeal dysphagia, ongoing -Speech therapy following -dysphagia likely secondary to chronic tissue changes in the setting of previous radiation treatment -Unlikely to recover acutely - discussed PEG tube placement with patient, she is agreeable.  She reports having multiple PEG tube placed in the past during laryngeal cancer treatment and is well aware of the indications, risks and  benefits. -NG tube feeds ongoing until PEG tube can be placed  Influenza A/bronchitis, resolving -Diagnosed on 04/29/2022 -Discontinue precautions -No indication for Tamiflu at this time  Essential hypertension Continue diltiazem. Blood pressures are reasonably well-controlled  Leukocytosis Likely reactive, downtrending appropriately  Hypophosphatemia and hypomagnesemia Supplemented.  Hypothyroidism Continue levothyroxine  Hypokalemia Continue to follow  History of laryngeal cancer, resolved Cancer free for years, discharged from oncology follow-up in 2020    DVT prophylaxis: Lovenox Code Status: Full Family Communication: At bedside Disposition Plan: Eventually she will need to go to skilled nursing facility.  PEG tube will need to be placed prior to discharge.  Status is: Inpatient Remains inpatient appropriate because: Status epilepticus  Consultants: Neurology Procedures: LTM EEG Antimicrobials: None  Subjective: No acute issues or events overnight, did not do well with swallow evaluation yesterday, agreeable for PEG tube placement -denies nausea vomiting diarrhea constipation headache fevers chills or chest pain  Objective: Vitals:   05/12/22 0426 05/12/22 0600 05/12/22 0700 05/12/22 1131  BP: 112/80  124/88 112/78  Pulse: 91  80 87  Resp: 18  16   Temp: 98.3 F (36.8 C)  98.6 F (37 C) 98.5 F (36.9 C)  TempSrc:   Oral Oral  SpO2: 97%  96% 94%  Weight:  48.2 kg    Height:        Intake/Output Summary (Last 24 hours) at 05/12/2022 1235 Last data filed at 05/12/2022 1000 Gross per 24 hour  Intake 140 ml  Output 150 ml  Net -10 ml    Filed Weights   05/09/22 0500 05/10/22 0540 05/12/22 0600  Weight: 41.1 kg 42.5 kg 48.2 kg    Examination:  General appearance: Awake alert.  In no distress.  Oriented x 4 Resp: Mildly  tachypneic.  No use of accessory muscles.  Coarse breath sounds bilaterally.  Crackles at the bases. Cardio: S1-S2 is normal  regular.  No S3-S4.  No rubs murmurs or bruit GI: Abdomen is soft.  Nontender nondistended.  Bowel sounds are present normal.  No masses organomegaly Extremities: No edema.    Data Reviewed: I have personally reviewed following labs and imaging studies  CBC: Recent Labs  Lab 05/06/22 0711 05/08/22 0622 05/10/22 0634  WBC 8.6 11.5* 4.5  HGB 13.4 12.5 11.5*  HCT 39.6 36.9 33.8*  MCV 98.3 97.1 95.8  PLT 216 212 329    Basic Metabolic Panel: Recent Labs  Lab 05/06/22 0711 05/06/22 1548 05/07/22 0448 05/07/22 1649 05/08/22 0622 05/08/22 1817 05/10/22 0634  NA 144  --  142  --  139  --  138  K 4.2  --  3.6  --  4.2  --  3.8  CL 118*  --  110  --  107  --  106  CO2 19*  --  24  --  22  --  27  GLUCOSE 85  --  111*  --  104*  --  104*  BUN <5*  --  5*  --  9  --  8  CREATININE 0.83  --  0.93  --  0.86  --  0.86  CALCIUM 7.6*  --  8.6*  --  8.5*  --  8.4*  MG 2.0 1.9 2.0 1.8 1.8 1.7  --   PHOS 3.8 2.8 2.9 3.6 3.6 3.7  --     GFR: Estimated Creatinine Clearance: 60.4 mL/min (by C-G formula based on SCr of 0.86 mg/dL). Liver Function Tests: No results for input(s): "AST", "ALT", "ALKPHOS", "BILITOT", "PROT", "ALBUMIN" in the last 168 hours.   CBG: Recent Labs  Lab 05/11/22 1215 05/11/22 2318 05/12/22 0425 05/12/22 0806 05/12/22 1222  GLUCAP 98 105* 101* 108* 90      Recent Results (from the past 240 hour(s))  Culture, blood (Routine X 2) w Reflex to ID Panel     Status: None   Collection Time: 05/07/22  9:17 AM   Specimen: BLOOD  Result Value Ref Range Status   Specimen Description BLOOD LEFT ANTECUBITAL  Final   Special Requests   Final    BOTTLES DRAWN AEROBIC AND ANAEROBIC Blood Culture adequate volume   Culture   Final    NO GROWTH 5 DAYS Performed at [facility], [address] [place], [place] [address]    Report Status 05/12/2022 FINAL  Final  Culture, blood (Routine X 2) w Reflex to ID Panel     Status: None   Collection Time: 05/07/22   9:17 AM   Specimen: BLOOD LEFT HAND  Result Value Ref Range Status   Specimen Description BLOOD LEFT HAND  Final   Special Requests   Final    BOTTLES DRAWN AEROBIC AND ANAEROBIC Blood Culture results may not be optimal due to an inadequate volume of blood received in culture bottles   Culture   Final    NO GROWTH 5 DAYS Performed at [facility], [place] [address], [place], [place] [address]    Report Status 05/12/2022 FINAL  Final    Radiology Studies: No results found.  Scheduled Meds:  diltiazem  30 mg Per Tube Q8H   enoxaparin (LOVENOX) injection  20 mg Subcutaneous Q24H   free water  100 mL Per Tube Q6H   levothyroxine  75 mcg Per  Tube QAC breakfast   LORazepam  0.5 mg Intravenous Once   PHENObarbital  65 mg Intravenous Daily   Followed by   Derrill Memo ON 05/14/2022] PHENObarbital  32.5 mg Intravenous Daily   polyethylene glycol  17 g Oral BID   sodium chloride flush  10-40 mL Intracatheter Q12H   sodium chloride flush  3 mL Intravenous Q12H   Continuous Infusions:  feeding supplement (OSMOLITE 1.2 CAL) 1,000 mL (05/12/22 0240)   lacosamide (VIMPAT) IV 100 mg (05/12/22 1109)   levETIRAcetam 1,000 mg (05/12/22 1024)    Little Ishikawa, DO [facility] 05/12/2022, 12:35 PM

## 2022-05-12 NOTE — Plan of Care (Signed)
°  Problem: Clinical Measurements: °Goal: Will remain free from infection °Outcome: Progressing °  °Problem: Activity: °Goal: Risk for activity intolerance will decrease °Outcome: Progressing °  °Problem: Nutrition: °Goal: Adequate nutrition will be maintained °Outcome: Progressing °  °

## 2022-05-13 DIAGNOSIS — G40901 Epilepsy, unspecified, not intractable, with status epilepticus: Secondary | ICD-10-CM | POA: Diagnosis not present

## 2022-05-13 LAB — CBC
HCT: 33.7 % — ABNORMAL LOW (ref 36.0–46.0)
Hemoglobin: 11.2 g/dL — ABNORMAL LOW (ref 12.0–15.0)
MCH: 32.2 pg (ref 26.0–34.0)
MCHC: 33.2 g/dL (ref 30.0–36.0)
MCV: 96.8 fL (ref 80.0–100.0)
Platelets: 356 10*3/uL (ref 150–400)
RBC: 3.48 MIL/uL — ABNORMAL LOW (ref 3.87–5.11)
RDW: 17.3 % — ABNORMAL HIGH (ref 11.5–15.5)
WBC: 5.8 10*3/uL (ref 4.0–10.5)
nRBC: 0 % (ref 0.0–0.2)

## 2022-05-13 LAB — BASIC METABOLIC PANEL
Anion gap: 10 (ref 5–15)
BUN: 10 mg/dL (ref 6–20)
CO2: 27 mmol/L (ref 22–32)
Calcium: 8.5 mg/dL — ABNORMAL LOW (ref 8.9–10.3)
Chloride: 96 mmol/L — ABNORMAL LOW (ref 98–111)
Creatinine, Ser: 0.82 mg/dL (ref 0.44–1.00)
GFR, Estimated: >60 mL/min (ref >60)
Glucose, Bld: 83 mg/dL (ref 70–99)
Potassium: 3.6 mmol/L (ref 3.5–5.1)
Sodium: 133 mmol/L — ABNORMAL LOW (ref 135–145)

## 2022-05-13 LAB — GLUCOSE, CAPILLARY
Glucose-Capillary: 105 mg/dL — ABNORMAL HIGH (ref 70–99)
Glucose-Capillary: 107 mg/dL — ABNORMAL HIGH (ref 70–99)
Glucose-Capillary: 110 mg/dL — ABNORMAL HIGH (ref 70–99)
Glucose-Capillary: 111 mg/dL — ABNORMAL HIGH (ref 70–99)
Glucose-Capillary: 114 mg/dL — ABNORMAL HIGH (ref 70–99)
Glucose-Capillary: 91 mg/dL (ref 70–99)

## 2022-05-13 MED ORDER — DIPHENHYDRAMINE HCL 12.5 MG/5ML PO ELIX
6.2500 mg | ORAL_SOLUTION | Freq: Four times a day (QID) | ORAL | Status: DC | PRN
  Administered 2022-05-13 – 2022-05-16 (×7): 6.25 mg
  Filled 2022-05-13 (×9): qty 5

## 2022-05-13 NOTE — Progress Notes (Signed)
Mobility Specialist: Progress Note   05/13/22 1009  Mobility  Activity Ambulated with assistance in hallway  Level of Assistance Contact guard assist, steadying assist  Assistive Device Front wheel walker  Distance Ambulated (ft) 300 ft  Activity Response Tolerated well  Mobility Referral Yes  $Mobility charge 1 Mobility   Pt received in the bed and agreeable to mobility. Mod I with bed mobility and contact guard during ambulation. Verbal cues for RW proximity and physical assist for direction around obstacles in the hallway. Pt has tendency to pick RW up to avoid obstacles. No c/o throughout. Pt back to bed after session with call bell and phone at her side. Bed alarm is on.   [place] Mobility Specialist Please contact via SecureChat or Rehab office at [phone]

## 2022-05-13 NOTE — TOC Progression Note (Signed)
Transition of Care [facility]) - Progression Note    Patient Details  Name: Crystal Spence MRN: [id number] Date of Birth: 1976/02/06  Transition of Care [facility]) CM/SW [place], [place] Phone Number: 05/13/2022, 11:48 AM  Clinical Narrative:     CSW spoke with Jackelyn Poling at [facility], she states she can accept pt and will start auth today. TOC will continue to follow.   Expected Discharge Plan: [place] Barriers to Discharge: Continued Medical Work up  Expected Discharge Plan and [place] arrangements for the past 2 months: Hotel/Motel (Janett Billow reports pt has lived at a hotel for the last 3 years)                                       Social Determinants of Health ([place]) Interventions [place]: No Food Insecurity (05/01/2022)  Housing: Low Risk  (05/01/2022)  Transportation Needs: No Transportation Needs (05/01/2022)  Utilities: Not At Risk (05/01/2022)  Financial Resource Strain: Medium Risk (01/17/2018)  Tobacco Use: Medium Risk (05/01/2022)    Readmission Risk Interventions     No data to display

## 2022-05-13 NOTE — Progress Notes (Signed)
PROGRESS NOTE    Crystal Spence  RJJ:[id number] DOB: [birth year] DOA: 04/30/2022 PCP: Trey Sailors, PA   Brief Narrative:  47 y.o. 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  CT of the head was negative for acute abnormality.  She was treated with IV Ativan and Keppra.  Neurology was consulted and patient was transferred to [facility] and started on EEG.  Assessment & Plan:  Status epilepticus, resolved Acute metabolic encephalopathy, POA, resolved Patient was seen by neurology.  Currently on Vimpat, phenobarbital and Keppra. Continue to wean phenobarbital per neurology recommendations Patient off of LTM.  MRI brain without any acute findings or lesions. Mentation appears to be gradually improving. Transition to p.o. antiepileptics once p.o. route is safely established, see below Follow-up with neurology in 3 months as scheduled  Aspiration pneumonia/Acute respiratory failure with hypoxia, resolving Transient fever on the 25th without recurrence, weaning oxygen currently on room air  Chest x-ray concerning for pneumonia, likely aspiration given below Completed Unasyn course Influenza positive at intake, unlikely etiology for symptoms given timing( diagnosed a week prior to admission) as well as aspiration due to dysphagia is much more likely and consistent with imaging.  Oropharyngeal dysphagia, ongoing -Speech therapy following -dysphagia likely secondary to chronic tissue changes in the setting of previous radiation treatment -Unlikely to recover acutely - discussed PEG tube placement with patient, she is agreeable.  She reports having multiple PEG tube placed in the past during laryngeal cancer treatment and is well  aware of the indications, risks and benefits. -NG tube feeds ongoing until PEG tube can be placed  Influenza A/bronchitis, resolving -Diagnosed on 04/29/2022 -Discontinue precautions -No indication for Tamiflu at this time  Ambulatory dysfunction, improving -Patient poorly ambulatory at intake likely secondary to above mental status and seizures. -Ambulating today with staff, requiring minimal assistance, hopefully patient will be stable for discharge home tentatively with home health once PEG tube has been placed  Essential hypertension Continue diltiazem. Blood pressures are reasonably well-controlled  Leukocytosis Likely reactive, downtrending appropriately  Hypophosphatemia and hypomagnesemia Supplemented.  Hypothyroidism Continue levothyroxine  Hypokalemia Continue to follow  History of laryngeal cancer, resolved Cancer free for years, discharged from oncology follow-up in 2020    DVT prophylaxis: Lovenox Code Status: Full Family Communication: At bedside Disposition Plan: Initial plan was discharged to SNF however given patient's marked improvement over the past 72 hours and improved ambulation patient may be reasonable to discharge to home or with family/friends pending her ongoing clinical improvement.  Discharge pending PEG tube placement.  Status is: Inpatient Remains inpatient appropriate because: Status epilepticus  Consultants: Neurology Procedures: LTM EEG Antimicrobials: None  Subjective: No acute issues or events overnight, much more awake alert oriented and ambulatory today.  Denies nausea vomiting diarrhea constipation any fevers chills or chest pain  Objective: Vitals:   05/12/22 1610 05/12/22 2137 05/13/22 0450 05/13/22 0723  BP: 101/72 (!) 121/92 121/81 126/86  Pulse: 72 79 93 90  Resp: '14 16 18 16  '$ Temp: 98.2 F (36.8 C) 98.6 F (37 C) 98.5 F (36.9 C) 98 F (36.7 C)  TempSrc: Oral Oral Oral   SpO2:  97% 95% 96%  Weight:      Height:         Intake/Output Summary (Last  24 hours) at 05/13/2022 0742 Last data filed at 05/13/2022 0451 Gross per 24 hour  Intake 1830 ml  Output 150 ml  Net 1680 ml    Filed Weights   05/09/22 0500 05/10/22 0540 05/12/22 0600  Weight: 41.1 kg 42.5 kg 48.2 kg    Examination:  General appearance: Awake alert.  In no distress.  Oriented x 4 Resp: Mildly tachypneic.  No use of accessory muscles.  Coarse breath sounds bilaterally.  Crackles at the bases. Cardio: S1-S2 is normal regular.  No S3-S4.  No rubs murmurs or bruit GI: Abdomen is soft.  Nontender nondistended.  Bowel sounds are present normal.  No masses organomegaly Extremities: No edema.    Data Reviewed: I have personally reviewed following labs and imaging studies  CBC: Recent Labs  Lab 05/08/22 0622 05/10/22 0634 05/13/22 0450  WBC 11.5* 4.5 5.8  HGB 12.5 11.5* 11.2*  HCT 36.9 33.8* 33.7*  MCV 97.1 95.8 96.8  PLT 212 284 921    Basic Metabolic Panel: Recent Labs  Lab 05/06/22 1548 05/07/22 0448 05/07/22 1649 05/08/22 0622 05/08/22 1817 05/10/22 0634 05/13/22 0450  NA  --  142  --  139  --  138 133*  K  --  3.6  --  4.2  --  3.8 3.6  CL  --  110  --  107  --  106 96*  CO2  --  24  --  22  --  27 27  GLUCOSE  --  111*  --  104*  --  104* 83  BUN  --  5*  --  9  --  8 10  CREATININE  --  0.93  --  0.86  --  0.86 0.82  CALCIUM  --  8.6*  --  8.5*  --  8.4* 8.5*  MG 1.9 2.0 1.8 1.8 1.7  --   --   PHOS 2.8 2.9 3.6 3.6 3.7  --   --     GFR: Estimated Creatinine Clearance: 63.3 mL/min (by C-G formula based on SCr of 0.82 mg/dL). Liver Function Tests: No results for input(s): "AST", "ALT", "ALKPHOS", "BILITOT", "PROT", "ALBUMIN" in the last 168 hours.   CBG: Recent Labs  Lab 05/12/22 1240 05/12/22 1627 05/12/22 1944 05/12/22 2326 05/13/22 0341  GLUCAP 108* 91 96 95 110*      Recent Results (from the past 240 hour(s))  Culture, blood (Routine X 2) w Reflex to ID Panel     Status: None   Collection  Time: 05/07/22  9:17 AM   Specimen: BLOOD  Result Value Ref Range Status   Specimen Description BLOOD LEFT ANTECUBITAL  Final   Special Requests   Final    BOTTLES DRAWN AEROBIC AND ANAEROBIC Blood Culture adequate volume   Culture   Final    NO GROWTH 5 DAYS Performed at [facility], [place] [address] [place], [place] [address]    Report Status 05/12/2022 FINAL  Final  Culture, blood (Routine X 2) w Reflex to ID Panel     Status: None   Collection Time: 05/07/22  9:17 AM   Specimen: BLOOD LEFT HAND  Result Value Ref Range Status   Specimen Description BLOOD LEFT HAND  Final   Special Requests   Final    BOTTLES DRAWN AEROBIC AND ANAEROBIC Blood Culture results may not be optimal due to an inadequate volume of blood received in culture bottles   Culture   Final    NO GROWTH  5 DAYS Performed at [facility], [place] [address] [place], [place] [address]    Report Status 05/12/2022 FINAL  Final    Radiology Studies: No results found.  Scheduled Meds:  diltiazem  30 mg Per Tube Q8H   enoxaparin (LOVENOX) injection  20 mg Subcutaneous Q24H   free water  100 mL Per Tube Q6H   levothyroxine  75 mcg Per Tube QAC breakfast   LORazepam  0.5 mg Intravenous Once   PHENObarbital  65 mg Intravenous Daily   Followed by   Derrill Memo ON 05/14/2022] PHENObarbital  32.5 mg Intravenous Daily   polyethylene glycol  17 g Oral BID   sodium chloride flush  10-40 mL Intracatheter Q12H   sodium chloride flush  3 mL Intravenous Q12H   Continuous Infusions:  feeding supplement (OSMOLITE 1.2 CAL) 1,000 mL (05/12/22 2357)   lacosamide (VIMPAT) IV 100 mg (05/12/22 2235)   levETIRAcetam 1,000 mg (05/12/22 2214)    Little Ishikawa, DO [facility] 05/13/2022, 7:42 AM

## 2022-05-13 NOTE — TOC Progression Note (Signed)
Transition of Care [facility]) - Progression Note    Patient Details  Name: Crystal Spence MRN: [id number] Date of Birth: 08-13-1975  Transition of Care [facility]) CM/SW [place], [place] Phone Number: 05/13/2022, 10:48 AM  Clinical Narrative:     CSW spoke with Jackelyn Poling at [facility], she is requesting CSW call her back in about an hour to check on bed offer.    Expected Discharge Plan: [place] Barriers to Discharge: Continued Medical Work up  Expected Discharge Plan and [place] arrangements for the past 2 months: Hotel/Motel (Janett Billow reports pt has lived at a hotel for the last 3 years)                                       Social Determinants of Health ([place]) Interventions [place]: No Food Insecurity (05/01/2022)  Housing: Low Risk  (05/01/2022)  Transportation Needs: No Transportation Needs (05/01/2022)  Utilities: Not At Risk (05/01/2022)  Financial Resource Strain: Medium Risk (01/17/2018)  Tobacco Use: Medium Risk (05/01/2022)    Readmission Risk Interventions     No data to display

## 2022-05-14 DIAGNOSIS — G40901 Epilepsy, unspecified, not intractable, with status epilepticus: Secondary | ICD-10-CM | POA: Diagnosis not present

## 2022-05-14 LAB — GLUCOSE, CAPILLARY
Glucose-Capillary: 112 mg/dL — ABNORMAL HIGH (ref 70–99)
Glucose-Capillary: 109 mg/dL — ABNORMAL HIGH (ref 70–99)
Glucose-Capillary: 94 mg/dL (ref 70–99)
Glucose-Capillary: 96 mg/dL (ref 70–99)
Glucose-Capillary: 106 mg/dL — ABNORMAL HIGH (ref 70–99)
Glucose-Capillary: 94 mg/dL (ref 70–99)

## 2022-05-14 MED ORDER — ENOXAPARIN SODIUM 30 MG/0.3ML IJ SOSY: 30.0000 mg | PREFILLED_SYRINGE | Freq: Every day | INTRAMUSCULAR | Status: DC

## 2022-05-14 NOTE — Progress Notes (Signed)
PROGRESS NOTE    Crystal Spence  VZS:[id number] DOB: November 23, 1975 DOA: 04/30/2022 PCP: Trey Sailors, PA   Brief Narrative:  47 y.o. 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  CT of the head was negative for acute abnormality.  She was treated with IV Ativan and Keppra.  Neurology was consulted and patient was transferred to [facility] and started on EEG.  Assessment & Plan:  Status epilepticus, resolved Acute metabolic encephalopathy, POA, resolved Patient was seen by neurology.  Currently on Vimpat, phenobarbital and Keppra. Continue to wean phenobarbital per neurology recommendations Patient off of LTM.  MRI brain without any acute findings or lesions. Mentation appears to be gradually improving. Transition to p.o. antiepileptics once p.o. route is safely established, see below Follow-up with neurology in 3 months as scheduled  Aspiration pneumonia/Acute respiratory failure with hypoxia, resolving Transient fever on the 25th without recurrence, weaning oxygen currently on room air  Chest x-ray concerning for pneumonia, likely aspiration given below Completed Unasyn course Influenza positive at intake, unlikely etiology for symptoms given timing( diagnosed a week prior to admission) as well as aspiration due to dysphagia is much more likely and consistent with imaging.  Oropharyngeal dysphagia, ongoing -Speech therapy following -dysphagia likely secondary to chronic tissue changes in the setting of previous radiation treatment -Unlikely to recover acutely - discussed PEG tube placement with patient, she is agreeable.  She reports having multiple PEG tube placed in the past during laryngeal cancer treatment and is well  aware of the indications, risks and benefits. -NG tube feeds ongoing until PEG tube can be placed  Influenza A/bronchitis, resolving -Diagnosed on 04/29/2022 -Discontinue precautions -No indication for Tamiflu at this time  Ambulatory dysfunction, improving -Patient poorly ambulatory at intake likely secondary to above mental status and seizures. -Ambulating today with staff, requiring minimal assistance, hopefully patient will be stable for discharge home tentatively with home health once PEG tube has been placed  Essential hypertension Continue diltiazem. Blood pressures are reasonably well-controlled  Leukocytosis Likely reactive, downtrending appropriately  Hypophosphatemia and hypomagnesemia Supplemented.  Hypothyroidism Continue levothyroxine  Hypokalemia Continue to follow  History of laryngeal cancer, resolved Cancer free for years, discharged from oncology follow-up in 2020    DVT prophylaxis: Lovenox Code Status: Full Family Communication: At bedside Disposition Plan: Initial plan was discharged to SNF however given patient's marked improvement over the past 72 hours and improved ambulation patient may be reasonable to discharge to home or with family/friends pending her ongoing clinical improvement.  Discharge pending PEG tube placement.  Status is: Inpatient Remains inpatient appropriate because: Status epilepticus  Consultants: Neurology Procedures: LTM EEG Antimicrobials: None  Subjective: No acute issues or events overnight, much more awake alert oriented and ambulatory today.  Denies nausea vomiting diarrhea constipation any fevers chills or chest pain  Objective: Vitals:   05/13/22 2020 05/13/22 2340 05/14/22 0422 05/14/22 0806  BP: 106/79 97/73 119/83 115/84  Pulse: 84 80 88 88  Resp: '16 18 18 20  '$ Temp: 97.8 F (36.6 C) 98.7 F (37.1 C) 98.5 F (36.9 C) 99.1 F (37.3 C)  TempSrc: Oral Axillary Oral Oral  SpO2: 97% 99% 97% 96%  Weight:       Height:        Intake/Output Summary (Last 24  hours) at 05/14/2022 0815 Last data filed at 05/14/2022 5053 Gross per 24 hour  Intake 270 ml  Output 900 ml  Net -630 ml    Filed Weights   05/09/22 0500 05/10/22 0540 05/12/22 0600  Weight: 41.1 kg 42.5 kg 48.2 kg    Examination:  General appearance: Awake alert.  In no distress.  Oriented x 4 Resp: Mildly tachypneic.  No use of accessory muscles.  Coarse breath sounds bilaterally.  Crackles at the bases. Cardio: S1-S2 is normal regular.  No S3-S4.  No rubs murmurs or bruit GI: Abdomen is soft.  Nontender nondistended.  Bowel sounds are present normal.  No masses organomegaly Extremities: No edema.    Data Reviewed: I have personally reviewed following labs and imaging studies  CBC: Recent Labs  Lab 05/08/22 0622 05/10/22 0634 05/13/22 0450  WBC 11.5* 4.5 5.8  HGB 12.5 11.5* 11.2*  HCT 36.9 33.8* 33.7*  MCV 97.1 95.8 96.8  PLT 212 284 976    Basic Metabolic Panel: Recent Labs  Lab 05/07/22 1649 05/08/22 0622 05/08/22 1817 05/10/22 0634 05/13/22 0450  NA  --  139  --  138 133*  K  --  4.2  --  3.8 3.6  CL  --  107  --  106 96*  CO2  --  22  --  27 27  GLUCOSE  --  104*  --  104* 83  BUN  --  9  --  8 10  CREATININE  --  0.86  --  0.86 0.82  CALCIUM  --  8.5*  --  8.4* 8.5*  MG 1.8 1.8 1.7  --   --   PHOS 3.6 3.6 3.7  --   --     GFR: Estimated Creatinine Clearance: 63.3 mL/min (by C-G formula based on SCr of 0.82 mg/dL). Liver Function Tests: No results for input(s): "AST", "ALT", "ALKPHOS", "BILITOT", "PROT", "ALBUMIN" in the last 168 hours.   CBG: Recent Labs  Lab 05/13/22 1652 05/13/22 2023 05/13/22 2345 05/14/22 0416 05/14/22 0808  GLUCAP 91 111* 107* 109* 94      Recent Results (from the past 240 hour(s))  Culture, blood (Routine X 2) w Reflex to ID Panel     Status: None   Collection Time: 05/07/22  9:17 AM   Specimen: BLOOD  Result Value Ref Range Status   Specimen Description BLOOD  LEFT ANTECUBITAL  Final   Special Requests   Final    BOTTLES DRAWN AEROBIC AND ANAEROBIC Blood Culture adequate volume   Culture   Final    NO GROWTH 5 DAYS Performed at [facility], [address] [place], [place] [address]    Report Status 05/12/2022 FINAL  Final  Culture, blood (Routine X 2) w Reflex to ID Panel     Status: None   Collection Time: 05/07/22  9:17 AM   Specimen: BLOOD LEFT HAND  Result Value Ref Range Status   Specimen Description BLOOD LEFT HAND  Final   Special Requests   Final    BOTTLES DRAWN AEROBIC AND ANAEROBIC Blood Culture results may not be optimal due to an inadequate volume of blood received in culture bottles   Culture   Final    NO GROWTH 5 DAYS Performed at [facility], [place] [address] [place],  [address]    Report Status 05/12/2022 FINAL  Final    Radiology Studies: No results found.  Scheduled Meds:  diltiazem  30 mg Per  Tube Q8H   enoxaparin (LOVENOX) injection  20 mg Subcutaneous Q24H   free water  100 mL Per Tube Q6H   levothyroxine  75 mcg Per Tube QAC breakfast   LORazepam  0.5 mg Intravenous Once   PHENObarbital  32.5 mg Intravenous Daily   polyethylene glycol  17 g Oral BID   sodium chloride flush  10-40 mL Intracatheter Q12H   sodium chloride flush  3 mL Intravenous Q12H   Continuous Infusions:  feeding supplement (OSMOLITE 1.2 CAL) 1,000 mL (05/13/22 1711)   lacosamide (VIMPAT) IV 100 mg (05/13/22 2232)   levETIRAcetam 1,000 mg (05/13/22 2115)    Little Ishikawa, DO [facility] 05/14/2022, 8:15 AM

## 2022-05-14 NOTE — Progress Notes (Signed)
Per Debbie with [facility], she received a call from [facility] and they are prepared to approve SNF. Pt is awaiting PEG placement. Will need to update [facility] with EDD when known. MD updated.   Wandra Feinstein, MSW, LCSW [phone] (coverage)

## 2022-05-14 NOTE — Progress Notes (Signed)
Physical Therapy Treatment Patient Details Name: Crystal Spence MRN: [id number] DOB: 1976-04-10 Today's Date: 05/14/2022   History of Present Illness Pt is a 47 y/o F presenting with seizures. 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 Pt able to demonstrate gait without AD for increased hallway distance with up to min a needed to steady intermittently. Pt scoring 15/24 on the DGI without DME use, indicating pt continues to be at high risk for falls without RW use. Discussed DME use post-acutely, especially when outside of home/in community with pt verbalizing understanding of use and benefits. Pt continues to benefit from skilled PT services to progress toward functional mobility goals.    Recommendations for follow up therapy are one component of a multi-disciplinary discharge planning process, led by the attending physician.  Recommendations may be updated based on patient status, additional functional criteria and insurance authorization.  Follow Up Recommendations  Skilled nursing-short term rehab (<3 hours/day) Can patient physically be transported by private vehicle: No   Assistance Recommended at Discharge Frequent or constant Supervision/Assistance  Patient can return home with the following Two people to help with walking and/or transfers;Two people to help with bathing/dressing/bathroom;Direct supervision/assist for medications management;Assistance with feeding;Assistance with cooking/housework;Direct supervision/assist for financial management;Assist for transportation;Help with stairs or ramp for entrance    Equipment Recommendations  Other (comment) (TBD)    Recommendations for Other Services       Precautions / Restrictions Precautions Precautions: Fall Restrictions Weight Bearing Restrictions: No     Mobility  Bed Mobility Overal bed mobility: Needs Assistance Bed Mobility: Supine to Sit     Supine to sit: Supervision Sit to supine: Min guard   General bed mobility comments: min guard for safety    Transfers Overall transfer level: Needs assistance Equipment used: None Transfers: Sit to/from Stand Sit to Stand: Min guard           General transfer comment: min guard for safety    Ambulation/Gait Ambulation/Gait assistance: Min assist, Min guard Gait Distance (Feet): 300 Feet Assistive device: None Gait Pattern/deviations: Step-through pattern, Decreased stride length Gait velocity: decr     General Gait Details: min assist to steady intermittently, especially during environmental scanning, no overt LOB noted   Stairs Stairs: Yes Stairs assistance: Min guard Stair Management: Two rails, Alternating pattern, Forwards Number of Stairs: 4 General stair comments: up/down steps in therapy gym, no LOB   Wheelchair Mobility    Modified Rankin (Stroke Patients Only)       Balance Overall balance assessment: Needs assistance Sitting-balance support: Bilateral upper extremity supported, Feet supported Sitting balance-Leahy Scale: Fair Sitting balance - Comments: able to maintain static sitting without assist Postural control: Posterior lean, Left lateral lean Standing balance support: During functional activity, No upper extremity supported Standing balance-Leahy Scale: Fair                   Standardized Balance Assessment Standardized Balance Assessment : Dynamic Gait Index   Dynamic Gait Index Level Surface: Normal Change in Gait Speed: Mild Impairment Gait with Horizontal Head Turns: Moderate Impairment Gait with Vertical Head Turns:  Mild Impairment Gait and Pivot Turn: Moderate  Impairment Step Over Obstacle: Mild Impairment Step Around Obstacles: Mild Impairment Steps: Mild Impairment Total Score: 15      Cognition Arousal/Alertness: Awake/alert Behavior During Therapy: WFL for tasks assessed/performed Overall Cognitive Status: Within Functional Limits for tasks assessed                                          Exercises      General Comments General comments (skin integrity, edema, etc.): VSS on RA      Pertinent Vitals/Pain Pain Assessment Pain Assessment: No/denies pain    Home Living                          Prior Function            PT Goals (current goals can now be found in the care plan section) Acute Rehab PT Goals PT Goal Formulation: With patient/family Time For Goal Achievement: 05/17/22 Progress towards PT goals: Progressing toward goals    Frequency    Min 2X/week      PT Plan      Co-evaluation              AM-PAC PT "6 Clicks" Mobility   Outcome Measure  Help needed turning from your back to your side while in a flat bed without using bedrails?: A Little Help needed moving from lying on your back to sitting on the side of a flat bed without using bedrails?: A Little Help needed moving to and from a bed to a chair (including a wheelchair)?: A Little Help needed standing up from a chair using your arms (e.g., wheelchair or bedside chair)?: A Little Help needed to walk in hospital room?: A Little Help needed climbing 3-5 steps with a railing? : A Little 6 Click Score: 18    End of Session Equipment Utilized During Treatment: Gait belt Activity Tolerance: Patient tolerated treatment well Patient left: with call bell/phone within reach;in bed;with bed alarm set Nurse Communication: Mobility status PT Visit Diagnosis: Unsteadiness on feet (R26.81);Other abnormalities of gait and mobility (R26.89);Muscle weakness (generalized)  (M62.81);History of falling (Z91.81);Pain Pain - Right/Left: Right (shoulder and abdomen) Pain - part of body: Shoulder (and abdomen)     Time: 4765-4650 PT Time Calculation (min) (ACUTE ONLY): 15 min  Charges:  $Therapeutic Activity: 8-22 mins                     Rama Sorci R. PTA Acute Rehabilitation Services Office: [place] 05/14/2022, 12:18 PM

## 2022-05-15 ENCOUNTER — Inpatient Hospital Stay (HOSPITAL_COMMUNITY): Payer: Medicaid Other

## 2022-05-15 DIAGNOSIS — R1312 Dysphagia, oropharyngeal phase: Secondary | ICD-10-CM | POA: Diagnosis not present

## 2022-05-15 DIAGNOSIS — G40909 Epilepsy, unspecified, not intractable, without status epilepticus: Secondary | ICD-10-CM

## 2022-05-15 HISTORY — PX: IR GASTROSTOMY TUBE MOD SED: IMG625

## 2022-05-15 LAB — GLUCOSE, CAPILLARY
Glucose-Capillary: 73 mg/dL (ref 70–99)
Glucose-Capillary: 81 mg/dL (ref 70–99)
Glucose-Capillary: 85 mg/dL (ref 70–99)
Glucose-Capillary: 88 mg/dL (ref 70–99)

## 2022-05-15 LAB — CREATININE, SERUM
Creatinine, Ser: 0.68 mg/dL (ref 0.44–1.00)
GFR, Estimated: >60 mL/min (ref >60)

## 2022-05-15 MED ORDER — FENTANYL CITRATE (PF) 100 MCG/2ML IJ SOLN
INTRAMUSCULAR | Status: AC | PRN
  Administered 2022-05-15 (×2): 25 ug via INTRAVENOUS

## 2022-05-15 MED ORDER — MIDAZOLAM HCL 2 MG/2ML IJ SOLN
INTRAMUSCULAR | Status: AC
  Filled 2022-05-15: qty 2

## 2022-05-15 MED ORDER — MIDAZOLAM HCL 2 MG/2ML IJ SOLN
INTRAMUSCULAR | Status: AC | PRN
  Administered 2022-05-15: 1 mg via INTRAVENOUS

## 2022-05-15 MED ORDER — CEFAZOLIN SODIUM-DEXTROSE 2-4 GM/100ML-% IV SOLN
INTRAVENOUS | Status: AC | PRN
  Administered 2022-05-15: 2 g via INTRAVENOUS

## 2022-05-15 MED ORDER — CEFAZOLIN SODIUM-DEXTROSE 2-4 GM/100ML-% IV SOLN
INTRAVENOUS | Status: AC
  Filled 2022-05-15: qty 100

## 2022-05-15 MED ORDER — DILTIAZEM 12 MG/ML ORAL SUSPENSION
30.0000 mg | Freq: Two times a day (BID) | ORAL | Status: DC
  Administered 2022-05-15 – 2022-05-17 (×4): 30 mg
  Filled 2022-05-15 (×5): qty 2.5

## 2022-05-15 MED ORDER — ENOXAPARIN SODIUM 30 MG/0.3ML IJ SOSY
30.0000 mg | PREFILLED_SYRINGE | INTRAMUSCULAR | Status: DC
  Administered 2022-05-16 – 2022-05-17 (×2): 30 mg via SUBCUTANEOUS
  Filled 2022-05-15 (×2): qty 0.3

## 2022-05-15 MED ORDER — OSMOLITE 1.2 CAL PO LIQD: 1000.0000 mL | ORAL | Status: DC

## 2022-05-15 MED ORDER — IOHEXOL 300 MG/ML  SOLN
100.0000 mL | Freq: Once | INTRAMUSCULAR | Status: AC | PRN
  Administered 2022-05-15: 20 mL

## 2022-05-15 MED ORDER — FENTANYL CITRATE (PF) 100 MCG/2ML IJ SOLN
INTRAMUSCULAR | Status: AC
  Filled 2022-05-15: qty 2

## 2022-05-15 MED ORDER — GLUCAGON HCL RDNA (DIAGNOSTIC) 1 MG IJ SOLR
INTRAMUSCULAR | Status: AC
  Filled 2022-05-15: qty 1

## 2022-05-15 MED ORDER — GLUCAGON HCL RDNA (DIAGNOSTIC) 1 MG IJ SOLR
INTRAMUSCULAR | Status: AC | PRN
  Administered 2022-05-15: 1 mg via INTRAVENOUS

## 2022-05-15 MED ORDER — LIDOCAINE HCL 1 % IJ SOLN
INTRAMUSCULAR | Status: AC
  Administered 2022-05-15: 10 mL
  Filled 2022-05-15: qty 20

## 2022-05-15 NOTE — Procedures (Signed)
Pre procedure Dx: Dysphagia Post Procedure Dx: Same  Successful fluoroscopic guided insertion of gastrostomy tube.   The gastrostomy tube may be used immediately for medications.   Tube feeds may be initiated in 24 hours as per the primary team.    EBL: Trace  Complications: None immediate  Jay Dorann Davidson, MD Pager #: [phone]     

## 2022-05-15 NOTE — Consult Note (Signed)
Chief Complaint: Patient was seen in consultation today for dysphagia.  Referring Physician(s): Dr. Holli Humbles  Supervising Physician: Sandi Mariscal  Patient Status: [facility] - In-pt  History of Present Illness: Crystal Spence is a 47 y.o. female admitted with influenza.  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  She has utilized g-tubes in the past but has managed for the past several years with PO nutrition.  Workup during admission included FEES by Speech Pathology revealing aspiration with all consistencies.  IR consulted for percutaneous gastrostomy.  Past Medical History:  Diagnosis Date   Anemia, unspecified 06/17/2013   Headache    History of laryngeal cancer 02/03/2013   Hypertension    Hypothyroidism (acquired) 06/17/2013   Multiple lung nodules 06/28/2014   Pneumonia 01/14/2014, 04/2017   Poor oral hygiene 06/17/2013   Rib pain 01/14/2014   Rib pain on left side 01/05/2014   Throat cancer [facility])     Past Surgical History:  Procedure Laterality Date   ABDOMINAL SURGERY     BIOPSY  01/18/2018   Procedure: BIOPSY;  Surgeon: Wonda Horner, MD;  Location: [place];  Service: Endoscopy;;   COLONOSCOPY WITH PROPOFOL N/A 10/12/2015   Procedure: COLONOSCOPY WITH PROPOFOL;  Surgeon: Wonda Horner, MD;  Location: [facility];  Service: Endoscopy;  Laterality: N/A;   ESOPHAGOGASTRODUODENOSCOPY N/A 02/03/2013   Procedure: ESOPHAGOGASTRODUODENOSCOPY (EGD);  Surgeon: Juanita Craver, MD;  Location: [facility];  Service: Endoscopy;  Laterality: N/A;   ESOPHAGOGASTRODUODENOSCOPY N/A 10/12/2015   Procedure: ESOPHAGOGASTRODUODENOSCOPY (EGD);  Surgeon: Wonda Horner, MD;  Location: [facility];  Service: Endoscopy;  Laterality: N/A;   ESOPHAGOGASTRODUODENOSCOPY (EGD) WITH PROPOFOL N/A 01/18/2018   Procedure: ESOPHAGOGASTRODUODENOSCOPY (EGD) WITH PROPOFOL;  Surgeon: Wonda Horner, MD;  Location: [facility] ENDOSCOPY;  Service: Endoscopy;  Laterality: N/A;    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  Medications: Prior to Admission medications   Medication Sig Start Date End Date Taking? Authorizing Provider  acetaminophen (TYLENOL) 500 MG tablet Take 500-1,000 mg by mouth every 6 (six) hours as needed for mild pain or headache.    [provider]  bisacodyl 5 MG EC tablet Take 5 mg by mouth daily as needed for mild constipation or moderate constipation.    [provider]  diltiazem (CARDIZEM CD) 240 MG 24 hr capsule Take 1 capsule (240 mg total) by mouth daily. 04/30/22   Redwine, Madison A, PA-C  DULoxetine (CYMBALTA) 30 MG capsule Take 1 capsule (30 mg total) by mouth 2 (two) times daily. Patient taking differently: Take 30 mg by mouth daily. 03/14/19   Jean Rosenthal, MD  famotidine (PEPCID) 20 MG tablet Take 1 tablet (20 mg total) by mouth daily. 12/31/21   Larene Pickett, PA-C  hydrOXYzine (VISTARIL) 50 MG capsule Take 1 capsule (50 mg total) by mouth at bedtime. 03/14/19   Jean Rosenthal, MD  levETIRAcetam (KEPPRA) 500 MG tablet Take 1 tablet (500 mg total) by mouth 2 (two) times daily. 04/30/22 07/29/22  Redwine, Madison A, PA-C  levothyroxine (SYNTHROID) 50 MCG tablet Take 1.5 tablets (75 mcg total) by mouth daily before breakfast. 08/16/21 11/14/21  [place] Donnamarie Poag, DO  omeprazole (PRILOSEC) 20 MG capsule Take  20 mg by mouth daily. 01/05/19   [provider]  ondansetron (ZOFRAN) 4 MG tablet Take 1 tablet (4 mg total) by mouth daily as needed for nausea or vomiting. 08/16/21 08/16/22  [place] Eric J, DO  oseltamivir (TAMIFLU) 75 MG capsule Take 1 capsule (75 mg total) by mouth every 12 (twelve) hours. 04/30/22   Milton Ferguson, MD  sucralfate (CARAFATE) 1 g tablet Take 1  tablet (1 g total) by mouth 4 (four) times daily -  with meals and at bedtime. 12/31/21   Larene Pickett, PA-C  topiramate (TOPAMAX) 50 MG tablet Take 2 tablets (100 mg total) by mouth 2 (two) times daily. Patient not taking: Reported on 08/14/2021 03/14/19   Jean Rosenthal, MD  traZODone (DESYREL) 100 MG tablet Take 1 tablet (100 mg total) by mouth at bedtime. 03/14/19   Jean Rosenthal, MD     Family History  Problem Relation Age of Onset   Cancer Maternal Grandmother        skin cancer    Social History   Socioeconomic History   Marital status: Single    Spouse name: Not on file   Number of children: Not on file   Years of education: Not on file   Highest education level: Not on file  Occupational History   Not on file  Tobacco Use   Smoking status: Former    Packs/day: 1.00    Years: 15.00    Total pack years: 15.00    Types: Cigarettes    Quit date: 09/11/2010    Years since quitting: 11.6   Smokeless tobacco: Never  Vaping Use   Vaping Use: Never used  Substance and Sexual Activity   Alcohol use: Yes    Alcohol/week: 2.0 standard drinks of alcohol    Types: 2 Cans of beer per week    Comment: rare   Drug use: No    Comment: occasional   Sexual activity: Never  Other Topics Concern   Not on file  Social History Narrative   Not on file   Social Determinants of Health   Financial Resource Strain: Medium Risk (01/17/2018)   Overall Financial Resource Strain (CARDIA)    Difficulty of Paying Living Expenses: Somewhat hard  Food Insecurity: No Food Insecurity (05/01/2022)   Hunger Vital Sign    Worried About Running Out of Food in the Last Year: Never true    Ran Out of Food in the Last Year: Never true  Transportation Needs: No Transportation Needs (05/01/2022)   PRAPARE - Hydrologist (Medical): No    Lack of Transportation (Non-Medical): No  Physical Activity: Not on file  Stress: Not on file  Social Connections: Not on file   Review  of Systems  Constitutional:  Positive for activity change, appetite change, diaphoresis, fatigue and fever.  Respiratory: Negative.    Cardiovascular: Negative.   Gastrointestinal:  Positive for diarrhea and nausea. Negative for abdominal distention and abdominal pain.  Endocrine: Negative.   Genitourinary: Negative.   Musculoskeletal:  Positive for myalgias.  Allergic/Immunologic: Negative.   Neurological:  Positive for headaches.  Hematological:  Bruises/bleeds easily.  Psychiatric/Behavioral: Negative.     Vital Signs: BP 107/77 (BP Location: Right Arm)   Pulse 90   Temp 98.7 F (37.1 C) (Oral)   Resp 18   Ht '5\' 1"'$  (1.549 m)   Wt 106 lb 4.2 oz (48.2 kg)   LMP 10/20/2012 Comment: pt. has had tubal ligation,no longer  has peroids  SpO2 95%   BMI 20.08 kg/m   Physical Exam Vitals reviewed.  Constitutional:      Appearance: She is ill-appearing.  HENT:     Head: Normocephalic and atraumatic.     Mouth/Throat:     Mouth: Mucous membranes are moist.     Pharynx: Oropharynx is clear.  Eyes:     Extraocular Movements: Extraocular movements intact.     Conjunctiva/sclera: Conjunctivae normal.  Cardiovascular:     Rate and Rhythm: Normal rate.     Pulses: Normal pulses.  Pulmonary:     Effort: Pulmonary effort is normal.     Breath sounds: Normal breath sounds.  Abdominal:     General: Abdomen is flat.     Palpations: Abdomen is soft.     Comments: Scar in epigastric region at prior g-tube placement site.  Skin:    General: Skin is warm and dry.  Neurological:     General: No focal deficit present.     Mental Status: She is alert and oriented to person, place, and time.  Psychiatric:        Mood and Affect: Mood normal.        Behavior: Behavior normal.     Imaging: DG Abd Portable 1V  Result Date: 05/08/2022 CLINICAL DATA:  Feeding tube placement. EXAM: PORTABLE ABDOMEN - 1 VIEW COMPARISON:  05/05/2022. FINDINGS: Feeding tube is followed into the stomach with  the tip projecting beyond the inferior margin of the image. Bowel gas pattern is grossly unremarkable. Numerous calcified granulomas in the lungs. Lung bases likely atelectasis. IMPRESSION: Feeding tube is followed into the stomach with the tip projecting beyond the inferior margin of the image. Electronically Signed   By: Lorin Picket M.D.   On: 05/08/2022 10:19   DG CHEST PORT 1 VIEW  Result Date: 05/06/2022 CLINICAL DATA:  [id number].  NGT placement. EXAM: PORTABLE CHEST 1 VIEW COMPARISON:  Portable chest 05/01/2022 FINDINGS: 10:04 p.m. NGT has been placed and terminates at the body of the stomach. The cardiomediastinal silhouette and vascular pattern are normal. The upper lung apices are clipped from the exposure. There is increased left infrahilar opacity today which could be due to atelectasis, pneumonia or aspiration. Remaining lungs are clear apart from scattered calcified granulomas. No pleural effusion is seen or measurable pneumothorax. Reverse S shaped thoracic scoliosis is again shown. IMPRESSION: 1. NGT terminates at the body of the stomach. 2. Increased left infrahilar opacity could be due to atelectasis, pneumonia or aspiration. Attention on follow-up films recommended. Electronically Signed   By: Telford Nab M.D.   On: 05/06/2022 22:21   DG Abd 1 View  Result Date: 05/05/2022 CLINICAL DATA:  Placement of enteric tube EXAM: ABDOMEN - 1 VIEW COMPARISON:  Previous studies including the CT done on 05-Jan-2022 FINDINGS: Enteric tube is not seen in the radiograph. Bowel gas pattern in the upper abdomen is unremarkable. There are no focal infiltrates in the visualized lung fields. There are multiple calcified granulomas in both lungs. IMPRESSION: Enteric tube is not seen in the imaged. Electronically Signed   By: Elmer Picker M.D.   On: 05/05/2022 17:26   DG Abd Portable 1V  Result Date: 05/05/2022 CLINICAL DATA:  Enteric tube placement EXAM: PORTABLE ABDOMEN - 1 VIEW COMPARISON:   None Available. FINDINGS: Tip of enteric tube is seen in the region of fundus of the stomach. Bowel gas pattern in the upper abdomen is unremarkable. There is no definite evidence of pneumoperitoneum in this  semi upright portable study. Lower abdomen is not included in the image. Low position of diaphragms suggests COPD. There are multiple calcified nodules in both lungs suggesting healed granulomas. There is no pleural effusion or pneumothorax. Dextroscoliosis is seen in thoracic spine. IMPRESSION: Tip of enteric tube is seen in the fundus of the stomach. Electronically Signed   By: Elmer Picker M.D.   On: 05/05/2022 17:24   MR BRAIN WO CONTRAST  Result Date: 05/05/2022 CLINICAL DATA:  New onset seizure. EXAM: MRI HEAD WITHOUT CONTRAST TECHNIQUE: Multiplanar, multiecho pulse sequences of the brain and surrounding structures were obtained without intravenous contrast. COMPARISON:  CT head 04/30/2022 FINDINGS: Brain: There is no acute intracranial hemorrhage, extra-axial fluid collection, or acute infarct Parenchymal volume is normal. The ventricles are normal in size. Gray-white differentiation is preserved. There are scattered small foci of FLAIR signal abnormality in the subcortical and periventricular white matter. There is no structural or migration abnormality. The corpus callosum is normally formed. The hippocampi are normal in signal and architecture. Is a single punctate chronic microhemorrhage in the right external capsule, nonspecific. The pituitary and suprasellar region are normal. There is no mass lesion. There is no mass effect or midline shift. Vascular: Normal flow voids. Skull and upper cervical spine: Normal marrow signal. Sinuses/Orbits: There is mucosal thickening with layering fluid in the right maxillary sinus. The globes and orbits are unremarkable. Other: There is mild right occipital scalp swelling, similar to the prior CT. There is a small right mastoid effusion. IMPRESSION: 1.  No acute intracranial pathology or epileptogenic focus identified. 2. Mild right occipital scalp swelling, similar to the prior CT. 3. Scattered small foci of FLAIR signal abnormality in the supratentorial white matter are nonspecific but may reflect sequela of chronic small-vessel ischemic change, overall mild but accelerated for age. 4. Layering fluid in the right maxillary sinus which may reflect acute sinusitis in the correct clinical setting. Electronically Signed   By: Valetta Mole M.D.   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 Level of alertness: Awake, asleep AEDs during EEG study: LEV, LCM Technical aspects: This EEG study was done with scalp electrodes positioned according to the 10-20 International system of electrode placement. Electrical activity was reviewed with band pass filter of 1-'70Hz'$ , sensitivity of 7 uV/mm, display speed of 18m/sec with a '60Hz'$  notched filter applied as appropriate. EEG data were recorded continuously and digitally stored.  Video monitoring was available and reviewed as appropriate. Description: During awake state, no clear posterior dominant rhythm was seen. Sleep was characterized by vertex waves, sleep spindles (12 to 14 Hz), maximal frontocentral region.  EEG showed continuous generalized and lateralized left hemisphere 3 to 5 Hz theta-delta slowing as well as 12 to 15 Hz beta activity seen predominantly in right hemisphere.  Sharp waves were noted in left hemisphere, maximal left posterior quadrant which appeared quasiperiodic at 0.5 to 1 Hz. Hyperventilation and photic stimulation were not performed.   ABNORMALITY -  Sharp wave, left hemisphere, maximal left posterior quadrant - Continuous slow, generalized and lateralized left hemisphere IMPRESSION: This study showed evidence of epileptogenicity and cortical dysfunction arising from left hemisphere, maximal left posterior quadrant likely due to underlying structural abnormality.  Additionally there is moderate to severe diffuse encephalopathy, nonspecific to etiology.  No definite seizures were seen during the study. 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 The following electrodes were missing or displaced: none. Background: There is a poorly sustained posterior dominant rhythm that is better seen on the right than left and she has a frequency of 10 Hz.  There are lateralized periodic discharges with phase reversal at F7 and theta wave morphology which wax and wane without definite evolution throughout the recording.  There is also significant left hemispheric focal slow activity. Photic stimulation: Physiologic driving is now performed EEG Abnormalities: 1) lateralized periodic discharges with theta wave morphology 2) left hemispheric slow activity Clinical Interpretation: This EEG recorded the pattern on the ictal-interictal continuum suggestive of a highly irritable area in the left hemisphere without definite seizure.  Recommend continuous EEG monitoring. Roland Rack, MD [facility] [phone] If 7pm- 7am, please page neurology on call as listed in AMION.'  DG Chest Port 1 View  Result Date: 05/01/2022 CLINICAL DATA:  Seizure EXAM: PORTABLE CHEST 1 VIEW  COMPARISON:  04/29/2022 FINDINGS: Unchanged cardiac and mediastinal contours. Chronic scattered calcified pulmonary nodules, likely sequela of prior granulomatous disease. No new focal pulmonary opacity. No pleural effusion or pneumothorax. Scoliosis. No acute osseous abnormality. IMPRESSION: No acute cardiopulmonary process. Electronically Signed   By: Merilyn Baba M.D.   On: 05/01/2022 00:18   CT Head Wo Contrast  Result Date: 04/30/2022 CLINICAL DATA:  Head trauma, abnormal mental status (Age 56-64y) EXAM: CT HEAD WITHOUT CONTRAST TECHNIQUE: Contiguous axial images were obtained from the base of the skull through the vertex without intravenous contrast. RADIATION DOSE REDUCTION: This exam was performed according to the departmental dose-optimization program which includes automated exposure control, adjustment of the mA and/or kV according to patient size and/or use of iterative reconstruction technique. COMPARISON:  CT head 09/11/2018 FINDINGS: Brain: No evidence of large-territorial acute infarction. No parenchymal hemorrhage. No mass lesion. No extra-axial collection. No mass effect or midline shift. No hydrocephalus. Basilar cisterns are patent. Vascular: No hyperdense vessel. Skull: No acute fracture or focal lesion. Sinuses/Orbits: Right maxillary sinus mucosal thickening. Otherwise paranasal sinuses and mastoid air cells are clear. The orbits are unremarkable. Other: None. IMPRESSION: No acute intracranial abnormality. Electronically Signed   By: Iven Finn M.D.   On: 04/30/2022 21:42   DG Chest 2 View  Result Date: 04/29/2022 CLINICAL DATA:  cough EXAM: CHEST - 2 VIEW COMPARISON:  Chest x-ray 12/30/2021 FINDINGS: The heart and mediastinal contours are within normal limits. Chronic multiple scattered calcified pulmonary nodules likely sequelae of prior granulomatous disease. No focal consolidation. No pulmonary edema. No pleural effusion. No pneumothorax. No acute osseous abnormality.  Dextroscoliosis of the midthoracic spine. IMPRESSION: No active cardiopulmonary disease. Electronically Signed   By: Iven Finn M.D.   On: 04/29/2022 20:20    Labs:  CBC: Recent Labs    05/06/22 0711 05/08/22 0622 05/10/22 0634 05/13/22 0450  WBC 8.6 11.5* 4.5 5.8  HGB 13.4 12.5 11.5* 11.2*  HCT 39.6 36.9 33.8* 33.7*  PLT 216 212 284 356  COAGS: Recent Labs    12/30/21 2244  INR 0.9  APTT 31    BMP: Recent Labs    05/07/22 0448 05/08/22 0622 05/10/22 0634 05/13/22 0450 05/15/22 0603  NA 142 139 138 133*  --   K 3.6 4.2 3.8 3.6  --   CL 110 107 106 96*  --   CO2 '24 22 27 27  '$ --   GLUCOSE 111* 104* 104* 83  --   BUN 5* '9 8 10  '$ --   CALCIUM 8.6* 8.5* 8.4* 8.5*  --   CREATININE 0.93 0.86 0.86 0.82 0.68  GFRNONAA >60 >60 >60 >60 >60    LIVER FUNCTION TESTS: Recent Labs    05/01/22 1337 05/02/22 0728 05/03/22 0359 05/05/22 0313  BILITOT 1.0 1.2 0.9 0.9  AST '29 23 26 26  '$ ALT '14 13 13 14  '$ ALKPHOS 95 87 93 82  PROT 5.5* 5.7* 6.1* 5.8*  ALBUMIN 2.7* 2.5* 2.8* 2.6*    Assessment and Plan:  Ms. 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  Her appetite is greatly reduced and diagnostic FEES revealed aspiration of trialed consistencies.  IR consulted for percutaneous gastrostomy tube.  Ms. Farnell is familiar with G-tubes as she has used them in years prior.  Tube feeds have been held since yesterday and she is appropriately NPO.  Vitals, imaging, history, meds and labs reviewed.  Daughter, Crystal Spence, called by patient request to inform of upcoming placement.  Last dose lovenox 05/13/22.  Patient hopeful for placement today.  Will proceed as IR schedule permits.  Risks and benefits image guided gastrostomy tube placement was discussed with the patient including, but not limited to the need for a barium enema during the procedure, bleeding, infection, peritonitis and/or damage to adjacent  structures.  All of the patient's questions were answered, patient is agreeable to proceed.  Consent signed and in chart.   Thank you for this interesting consult.  I greatly enjoyed meeting Crystal Spence and look forward to participating in their care.  A copy of this report was sent to the requesting provider on this date.  Electronically Signed: Pasty Spillers, PA 05/15/2022, 10:26 AM  I spent a total of 40 Minutes  in face to face in clinical consultation, greater than 50% of which was counseling/coordinating care for gtube placement

## 2022-05-15 NOTE — Sedation Documentation (Signed)
Awaiting arrival of transporter to transport pt to floor at this time.

## 2022-05-15 NOTE — Sedation Documentation (Signed)
Transport arrived to [employer] department to transport pt to floor.

## 2022-05-15 NOTE — Progress Notes (Signed)
PROGRESS NOTE    Crystal Spence  ZDG:[id number] DOB: [birth year] DOA: 04/30/2022 PCP: Trey Sailors, PA   Brief Narrative:  47 y.o. 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  CT of the head was negative for acute abnormality.  She was treated with IV Ativan and Keppra.  Neurology was consulted and patient was transferred to [facility] and started on EEG.  Assessment & Plan:  Status epilepticus, resolved Acute metabolic encephalopathy, POA, resolved Patient was seen by neurology.  Currently on Vimpat, phenobarbital and Keppra. Continue to wean phenobarbital per neurology recommendations Patient off of LTM.  MRI brain without any acute findings or lesions. Transition to p.o. antiepileptics once PEG tube has been placed.   Follow-up with neurology in 3 months as scheduled Mentation has significantly improved.  Aspiration pneumonia/Acute respiratory failure with hypoxia, resolving Transient fever on the 25th without recurrence, weaning oxygen currently on room air  Chest x-ray concerning for pneumonia, likely aspiration given below Completed Unasyn course Influenza positive at admission, unlikely etiology for symptoms given timing (diagnosed a week prior to admission) as well as aspiration due to dysphagia is much more likely and consistent with imaging. Stable currently.  Oropharyngeal dysphagia, ongoing -Speech therapy following -dysphagia likely secondary to chronic tissue changes in the setting of previous radiation treatment -Unlikely to recover acutely - discussed PEG tube placement with patient, she is agreeable.  She reports having multiple PEG tube placed in the past during laryngeal cancer treatment and is well  aware of the indications, risks and benefits. -NG tube feeds ongoing until PEG tube can be placed Interventional radiology consulted.  Plan is for PEG tube possibly later today.  Influenza A/bronchitis, resolving -Diagnosed on 04/29/2022 -Discontinue precautions -No indication for Tamiflu at this time  Ambulatory dysfunction, improving PT is following.  Plan is for discharge to skilled nursing facility.  Essential hypertension Continue diltiazem.  Blood pressure noted to be borderline low.  Will decrease the dose of Cardizem.  Leukocytosis Likely reactive.  Resolved  Hypophosphatemia and hypomagnesemia Supplemented.  Hypothyroidism Continue levothyroxine  Hypokalemia Continue to follow  History of laryngeal cancer, resolved Cancer free for years, discharged from oncology follow-up in 2020    Normocytic anemia No evidence of overt blood loss.  Continue to monitor.  Weekly.  DVT prophylaxis: Lovenox Code Status: Full Family Communication: At bedside Disposition Plan: Plan is for discharge to SNF.  Waiting on PEG tube placement.    Status is: Inpatient Remains inpatient appropriate because: Status epilepticus  Consultants: Neurology Procedures: LTM EEG Antimicrobials: None  Subjective: Denies any complaints.  Looking forward to PEG tube placement.  Objective: Vitals:   05/14/22 1555 05/14/22 2020 05/15/22 0345 05/15/22 0750  BP: 113/81 130/88 114/81 107/77  Pulse: 85 82 88 90  Resp: '14 18 18 18  '$ Temp: 98.7 F (37.1 C) 98 F (36.7 C) 98.7 F (37.1 C) 98.7 F (37.1 C)  TempSrc: Oral Oral Oral Oral  SpO2: 96% 96% 96% 95%  Weight:      Height:        Intake/Output Summary (Last 24 hours) at 05/15/2022 1116 Last data filed at 05/15/2022 0000 Gross per 24 hour  Intake 795 ml  Output 500 ml  Net 295 ml    [employer]  05/09/22 0500 05/10/22 0540 05/12/22 0600  Weight: 41.1 kg 42.5 kg 48.2 kg    Examination:  General appearance: Awake alert.  In no  distress Resp: Clear to auscultation bilaterally.  Normal effort Cardio: S1-S2 is normal regular.  No S3-S4.  No rubs murmurs or bruit GI: Abdomen is soft.  Nontender nondistended.  Bowel sounds are present normal.  No masses organomegaly   Data Reviewed: I have personally reviewed following labs and imaging studies  CBC: Recent Labs  Lab 05/10/22 0634 05/13/22 0450  WBC 4.5 5.8  HGB 11.5* 11.2*  HCT 33.8* 33.7*  MCV 95.8 96.8  PLT 284 782    Basic Metabolic Panel: Recent Labs  Lab 05/08/22 1817 05/10/22 0634 05/13/22 0450 05/15/22 0603  NA  --  138 133*  --   K  --  3.8 3.6  --   CL  --  106 96*  --   CO2  --  27 27  --   GLUCOSE  --  104* 83  --   BUN  --  8 10  --   CREATININE  --  0.86 0.82 0.68  CALCIUM  --  8.4* 8.5*  --   MG 1.7  --   --   --   PHOS 3.7  --   --   --     GFR: Estimated Creatinine Clearance: 64.9 mL/min (by C-G formula based on SCr of 0.68 mg/dL).   CBG: Recent Labs  Lab 05/14/22 0808 05/14/22 1137 05/14/22 1551 05/14/22 2300 05/15/22 0620  GLUCAP 94 96 106* 94 88      Recent Results (from the past 240 hour(s))  Culture, blood (Routine X 2) w Reflex to ID Panel     Status: None   Collection Time: 05/07/22  9:17 AM   Specimen: BLOOD  Result Value Ref Range Status   Specimen Description BLOOD LEFT ANTECUBITAL  Final   Special Requests   Final    BOTTLES DRAWN AEROBIC AND ANAEROBIC Blood Culture adequate volume   Culture   Final    NO GROWTH 5 DAYS Performed at [facility], [place] [address], [place], [place] [address]    Report Status 05/12/2022 FINAL  Final  Culture, blood (Routine X 2) w Reflex to ID Panel     Status: None   Collection Time: 05/07/22  9:17 AM   Specimen: BLOOD LEFT HAND  Result Value Ref Range Status   Specimen Description BLOOD LEFT HAND  Final   Special Requests   Final    BOTTLES DRAWN AEROBIC AND ANAEROBIC Blood Culture results may not be optimal due to an inadequate volume of blood received in  culture bottles   Culture   Final    NO GROWTH 5 DAYS Performed at [facility], [place] [address], [place], [place] [address]    Report Status 05/12/2022 FINAL  Final    Radiology Studies: No results found.  Scheduled Meds:  diltiazem  30 mg Per Tube Q8H   enoxaparin (LOVENOX) injection  30 mg Subcutaneous QHS   free water  100 mL Per Tube Q6H   levothyroxine  75 mcg Per Tube QAC breakfast   PHENObarbital  32.5 mg Intravenous Daily   polyethylene glycol  17 g Oral BID   sodium chloride flush  10-40 mL Intracatheter Q12H   sodium chloride flush  3 mL Intravenous Q12H   Continuous Infusions:  [START ON 05/16/2022] feeding supplement (OSMOLITE 1.2 CAL)  lacosamide (VIMPAT) IV 100 mg (05/15/22 1103)   levETIRAcetam 1,000 mg (05/15/22 0936)    Bonnielee Haff,  [facility] 05/15/2022, 11:16 AM

## 2022-05-15 NOTE — Sedation Documentation (Signed)
Attempted to call report to floor and was advised that RN was unavailable to take report at this time and would call back when available.  Awaiting CB at this time.  Pt placed in teletracking for transport as well and awaiting arrival of transporter as well at this time.

## 2022-05-16 DIAGNOSIS — R1312 Dysphagia, oropharyngeal phase: Secondary | ICD-10-CM | POA: Diagnosis not present

## 2022-05-16 DIAGNOSIS — G40909 Epilepsy, unspecified, not intractable, without status epilepticus: Secondary | ICD-10-CM | POA: Diagnosis not present

## 2022-05-16 LAB — BASIC METABOLIC PANEL
Anion gap: 8 (ref 5–15)
BUN: 10 mg/dL (ref 6–20)
CO2: 21 mmol/L — ABNORMAL LOW (ref 22–32)
Calcium: 7.9 mg/dL — ABNORMAL LOW (ref 8.9–10.3)
Chloride: 101 mmol/L (ref 98–111)
Creatinine, Ser: 0.69 mg/dL (ref 0.44–1.00)
GFR, Estimated: >60 mL/min (ref >60)
Glucose, Bld: 76 mg/dL (ref 70–99)
Potassium: 3.7 mmol/L (ref 3.5–5.1)
Sodium: 130 mmol/L — ABNORMAL LOW (ref 135–145)

## 2022-05-16 LAB — CBC
HCT: 31.2 % — ABNORMAL LOW (ref 36.0–46.0)
Hemoglobin: 10.6 g/dL — ABNORMAL LOW (ref 12.0–15.0)
MCH: 32.8 pg (ref 26.0–34.0)
MCHC: 34 g/dL (ref 30.0–36.0)
MCV: 96.6 fL (ref 80.0–100.0)
Platelets: 395 10*3/uL (ref 150–400)
RBC: 3.23 MIL/uL — ABNORMAL LOW (ref 3.87–5.11)
RDW: 17.7 % — ABNORMAL HIGH (ref 11.5–15.5)
WBC: 8.1 10*3/uL (ref 4.0–10.5)
nRBC: 0 % (ref 0.0–0.2)

## 2022-05-16 LAB — GLUCOSE, CAPILLARY
Glucose-Capillary: 69 mg/dL — ABNORMAL LOW (ref 70–99)
Glucose-Capillary: 160 mg/dL — ABNORMAL HIGH (ref 70–99)

## 2022-05-16 LAB — MAGNESIUM: Magnesium: 1.7 mg/dL (ref 1.7–2.4)

## 2022-05-16 MED ORDER — LEVETIRACETAM 500 MG PO TABS
1000.0000 mg | ORAL_TABLET | Freq: Two times a day (BID) | ORAL | Status: DC
  Administered 2022-05-16 – 2022-05-17 (×3): 1000 mg
  Filled 2022-05-16 (×3): qty 2

## 2022-05-16 MED ORDER — DEXTROSE 50 % IV SOLN
12.5000 g | INTRAVENOUS | Status: AC
  Administered 2022-05-16: 12.5 g via INTRAVENOUS
  Filled 2022-05-16: qty 50

## 2022-05-16 MED ORDER — OSMOLITE 1.2 CAL PO LIQD
355.0000 mL | Freq: Three times a day (TID) | ORAL | Status: DC
  Administered 2022-05-17: 355 mL

## 2022-05-16 MED ORDER — POTASSIUM CHLORIDE 20 MEQ PO PACK
40.0000 meq | PACK | Freq: Once | ORAL | Status: AC
  Administered 2022-05-16: 40 meq
  Filled 2022-05-16: qty 2

## 2022-05-16 MED ORDER — OSMOLITE 1.2 CAL PO LIQD
237.0000 mL | Freq: Once | ORAL | Status: AC
  Administered 2022-05-16: 237 mL
  Filled 2022-05-16: qty 237

## 2022-05-16 MED ORDER — LACOSAMIDE 50 MG PO TABS
100.0000 mg | ORAL_TABLET | Freq: Two times a day (BID) | ORAL | Status: DC
  Administered 2022-05-16 – 2022-05-17 (×2): 100 mg
  Filled 2022-05-16 (×3): qty 2

## 2022-05-16 MED ORDER — POLYETHYLENE GLYCOL 3350 17 G PO PACK
17.0000 g | PACK | Freq: Two times a day (BID) | ORAL | Status: DC
  Administered 2022-05-16 – 2022-05-17 (×3): 17 g
  Filled 2022-05-16 (×3): qty 1

## 2022-05-16 MED ORDER — OSMOLITE 1.2 CAL PO LIQD: 1000.0000 mL | ORAL | Status: DC

## 2022-05-16 MED ORDER — OSMOLITE 1.2 CAL PO LIQD
120.0000 mL | Freq: Once | ORAL | Status: AC
  Administered 2022-05-16: 120 mL

## 2022-05-16 MED ORDER — MAGNESIUM SULFATE 2 GM/50ML IV SOLN
2.0000 g | Freq: Once | INTRAVENOUS | Status: AC
  Administered 2022-05-16: 2 g via INTRAVENOUS
  Filled 2022-05-16: qty 50

## 2022-05-16 MED ORDER — FREE WATER
100.0000 mL | Freq: Four times a day (QID) | Status: DC
  Administered 2022-05-16 – 2022-05-17 (×3): 100 mL

## 2022-05-16 MED ORDER — PHENOBARBITAL 32.4 MG PO TABS
32.4000 mg | ORAL_TABLET | Freq: Every day | ORAL | Status: DC
  Administered 2022-05-16: 32.4 mg
  Filled 2022-05-16: qty 1

## 2022-05-16 NOTE — Progress Notes (Signed)
Hypoglycemic Event  CBG: 69  Treatment: D50 25 mL (12.5 gm)  Symptoms: None  Follow-up CBG: Time:1151  CBG Result:160  Possible Reasons for Event: Inadequate meal intake  Comments/MD notified:will start feedings at [place]

## 2022-05-16 NOTE — Progress Notes (Signed)
Initial Nutrition Assessment  DOCUMENTATION CODES:  Underweight, Severe malnutrition in context of chronic illness  INTERVENTION:  Once PEG cleared for use, transition to bolus feeds as pt is medically stable for discharge. Goal regimen is as follows: Osmolite 1.2, 325m - 4x/d (6 cartons total per day) 547mof free water before and after each bolus feed This will provide 1710kcal, 79g of protein, and 157070mf free water Start with 120m41mhen increase by 1/2 carton until tolerating 1.5 cartons at each feed. MVI with minerals daily  NUTRITION DIAGNOSIS:  Severe Malnutrition related to chronic illness (hx of throat cancer) as evidenced by severe fat depletion, severe muscle depletion. - remains applicable  GOAL:  Patient will meet greater than or equal to 90% of their needs - progressing, met with TF at goal  MONITOR:  TF tolerance, Diet advancement, Labs, Weight trends, I & O's  REASON FOR ASSESSMENT:  Consult Enteral/tube feeding initiation and management  ASSESSMENT:  46 y49. 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 She tested positive for influenza A on 12/18. She presented to the ED with seizures. CT head was negative for acute abnormality. Neurology was consulted and EEG was started.  12/21, 12/22, 12/23, 12/24 - SLP evaluation, recommend NPO 12/24 - NGT placed and TF initiated 12/27 - Cortrak (gastric) 1/3 - PEG placed  Pt had PEG placed and is clear for use at 1600. Met with pt in room and discussed her transition to bolus feeds as she is medically stable for discharge once PEG is functional. Pt states she is hungry, has no questions at this time about regimen. Has had PEG in the past while undergoing cancer treatment.   Discussed feeding regimen with RN.  Nutritionally Relevant Medications: Scheduled Meds:  free water  100 mL Per Tube Q6H   polyethylene glycol  17 g Per Tube BID    Continuous Infusions:  feeding supplement (OSMOLITE 1.2 CAL)     PRN Meds: diphenhydrAMINE, ondansetron, senna-docusate  Labs Reviewed: Na 130 CBG ranges from 69-160 mg/dL over the last 24 hours  NUTRITION - FOCUSED PHYSICAL EXAM: Flowsheet Row Most Recent Value  Orbital Region Severe depletion  Upper Arm Region Severe depletion  Thoracic and Lumbar Region Severe depletion  Buccal Region Severe depletion  Temple Region Severe depletion  Clavicle Bone Region Severe depletion  Clavicle and Acromion Bone Region Severe depletion  Scapular Bone Region Severe depletion  Dorsal Hand Severe depletion  Patellar Region Severe depletion  Anterior Thigh Region Severe depletion  Posterior Calf Region Severe depletion  Edema (RD Assessment) None  Hair Reviewed  Eyes Reviewed  Mouth Reviewed  Skin Reviewed  Nails Reviewed    Diet Order:   Diet Order             Diet NPO time specified Except for: Sips with Meds, Ice Chips  Diet effective now                   EDUCATION NEEDS:  No education needs have been identified at this time  Skin:  Skin Assessment: Reviewed RN Assessment  Last BM:  1/3 - type 6  Height:  Ht Readings from Last 1 Encounters:  05/02/22 _0  (1.549 m)    Weight:  Wt Readings from Last 1 Encounters:  05/16/22 42.6 kg    Ideal Body Weight:  47.7 kg  BMI:  Body mass index is 17.75 kg/m.  Estimated Nutritional Needs:  Kcal:  1500-1700 kcal/d Protein:  75-90g/d Fluid:  >/= 1.6 L/day    Ranell Patrick, RD, LDN Clinical Dietitian RD pager # available in [facility]  After hours/weekend pager # available in [facility]

## 2022-05-16 NOTE — Progress Notes (Signed)
PROGRESS NOTE    Crystal Spence  FHL:[id number] DOB: 03/17/1976 DOA: 04/30/2022 PCP: Trey Sailors, PA   Brief Narrative:  47 y.o. 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  CT of the head was negative for acute abnormality.  She was treated with IV Ativan and Keppra.  Neurology was consulted and patient was transferred to [facility] and started on EEG.  Assessment & Plan:  Status epilepticus, resolved Acute metabolic encephalopathy, POA, resolved Patient was seen by neurology.  Currently on Vimpat, phenobarbital and Keppra. Continue to wean phenobarbital per neurology recommendations.  Patient off of LTM.  MRI brain without any acute findings or lesions. PEG tube was placed yesterday.  Lacosamide and Keppra has been changed over to enteral route. Phenobarbital to be discontinued soon. Follow-up with neurology in 3 months as scheduled Mentation has significantly improved.  Aspiration pneumonia/Acute respiratory failure with hypoxia, resolving Transient fever on the 25th without recurrence, weaning oxygen currently on room air  Chest x-ray concerning for pneumonia, likely aspiration given below Completed Unasyn course Influenza positive at admission, unlikely etiology for symptoms given timing (diagnosed a week prior to admission) as well as aspiration due to dysphagia is much more likely and consistent with imaging. Stable currently.  Oropharyngeal dysphagia, ongoing -Speech therapy following -dysphagia likely secondary to chronic tissue changes in the setting of previous radiation treatment -Unlikely to recover acutely - discussed PEG tube placement with patient, she is agreeable.  She reports having multiple PEG  tube placed in the past during laryngeal cancer treatment and is well aware of the indications, risks and benefits. She was placed on 05/15/2022.  Will reinitiate tube feedings from this afternoon.  Influenza A/bronchitis, resolving -Diagnosed on 04/29/2022 -Discontinue precautions -No indication for Tamiflu at this time  Ambulatory dysfunction, improving PT is following.  Plan is for discharge to skilled nursing facility.  Essential hypertension Dose of her diltiazem was decreased yesterday due to low blood pressures.  Blood pressures have stabilized.  Continue to monitor.  Leukocytosis Likely reactive.  Resolved  Hypophosphatemia and hypomagnesemia Supplemented.  Hypothyroidism Continue levothyroxine  Hypokalemia/hyponatremia Continue to follow  History of laryngeal cancer, resolved Cancer free for years, discharged from oncology follow-up in 2020    Normocytic anemia No evidence of overt blood loss.  Continue to monitor.    DVT prophylaxis: Lovenox Code Status: Full Family Communication: At bedside Disposition Plan: PEG tube has been placed.  Anticipate discharge tomorrow to SNF.  Status is: Inpatient Remains inpatient appropriate because: Status epilepticus  Consultants: Neurology Procedures: LTM EEG Antimicrobials: None  Subjective: Patient denies any complaints.  Denies any abdominal pain after PEG tube placement.   Objective: Vitals:   05/15/22 2343 05/16/22 0323 05/16/22 0414 05/16/22 0743  BP: 105/79 119/85  (!) 136/97  Pulse: 91 86  87  Resp: '16 18  18  '$ Temp: 98 F (36.7 C) 98.3 F (36.8 C)  98.7 F (37.1 C)  TempSrc: Oral Oral  Oral  SpO2: 95% 97%  97%  Weight:   42.6 kg   Height:       No intake or output data in the 24 hours ending 05/16/22 1109  Filed Weights   05/10/22 0540 05/12/22 0600 05/16/22 0414  Weight: 42.5 kg 48.2 kg 42.6 kg  Examination:  General appearance: Awake alert.  In no distress Resp: Clear to auscultation  bilaterally.  Normal effort Cardio: S1-S2 is normal regular.  No S3-S4.  No rubs murmurs or bruit GI: Abdomen is soft.  Nontender nondistended.  Bowel sounds are present normal.  No masses organomegaly.  PEG tube is noted. Extremities: No edema.  Moving all 4 extremities   Data Reviewed: I have personally reviewed following labs and imaging studies  CBC: Recent Labs  Lab 05/10/22 0634 05/13/22 0450 05/16/22 0319  WBC 4.5 5.8 8.1  HGB 11.5* 11.2* 10.6*  HCT 33.8* 33.7* 31.2*  MCV 95.8 96.8 96.6  PLT 284 356 371    Basic Metabolic Panel: Recent Labs  Lab 05/10/22 0634 05/13/22 0450 05/15/22 0603 05/16/22 0319  NA 138 133*  --  130*  K 3.8 3.6  --  3.7  CL 106 96*  --  101  CO2 27 27  --  21*  GLUCOSE 104* 83  --  76  BUN 8 10  --  10  CREATININE 0.86 0.82 0.68 0.69  CALCIUM 8.4* 8.5*  --  7.9*  MG  --   --   --  1.7    GFR: Estimated Creatinine Clearance: 57.8 mL/min (by C-G formula based on SCr of 0.69 mg/dL).   CBG: Recent Labs  Lab 05/14/22 2300 05/15/22 0620 05/15/22 1215 05/15/22 1827 05/15/22 2342  GLUCAP 94 88 81 73 85      Recent Results (from the past 240 hour(s))  Culture, blood (Routine X 2) w Reflex to ID Panel     Status: None   Collection Time: 05/07/22  9:17 AM   Specimen: BLOOD  Result Value Ref Range Status   Specimen Description BLOOD LEFT ANTECUBITAL  Final   Special Requests   Final    BOTTLES DRAWN AEROBIC AND ANAEROBIC Blood Culture adequate volume   Culture   Final    NO GROWTH 5 DAYS Performed at [facility], [address] [place], [place] [address]    Report Status 05/12/2022 FINAL  Final  Culture, blood (Routine X 2) w Reflex to ID Panel     Status: None   Collection Time: 05/07/22  9:17 AM   Specimen: BLOOD LEFT HAND  Result Value Ref Range Status   Specimen Description BLOOD LEFT HAND  Final   Special Requests   Final    BOTTLES DRAWN AEROBIC AND ANAEROBIC Blood Culture results may not be optimal due to an  inadequate volume of blood received in culture bottles   Culture   Final    NO GROWTH 5 DAYS Performed at [facility], [place] [address] [place] [address], [place], [place] [address]    Report Status 05/12/2022 FINAL  Final    Radiology Studies: IR GASTROSTOMY TUBE MOD SED  Result Date: 05/15/2022 INDICATION: History of head and neck cancer, now with worsening dysphagia and aspiration. Please perform percutaneous gastrostomy tube placement for enteric nutrition supplementation purposes. Note, patient with remote history of previous gastrostomy tube. EXAM: PULL TROUGH GASTROSTOMY TUBE PLACEMENT COMPARISON:  CT abdomen pelvis-12/31/2021 MEDICATIONS: Ancef 2 gm IV; Antibiotics were administered within 1 hour of the procedure. Glucagon 1 mg IV CONTRAST:  20 cc Omnipaque 300 administered into the gastric lumen. ANESTHESIA/SEDATION: Moderate (conscious) sedation was employed during this procedure. A total of Versed 1 mg and Fentanyl 50 mcg was administered intravenously. Moderate Sedation Time: 15 minutes. The patient's level of consciousness and vital signs were monitored continuously by radiology nursing  throughout the procedure under my direct supervision. FLUOROSCOPY TIME:  2 minutes, 18 seconds (7 mGy) COMPLICATIONS: None immediate. PROCEDURE: Informed written consent was obtained from the patient following explanation of the procedure, risks, benefits and alternatives. A time out was performed prior to the initiation of the procedure. Ultrasound scanning was performed to demarcate the edge of the left lobe of the liver. Maximal barrier sterile technique utilized including caps, mask, sterile gowns, sterile gloves, large sterile drape, hand hygiene and Betadine prep. The left upper quadrant was sterilely prepped and draped. An oral gastric catheter was inserted into the stomach under fluoroscopy. The existing nasogastric feeding tube was removed. The left costal margin and air opacified transverse colon were identified  and avoided. Air was injected into the stomach for insufflation and visualization under fluoroscopy. Under sterile conditions a 17 gauge trocar needle was utilized to access the stomach percutaneously beneath the left subcostal margin after the overlying soft tissues were anesthetized with 1% Lidocaine with epinephrine. Needle position was confirmed within the stomach with aspiration of air and injection of small amount of contrast. A single T tack was deployed for gastropexy. Over an Amplatz guide wire, a 9-French sheath was inserted into the stomach. A snare device was utilized to capture the oral gastric catheter. The snare device was pulled retrograde from the stomach up the esophagus and out the oropharynx. The 20-French pull-through gastrostomy was connected to the snare device and pulled antegrade through the oropharynx down the esophagus into the stomach and then through the percutaneous tract external to the patient. The gastrostomy was assembled externally. Contrast injection confirms appropriate positioning within the stomach. Several spot radiographic images were obtained in various obliquities for documentation. Dressings were applied. The patient tolerated procedure well without immediate post procedural complication. FINDINGS: After successful fluoroscopic guided placement, the gastrostomy tube is appropriately positioned with internal disc positioned against the inner ventral wall of the gastric lumen. IMPRESSION: Successful fluoroscopic insertion of a 20-French pull-through gastrostomy tube. The gastrostomy may be used immediately for medication administration and in 24 hrs for the initiation of feeds. Electronically Signed   By: Sandi Mariscal M.D.   On: 05/15/2022 16:25    Scheduled Meds:  diltiazem  30 mg Per Tube Q12H   enoxaparin (LOVENOX) injection  30 mg Subcutaneous Q24H   free water  100 mL Per Tube Q6H   lacosamide  100 mg Per Tube BID   levETIRAcetam  1,000 mg Per Tube BID    levothyroxine  75 mcg Per Tube QAC breakfast   phenobarbital  32.4 mg Per Tube Daily   polyethylene glycol  17 g Per Tube BID   sodium chloride flush  10-40 mL Intracatheter Q12H   sodium chloride flush  3 mL Intravenous Q12H   Continuous Infusions:  feeding supplement (OSMOLITE 1.2 CAL)     lacosamide (VIMPAT) IV 100 mg (05/16/22 1042)    Bonnielee Haff,  [facility] 05/16/2022, 11:09 AM

## 2022-05-16 NOTE — TOC Progression Note (Signed)
Transition of Care [facility]) - Progression Note    Patient Details  Name: Crystal Spence MRN: [id number] Date of Birth: 10/19/1975  Transition of Care [facility]) CM/SW Contact  Jinger Neighbors, Maria Antonia Phone Number: 05/16/2022, 9:55 AM  Clinical Narrative:     CSW contacted Debbie at [facility] to make her aware pt can d/c tomorrow.   Expected Discharge Plan: [place] Barriers to Discharge: Continued Medical Work up  Expected Discharge Plan and [place] arrangements for the past 2 months: Hotel/Motel (Janett Billow reports pt has lived at a hotel for the last 3 years)                                       Social Determinants of Health ([place]) Interventions [place]: No Food Insecurity (05/01/2022)  Housing: Low Risk  (05/01/2022)  Transportation Needs: No Transportation Needs (05/01/2022)  Utilities: Not At Risk (05/01/2022)  Financial Resource Strain: Medium Risk (01/17/2018)  Tobacco Use: Medium Risk (05/15/2022)    Readmission Risk Interventions     No data to display

## 2022-05-16 NOTE — Progress Notes (Signed)
Mobility Specialist Progress Note   05/16/22 0945  Mobility  Activity Ambulated with assistance in hallway  Level of Assistance Contact guard assist, steadying assist  Assistive Device Other (Comment) (IV Pole)  Distance Ambulated (ft) 300 ft  Range of Motion/Exercises Active;All extremities  Activity Response Tolerated well   Patient received in supine and agreeable to participate. Was independent for bed mobility and ambulated min guard with steady gait. Returned to room without complaint or incident. Was left in supine with all needs met, call bell in reach.   Crystal Spence, BS EXP Mobility Specialist Please contact via SecureChat or Rehab office at [phone]

## 2022-05-17 DIAGNOSIS — G40901 Epilepsy, unspecified, not intractable, with status epilepticus: Secondary | ICD-10-CM | POA: Diagnosis not present

## 2022-05-17 LAB — GLUCOSE, CAPILLARY
Glucose-Capillary: 92 mg/dL (ref 70–99)
Glucose-Capillary: 98 mg/dL (ref 70–99)

## 2022-05-17 MED ORDER — OSMOLITE 1.2 CAL PO LIQD: 355.0000 mL | Freq: Three times a day (TID) | ORAL | 0 refills | Status: AC

## 2022-05-17 MED ORDER — LEVOTHYROXINE SODIUM 75 MCG PO TABS: 75.0000 ug | ORAL_TABLET | Freq: Every day | ORAL | Status: AC

## 2022-05-17 MED ORDER — FREE WATER: 100.0000 mL | Freq: Four times a day (QID) | Status: AC

## 2022-05-17 MED ORDER — POLYETHYLENE GLYCOL 3350 17 G PO PACK: 17.0000 g | PACK | Freq: Every day | ORAL | 0 refills | Status: DC | PRN

## 2022-05-17 MED ORDER — SENNOSIDES-DOCUSATE SODIUM 8.6-50 MG PO TABS: 2.0000 | ORAL_TABLET | Freq: Every day | ORAL | Status: AC

## 2022-05-17 MED ORDER — LACOSAMIDE 100 MG PO TABS: 100.0000 mg | ORAL_TABLET | Freq: Two times a day (BID) | ORAL | 0 refills | Status: DC

## 2022-05-17 MED ORDER — DILTIAZEM HCL 30 MG PO TABS: 30.0000 mg | ORAL_TABLET | Freq: Two times a day (BID) | ORAL | 11 refills | Status: AC

## 2022-05-17 MED ORDER — LEVETIRACETAM 1000 MG PO TABS: 1000.0000 mg | ORAL_TABLET | Freq: Two times a day (BID) | ORAL | Status: DC

## 2022-05-17 NOTE — Discharge Instructions (Signed)
Seizure precautions: Per [facility] statutes, patients with seizures are not allowed to drive until they have been seizure-free for six months and cleared by a physician    Use caution when using heavy equipment or power tools. Avoid working on ladders or at heights. Take showers instead of baths. Ensure the water temperature is not too high on the home water heater. Do not go swimming alone. Do not lock yourself in a room alone (i.e. bathroom). When caring for infants or small children, sit down when holding, feeding, or changing them to minimize risk of injury to the child in the event you have a seizure. Maintain good sleep hygiene. Avoid alcohol.    If patient has another seizure, call 911 and bring them back to the ED if: A.  The seizure lasts longer than 5 minutes.      B.  The patient doesn't wake shortly after the seizure or has new problems such as difficulty seeing, speaking or moving following the seizure C.  The patient was injured during the seizure D.  The patient has a temperature over 102 F (39C) E.  The patient vomited during the seizure and now is having trouble breathing    During the Seizure   - First, ensure adequate ventilation and place patients on the floor on their left side  Loosen clothing around the neck and ensure the airway is patent. If the patient is clenching the teeth, do not force the mouth open with any object as this can cause severe damage - Remove all items from the surrounding that can be hazardous. The patient may be oblivious to what's happening and may not even know what he or she is doing. If the patient is confused and wandering, either gently guide him/her away and block access to outside areas - Reassure the individual and be comforting - Call 911. In most cases, the seizure ends before EMS arrives. However, there are cases when seizures may last over 3 to 5 minutes. Or the individual may have developed breathing difficulties or severe  injuries. If a pregnant patient or a person with diabetes develops a seizure, it is prudent to call an ambulance. - Finally, if the patient does not regain full consciousness, then call EMS. Most patients will remain confused for about 45 to 90 minutes after a seizure, so you must use judgment in calling for help. - Avoid restraints but make sure the patient is in a bed with padded side rails    After the Seizure (Postictal Stage)   After a seizure, most patients experience confusion, fatigue, muscle pain and/or a headache. Thus, one should permit the individual to sleep. For the next few days, reassurance is essential. Being calm and helping reorient the person is also of importance.   Most seizures are painless and end spontaneously. Seizures are not harmful to others but can lead to complications such as stress on the lungs, brain and the heart. Individuals with prior lung problems may develop labored breathing and respiratory distress.

## 2022-05-17 NOTE — Progress Notes (Signed)
Mobility Specialist: Progress Note   05/17/22 1122  Mobility  Activity Ambulated with assistance in hallway  Level of Assistance Minimal assist, patient does 75% or more  Assistive Device None  Distance Ambulated (ft) 150 ft  Activity Response Tolerated well  Mobility Referral Yes  $Mobility charge 1 Mobility   Pt received in the bed and agreeable to mobility. Mod I with bed mobility and minA to stand. C/o 8/10 abdominal pain during ambulation, otherwise asymptomatic. Pt back to bed after session with call bell and phone in reach. Bed alarm is on.   [place] Jerremy Maione Mobility Specialist Please contact via SecureChat or Rehab office at [phone]

## 2022-05-17 NOTE — TOC Transition Note (Signed)
Transition of Care [facility]) - CM/SW Discharge Note   Patient Details  Name: Crystal Spence MRN: [id number] Date of Birth: [birth year]  [facility]) CM/SW Contact:  Jinger Neighbors, LCSW Phone Number: 05/17/2022, 11:04 AM   Clinical Narrative:     PT going to [facility] via [place]- Call to Report: [phone] Bed: A9 bed 2  Final next level of care: Skilled Nursing Facility Barriers to Discharge: No Barriers Identified   Patient Goals and CMS Choice CMS Medicare.gov Compare Post Acute Care list provided to:: Patient Represenative (must comment) (pt and her dtr) Choice offered to / list presented to : Adult Children, Patient  Discharge Placement                Patient chooses bed at: Other - please specify in the comment section below: [facility]) Patient to be transferred to facility by: [place] Name of family member notified: Boyd Kerbs Daughter   [phone] Patient and family notified of of transfer: 05/17/22  Discharge Plan and Services Additional resources added to the After Visit Summary for                                       Social Determinants of Health (SDOH) Interventions [place]: No Food Insecurity (05/01/2022)  Housing: Low Risk  (05/01/2022)  Transportation Needs: No Transportation Needs (05/01/2022)  Utilities: Not At Risk (05/01/2022)  Financial Resource Strain: Medium Risk (01/17/2018)  Tobacco Use: Medium Risk (05/15/2022)     Readmission Risk Interventions     No data to display

## 2022-05-17 NOTE — Progress Notes (Signed)
Discharged, Report called to [facility], transported on stretcher by [facility].

## 2022-05-17 NOTE — Discharge Summary (Signed)
[facility]  Physician Discharge Summary   Patient ID: WELLS GERDEMAN MRN: [id number] DOB/AGE: [birth year] 47 y.o.  Admit date: 04/30/2022 Discharge date:   05/17/2022   PCP: Trey Sailors, PA  DISCHARGE DIAGNOSES:    Status epilepticus ([place])   Hypokalemia   History of laryngeal cancer   Hypothyroidism (acquired)   Hypertensive urgency   Influenza A   RECOMMENDATIONS FOR OUTPATIENT FOLLOW UP: Ambulatory referral sent to neurology for follow-up in 2 months    Home Health: Going to SNF Equipment/Devices: None  CODE STATUS: Full code  DISCHARGE CONDITION: fair  Diet recommendation: Bolus tube feedings as mentioned under medication list.  INITIAL HISTORY: 47 y.o. female with medical history significant for hypertension, hypothyroidism, seizure disorder, and history of laryngeal cancer who has been cancer free for many years, tested positive for influenza A on 04/30/2019 and was started on Tamiflu in the ED presented with seizures to [place].  CT of the head was negative for acute abnormality.  She was treated with IV Ativan and Keppra.  Neurology was consulted and patient was transferred to [facility] and started on EEG.   Consultations: Neurology Interventional radiology  Procedures: Long-term EEG monitoring PEG tube placement   HOSPITAL COURSE:   Status epilepticus, resolved Acute metabolic encephalopathy, POA, resolved Patient was seen by neurology.  Patient was placed on Vimpat and phenobarbital and Keppra.  Phenobarbital has been weaned off.  Has been seizure-free for the past several days.  Will need follow-up with neurology in 2 months.  Ambulatory referral has been sent.  Mentation has significantly improved and close to baseline.   She however remains physically deconditioned and will benefit from short-term rehab in a skilled nursing facility.   Aspiration pneumonia/Acute respiratory failure with hypoxia,  resolving Transient fever on the 25th without recurrence, weaning oxygen currently on room air  Chest x-ray concerning for pneumonia, likely aspiration given below Completed Unasyn course Influenza positive at admission, unlikely etiology for symptoms given timing (diagnosed a week prior to admission) as well as aspiration due to dysphagia is much more likely and consistent with imaging. Stable currently.   Oropharyngeal dysphagia, ongoing -Speech therapy following -dysphagia likely secondary to chronic tissue changes in the setting of previous radiation treatment -Unlikely to recover acutely - discussed PEG tube placement with patient, she is agreeable.  She reports having multiple PEG tube placed in the past during laryngeal cancer treatment and is well aware of the indications, risks and benefits. PEG was placed on 05/15/2022.  Started on bolus feeding which she is tolerating well.   Influenza A/bronchitis, resolving -Diagnosed on 04/29/2022 -Discontinue precautions -No indication for Tamiflu at this time   Ambulatory dysfunction, improving PT is following.  Plan is for discharge to skilled nursing facility.   Essential hypertension Dose of her diltiazem was decreased yesterday due to low blood pressures.  Blood pressures have stabilized.     Leukocytosis Likely reactive.  Resolved   Hypophosphatemia and hypomagnesemia Supplemented.   Hypothyroidism Continue levothyroxine   Hypokalemia/hyponatremia Continue to follow   History of laryngeal cancer, resolved Cancer free for years, discharged from oncology follow-up in 2020     Normocytic anemia No evidence of overt blood loss.  Continue to monitor.    Severe protein calorie malnutrition Nutrition Problem: Severe Malnutrition Etiology: chronic illness (hx of throat cancer)  Signs/Symptoms: severe fat depletion, severe muscle depletion  Interventions: Refer to RD note for recommendations  Patient is stable.  Okay for  discharge to  SNF.  PERTINENT LABS:  The results of significant diagnostics from this hospitalization (including imaging, microbiology, ancillary and laboratory) are listed below for reference.    Labs:   Basic Metabolic Panel: Recent Labs  Lab 05/13/22 0450 05/15/22 0603 05/16/22 0319  NA 133*  --  130*  K 3.6  --  3.7  CL 96*  --  101  CO2 27  --  21*  GLUCOSE 83  --  76  BUN 10  --  10  CREATININE 0.82 0.68 0.69  CALCIUM 8.5*  --  7.9*  MG  --   --  1.7    CBC: Recent Labs  Lab 05/13/22 0450 05/16/22 0319  WBC 5.8 8.1  HGB 11.2* 10.6*  HCT 33.7* 31.2*  MCV 96.8 96.6  PLT 356 395     CBG: Recent Labs  Lab 05/15/22 2342 05/16/22 1115 05/16/22 1151 05/17/22 0002 05/17/22 0759  GLUCAP 85 69* 160* 98 92     IMAGING STUDIES IR GASTROSTOMY TUBE MOD SED  Result Date: 05/15/2022 INDICATION: History of head and neck cancer, now with worsening dysphagia and aspiration. Please perform percutaneous gastrostomy tube placement for enteric nutrition supplementation purposes. Note, patient with remote history of previous gastrostomy tube. EXAM: PULL TROUGH GASTROSTOMY TUBE PLACEMENT COMPARISON:  CT abdomen pelvis-12/31/2021 MEDICATIONS: Ancef 2 gm IV; Antibiotics were administered within 1 hour of the procedure. Glucagon 1 mg IV CONTRAST:  20 cc Omnipaque 300 administered into the gastric lumen. ANESTHESIA/SEDATION: Moderate (conscious) sedation was employed during this procedure. A total of Versed 1 mg and Fentanyl 50 mcg was administered intravenously. Moderate Sedation Time: 15 minutes. The patient's level of consciousness and vital signs were monitored continuously by radiology nursing throughout the procedure under my direct supervision. FLUOROSCOPY TIME:  2 minutes, 18 seconds (7 mGy) COMPLICATIONS: None immediate. PROCEDURE: Informed written consent was obtained from the patient following explanation of the procedure, risks, benefits and alternatives. A time out was  performed prior to the initiation of the procedure. Ultrasound scanning was performed to demarcate the edge of the left lobe of the liver. Maximal barrier sterile technique utilized including caps, mask, sterile gowns, sterile gloves, large sterile drape, hand hygiene and Betadine prep. The left upper quadrant was sterilely prepped and draped. An oral gastric catheter was inserted into the stomach under fluoroscopy. The existing nasogastric feeding tube was removed. The left costal margin and air opacified transverse colon were identified and avoided. Air was injected into the stomach for insufflation and visualization under fluoroscopy. Under sterile conditions a 17 gauge trocar needle was utilized to access the stomach percutaneously beneath the left subcostal margin after the overlying soft tissues were anesthetized with 1% Lidocaine with epinephrine. Needle position was confirmed within the stomach with aspiration of air and injection of small amount of contrast. A single T tack was deployed for gastropexy. Over an Amplatz guide wire, a 9-French sheath was inserted into the stomach. A snare device was utilized to capture the oral gastric catheter. The snare device was pulled retrograde from the stomach up the esophagus and out the oropharynx. The 20-French pull-through gastrostomy was connected to the snare device and pulled antegrade through the oropharynx down the esophagus into the stomach and then through the percutaneous tract external to the patient. The gastrostomy was assembled externally. Contrast injection confirms appropriate positioning within the stomach. Several spot radiographic images were obtained in various obliquities for documentation. Dressings were applied. The patient tolerated procedure well without immediate post procedural complication. FINDINGS: After successful  fluoroscopic guided placement, the gastrostomy tube is appropriately positioned with internal disc positioned against the  inner ventral wall of the gastric lumen. IMPRESSION: Successful fluoroscopic insertion of a 20-French pull-through gastrostomy tube. The gastrostomy may be used immediately for medication administration and in 24 hrs for the initiation of feeds. Electronically Signed   By: Sandi Mariscal M.D.   On: 05/15/2022 16:25   DG Abd Portable 1V  Result Date: 05/08/2022 CLINICAL DATA:  Feeding tube placement. EXAM: PORTABLE ABDOMEN - 1 VIEW COMPARISON:  05/05/2022. FINDINGS: Feeding tube is followed into the stomach with the tip projecting beyond the inferior margin of the image. Bowel gas pattern is grossly unremarkable. Numerous calcified granulomas in the lungs. Lung bases likely atelectasis. IMPRESSION: Feeding tube is followed into the stomach with the tip projecting beyond the inferior margin of the image. Electronically Signed   By: Lorin Picket M.D.   On: 05/08/2022 10:19   DG CHEST PORT 1 VIEW  Result Date: 05/06/2022 CLINICAL DATA:  [id number].  NGT placement. EXAM: PORTABLE CHEST 1 VIEW COMPARISON:  Portable chest 05/01/2022 FINDINGS: 10:04 p.m. NGT has been placed and terminates at the body of the stomach. The cardiomediastinal silhouette and vascular pattern are normal. The upper lung apices are clipped from the exposure. There is increased left infrahilar opacity today which could be due to atelectasis, pneumonia or aspiration. Remaining lungs are clear apart from scattered calcified granulomas. No pleural effusion is seen or measurable pneumothorax. Reverse S shaped thoracic scoliosis is again shown. IMPRESSION: 1. NGT terminates at the body of the stomach. 2. Increased left infrahilar opacity could be due to atelectasis, pneumonia or aspiration. Attention on follow-up films recommended. Electronically Signed   By: Telford Nab M.D.   On: 05/06/2022 22:21   DG Abd 1 View  Result Date: 05/05/2022 CLINICAL DATA:  Placement of enteric tube EXAM: ABDOMEN - 1 VIEW COMPARISON:  Previous studies  including the CT done on 12/31/2021 FINDINGS: Enteric tube is not seen in the radiograph. Bowel gas pattern in the upper abdomen is unremarkable. There are no focal infiltrates in the visualized lung fields. There are multiple calcified granulomas in both lungs. IMPRESSION: Enteric tube is not seen in the imaged. Electronically Signed   By: Elmer Picker M.D.   On: 05/05/2022 17:26   DG Abd Portable 1V  Result Date: 05/05/2022 CLINICAL DATA:  Enteric tube placement EXAM: PORTABLE ABDOMEN - 1 VIEW COMPARISON:  None Available. FINDINGS: Tip of enteric tube is seen in the region of fundus of the stomach. Bowel gas pattern in the upper abdomen is unremarkable. There is no definite evidence of pneumoperitoneum in this semi upright portable study. Lower abdomen is not included in the image. Low position of diaphragms suggests COPD. There are multiple calcified nodules in both lungs suggesting healed granulomas. There is no pleural effusion or pneumothorax. Dextroscoliosis is seen in thoracic spine. IMPRESSION: Tip of enteric tube is seen in the fundus of the stomach. Electronically Signed   By: Elmer Picker M.D.   On: 05/05/2022 17:24   MR BRAIN WO CONTRAST  Result Date: 05/05/2022 CLINICAL DATA:  New onset seizure. EXAM: MRI HEAD WITHOUT CONTRAST TECHNIQUE: Multiplanar, multiecho pulse sequences of the brain and surrounding structures were obtained without intravenous contrast. COMPARISON:  CT head 04/30/2022 FINDINGS: Brain: There is no acute intracranial hemorrhage, extra-axial fluid collection, or acute infarct Parenchymal volume is normal. The ventricles are normal in size. Gray-white differentiation is preserved. There are scattered small foci of FLAIR signal abnormality  in the subcortical and periventricular white matter. There is no structural or migration abnormality. The corpus callosum is normally formed. The hippocampi are normal in signal and architecture. Is a single punctate chronic  microhemorrhage in the right external capsule, nonspecific. The pituitary and suprasellar region are normal. There is no mass lesion. There is no mass effect or midline shift. Vascular: Normal flow voids. Skull and upper cervical spine: Normal marrow signal. Sinuses/Orbits: There is mucosal thickening with layering fluid in the right maxillary sinus. The globes and orbits are unremarkable. Other: There is mild right occipital scalp swelling, similar to the prior CT. There is a small right mastoid effusion. IMPRESSION: 1. No acute intracranial pathology or epileptogenic focus identified. 2. Mild right occipital scalp swelling, similar to the prior CT. 3. Scattered small foci of FLAIR signal abnormality in the supratentorial white matter are nonspecific but may reflect sequela of chronic small-vessel ischemic change, overall mild but accelerated for age. 4. Layering fluid in the right maxillary sinus which may reflect acute sinusitis in the correct clinical setting. Electronically Signed   By: Valetta Mole M.D.   On: 05/05/2022 16:06   Overnight EEG with video  Result Date: 05/01/2022 Lora Havens, MD     05/02/2022 10:17 AM Patient Name: GIOVANNI BATH MRN: [id number] Epilepsy Attending: Lora Havens Referring Physician/Provider: Greta Doom, MD Duration: 05/01/2022 0233 to 05/02/2022 0233 Patient history: 47 year old female being evaluated for nonconvulsive status epilepticus. Level of alertness: Awake, asleep AEDs during EEG study: LEV, LCM Technical aspects: This EEG study was done with scalp electrodes positioned according to the 10-20 International system of electrode placement. Electrical activity was reviewed with band pass filter of 1-'70Hz'$ , sensitivity of 7 uV/mm, display speed of 52m/sec with a '60Hz'$  notched filter applied as appropriate. EEG data were recorded continuously and digitally stored.  Video monitoring was available and reviewed as appropriate. Description: During awake  state, no clear posterior dominant rhythm was seen. Sleep was characterized by vertex waves, sleep spindles (12 to 14 Hz), maximal frontocentral region.  EEG showed continuous generalized and lateralized left hemisphere 3 to 5 Hz theta-delta slowing as well as 12 to 15 Hz beta activity seen predominantly in right hemisphere.  Sharp waves were noted in left hemisphere, maximal left posterior quadrant which appeared quasiperiodic at 0.5 to 1 Hz. Hyperventilation and photic stimulation were not performed.   ABNORMALITY - Sharp wave, left hemisphere, maximal left posterior quadrant - Continuous slow, generalized and lateralized left hemisphere IMPRESSION: This study showed evidence of epileptogenicity and cortical dysfunction arising from left hemisphere, maximal left posterior quadrant likely due to underlying structural abnormality.  Additionally there is moderate to severe diffuse encephalopathy, nonspecific to etiology.  No definite seizures were seen during the study. PLora Havens  EEG adult  Result Date: 05/01/2022 KGreta Doom MD     05/01/2022  2:44 AM History: 47year old female being evaluated for nonconvulsive status epilepticus Sedation: Ativan given earlier in the evening Technique: This EEG was acquired with electrodes placed according to the International 10-20 electrode system (including Fp1, Fp2, F3, F4, C3, C4, P3, P4, O1, O2, T3, T4, T5, T6, A1, A2, Fz, Cz, Pz). The following electrodes were missing or displaced: none. Background: There is a poorly sustained posterior dominant rhythm that is better seen on the right than left and she has a frequency of 10 Hz.  There are lateralized periodic discharges with phase reversal at F7 and theta wave morphology which wax and wane without  definite evolution throughout the recording.  There is also significant left hemispheric focal slow activity. Photic stimulation: Physiologic driving is now performed EEG Abnormalities: 1) lateralized  periodic discharges with theta wave morphology 2) left hemispheric slow activity Clinical Interpretation: This EEG recorded the pattern on the ictal-interictal continuum suggestive of a highly irritable area in the left hemisphere without definite seizure.  Recommend continuous EEG monitoring. Roland Rack, MD [facility] [phone] If 7pm- 7am, please page neurology on call as listed in AMION.'  DG Chest Port 1 View  Result Date: 05/01/2022 CLINICAL DATA:  Seizure EXAM: PORTABLE CHEST 1 VIEW COMPARISON:  04/29/2022 FINDINGS: Unchanged cardiac and mediastinal contours. Chronic scattered calcified pulmonary nodules, likely sequela of prior granulomatous disease. No new focal pulmonary opacity. No pleural effusion or pneumothorax. Scoliosis. No acute osseous abnormality. IMPRESSION: No acute cardiopulmonary process. Electronically Signed   By: Merilyn Baba M.D.   On: 05/01/2022 00:18   CT Head Wo Contrast  Result Date: 04/30/2022 CLINICAL DATA:  Head trauma, abnormal mental status (Age 26-64y) EXAM: CT HEAD WITHOUT CONTRAST TECHNIQUE: Contiguous axial images were obtained from the base of the skull through the vertex without intravenous contrast. RADIATION DOSE REDUCTION: This exam was performed according to the departmental dose-optimization program which includes automated exposure control, adjustment of the mA and/or kV according to patient size and/or use of iterative reconstruction technique. COMPARISON:  CT head 09/11/2018 FINDINGS: Brain: No evidence of large-territorial acute infarction. No parenchymal hemorrhage. No mass lesion. No extra-axial collection. No mass effect or midline shift. No hydrocephalus. Basilar cisterns are patent. Vascular: No hyperdense vessel. Skull: No acute fracture or focal lesion. Sinuses/Orbits: Right maxillary sinus mucosal thickening. Otherwise paranasal sinuses and mastoid air cells are clear. The orbits are unremarkable. Other: None. IMPRESSION:  No acute intracranial abnormality. Electronically Signed   By: Iven Finn M.D.   On: 04/30/2022 21:42   DG Chest 2 View  Result Date: 04/29/2022 CLINICAL DATA:  cough EXAM: CHEST - 2 VIEW COMPARISON:  Chest x-ray 12/30/2021 FINDINGS: The heart and mediastinal contours are within normal limits. Chronic multiple scattered calcified pulmonary nodules likely sequelae of prior granulomatous disease. No focal consolidation. No pulmonary edema. No pleural effusion. No pneumothorax. No acute osseous abnormality. Dextroscoliosis of the midthoracic spine. IMPRESSION: No active cardiopulmonary disease. Electronically Signed   By: Iven Finn M.D.   On: 04/29/2022 20:20    DISCHARGE EXAMINATION: Vitals:   05/16/22 2357 05/17/22 0431 05/17/22 0500 05/17/22 0756  BP: 116/81 103/72  127/86  Pulse: 92 98  94  Resp: '18 18  17  '$ Temp: 98.2 F (36.8 C) 98.7 F (37.1 C)  98.6 F (37 C)  TempSrc: Axillary Oral  Oral  SpO2: 100% 94%  99%  Weight:   43.5 kg   Height:       General appearance: Awake alert.  In no distress Resp: Clear to auscultation bilaterally.  Normal effort Cardio: S1-S2 is normal regular.  No S3-S4.  No rubs murmurs or bruit GI: Abdomen is soft.  Nontender nondistended.  Bowel sounds are present normal.  No masses organomegaly.  PEG tube is noted   DISPOSITION: SNF  Discharge Instructions     Ambulatory referral to Neurology   Complete by: As directed    An appointment is requested in approximately: 2 months for seizures   Call MD for:  difficulty breathing, headache or visual disturbances   Complete by: As directed    Call MD for:  extreme fatigue   Complete by: As  directed    Call MD for:  persistant dizziness or light-headedness   Complete by: As directed    Call MD for:  persistant nausea and vomiting   Complete by: As directed    Call MD for:  severe uncontrolled pain   Complete by: As directed    Call MD for:  temperature >100.4   Complete by: As directed     Discharge instructions   Complete by: As directed    Please review instructions on the discharge summary.  You were cared for by a hospitalist during your hospital stay. If you have any questions about your discharge medications or the care you received while you were in the hospital after you are discharged, you can call the unit and asked to speak with the hospitalist on call if the hospitalist that took care of you is not available. Once you are discharged, your primary care physician will handle any further medical issues. Please note that NO REFILLS for any discharge medications will be authorized once you are discharged, as it is imperative that you return to your primary care physician (or establish a relationship with a primary care physician if you do not have one) for your aftercare needs so that they can reassess your need for medications and monitor your lab values. If you do not have a primary care physician, you can call [phone] for a physician referral.   Increase activity slowly   Complete by: As directed          Allergies as of 05/17/2022   No Known Allergies      Medication List     STOP taking these medications    bisacodyl 5 MG EC tablet Generic drug: bisacodyl   diltiazem 240 MG 24 hr capsule Commonly known as: CARDIZEM CD   DULoxetine 30 MG capsule Commonly known as: CYMBALTA   famotidine 20 MG tablet Commonly known as: Pepcid   hydrOXYzine 50 MG capsule Commonly known as: VISTARIL   omeprazole 20 MG capsule Commonly known as: PRILOSEC   ondansetron 4 MG tablet Commonly known as: Zofran   oseltamivir 75 MG capsule Commonly known as: TAMIFLU   sucralfate 1 g tablet Commonly known as: Carafate   topiramate 50 MG tablet Commonly known as: TOPAMAX   traZODone 100 MG tablet Commonly known as: DESYREL       TAKE these medications    acetaminophen 500 MG tablet Commonly known as: TYLENOL Take 500-1,000 mg by mouth every 6 (six) hours as  needed for mild pain or headache.   diltiazem 30 MG tablet Commonly known as: Cardizem Place 1 tablet (30 mg total) into feeding tube 2 (two) times daily.   feeding supplement (OSMOLITE 1.2 CAL) Liqd Place 355 mLs into feeding tube 4 (four) times daily - after meals and at bedtime.   free water Soln Place 100 mLs into feeding tube every 6 (six) hours.   Lacosamide 100 MG Tabs Place 1 tablet (100 mg total) into feeding tube 2 (two) times daily.   levETIRAcetam 1000 MG tablet Commonly known as: KEPPRA Place 1 tablet (1,000 mg total) into feeding tube 2 (two) times daily. What changed:  medication strength how much to take how to take this   levothyroxine 75 MCG tablet Commonly known as: SYNTHROID Place 1 tablet (75 mcg total) into feeding tube daily before breakfast. Start taking on: May 18, 2022 What changed:  medication strength how to take this   polyethylene glycol 17 g packet Commonly known as:  MIRALAX / GLYCOLAX Place 17 g into feeding tube daily as needed.   senna-docusate 8.6-50 MG tablet Commonly known as: Senokot-S Place 2 tablets into feeding tube at bedtime.          Follow-up Information     Trey Sailors, PA Follow up.   Specialty: Physician Assistant Contact information: [address] [place] [address] [phone]                 TOTAL DISCHARGE TIME: 52 minutes  [place]  [facility] Pager on [email or web address]  05/17/2022, 9:43 AM

## 2022-05-17 NOTE — Plan of Care (Signed)
  Problem: Education: Goal: Knowledge of General Education information will improve Description: Including pain rating scale, medication(s)/side effects and non-pharmacologic comfort measures Outcome: Progressing   Problem: Nutrition: Goal: Adequate nutrition will be maintained Outcome: Progressing   

## 2022-05-17 NOTE — Progress Notes (Signed)
Attempted to call report to [facility] to nurse [place] two times. Was sent to voice mail and asked to leave a message. Left my call back information.

## 2022-05-19 ENCOUNTER — Emergency Department (HOSPITAL_COMMUNITY): Payer: Medicaid Other

## 2022-05-19 ENCOUNTER — Encounter (HOSPITAL_COMMUNITY): Payer: Self-pay | Admitting: Critical Care Medicine

## 2022-05-19 ENCOUNTER — Inpatient Hospital Stay (HOSPITAL_COMMUNITY)
Admission: EM | Admit: 2022-05-19 | Discharge: 2022-05-23 | DRG: 377 | Disposition: A | Discharge status: Skilled Nursing Facility | Payer: Medicaid Other | Attending: Internal Medicine | Admitting: Internal Medicine

## 2022-05-19 DIAGNOSIS — Z8521 Personal history of malignant neoplasm of larynx: Secondary | ICD-10-CM

## 2022-05-19 DIAGNOSIS — Z79890 Hormone replacement therapy (postmenopausal): Secondary | ICD-10-CM

## 2022-05-19 DIAGNOSIS — Z87891 Personal history of nicotine dependence: Secondary | ICD-10-CM | POA: Diagnosis not present

## 2022-05-19 DIAGNOSIS — K254 Chronic or unspecified gastric ulcer with hemorrhage: Secondary | ICD-10-CM | POA: Diagnosis present

## 2022-05-19 DIAGNOSIS — E43 Unspecified severe protein-calorie malnutrition: Secondary | ICD-10-CM | POA: Diagnosis present

## 2022-05-19 DIAGNOSIS — D509 Iron deficiency anemia, unspecified: Secondary | ICD-10-CM | POA: Diagnosis present

## 2022-05-19 DIAGNOSIS — K264 Chronic or unspecified duodenal ulcer with hemorrhage: Principal | ICD-10-CM | POA: Diagnosis present

## 2022-05-19 DIAGNOSIS — Z681 Body mass index (BMI) 19 or less, adult: Secondary | ICD-10-CM | POA: Diagnosis not present

## 2022-05-19 DIAGNOSIS — G40909 Epilepsy, unspecified, not intractable, without status epilepticus: Secondary | ICD-10-CM | POA: Diagnosis present

## 2022-05-19 DIAGNOSIS — K921 Melena: Secondary | ICD-10-CM

## 2022-05-19 DIAGNOSIS — Z923 Personal history of irradiation: Secondary | ICD-10-CM | POA: Diagnosis not present

## 2022-05-19 DIAGNOSIS — R1312 Dysphagia, oropharyngeal phase: Secondary | ICD-10-CM | POA: Diagnosis present

## 2022-05-19 DIAGNOSIS — D62 Acute posthemorrhagic anemia: Secondary | ICD-10-CM

## 2022-05-19 DIAGNOSIS — Z931 Gastrostomy status: Secondary | ICD-10-CM | POA: Diagnosis not present

## 2022-05-19 DIAGNOSIS — Z1152 Encounter for screening for COVID-19: Secondary | ICD-10-CM | POA: Diagnosis not present

## 2022-05-19 DIAGNOSIS — I10 Essential (primary) hypertension: Secondary | ICD-10-CM | POA: Diagnosis present

## 2022-05-19 DIAGNOSIS — A419 Sepsis, unspecified organism: Secondary | ICD-10-CM

## 2022-05-19 DIAGNOSIS — E872 Acidosis, unspecified: Secondary | ICD-10-CM | POA: Diagnosis present

## 2022-05-19 DIAGNOSIS — Z79899 Other long term (current) drug therapy: Secondary | ICD-10-CM

## 2022-05-19 DIAGNOSIS — R578 Other shock: Secondary | ICD-10-CM | POA: Diagnosis present

## 2022-05-19 DIAGNOSIS — Z9851 Tubal ligation status: Secondary | ICD-10-CM

## 2022-05-19 DIAGNOSIS — E039 Hypothyroidism, unspecified: Secondary | ICD-10-CM | POA: Diagnosis present

## 2022-05-19 DIAGNOSIS — E871 Hypo-osmolality and hyponatremia: Secondary | ICD-10-CM | POA: Diagnosis present

## 2022-05-19 DIAGNOSIS — R4182 Altered mental status, unspecified: Principal | ICD-10-CM

## 2022-05-19 DIAGNOSIS — G8929 Other chronic pain: Secondary | ICD-10-CM | POA: Diagnosis present

## 2022-05-19 DIAGNOSIS — E876 Hypokalemia: Secondary | ICD-10-CM | POA: Diagnosis present

## 2022-05-19 DIAGNOSIS — K279 Peptic ulcer, site unspecified, unspecified as acute or chronic, without hemorrhage or perforation: Secondary | ICD-10-CM | POA: Diagnosis not present

## 2022-05-19 DIAGNOSIS — D638 Anemia in other chronic diseases classified elsewhere: Secondary | ICD-10-CM | POA: Diagnosis not present

## 2022-05-19 DIAGNOSIS — G9340 Encephalopathy, unspecified: Secondary | ICD-10-CM | POA: Diagnosis not present

## 2022-05-19 LAB — COMPREHENSIVE METABOLIC PANEL
ALT: 19 U/L (ref 0–44)
AST: 36 U/L (ref 15–41)
Albumin: 2.1 g/dL — ABNORMAL LOW (ref 3.5–5.0)
Alkaline Phosphatase: 65 U/L (ref 38–126)
Anion gap: 6 (ref 5–15)
BUN: 23 mg/dL — ABNORMAL HIGH (ref 6–20)
CO2: 22 mmol/L (ref 22–32)
Calcium: 7.8 mg/dL — ABNORMAL LOW (ref 8.9–10.3)
Chloride: 102 mmol/L (ref 98–111)
Creatinine, Ser: 0.71 mg/dL (ref 0.44–1.00)
GFR, Estimated: >60 mL/min (ref >60)
Glucose, Bld: 101 mg/dL — ABNORMAL HIGH (ref 70–99)
Potassium: 3.4 mmol/L — ABNORMAL LOW (ref 3.5–5.1)
Sodium: 130 mmol/L — ABNORMAL LOW (ref 135–145)
Total Bilirubin: 0.3 mg/dL (ref 0.3–1.2)
Total Protein: 4.4 g/dL — ABNORMAL LOW (ref 6.5–8.1)

## 2022-05-19 LAB — URINALYSIS, ROUTINE W REFLEX MICROSCOPIC
Bilirubin Urine: NEGATIVE
Glucose, UA: NEGATIVE mg/dL
Hgb urine dipstick: NEGATIVE
Ketones, ur: NEGATIVE mg/dL
Leukocytes,Ua: NEGATIVE
Nitrite: NEGATIVE
Protein, ur: NEGATIVE mg/dL
Specific Gravity, Urine: 1.023 (ref 1.005–1.030)
pH: 6 (ref 5.0–8.0)

## 2022-05-19 LAB — CBC WITH DIFFERENTIAL/PLATELET: Smear Review: UNDETERMINED

## 2022-05-19 LAB — LACTIC ACID, PLASMA: Lactic Acid, Venous: 2.4 mmol/L (ref 0.5–1.9)

## 2022-05-19 LAB — PROTIME-INR
INR: 1.2 (ref 0.8–1.2)
Prothrombin Time: 14.8 seconds (ref 11.4–15.2)

## 2022-05-19 LAB — POC OCCULT BLOOD, ED: Fecal Occult Bld: POSITIVE — AB

## 2022-05-19 LAB — I-STAT BETA HCG BLOOD, ED (MC, WL, AP ONLY): I-stat hCG, quantitative: 6.4 m[IU]/mL — ABNORMAL HIGH (ref <5)

## 2022-05-19 LAB — APTT: aPTT: 29 seconds (ref 24–36)

## 2022-05-19 LAB — PREPARE RBC (CROSSMATCH)

## 2022-05-19 MED ORDER — ONDANSETRON HCL 4 MG/2ML IJ SOLN: 4.0000 mg | Freq: Four times a day (QID) | INTRAMUSCULAR | Status: DC | PRN

## 2022-05-19 MED ORDER — SODIUM CHLORIDE 0.9% IV SOLUTION: Freq: Once | INTRAVENOUS | Status: DC

## 2022-05-19 MED ORDER — PIPERACILLIN-TAZOBACTAM 3.375 G IVPB 30 MIN
3.3750 g | Freq: Once | INTRAVENOUS | Status: AC
  Administered 2022-05-19: 3.375 g via INTRAVENOUS
  Filled 2022-05-19: qty 50

## 2022-05-19 MED ORDER — SODIUM CHLORIDE 0.9% IV SOLUTION: Freq: Once | INTRAVENOUS | Status: AC

## 2022-05-19 MED ORDER — DOCUSATE SODIUM 50 MG/5ML PO LIQD: 100.0000 mg | Freq: Two times a day (BID) | ORAL | Status: DC | PRN

## 2022-05-19 MED ORDER — LEVETIRACETAM IN NACL 1000 MG/100ML IV SOLN
1000.0000 mg | Freq: Two times a day (BID) | INTRAVENOUS | Status: DC
  Administered 2022-05-20 – 2022-05-22 (×6): 1000 mg via INTRAVENOUS
  Filled 2022-05-19 (×8): qty 100

## 2022-05-19 MED ORDER — VANCOMYCIN HCL IN DEXTROSE 1-5 GM/200ML-% IV SOLN
1000.0000 mg | Freq: Once | INTRAVENOUS | Status: AC
  Administered 2022-05-19: 1000 mg via INTRAVENOUS
  Filled 2022-05-19: qty 200

## 2022-05-19 MED ORDER — LEVETIRACETAM IN NACL 1000 MG/100ML IV SOLN
1000.0000 mg | Freq: Once | INTRAVENOUS | Status: AC
  Administered 2022-05-19: 1000 mg via INTRAVENOUS
  Filled 2022-05-19: qty 100

## 2022-05-19 MED ORDER — SODIUM CHLORIDE 0.9 % IV SOLN
100.0000 mg | Freq: Two times a day (BID) | INTRAVENOUS | Status: DC
  Administered 2022-05-20 – 2022-05-22 (×6): 100 mg via INTRAVENOUS
  Filled 2022-05-19 (×10): qty 10

## 2022-05-19 MED ORDER — PANTOPRAZOLE SODIUM 40 MG IV SOLR
40.0000 mg | Freq: Two times a day (BID) | INTRAVENOUS | Status: DC
  Administered 2022-05-20 – 2022-05-23 (×8): 40 mg via INTRAVENOUS
  Filled 2022-05-19 (×8): qty 10

## 2022-05-19 MED ORDER — POLYETHYLENE GLYCOL 3350 17 G PO PACK: 17.0000 g | PACK | Freq: Every day | ORAL | Status: DC | PRN

## 2022-05-19 MED ORDER — LACOSAMIDE 10 MG/ML PO SOLN
100.0000 mg | Freq: Once | ORAL | Status: AC
  Administered 2022-05-19: 100 mg
  Filled 2022-05-19 (×2): qty 10

## 2022-05-19 MED ORDER — LACTATED RINGERS IV BOLUS (SEPSIS)
1000.0000 mL | Freq: Once | INTRAVENOUS | Status: AC
  Administered 2022-05-19: 1000 mL via INTRAVENOUS

## 2022-05-19 MED ORDER — IOHEXOL 350 MG/ML SOLN
75.0000 mL | Freq: Once | INTRAVENOUS | Status: AC | PRN
  Administered 2022-05-19: 75 mL via INTRAVENOUS

## 2022-05-19 NOTE — ED Triage Notes (Signed)
Patient bib [facility] from a local hotel due to altered mental status and weakness. Family reports she is not acting normal. Patient was just d/c from a rehab facility 2 days ago after having seizures. [facility] reports Patient is hypotensive and tachycardic. Hx of throat cancer and seizures.

## 2022-05-19 NOTE — ED Provider Notes (Signed)
Crystal Spence   CSN: [id number] Arrival date & time: 05/19/22  1950     History {Add pertinent medical, surgical, social history, OB history to HPI:1} Chief Complaint  Patient presents with   Altered Mental Status    Crystal Spence is a 47 y.o. female.  Past medical history of alcohol use disorder, laryngeal cancer, hypertension, seizures on Vimpat and Keppra, upper GI bleed, hyponatremia, hypothyroidism, leukopenia, pneumonia and pulmonary abscess.  Presents to the emergency department today with altered mental status.  Was with family at a local hotel and reportedly not acting normal about 6 hours prior to arrival.  Was just released from rehab facility 2 days ago after stay after having seizures.  Had a G-tube placed in December of this year, just a couple weeks ago, to have her seizure meds administered through the G-tube.  With EMS was hypotensive and tachycardic, blood pressure 70/40 tachycardia to 110.   Altered Mental Status      Home Medications Prior to Admission medications   Medication Sig Start Date End Date Taking? Authorizing Provider  acetaminophen (TYLENOL) 500 MG tablet Take 500-1,000 mg by mouth every 6 (six) hours as needed for mild pain or headache.    [provider]  diltiazem (CARDIZEM) 30 MG tablet Place 1 tablet (30 mg total) into feeding tube 2 (two) times daily. 05/17/22 11/08/23  Bonnielee Haff, MD  lacosamide 100 MG TABS Place 1 tablet (100 mg total) into feeding tube 2 (two) times daily. 05/17/22   Bonnielee Haff, MD  levETIRAcetam (KEPPRA) 1000 MG tablet Place 1 tablet (1,000 mg total) into feeding tube 2 (two) times daily. 05/17/22   Bonnielee Haff, MD  levothyroxine (SYNTHROID) 75 MCG tablet Place 1 tablet (75 mcg total) into feeding tube daily before breakfast. 05/18/22   Bonnielee Haff, MD  Nutritional Supplements (FEEDING SUPPLEMENT, OSMOLITE 1.2 CAL,) LIQD Place 355 mLs into feeding tube 4  (four) times daily - after meals and at bedtime. 05/17/22   Bonnielee Haff, MD  polyethylene glycol (MIRALAX / GLYCOLAX) 17 g packet Place 17 g into feeding tube daily as needed. 05/17/22   Bonnielee Haff, MD  senna-docusate (SENOKOT-S) 8.6-50 MG tablet Place 2 tablets into feeding tube at bedtime. 05/17/22   Bonnielee Haff, MD  Water For Irrigation, Sterile (FREE WATER) SOLN Place 100 mLs into feeding tube every 6 (six) hours. 05/17/22   Bonnielee Haff, MD      Allergies    Patient has no known allergies.    Review of Systems   Review of Systems  Physical Exam Updated Vital Signs BP (!) 81/50   Pulse (!) 107   Temp 97.9 F (36.6 C) (Axillary)   Resp (!) 22   LMP 10/20/2012 Comment: pt. has had tubal ligation,no longer has peroids  SpO2 97%  Physical Exam Vitals and nursing Spence reviewed.  Constitutional:      General: She is not in acute distress.    Appearance: She is well-developed. She is ill-appearing. She is not diaphoretic.  HENT:     Head: Normocephalic and atraumatic.     Mouth/Throat:     Mouth: Mucous membranes are dry.  Eyes:     Conjunctiva/sclera: Conjunctivae normal.  Cardiovascular:     Rate and Rhythm: Regular rhythm. Tachycardia present.     Heart sounds: No murmur heard. Pulmonary:     Effort: Pulmonary effort is normal. No respiratory distress.     Breath sounds: Normal breath sounds. No stridor.  No wheezing, rhonchi or rales.  Abdominal:     General: Abdomen is flat.     Palpations: Abdomen is soft.     Tenderness: There is no abdominal tenderness. There is no guarding or rebound.     Comments: G-tube in place, no surrounding erythema or drainage or abnormal discharge  Musculoskeletal:        General: No swelling.     Cervical back: Neck supple.     Right lower leg: No edema.     Left lower leg: No edema.  Skin:    General: Skin is warm and dry.     Capillary Refill: Capillary refill takes less than 2 seconds.     Coloration: Skin is pale.   Neurological:     Mental Status: She is alert. She is disoriented and confused.     GCS: GCS eye subscore is 4. GCS verbal subscore is 2. GCS motor subscore is 5.  Psychiatric:        Mood and Affect: Mood normal.     ED Results / Procedures / Treatments   Labs (all labs ordered are listed, but only abnormal results are displayed) Labs Reviewed - No data to display  EKG None  Radiology No results found.  Procedures Procedures  {Document cardiac monitor, telemetry assessment procedure when appropriate:1}  Medications Ordered in ED Medications - No data to display  ED Course/ Medical Decision Making/ A&P                           Medical Decision Making Amount and/or Complexity of Data Reviewed Labs: ordered. Radiology: ordered. ECG/medicine tests: ordered.  Risk Prescription drug management.   Crystal Spence is a 47 y.o. female with significant PMH alcohol use disorder, laryngeal cancer, hypertension, seizures on Vimpat and Keppra, upper GI bleed, hyponatremia, hypothyroidism, leukopenia, pneumonia and pulmonary abscess, who presents to the ED with ***.  ***  Differential diagnosis includes infectious (encephalitis, meningitis, pneumonia, UTI, soft tissue), polypharmacy, intoxication, cardiac ischemia, metabolic encephalopathy, CNS insult (ICH, SAH, infarct), seizures, thyroid storm.   *** obtained, ***. CT head *** obtained ***. EKG showed ***. ***   Given ***.  Patient most likely has altered mental status secondary to ***.   ***On re-evaluation,   Patient treatment plan discussed with and medical decision making supervised by my attending physician Dr. Marland Kitchen   {Document critical care time when appropriate:1} {Document review of labs and clinical decision tools ie heart score, Chads2Vasc2 etc:1}  {Document your independent review of radiology images, and any outside records:1} {Document your discussion with family members, caretakers, and with  consultants:1} {Document social determinants of health affecting pt's care:1} {Document your decision making why or why not admission, treatments were needed:1} Final Clinical Impression(s) / ED Diagnoses Final diagnoses:  None    Rx / DC Orders ED Discharge Orders     None

## 2022-05-19 NOTE — H&P (Signed)
NAME:  Crystal Spence, MRN:  [id number], DOB:  17-May-1975, LOS: 1 ADMISSION DATE:  05/19/2022, CONSULTATION DATE:  05/19/22 REFERRING MD:  Tyrone Nine - EM, CHIEF COMPLAINT:  AMS    History of Present Illness:  47 yo F PMH seizure disorder, laryngeal ca s/p radiation with resultant oropharyngeal dysphagia and related malnutrition requiring G tube, previous gastric ulcers (2019, neg for Hpylori), recent Flu PNA, hypothyroidism, presents to ED 1/8 from [place] with AMS. Has had dark tarry stools for about 2 days. Was hypotensive with EMS and received 579m NS. In ED rcvd additional 1L IVF, as well as empiric abx for possible sepsis.  CMP with minor lyte abnormalities but normal renal and hepatic fxn. CBC however was "unreliable"-- presumably undetectably low values based on a POC read which revealed a HCT < 15 and an undetectably low Hgb. CT H WNL. CT a/p with con did not reveal an acute process to explain.  Receiving 2 PRBC in ED.  PCCM consulted for admission in this setting   Pertinent  Medical History  Seizure disorder Laryngeal cancer Oropharyngeal dysphagia Severe protein calorie malnutrition Hypothyroidism HTN Gastric ulcers   Significant Hospital Events: Including procedures, antibiotic start and stop dates in addition to other pertinent events   1/8 ED from hotel with weakness, AMS. Undetectably low Hgb HCT. 2 PRBC, admitting to ICU. Add/' 2 PRBC 1 plt 1 FFP   Interim History / Subjective:  Receiving 2nd PRBC in ED  Objective   Blood pressure 95/64, pulse (!) 114, temperature 97.7 F (36.5 C), temperature source Oral, resp. rate 20, last menstrual period 10/20/2012, SpO2 97 %.        Intake/Output Summary (Last 24 hours) at 05/20/2022 0018 Last data filed at 05/19/2022 2225 Gross per 24 hour  Intake 3520 ml  Output --  Net 3520 ml   There were no vitals filed for this visit.  Examination: General: chronically and acutely ill middle aged F NAD  HENT: NCAT. Pale lips, pale  lower eyelids, tongue is pale/grey at periphery with pink midline coloration.  Lungs: Even unlabored on RA.  Cardiovascular: tachycardic, regular. Cap refill is sluggish  Abdomen: soft ndnt. + bowel sounds. G tube  Extremities: no acute joint deformity. Decreased muscle mass  Neuro: Lethargic, oriented x 2 following commands.  [facility]     Assessment & Plan:   Acute encephalopathy due to shock Seizure disorder (with possible breakthrough seizure, though think lethargy is more likely 2/2 shock and anemia)   Hemorrhagic shock due to presumed GIB ABLA, severe   Hx gastric ulcers  Chronic iron deficiency anemia Lactic acidosis  Hyponatremia Hypokalemia  Hx HTN  Hypothyroidism  Oropharyngeal dysphagia  Severe protein calorie malnutrition  -undetectable values for CBC. On POC labs, Hct is <15 and hgb undetectably low. Labw have resulted an INR at 1.2  -She is receiving 2 PRBC in ED.  - has + heme occult in ED  -has had melena for 2 days  -CT a/p with contrast is neg. Is not having chest pain, SOB, back pain -- dont think there is great utility in CTA c/a/p.    -think that BP, mental status, LA are all r/t anemic process. Doubt septic. Did receive vanc zosyn in ED  P -Admit to ICU -CBC is pending, lab is having to run this manually.  -will give additional 2 PRBC, as well as 1 FFP 1 Plt  -for product resuscitation, favor large bore IV over  central access -- can consult IV team if add'l PIV access is needed.  -add on DIC panel   -serial CBC  -NPO -BID PPI -Consult GI in AM  -hold antihypertensives  -anticipate K will incr with blood product administration  -Follow BMP  -IV Keppra and IV Vimpat while NPO  -can defer synthroid for a day or so until either taking POs or meeting pharm threshold for IV admin  -will check a PCT -- for now am not ordering further abx (rcved 1x vanc and zosyn in ED)   Best Practice (right click and "Reselect all  SmartList Selections" daily)   Diet/type: NPO DVT prophylaxis: SCD GI prophylaxis: PPI Lines: N/A Foley:  N/A Code Status:  full code Last date of multidisciplinary goals of care discussion [--]  Labs   CBC: Recent Labs  Lab 05/13/22 0450 05/16/22 0319 05/19/22 2113 05/19/22 2146  WBC 5.8 8.1 QUESTIONABLE RESULTS, RECOMMEND RECOLLECT TO VERIFY Not Measured  NEUTROABS  --   --  QUESTIONABLE RESULTS, RECOMMEND RECOLLECT TO VERIFY  --   HGB 11.2* 10.6* QUESTIONABLE RESULTS, RECOMMEND RECOLLECT TO VERIFY Not Measured  HCT 33.7* 31.2* QUESTIONABLE RESULTS, RECOMMEND RECOLLECT TO VERIFY Not Measured  MCV 96.8 96.6 QUESTIONABLE RESULTS, RECOMMEND RECOLLECT TO VERIFY Not Measured  PLT 356 395 QUESTIONABLE RESULTS, RECOMMEND RECOLLECT TO VERIFY Not Measured    Basic Metabolic Panel: Recent Labs  Lab 05/13/22 0450 05/15/22 0603 05/16/22 0319 05/19/22 2113  NA 133*  --  130* 130*  K 3.6  --  3.7 3.4*  CL 96*  --  101 102  CO2 27  --  21* 22  GLUCOSE 83  --  76 101*  BUN 10  --  10 23*  CREATININE 0.82 0.68 0.69 0.71  CALCIUM 8.5*  --  7.9* 7.8*  MG  --   --  1.7  --    GFR: Estimated Creatinine Clearance: 59.1 mL/min (by C-G formula based on SCr of 0.71 mg/dL). Recent Labs  Lab 05/13/22 0450 05/16/22 0319 05/19/22 2051 05/19/22 2113 05/19/22 2146  WBC 5.8 8.1  --  QUESTIONABLE RESULTS, RECOMMEND RECOLLECT TO VERIFY Not Measured  LATICACIDVEN  --   --  2.4*  --   --     Liver Function Tests: Recent Labs  Lab 05/19/22 2113  AST 36  ALT 19  ALKPHOS 65  BILITOT 0.3  PROT 4.4*  ALBUMIN 2.1*   No results for input(s): "LIPASE", "AMYLASE" in the last 168 hours. No results for input(s): "AMMONIA" in the last 168 hours.  ABG    Component Value Date/Time   HCO3 26.3 05/03/2018 0153   TCO2 24 04/30/2022 1617   O2SAT 60.0 05/03/2018 0153     Coagulation Profile: Recent Labs  Lab 05/19/22 2113  INR 1.2    Cardiac Enzymes: No results for input(s):  "CKTOTAL", "CKMB", "CKMBINDEX", "TROPONINI" in the last 168 hours.  HbA1C: No results found for: "HGBA1C"  CBG: Recent Labs  Lab 05/15/22 2342 05/16/22 1115 05/16/22 1151 05/17/22 0002 05/17/22 0759  GLUCAP 85 69* 160* 98 92    Review of Systems:   Review of Systems  Constitutional:  Positive for malaise/fatigue.  Respiratory:  Positive for cough. Negative for hemoptysis, shortness of breath and wheezing.   Cardiovascular:  Negative for chest pain and palpitations.  Gastrointestinal:  Positive for melena. Negative for abdominal pain, blood in stool, constipation, diarrhea, nausea and vomiting.  Genitourinary:  Negative for flank pain and hematuria.  Musculoskeletal:  Negative for back pain  and joint pain.  Neurological:  Positive for dizziness and weakness.  Endo/Heme/Allergies: Negative.      Past Medical History:  She,  has a past medical history of Anemia, unspecified (06/17/2013), Headache, History of laryngeal cancer (02/03/2013), Hypertension, Hypothyroidism (acquired) (06/17/2013), Multiple lung nodules (06/28/2014), Pneumonia (01/14/2014, 04/2017), Poor oral hygiene (06/17/2013), Rib pain (01/14/2014), Rib pain on left side (01/05/2014), and Throat cancer ([place]).   Surgical History:   Past Surgical History:  Procedure Laterality Date   ABDOMINAL SURGERY     BIOPSY  01/18/2018   Procedure: BIOPSY;  Surgeon: Wonda Horner, MD;  Location: [facility];  Service: Endoscopy;;   COLONOSCOPY WITH PROPOFOL N/A 10/12/2015   Procedure: COLONOSCOPY WITH PROPOFOL;  Surgeon: Wonda Horner, MD;  Location: [facility];  Service: Endoscopy;  Laterality: N/A;   ESOPHAGOGASTRODUODENOSCOPY N/A 02/03/2013   Procedure: ESOPHAGOGASTRODUODENOSCOPY (EGD);  Surgeon: Juanita Craver, MD;  Location: [facility];  Service: Endoscopy;  Laterality: N/A;   ESOPHAGOGASTRODUODENOSCOPY N/A 10/12/2015   Procedure: ESOPHAGOGASTRODUODENOSCOPY (EGD);  Surgeon: Wonda Horner, MD;  Location: [facility];  Service: Endoscopy;   Laterality: N/A;   ESOPHAGOGASTRODUODENOSCOPY (EGD) WITH PROPOFOL N/A 01/18/2018   Procedure: ESOPHAGOGASTRODUODENOSCOPY (EGD) WITH PROPOFOL;  Surgeon: Wonda Horner, MD;  Location[facility] END[facility];  Service: Endoscopy;  Laterality: N/A;   IR GASTROSTOMY TUBE MOD SED  05/15/2022   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 She has a 15.00 pack-year smoking history. She has never used smokeless tobacco. She reports current alcohol use of about 2.0 standard drinks of alcohol per week. She reports that she does not use drugs.   Family History:  Her family history includes Cancer in her maternal grandmother.   Allergies No Known Allergies   Home Medications  Prior to Admission medications   Medication Sig Start Date End Date Taking? Authorizing Provider  acetaminophen (TYLENOL) 500 MG tablet Take 500-1,000 mg by mouth every 6 (six) hours as needed for mild pain or headache.    [provider]  diltiazem (CARDIZEM) 30 MG tablet Place 1 tablet (30 mg total) into feeding tube 2 (two) times daily. 05/17/22 11/08/23  Bonnielee Haff, MD  lacosamide 100 MG TABS Place 1 tablet (100 mg total) into feeding tube 2 (two) times daily. 05/17/22   Bonnielee Haff, MD  levETIRAcetam (KEPPRA) 1000 MG tablet Place 1 tablet (1,000 mg total) into feeding tube 2 (two) times daily. 05/17/22   Bonnielee Haff, MD  levothyroxine (SYNTHROID) 75 MCG tablet Place 1 tablet (75 mcg total) into feeding tube daily before breakfast. 05/18/22   Bonnielee Haff, MD  Nutritional Supplements (FEEDING SUPPLEMENT, OSMOLITE 1.2 CAL,) LIQD Place 355 mLs into feeding tube 4 (four) times daily - after meals and at bedtime. 05/17/22   Bonnielee Haff, MD  polyethylene  glycol (MIRALAX / GLYCOLAX) 17 g packet Place 17 g into feeding tube daily as needed. 05/17/22   Bonnielee Haff, MD  senna-docusate (SENOKOT-S) 8.6-50 MG tablet Place 2 tablets into feeding tube at bedtime. 05/17/22   Bonnielee Haff, MD  Water For Irrigation, Sterile (FREE WATER) SOLN Place 100 mLs into feeding tube every 6 (six) hours. 05/17/22   Bonnielee Haff, MD     Critical care  time: 45 min     CRITICAL CARE Performed by: Cristal Generous   Total critical care time: 45 minutes  Critical care time was exclusive of separately billable procedures and treating other patients. Critical care was necessary to treat or prevent imminent or life-threatening deterioration.  Critical care was time spent personally by me on the following activities: development of treatment plan with patient and/or surrogate as well as nursing, discussions with consultants, evaluation of patient's response to treatment, examination of patient, obtaining history from patient or surrogate, ordering and performing treatments and interventions, ordering and review of laboratory studies, ordering and review of radiographic studies, pulse oximetry and re-evaluation of patient's condition.   Eliseo Gum MSN, AGACNP-BC [place] for pager  05/20/2022, 12:18 AM

## 2022-05-19 NOTE — H&P (Incomplete)
NAME:  Crystal Spence, MRN:  [id number], DOB:  1975-09-11, LOS: 0 ADMISSION DATE:  05/19/2022, CONSULTATION DATE:  *** REFERRING MD:  ***, CHIEF COMPLAINT:  ***   History of Present Illness:  ***  Pertinent  Medical History  ***  Significant Hospital Events: Including procedures, antibiotic start and stop dates in addition to other pertinent events     Interim History / Subjective:  ***  Objective   Blood pressure 95/64, pulse (!) 114, temperature 97.7 F (36.5 C), temperature source Oral, resp. rate 20, last menstrual period 10/20/2012, SpO2 97 %.        Intake/Output Summary (Last 24 hours) at 05/19/2022 2340 Last data filed at 05/19/2022 2225 Gross per 24 hour  Intake 3520 ml  Output --  Net 3520 ml   There were no vitals filed for this visit.  Examination: General: *** HENT: *** Lungs: *** Cardiovascular: *** Abdomen: *** Extremities: *** Neuro: *** GU: ***  Resolved Hospital Problem list   ***  Assessment & Plan:   Acute encephalopathy due to shock Seizure disorder (with possible breakthrough seizure, though think lethargy is more likely 2/2 shock and anemia)   Hemorrhagic shock due to presumed GIB ABLA, severe   Chronic iron deficiency anemia Lactic acidosis  Hyponatremia Hypokalemia  Hx HTN  Hypothyroidism  Oropharyngeal dysphagia  Severe protein calorie malnutrition  -undetectable values for CBC. On POC labs, Hct is <15 and hgb undetectably low. Labw have resulted an INR at 1.2  -She is receiving 2 PRBC in ED -has had melena for 2 days  -CT a/p with contrast is neg. Is not having chest pain, SOB, back pain -- dont think there is great utility in CTA c/a/p.    P -Admit to ICU -CBC is pending, lab is having to run this manually.  -will give additional 2 PRBC, as well as 1 FFP 1 Plt  -add on DIC panel   -serial CBC  -NPO -BID PPI -Consult GI in AM  -hold antihypertensives  -anticipate K will incr with blood product administration  -IV  Keppra and IV Vimpat while NPO  -can defer sy thr  Best Practice (right click and "Reselect all SmartList Selections" daily)   Diet/type: {diet type:25684} DVT prophylaxis: {anticoagulation (Optional):25687} GI prophylaxis: {DV:76160} Lines: {Central Venous Access:25771} Foley:  {Central Venous Access:25691} Code Status:  {Code Status:26939} Last date of multidisciplinary goals of care discussion [***]  Labs   CBC: Recent Labs  Lab 05/13/22 0450 05/16/22 0319 05/19/22 2113  WBC 5.8 8.1 QUESTIONABLE RESULTS, RECOMMEND RECOLLECT TO VERIFY  NEUTROABS  --   --  QUESTIONABLE RESULTS, RECOMMEND RECOLLECT TO VERIFY  HGB 11.2* 10.6* QUESTIONABLE RESULTS, RECOMMEND RECOLLECT TO VERIFY  HCT 33.7* 31.2* QUESTIONABLE RESULTS, RECOMMEND RECOLLECT TO VERIFY  MCV 96.8 96.6 QUESTIONABLE RESULTS, RECOMMEND RECOLLECT TO VERIFY  PLT 356 395 QUESTIONABLE RESULTS, RECOMMEND RECOLLECT TO VERIFY    Basic Metabolic Panel: Recent Labs  Lab 05/13/22 0450 05/15/22 0603 05/16/22 0319 05/19/22 2113  NA 133*  --  130* 130*  K 3.6  --  3.7 3.4*  CL 96*  --  101 102  CO2 27  --  21* 22  GLUCOSE 83  --  76 101*  BUN 10  --  10 23*  CREATININE 0.82 0.68 0.69 0.71  CALCIUM 8.5*  --  7.9* 7.8*  MG  --   --  1.7  --    GFR: Estimated Creatinine Clearance: 59.1 mL/min (by C-G formula based on SCr of 0.71  mg/dL). Recent Labs  Lab 05/13/22 0450 05/16/22 0319 05/19/22 2051 05/19/22 2113  WBC 5.8 8.1  --  QUESTIONABLE RESULTS, RECOMMEND RECOLLECT TO VERIFY  LATICACIDVEN  --   --  2.4*  --     Liver Function Tests: Recent Labs  Lab 05/19/22 2113  AST 36  ALT 19  ALKPHOS 65  BILITOT 0.3  PROT 4.4*  ALBUMIN 2.1*   No results for input(s): "LIPASE", "AMYLASE" in the last 168 hours. No results for input(s): "AMMONIA" in the last 168 hours.  ABG    Component Value Date/Time   HCO3 26.3 05/03/2018 0153   TCO2 24 04/30/2022 1617   O2SAT 60.0 05/03/2018 0153     Coagulation  Profile: Recent Labs  Lab 05/19/22 2113  INR 1.2    Cardiac Enzymes: No results for input(s): "CKTOTAL", "CKMB", "CKMBINDEX", "TROPONINI" in the last 168 hours.  HbA1C: No results found for: "HGBA1C"  CBG: Recent Labs  Lab 05/15/22 2342 05/16/22 1115 05/16/22 1151 05/17/22 0002 05/17/22 0759  GLUCAP 85 69* 160* 98 92    Review of Systems:   Review of Systems  Constitutional:  Positive for malaise/fatigue.  Respiratory:  Positive for cough. Negative for hemoptysis, shortness of breath and wheezing.   Cardiovascular:  Negative for chest pain and palpitations.  Gastrointestinal:  Positive for melena. Negative for abdominal pain, blood in stool, constipation, diarrhea, nausea and vomiting.  Genitourinary:  Negative for flank pain and hematuria.  Musculoskeletal:  Negative for back pain and joint pain.  Neurological:  Positive for dizziness and weakness.  Endo/Heme/Allergies: Negative.      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   Surgical History:   Past Surgical History:  Procedure Laterality Date  . ABDOMINAL SURGERY    . BIOPSY  01/18/2018   Procedure: BIOPSY;  Surgeon: Wonda Horner, MD;  Location: [facility];  Service: Endoscopy;;  . COLONOSCOPY WITH PROPOFOL N/A 10/12/2015   Procedure: COLONOSCOPY WITH PROPOFOL;  Surgeon: Wonda Horner, MD;  Location: [facility];  Service: Endoscopy;  Laterality: N/A;  . ESOPHAGOGASTRODUODENOSCOPY N/A 02/03/2013   Procedure: ESOPHAGOGASTRODUODENOSCOPY (EGD);  Surgeon: Juanita Craver, MD;  Location: [facility];  Service: Endoscopy;  Laterality: N/A;  . ESOPHAGOGASTRODUODENOSCOPY N/A 10/12/2015   Procedure:  ESOPHAGOGASTRODUODENOSCOPY (EGD);  Surgeon: Wonda Horner, MD;  Location: [facility];  Service: Endoscopy;  Laterality: N/A;  . ESOPHAGOGASTRODUODENOSCOPY (EGD) WITH PROPOFOL N/A 01/18/2018   Procedure: ESOPHAGOGASTRODUODENOSCOPY (EGD) WITH PROPOFOL;  Surgeon: Wonda Horner, MD;  Location[facility] END[facility]COPY;  Service: Endoscopy;  Laterality: N/A;  . IR GASTROSTOMY TUBE MOD SED  05/15/2022  . 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 TRACHEOSTOMY     for throat inflammation from radiation  . TUBAL LIGATION       Social History:   reports that she quit smoking about 11 years ago. She has a 15.00 pack-year smoking history. She has never used smokeless tobacco. She reports current alcohol use of about 2.0 standard drinks of alcohol per week. She reports that she does not use drugs.   Family History:  Her family history includes Cancer in her maternal grandmother.  Allergies No Known Allergies   Home Medications  Prior to Admission medications   Medication Sig Start Date End Date Taking? Authorizing Provider  acetaminophen (TYLENOL) 500 MG tablet Take 500-1,000 mg by mouth every 6 (six) hours as needed for mild pain or headache.    [provider]  diltiazem (CARDIZEM) 30 MG tablet Place 1 tablet (30 mg total) into feeding tube 2 (two) times daily. 05/17/22 11/08/23  Bonnielee Haff, MD  lacosamide 100 MG TABS Place 1 tablet (100 mg total) into feeding tube 2 (two) times daily. 05/17/22   Bonnielee Haff, MD  levETIRAcetam (KEPPRA) 1000 MG tablet Place 1 tablet (1,000 mg total) into feeding tube 2 (two) times daily. 05/17/22   Bonnielee Haff, MD  levothyroxine (SYNTHROID) 75 MCG tablet Place 1 tablet (75 mcg total) into feeding tube daily before breakfast. 05/18/22   Bonnielee Haff, MD  Nutritional Supplements (FEEDING SUPPLEMENT, OSMOLITE 1.2 CAL,) LIQD Place 355  mLs into feeding tube 4 (four) times daily - after meals and at bedtime. 05/17/22   Bonnielee Haff, MD  polyethylene glycol (MIRALAX / GLYCOLAX) 17 g packet Place 17 g into feeding tube daily as needed. 05/17/22   Bonnielee Haff, MD  senna-docusate (SENOKOT-S) 8.6-50 MG tablet Place 2 tablets into feeding tube at bedtime. 05/17/22   Bonnielee Haff, MD  Water For Irrigation, Sterile (FREE WATER) SOLN Place 100 mLs into feeding tube every 6 (six) hours. 05/17/22   Bonnielee Haff, MD     Critical care time: ***       Eliseo Gum MSN, AGACNP-BC [place] for pager  05/19/2022, 11:40 PM

## 2022-05-19 NOTE — ED Notes (Signed)
Pt in agreeance to receive blood products. Witnessed by this RN and Product/process development scientist. Consent signed by 2 RN's due to pt weakness and lethargy.

## 2022-05-19 NOTE — ED Notes (Signed)
Repeat CBC sent to lab, repeat I-stat ran. Results inconclusive for I-stat.

## 2022-05-20 DIAGNOSIS — E876 Hypokalemia: Secondary | ICD-10-CM | POA: Diagnosis not present

## 2022-05-20 DIAGNOSIS — D62 Acute posthemorrhagic anemia: Secondary | ICD-10-CM | POA: Diagnosis not present

## 2022-05-20 DIAGNOSIS — G9340 Encephalopathy, unspecified: Secondary | ICD-10-CM

## 2022-05-20 DIAGNOSIS — R578 Other shock: Secondary | ICD-10-CM | POA: Diagnosis not present

## 2022-05-20 LAB — CBC
HCT: 34.6 % — ABNORMAL LOW (ref 36.0–46.0)
HCT: 29.1 % — ABNORMAL LOW (ref 36.0–46.0)
HCT: 28.8 % — ABNORMAL LOW (ref 36.0–46.0)
HCT: 30.7 % — ABNORMAL LOW (ref 36.0–46.0)
Hemoglobin: 11.7 g/dL — ABNORMAL LOW (ref 12.0–15.0)
Hemoglobin: 10.3 g/dL — ABNORMAL LOW (ref 12.0–15.0)
Hemoglobin: 10.1 g/dL — ABNORMAL LOW (ref 12.0–15.0)
Hemoglobin: 10.3 g/dL — ABNORMAL LOW (ref 12.0–15.0)
MCH: 29.8 pg (ref 26.0–34.0)
MCH: 30.2 pg (ref 26.0–34.0)
MCH: 30.1 pg (ref 26.0–34.0)
MCH: 29.8 pg (ref 26.0–34.0)
MCHC: 33.8 g/dL (ref 30.0–36.0)
MCHC: 35.4 g/dL (ref 30.0–36.0)
MCHC: 35.1 g/dL (ref 30.0–36.0)
MCHC: 33.6 g/dL (ref 30.0–36.0)
MCV: 88.3 fL (ref 80.0–100.0)
MCV: 85.3 fL (ref 80.0–100.0)
MCV: 86 fL (ref 80.0–100.0)
MCV: 88.7 fL (ref 80.0–100.0)
Platelets: 202 10*3/uL (ref 150–400)
Platelets: 215 10*3/uL (ref 150–400)
Platelets: 212 10*3/uL (ref 150–400)
Platelets: 207 10*3/uL (ref 150–400)
RBC: 3.92 MIL/uL (ref 3.87–5.11)
RBC: 3.41 MIL/uL — ABNORMAL LOW (ref 3.87–5.11)
RBC: 3.35 MIL/uL — ABNORMAL LOW (ref 3.87–5.11)
RBC: 3.46 MIL/uL — ABNORMAL LOW (ref 3.87–5.11)
RDW: 17.4 % — ABNORMAL HIGH (ref 11.5–15.5)
RDW: 17.4 % — ABNORMAL HIGH (ref 11.5–15.5)
RDW: 18.4 % — ABNORMAL HIGH (ref 11.5–15.5)
RDW: 19.9 % — ABNORMAL HIGH (ref 11.5–15.5)
WBC: 14.8 10*3/uL — ABNORMAL HIGH (ref 4.0–10.5)
WBC: 12.8 10*3/uL — ABNORMAL HIGH (ref 4.0–10.5)
WBC: 11.8 10*3/uL — ABNORMAL HIGH (ref 4.0–10.5)
WBC: 9.8 10*3/uL (ref 4.0–10.5)
nRBC: 1.9 % — ABNORMAL HIGH (ref 0.0–0.2)
nRBC: 2.7 % — ABNORMAL HIGH (ref 0.0–0.2)
nRBC: 3.6 % — ABNORMAL HIGH (ref 0.0–0.2)
nRBC: 3.7 % — ABNORMAL HIGH (ref 0.0–0.2)

## 2022-05-20 LAB — GLUCOSE, CAPILLARY
Glucose-Capillary: 103 mg/dL — ABNORMAL HIGH (ref 70–99)
Glucose-Capillary: 76 mg/dL (ref 70–99)
Glucose-Capillary: 83 mg/dL (ref 70–99)
Glucose-Capillary: 87 mg/dL (ref 70–99)
Glucose-Capillary: 87 mg/dL (ref 70–99)
Glucose-Capillary: 95 mg/dL (ref 70–99)
Glucose-Capillary: 98 mg/dL (ref 70–99)

## 2022-05-20 LAB — DIC (DISSEMINATED INTRAVASCULAR COAGULATION)PANEL
D-Dimer, Quant: 0.68 ug/mL-FEU — ABNORMAL HIGH (ref 0.00–0.50)
Fibrinogen: 306 mg/dL (ref 210–475)
INR: 1.1 (ref 0.8–1.2)
Platelets: 219 10*3/uL (ref 150–400)
Prothrombin Time: 13.7 seconds (ref 11.4–15.2)
Smear Review: NONE SEEN
aPTT: 27 seconds (ref 24–36)

## 2022-05-20 LAB — BASIC METABOLIC PANEL
Anion gap: 8 (ref 5–15)
Anion gap: 8 (ref 5–15)
BUN: 19 mg/dL (ref 6–20)
BUN: 16 mg/dL (ref 6–20)
CO2: 22 mmol/L (ref 22–32)
CO2: 22 mmol/L (ref 22–32)
Calcium: 7.6 mg/dL — ABNORMAL LOW (ref 8.9–10.3)
Calcium: 7.4 mg/dL — ABNORMAL LOW (ref 8.9–10.3)
Chloride: 103 mmol/L (ref 98–111)
Chloride: 103 mmol/L (ref 98–111)
Creatinine, Ser: 0.67 mg/dL (ref 0.44–1.00)
Creatinine, Ser: 0.67 mg/dL (ref 0.44–1.00)
GFR, Estimated: >60 mL/min (ref >60)
GFR, Estimated: >60 mL/min (ref >60)
Glucose, Bld: 77 mg/dL (ref 70–99)
Glucose, Bld: 94 mg/dL (ref 70–99)
Potassium: 3.1 mmol/L — ABNORMAL LOW (ref 3.5–5.1)
Potassium: 3.1 mmol/L — ABNORMAL LOW (ref 3.5–5.1)
Sodium: 133 mmol/L — ABNORMAL LOW (ref 135–145)
Sodium: 133 mmol/L — ABNORMAL LOW (ref 135–145)

## 2022-05-20 LAB — PROCALCITONIN: Procalcitonin: 0.3 ng/mL

## 2022-05-20 LAB — CBC WITH DIFFERENTIAL/PLATELET
Basophils Relative: 1 %
Eosinophils Relative: 0 %
Lymphocytes Relative: 13 %
Monocytes Relative: 0 %
Neutrophils Relative %: 86 %
Smear Review: UNDETERMINED
nRBC: 1 /100 WBC — ABNORMAL HIGH

## 2022-05-20 LAB — MAGNESIUM: Magnesium: 1.5 mg/dL — ABNORMAL LOW (ref 1.7–2.4)

## 2022-05-20 LAB — RESP PANEL BY RT-PCR (RSV, FLU A&B, COVID)  RVPGX2
Influenza A by PCR: NEGATIVE
Influenza B by PCR: NEGATIVE
Resp Syncytial Virus by PCR: NEGATIVE
SARS Coronavirus 2 by RT PCR: NEGATIVE

## 2022-05-20 LAB — PROTIME-INR
INR: 1.1 (ref 0.8–1.2)
Prothrombin Time: 13.6 seconds (ref 11.4–15.2)

## 2022-05-20 LAB — PREPARE RBC (CROSSMATCH)

## 2022-05-20 LAB — MRSA NEXT GEN BY PCR, NASAL: MRSA by PCR Next Gen: NOT DETECTED

## 2022-05-20 LAB — PHOSPHORUS: Phosphorus: 2.2 mg/dL — ABNORMAL LOW (ref 2.5–4.6)

## 2022-05-20 LAB — LACTIC ACID, PLASMA
Lactic Acid, Venous: 2.1 mmol/L (ref 0.5–1.9)
Lactic Acid, Venous: 0.7 mmol/L (ref 0.5–1.9)

## 2022-05-20 MED ORDER — SODIUM CHLORIDE 0.9 % IV SOLN: INTRAVENOUS | Status: DC

## 2022-05-20 MED ORDER — PHENOL 1.4 % MT LIQD
1.0000 | OROMUCOSAL | Status: DC | PRN
  Administered 2022-05-20 – 2022-05-22 (×6): 1 via OROMUCOSAL
  Filled 2022-05-20 (×2): qty 177

## 2022-05-20 MED ORDER — CHLORHEXIDINE GLUCONATE CLOTH 2 % EX PADS
6.0000 | MEDICATED_PAD | Freq: Every day | CUTANEOUS | Status: DC
  Administered 2022-05-20 – 2022-05-22 (×3): 6 via TOPICAL

## 2022-05-20 MED ORDER — LEVOTHYROXINE SODIUM 75 MCG PO TABS: 75.0000 ug | ORAL_TABLET | Freq: Every day | ORAL | Status: DC

## 2022-05-20 MED ORDER — MAGNESIUM SULFATE 4 GM/100ML IV SOLN
4.0000 g | Freq: Once | INTRAVENOUS | Status: AC
  Administered 2022-05-20: 4 g via INTRAVENOUS
  Filled 2022-05-20: qty 100

## 2022-05-20 MED ORDER — POTASSIUM PHOSPHATES 15 MMOLE/5ML IV SOLN
30.0000 mmol | Freq: Once | INTRAVENOUS | Status: AC
  Administered 2022-05-20: 30 mmol via INTRAVENOUS
  Filled 2022-05-20: qty 10

## 2022-05-20 MED ORDER — SODIUM CHLORIDE 0.9% IV SOLUTION: Freq: Once | INTRAVENOUS | Status: DC

## 2022-05-20 MED ORDER — POTASSIUM CHLORIDE 10 MEQ/100ML IV SOLN
10.0000 meq | INTRAVENOUS | Status: AC
  Administered 2022-05-20 (×6): 10 meq via INTRAVENOUS
  Filled 2022-05-20 (×6): qty 100

## 2022-05-20 MED ORDER — LEVOTHYROXINE SODIUM 75 MCG PO TABS
75.0000 ug | ORAL_TABLET | Freq: Every day | ORAL | Status: DC
  Administered 2022-05-21 – 2022-05-23 (×3): 75 ug
  Filled 2022-05-20 (×3): qty 1

## 2022-05-20 MED ORDER — ORAL CARE MOUTH RINSE: 15.0000 mL | OROMUCOSAL | Status: DC | PRN

## 2022-05-20 MED ORDER — ACETAMINOPHEN 10 MG/ML IV SOLN: 1000.0000 mg | Freq: Once | INTRAVENOUS | Status: DC

## 2022-05-20 MED ORDER — THIAMINE HCL 100 MG/ML IJ SOLN: 100.0000 mg | Freq: Every day | INTRAMUSCULAR | Status: DC

## 2022-05-20 MED ORDER — ADULT MULTIVITAMIN W/MINERALS CH
1.0000 | ORAL_TABLET | Freq: Every day | ORAL | Status: DC
  Administered 2022-05-20 – 2022-05-23 (×4): 1
  Filled 2022-05-20 (×4): qty 1

## 2022-05-20 MED ORDER — ADULT MULTIVITAMIN W/MINERALS CH: 1.0000 | ORAL_TABLET | Freq: Every day | ORAL | Status: DC

## 2022-05-20 MED ORDER — ACETAMINOPHEN 325 MG PO TABS
650.0000 mg | ORAL_TABLET | Freq: Four times a day (QID) | ORAL | Status: DC | PRN
  Administered 2022-05-20 – 2022-05-21 (×4): 650 mg
  Filled 2022-05-20 (×4): qty 2

## 2022-05-20 MED ORDER — THIAMINE MONONITRATE 100 MG PO TABS: 100.0000 mg | ORAL_TABLET | Freq: Every day | ORAL | Status: DC

## 2022-05-20 MED ORDER — THIAMINE MONONITRATE 100 MG PO TABS
100.0000 mg | ORAL_TABLET | Freq: Every day | ORAL | Status: DC
  Administered 2022-05-20 – 2022-05-23 (×4): 100 mg
  Filled 2022-05-20 (×4): qty 1

## 2022-05-20 MED ORDER — DIPHENHYDRAMINE HCL 50 MG/ML IJ SOLN
25.0000 mg | Freq: Once | INTRAMUSCULAR | Status: AC | PRN
  Administered 2022-05-20: 25 mg via INTRAVENOUS
  Filled 2022-05-20: qty 1

## 2022-05-20 MED ORDER — FOLIC ACID 1 MG PO TABS: 1.0000 mg | ORAL_TABLET | Freq: Every day | ORAL | Status: DC

## 2022-05-20 MED ORDER — LEVOTHYROXINE SODIUM 75 MCG PO TABS
75.0000 ug | ORAL_TABLET | Freq: Every day | ORAL | Status: DC
  Administered 2022-05-20: 75 ug
  Filled 2022-05-20: qty 1

## 2022-05-20 MED ORDER — SODIUM CHLORIDE 0.9 % IV SOLN: INTRAVENOUS | Status: DC | PRN

## 2022-05-20 MED ORDER — LORAZEPAM 2 MG/ML IJ SOLN: 1.0000 mg | INTRAMUSCULAR | Status: AC | PRN

## 2022-05-20 MED ORDER — FOLIC ACID 1 MG PO TABS
1.0000 mg | ORAL_TABLET | Freq: Every day | ORAL | Status: DC
  Administered 2022-05-20 – 2022-05-23 (×4): 1 mg
  Filled 2022-05-20 (×4): qty 1

## 2022-05-20 MED ORDER — LORAZEPAM 1 MG PO TABS: 1.0000 mg | ORAL_TABLET | ORAL | Status: AC | PRN

## 2022-05-20 NOTE — Progress Notes (Signed)
[facility] Progress Note Patient Name: Crystal Spence DOB: December 28, 1975 MRN: [id number]   Date of Service  05/20/2022  HPI/Events of Note  Patient is requesting benadryl for itching and chloraseptic spray.    NPO for EGD tomorrow. IV only  eICU Interventions  Benadryl 25 IV x 1 and chloraseptic spray ordered     Intervention Category Minor Interventions: Routine modifications to care plan (e.g. PRN medications for pain, fever)  Crystal Spence 05/20/2022, 9:13 PM

## 2022-05-20 NOTE — Progress Notes (Signed)
Pt received from ED. 1 unit of PRBCs currently transfusing. Personal items brought with patient include pants and shirt.

## 2022-05-20 NOTE — H&P (View-Only) (Signed)
[facility] Consult  Referring Provider: Critical care/pulmonary medicine Primary Care Physician:  Trey Sailors, PA Primary Gastroenterologist: Dr. Jessie [facility]  Reason for Consultation: Black tarry stools, hypotension, suspected upper GI bleed  HPI: Crystal Spence is a 47 y.o. female with history of laryngeal cancer, status postradiation and chemotherapy, oropharyngeal dysphagia with malnutrition requiring G-tube placement by IR on 05/15/2022 came to the ED with altered mental status.  Some of the history is obtained from the patient, when most of the history is obtained from her daughter and her niece present at bedside.  It seems patient appeared confused and lethargic and had only been released from the rehab facility 2 days ago after being treated for seizures.  On admission patient was found to be hypotensive with a blood pressure of 70/40, tachycardic, heart rate 110, CBC unreadable but on i-STAT hematocrit was less than 15, FOBT was positive. She was given 4 units of PRBC, 1 unit FFP and 1 unit platelet and admitted to ICU.  Patient reports having liquid black stools for the last several days, she takes aspirin 81 mg at home and denies use of other antiplatelets or NSAIDs. Patient reports having history of peptic ulcer disease in the past, is not on any PPI, denies acid reflux or heartburn. Although her G-tube was placed due to concerns of oropharyngeal dysphagia and malnutrition, patient states she has no problems swallowing.  As per her daughter and niece she appears to be at high risk of aspiration and that is why G-tube was placed.  Prior GI workup EGD 01/2018, coffee-ground emesis, melena: 2 large, 2 cm prepyloric channel ulcers Colonoscopy, 10/2015, iron deficiency anemia: Normal, repeat recommended in 10 years EGD, 10/2015, iron deficiency anemia: Normal EGD 01/2013, Dr. 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[facility])     Past Surgical History:  Procedure Laterality Date   ABDOMINAL SURGERY     BIOPSY  01/18/2018   Procedure: BIOPSY;  Surgeon: Wonda Horner, MD;  Location: [facility];  Service: Endoscopy;;   COLONOSCOPY WITH PROPOFOL N/A 10/12/2015   Procedure: COLONOSCOPY WITH PROPOFOL;  Surgeon: Wonda Horner, MD;  Location: [facility]COPY;  Service: Endoscopy;  Laterality: N/A;   ESOPHAGOGASTRODUODENOSCOPY N/A 02/03/2013   Procedure: ESOPHAGOGASTRODUODENOSCOPY (EGD);  Surgeon: Juanita Craver, MD;  Location: [facility] ENDOSCOPY;  Service: Endoscopy;  Laterality: N/A;   ESOPHAGOGASTRODUODENOSCOPY N/A 10/12/2015   Procedure: ESOPHAGOGASTRODUODENOSCOPY (EGD);  Surgeon: Wonda Horner, MD;  Location: [facility];  Service: Endoscopy;  Laterality: N/A;   ESOPHAGOGASTRODUODENOSCOPY (EGD) WITH PROPOFOL N/A 01/18/2018   Procedure: ESOPHAGOGASTRODUODENOSCOPY (EGD) WITH PROPOFOL;  Surgeon: Wonda Horner, MD;  Location[facility];  Service: Endoscopy;  Laterality: N/A;   IR GASTROSTOMY TUBE MOD SED  05/15/2022   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 Authorizing Provider  acetaminophen (  TYLENOL) 500 MG tablet Take 500-1,000 mg by mouth every 6 (six) hours as needed for mild pain or headache.   Yes [provider]  diltiazem (CARDIZEM) 30 MG tablet Place 1 tablet (30 mg total) into feeding tube 2 (two) times daily. 05/17/22  11/08/23 Yes Bonnielee Haff, MD  levETIRAcetam (KEPPRA) 1000 MG tablet Place 1 tablet (1,000 mg total) into feeding tube 2 (two) times daily. 05/17/22  Yes Bonnielee Haff, MD  levothyroxine (SYNTHROID) 75 MCG tablet Place 1 tablet (75 mcg total) into feeding tube daily before breakfast. 05/18/22  Yes Bonnielee Haff, MD  Nutritional Supplements (FEEDING SUPPLEMENT, OSMOLITE 1.2 CAL,) LIQD Place 355 mLs into feeding tube 4 (four) times daily - after meals and at bedtime. 05/17/22  Yes Bonnielee Haff, MD  polyethylene glycol (MIRALAX / GLYCOLAX) 17 g packet Place 17 g into feeding tube daily as needed. Patient taking differently: Place 17 g into feeding tube daily as needed for mild constipation or moderate constipation. 05/17/22  Yes Bonnielee Haff, MD  senna-docusate (SENOKOT-S) 8.6-50 MG tablet Place 2 tablets into feeding tube at bedtime. 05/17/22  Yes Bonnielee Haff, MD  traZODone (DESYREL) 100 MG tablet Take 100 mg by mouth at bedtime as needed for sleep.   Yes [provider]  lacosamide 100 MG TABS Place 1 tablet (100 mg total) into feeding tube 2 (two) times daily. 05/17/22   Bonnielee Haff, MD  Water For Irrigation, Sterile (FREE WATER) SOLN Place 100 mLs into feeding tube every 6 (six) hours. 05/17/22   Bonnielee Haff, MD    Current Facility-Administered Medications  Medication Dose Route Frequency Provider Last Rate Last Admin   0.9 %  sodium chloride infusion   Intravenous PRN Margaretha Seeds, MD   Stopped at 05/20/22 1316   acetaminophen (TYLENOL) tablet 650 mg  650 mg Per Tube Q6H PRN Margaretmary Lombard, MD   650 mg at 05/20/22 1318   Chlorhexidine Gluconate Cloth 2 % PADS 6 each  6 each Topical Q2000 Margaretha Seeds, MD       docusate (COLACE) 50 MG/5ML liquid 100 mg  100 mg Per Tube BID PRN Cristal Generous, NP       folic acid (FOLVITE) tablet 1 mg  1 mg Per Tube Daily Margaretha Seeds, MD   1 mg at 05/20/22 1318   lacosamide (VIMPAT) 100 mg in sodium chloride 0.9 % 25 mL IVPB   100 mg Intravenous Q12H Bowser, Laurel Dimmer, NP   Stopped at 05/20/22 1028   levETIRAcetam (KEPPRA) IVPB 1000 mg/100 mL premix  1,000 mg Intravenous Q12H Cristal Generous, NP   Stopped at 05/20/22 0825   [START ON 05/21/2022] levothyroxine (SYNTHROID) tablet 75 mcg  75 mcg Per Tube Q0600 Margaretha Seeds, MD       LORazepam (ATIVAN) tablet 1-4 mg  1-4 mg Oral Q1H PRN Angelique Blonder, DO       Or   LORazepam (ATIVAN) injection 1-4 mg  1-4 mg Intravenous Q1H PRN Angelique Blonder, DO       multivitamin with minerals tablet 1 tablet  1 tablet Per Tube Daily Margaretha Seeds, MD   1 tablet at 05/20/22 1318   ondansetron (ZOFRAN) injection 4 mg  4 mg Intravenous Q6H PRN Bowser, Laurel Dimmer, NP       pantoprazole (PROTONIX) injection 40 mg  40 mg Intravenous Q12H Cristal Generous, NP   40 mg at 05/20/22 0958   polyethylene glycol (MIRALAX / GLYCOLAX) packet 17 g  17 g Per Tube Daily PRN Bowser, Laurel Dimmer, NP       potassium PHOSPHATE 30 mmol in dextrose 5 % 500 mL infusion  30 mmol Intravenous Once Bell, Lorin C, RPH 85 mL/hr at 05/20/22 1334 Infusion Verify at 05/20/22 1334   thiamine (VITAMIN B1) tablet 100 mg  100 mg Per Tube Daily Margaretha Seeds, MD   100 mg at 05/20/22 1318   Or   thiamine (VITAMIN B1) injection 100 mg  100 mg Intravenous Daily Margaretha Seeds, MD        Allergies as of 05/19/2022   (No Known Allergies)    Family History  Problem Relation Age of Onset   Cancer Maternal Grandmother        skin cancer    Social History   Socioeconomic History   Marital status: Single    Spouse name: Not on file   Number of children: Not on file   Years of education: Not on file   Highest education level: Not on file  Occupational History   Not on file  Tobacco Use   Smoking status: Former    Packs/day: 1.00    Years: 15.00    Total pack years: 15.00    Types: Cigarettes    Quit date: 09/11/2010    Years since quitting: 11.6   Smokeless tobacco: Never  Vaping Use   Vaping Use: Never used   Substance and Sexual Activity   Alcohol use: Yes    Alcohol/week: 2.0 standard drinks of alcohol    Types: 2 Cans of beer per week    Comment: rare   Drug use: No    Comment: occasional   Sexual activity: Never  Other Topics Concern   Not on file  Social History Narrative   Not on file   Social Determinants of Health   Financial Resource Strain: Medium Risk (01/17/2018)   Overall Financial Resource Strain (CARDIA)    Difficulty of Paying Living Expenses: Somewhat hard  Food Insecurity: No Food Insecurity (05/01/2022)   Hunger Vital Sign    Worried About Running Out of Food in the Last Year: Never true    Ran Out of Food in the Last Year: Never true  Transportation Needs: No Transportation Needs (05/01/2022)   PRAPARE - Hydrologist (Medical): No    Lack of Transportation (Non-Medical): No  Physical Activity: Not on file  Stress: Not on file  Social Connections: Not on file  Intimate Partner Violence: Not At Risk (05/01/2022)   Humiliation, Afraid, Rape, and Kick questionnaire    Fear of Current or Ex-Partner: No    Emotionally Abused: No    Physically Abused: No    Sexually Abused: No    Review of Systems: Positive for: GI: Described in detail in HPI.    Gen: Denies any fever, chills, rigors, night sweats, anorexia, fatigue, weakness, malaise, involuntary weight loss, and sleep disorder CV: Denies chest pain, angina, palpitations, syncope, orthopnea, PND, peripheral edema, and claudication. Resp:Recent influenza infection(04/29/22) GU : Denies urinary burning, blood in urine, urinary frequency, urinary hesitancy, nocturnal urination, and urinary incontinence. MS: Denies joint pain or swelling.  Denies muscle weakness, cramps, atrophy.  Derm: Denies rash, itching, oral ulcerations, hives, unhealing ulcers.  Psych: Denies depression, anxiety, memory loss, suicidal ideation, hallucinations,  and confusion. Heme: Denies bruising and enlarged lymph  nodes. Neuro:  History of seizures. Endo:   History of hypothyroidism, denies any problems with DM,, adrenal function.  Physical Exam: Vital signs in last 24 hours: Temp:  [97.6 F (36.4 C)-98.5 F (36.9 C)] 98.3 F (36.8 C) (01/08 1134) Pulse Rate:  [87-119] 89 (01/08 1300) Resp:  [13-29] 18 (01/08 1300) BP: (78-147)/(45-105) 116/82 (01/08 1300) SpO2:  [93 %-100 %] 100 % (01/08 1300) Weight:  [40.2 kg] 40.2 kg (01/08 0500) Last BM Date :  (PTA)  General:   Alert,  Well-developed, thin, pleasant and cooperative in NAD Head:  Normocephalic and atraumatic. Eyes:  Sclera clear, no icterus, mild pallor Ears:  Normal auditory acuity. Nose:  No deformity, discharge,  or lesions. Mouth:  No deformity or lesions.  Oropharynx pink & moist. Neck:  Supple; no masses or thyromegaly. Lungs:  Clear throughout to auscultation.   No wheezes, crackles, or rhonchi. No acute distress. Heart:  Regular rate and rhythm; no murmurs, clicks, rubs,  or gallops. Extremities:  Without clubbing or edema. Neurologic:  Alert and  oriented x4;  grossly normal neurologically. Skin:  Intact without significant lesions or rashes. Psych:  Alert and cooperative. Normal mood and affect. Abdomen:  20 Fr gastrostomy tube in place, non tender   Lab Results: Recent Labs    05/20/22 0420 05/20/22 0807 05/20/22 1344  WBC 14.8* 12.8* PENDING  HGB 11.7* 10.3* 10.1*  HCT 34.6* 29.1* 28.8*  PLT 219  202 215 212   BMET Recent Labs    05/19/22 2113 05/20/22 0421 05/20/22 0807  NA 130* 133* 133*  K 3.4* 3.1* 3.1*  CL 102 103 103  CO2 '22 22 22  '$ GLUCOSE 101* 77 94  BUN 23* 19 16  CREATININE 0.71 0.67 0.67  CALCIUM 7.8* 7.6* 7.4*   LFT Recent Labs    05/19/22 2113  PROT 4.4*  ALBUMIN 2.1*  AST 36  ALT 19  ALKPHOS 65  BILITOT 0.3   PT/INR Recent Labs    05/20/22 0420 05/20/22 0807  LABPROT 13.7 13.6  INR 1.1 1.1    Studies/Results: CT Head Wo Contrast  Result Date: 05/19/2022 CLINICAL  DATA:  Mental status change, unknown cause. History of seizure. EXAM: CT HEAD WITHOUT CONTRAST TECHNIQUE: Contiguous axial images were obtained from the base of the skull through the vertex without intravenous contrast. RADIATION DOSE REDUCTION: This exam was performed according to the departmental dose-optimization program which includes automated exposure control, adjustment of the mA and/or kV according to patient size and/or use of iterative reconstruction technique. COMPARISON:  04/30/2022, 05/05/2022. FINDINGS: Brain: No acute intracranial hemorrhage, midline shift or mass effect. No extra-axial fluid collection. Mild periventricular white matter hypodensities are seen bilaterally. No hydrocephalus. Vascular: No hyperdense vessel or unexpected calcification. Skull: Normal. Negative for fracture or focal lesion. Sinuses/Orbits: No acute finding. Other: None. IMPRESSION: 1. No acute intracranial process. 2. Periventricular white matter hypodensities bilaterally, possible chronic microvascular ischemic changes and not significantly changed from the prior exam. Electronically Signed   By: Brett Fairy M.D.   On: 05/19/2022 21:40   CT Abdomen Pelvis W Contrast  Result Date: 05/19/2022 CLINICAL DATA:  Sepsis. Altered mental status and weakness. History of seizures. EXAM: CT ABDOMEN AND PELVIS WITH CONTRAST TECHNIQUE: Multidetector CT imaging of the abdomen and pelvis was performed using the standard protocol following bolus administration of intravenous contrast. RADIATION DOSE REDUCTION: This exam was performed according to the departmental dose-optimization program which includes automated exposure control, adjustment of the mA and/or kV according to patient size and/or use of iterative reconstruction technique. CONTRAST:  57m OMNIPAQUE IOHEXOL 350 MG/ML SOLN COMPARISON:  12/31/2021. FINDINGS:  Lower chest: Mild patchy infiltrates are noted at the lung bases. Scattered calcified granuloma are present at the  lung bases. Hepatobiliary: No focal liver abnormality is seen. Stones are present within the gallbladder. No biliary ductal dilatation. Pancreas: Unremarkable. No pancreatic ductal dilatation or surrounding inflammatory changes. Spleen: Normal in size without focal abnormality. Adrenals/Urinary Tract: The adrenal glands are within normal limits. The kidneys enhance symmetrically. There is a cyst in the lower pole of the right kidney. A subcentimeter hypodensity is present in the mid left kidney which is too small to further characterize. No renal calculus or hydronephrosis. The bladder is unremarkable. Stomach/Bowel: A PEG tube is noted in the stomach. Appendix is not seen. No evidence of bowel wall thickening, distention, or inflammatory changes. No free air or pneumatosis. Vascular/Lymphatic: Aortic atherosclerosis. No enlarged abdominal or pelvic lymph nodes. Reproductive: Uterus and bilateral adnexa are unremarkable. Other: No abdominopelvic ascites. Musculoskeletal: Degenerative changes in the thoracolumbar spine. No acute or suspicious osseous abnormality. IMPRESSION: 1. Mild patchy infiltrates at the lung bases, suspicious for pneumonia. 2. Cholelithiasis. 3. Aortic atherosclerosis. Electronically Signed   By: Brett Fairy M.D.   On: 05/19/2022 21:35   DG Chest Port 1 View  Result Date: 05/19/2022 CLINICAL DATA:  Questionable sepsis EXAM: PORTABLE CHEST 1 VIEW COMPARISON:  Chest x-ray 05/06/2022 FINDINGS: Bilateral calcified granulomas are unchanged. There is no new focal lung infiltrate, pleural effusion or pneumothorax. Cardiomediastinal silhouette is within normal limits. There is stable scoliosis of the thoracic spine. There is a healed right sixth rib fracture. IMPRESSION: No active disease. Electronically Signed   By: Ronney Asters M.D.   On: 05/19/2022 20:34    Impression: Several days of black tarry stool, melena, hematocrit less than 15 on i-STAT on admission status post 4 unit PRBC, 1 unit  FFP and 1 unit platelet transfusion  Hemoglobin now stable at 11.7/10.3/10.1 Recent IR guided gastrostomy tube placement on 05/15/2022 Elevated BUN admission of 23 with a normal renal function otherwise suspicious for an upper GI bleed  Plan: EGD in a.m. for further evaluation Since feeding tube was placed in because of risk of aspiration, recommend keeping patient n.p.o. but okay to use feeding tube for meds, discussed the same with the patient's nurse. Continue Protonix 40 mg IV every 12 hours until endoscopic evaluation has been performed. The risks and the benefits of the procedure were discussed with the patient in details along with the daughter and niece at bedside. They understand and verbalized consent.   LOS: 1 day   Ronnette Juniper, MD  05/20/2022, 2:42 PM

## 2022-05-20 NOTE — Progress Notes (Signed)
[facility] ADULT ICU REPLACEMENT PROTOCOL   The patient does apply for the [facility] Adult ICU Electrolyte Replacment Protocol based on the criteria listed below:   1.Exclusion criteria: TCTS, ECMO, Dialysis, and Myasthenia Gravis patients 2. Is GFR >/= 30 ml/min? Yes.    Patient's GFR today is >60 3. Is SCr </= 2? Yes.   Patient's SCr is 0.67 mg/dL 4. Did SCr increase >/= 0.5 in 24 hours? No. 5.Pt's weight >40kg  Yes.   6. Abnormal electrolyte(s): K  7. Electrolytes replaced per protocol 8.  Call MD STAT for K+ </= 2.5, Phos </= 1, or Mag </= 1 Physician:  Elisabeth Cara [facility] 05/20/2022 6:13 AM

## 2022-05-20 NOTE — Progress Notes (Signed)
   NAME:  Crystal Spence, MRN:  [id number], DOB:  05-27-1975, LOS: 1 ADMISSION DATE:  05/19/2022, CONSULTATION DATE:  05/19/22 REFERRING MD:  Tyrone Nine - EM, CHIEF COMPLAINT:  AMS  History of Present Illness:  47 yo F PMH seizure disorder, laryngeal ca s/p radiation with resultant oropharyngeal dysphagia and related malnutrition requiring G tube, previous gastric ulcers (2019, neg for Hpylori), recent Flu PNA, hypothyroidism, presents to ED 1/8 [place] with AMS. Has had dark tarry stools for about 2 days. Was hypotensive with EMS and received 540m NS. In ED rcvd additional 1L IVF, as well as empiric abx for possible sepsis.   CMP with minor lyte abnormalities but normal renal and hepatic fxn. CBC however was "unreliable"-- presumably undetectably low values based on a POC read which revealed a HCT < 15 and an undetectably low Hgb. CT H WNL. CT a/p with con did not reveal an acute process to explain.   Receiving 2 PRBC in ED.   PCCM consulted for admission in this setting   Pertinent  Medical History  Seizure disorder Laryngeal cancer Oropharyngeal dysphagia Severe protein calorie malnutrition Hypothyroidism HTN Gastric ulcers   Significant Hospital Events: Including procedures, antibiotic start and stop dates in addition to other pertinent events   1/8 ED from hotel with weakness, AMS. Undetectably low Hgb HCT. 2 PRBC, admitting to ICU. Add/' 2 PRBC 1 plt 1 FFP   Interim History / Subjective:  Alert and awake this morning. Endorses feeling weak. Denies lightheaded, dizzy, n/v.   Objective   Blood pressure 131/88, pulse 96, temperature 98.5 F (36.9 C), temperature source Oral, resp. rate 15, weight 40.2 kg, last menstrual period 10/20/2012, SpO2 100 %.        Intake/Output Summary (Last 24 hours) at 05/20/2022 0745 Last data filed at 05/20/2022 0600 Gross per 24 hour  Intake 5533.85 ml  Output 500 ml  Net 5033.85 ml   Filed Weights   05/20/22 0500  Weight: 40.2 kg     Examination: General: thin appearing female, in no acute distress HENT: pale conjunctiva, tongue is pink coloration Lungs: unlabored breathing on RA, anterior lung sounds clear  Cardiovascular: RRR Abdomen: soft, non-tender, non-distended Extremities: warm and dry Neuro: awake and alert, follows commands  Resolved Hospital Problem list     Assessment & Plan:  Acute encephalopathy due to shock Hemorrhagic shock due to presumed GIB ABLA, severe  Hx of gastric ulcers IDA EGD in 2019 showed non-bleeding ulcer. Colonoscopy done in 2017 was normal. DIC panel negative. Normal smear.  -Hgb 10.3 after 4 units PRBC, 1 unit PLT, 1 unit FFP -GI consult today -trend CBC q6 -transfuse if Hgb <7 -Protonix 40 mg IV BID  Seizure disorder -continue Keppra and Vimpat   Lactic acidosis Hyponatremia Hypokalemia Hypomagnesemia Hypophosphatemia  LA 2.4>2.1. In setting of severe blood loss. Replete electrolytes as needed.  -recheck BMP, mag, phos  Hypothyroidism  -restart home levothyroxine -follow up with TSH in outpatient setting  Hx HTN Presented initially hypotensive. Holding home meds right now.   Oropharyngeal dysphagia Severe protein calorie malnutrition G tube placed at previous hospitalization.    Best Practice (right click and "Reselect all SmartList Selections" daily)   Diet/type: NPO DVT prophylaxis: not indicated GI prophylaxis: PPI Lines: N/A Foley:  N/A Code Status:  full code Last date of multidisciplinary goals of care discussion [pending]   Critical care time:      MAngelique Blonder DO

## 2022-05-20 NOTE — Progress Notes (Addendum)
[facility] Progress Note Patient Name: Crystal Spence DOB: [birth year] MRN: [id number]   Date of Service  05/20/2022  HPI/Events of Note  Admitted to ICU with GI bleed. Current HR is 100. O2 sat 100 on nasal o2. MAP 95. RR 16. No distress. RN notes she looks better, more awake and alert as well. Is npo and getting her 2nd unit RBC, and platelets. Has 2 more units RBC and 1 FFP also ordered and serial labs.   eICU Interventions  NPE done, plan per CCM. D/w RN let us know when labs result.      Intervention Category Major Interventions: Hemorrhage - evaluation and management Evaluation Type: New Patient Evaluation  Crystal Spence 05/20/2022, 1:10 AM  Addendum at 2:25 am  Request for tylenol that she takes at home for chronic back pain and request for foley while getting resuscitated Orders placed

## 2022-05-20 NOTE — Consult Note (Signed)
[facility] Consult  Referring Provider: Critical care/pulmonary medicine Primary Care Physician:  Trey Sailors, PA Primary Gastroenterologist: Dr. Jessie [facility]  Reason for Consultation: Black tarry stools, hypotension, suspected upper GI bleed  HPI: Crystal Spence is a 47 y.o. female with history of laryngeal cancer, status postradiation and chemotherapy, oropharyngeal dysphagia with malnutrition requiring G-tube placement by IR on 05/15/2022 came to the ED with altered mental status.  Some of the history is obtained from the patient, when most of the history is obtained from her daughter and her niece present at bedside.  It seems patient appeared confused and lethargic and had only been released from the rehab facility 2 days ago after being treated for seizures.  On admission patient was found to be hypotensive with a blood pressure of 70/40, tachycardic, heart rate 110, CBC unreadable but on i-STAT hematocrit was less than 15, FOBT was positive. She was given 4 units of PRBC, 1 unit FFP and 1 unit platelet and admitted to ICU.  Patient reports having liquid black stools for the last several days, she takes aspirin 81 mg at home and denies use of other antiplatelets or NSAIDs. Patient reports having history of peptic ulcer disease in the past, is not on any PPI, denies acid reflux or heartburn. Although her G-tube was placed due to concerns of oropharyngeal dysphagia and malnutrition, patient states she has no problems swallowing.  As per her daughter and niece she appears to be at high risk of aspiration and that is why G-tube was placed.  Prior GI workup EGD 01/2018, coffee-ground emesis, melena: 2 large, 2 cm prepyloric channel ulcers Colonoscopy, 10/2015, iron deficiency anemia: Normal, repeat recommended in 10 years EGD, 10/2015, iron deficiency anemia: Normal EGD 01/2013, Dr. 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[facility])     Past Surgical History:  Procedure Laterality Date   ABDOMINAL SURGERY     BIOPSY  01/18/2018   Procedure: BIOPSY;  Surgeon: Wonda Horner, MD;  Location: [facility];  Service: Endoscopy;;   COLONOSCOPY WITH PROPOFOL N/A 10/12/2015   Procedure: COLONOSCOPY WITH PROPOFOL;  Surgeon: Wonda Horner, MD;  Location: [facility] ENDOSCOPY;  Service: Endoscopy;  Laterality: N/A;   ESOPHAGOGASTRODUODENOSCOPY N/A 02/03/2013   Procedure: ESOPHAGOGASTRODUODENOSCOPY (EGD);  Surgeon: Juanita Craver, MD;  Location: [facility] ENDOSCOPY;  Service: Endoscopy;  Laterality: N/A;   ESOPHAGOGASTRODUODENOSCOPY N/A 10/12/2015   Procedure: ESOPHAGOGASTRODUODENOSCOPY (EGD);  Surgeon: Wonda Horner, MD;  Location: [facility];  Service: Endoscopy;  Laterality: N/A;   ESOPHAGOGASTRODUODENOSCOPY (EGD) WITH PROPOFOL N/A 01/18/2018   Procedure: ESOPHAGOGASTRODUODENOSCOPY (EGD) WITH PROPOFOL;  Surgeon: Wonda Horner, MD;  Location: [facility] ENDOSCOPY;  Service: Endoscopy;  Laterality: N/A;   IR GASTROSTOMY TUBE MOD SED  05/15/2022   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   Procedure: TRACHEOCUTANEOUS FISTULA REPAIR;  Surgeon: Izora Gala, MD;  Location: [facility];  Service: ENT;  Laterality: N/A;   TRACHEOSTOMY     for throat inflammation from radiation   TUBAL LIGATION      Prior to Admission medications   Medication Sig Start Date End Date Taking? Authorizing Provider  acetaminophen (  TYLENOL) 500 MG tablet Take 500-1,000 mg by mouth every 6 (six) hours as needed for mild pain or headache.   Yes [provider]  diltiazem (CARDIZEM) 30 MG tablet Place 1 tablet (30 mg total) into feeding tube 2 (two) times daily. 05/17/22  11/08/23 Yes Bonnielee Haff, MD  levETIRAcetam (KEPPRA) 1000 MG tablet Place 1 tablet (1,000 mg total) into feeding tube 2 (two) times daily. 05/17/22  Yes Bonnielee Haff, MD  levothyroxine (SYNTHROID) 75 MCG tablet Place 1 tablet (75 mcg total) into feeding tube daily before breakfast. 05/18/22  Yes Bonnielee Haff, MD  Nutritional Supplements (FEEDING SUPPLEMENT, OSMOLITE 1.2 CAL,) LIQD Place 355 mLs into feeding tube 4 (four) times daily - after meals and at bedtime. 05/17/22  Yes Bonnielee Haff, MD  polyethylene glycol (MIRALAX / GLYCOLAX) 17 g packet Place 17 g into feeding tube daily as needed. Patient taking differently: Place 17 g into feeding tube daily as needed for mild constipation or moderate constipation. 05/17/22  Yes Bonnielee Haff, MD  senna-docusate (SENOKOT-S) 8.6-50 MG tablet Place 2 tablets into feeding tube at bedtime. 05/17/22  Yes Bonnielee Haff, MD  traZODone (DESYREL) 100 MG tablet Take 100 mg by mouth at bedtime as needed for sleep.   Yes [provider]  lacosamide 100 MG TABS Place 1 tablet (100 mg total) into feeding tube 2 (two) times daily. 05/17/22   Bonnielee Haff, MD  Water For Irrigation, Sterile (FREE WATER) SOLN Place 100 mLs into feeding tube every 6 (six) hours. 05/17/22   Bonnielee Haff, MD    Current Facility-Administered Medications  Medication Dose Route Frequency Provider Last Rate Last Admin   0.9 %  sodium chloride infusion   Intravenous PRN Margaretha Seeds, MD   Stopped at 05/20/22 1316   acetaminophen (TYLENOL) tablet 650 mg  650 mg Per Tube Q6H PRN Margaretmary Lombard, MD   650 mg at 05/20/22 1318   Chlorhexidine Gluconate Cloth 2 % PADS 6 each  6 each Topical Q2000 Margaretha Seeds, MD       docusate (COLACE) 50 MG/5ML liquid 100 mg  100 mg Per Tube BID PRN Cristal Generous, NP       folic acid (FOLVITE) tablet 1 mg  1 mg Per Tube Daily Margaretha Seeds, MD   1 mg at 05/20/22 1318   lacosamide (VIMPAT) 100 mg in sodium chloride 0.9 % 25 mL IVPB   100 mg Intravenous Q12H Bowser, Laurel Dimmer, NP   Stopped at 05/20/22 1028   levETIRAcetam (KEPPRA) IVPB 1000 mg/100 mL premix  1,000 mg Intravenous Q12H Cristal Generous, NP   Stopped at 05/20/22 0825   [START ON 05/21/2022] levothyroxine (SYNTHROID) tablet 75 mcg  75 mcg Per Tube Q0600 Margaretha Seeds, MD       LORazepam (ATIVAN) tablet 1-4 mg  1-4 mg Oral Q1H PRN Angelique Blonder, DO       Or   LORazepam (ATIVAN) injection 1-4 mg  1-4 mg Intravenous Q1H PRN Angelique Blonder, DO       multivitamin with minerals tablet 1 tablet  1 tablet Per Tube Daily Margaretha Seeds, MD   1 tablet at 05/20/22 1318   ondansetron (ZOFRAN) injection 4 mg  4 mg Intravenous Q6H PRN Bowser, Laurel Dimmer, NP       pantoprazole (PROTONIX) injection 40 mg  40 mg Intravenous Q12H Cristal Generous, NP   40 mg at 05/20/22 0958   polyethylene glycol (MIRALAX / GLYCOLAX) packet 17 g  17 g Per Tube Daily PRN Bowser, Laurel Dimmer, NP       potassium PHOSPHATE 30 mmol in dextrose 5 % 500 mL infusion  30 mmol Intravenous Once Bell, Lorin C, RPH 85 mL/hr at 05/20/22 1334 Infusion Verify at 05/20/22 1334   thiamine (VITAMIN B1) tablet 100 mg  100 mg Per Tube Daily Margaretha Seeds, MD   100 mg at 05/20/22 1318   Or   thiamine (VITAMIN B1) injection 100 mg  100 mg Intravenous Daily Margaretha Seeds, MD        Allergies as of 05/19/2022   (No Known Allergies)    Family History  Problem Relation Age of Onset   Cancer Maternal Grandmother        skin cancer    Social History   Socioeconomic History   Marital status: Single    Spouse name: Not on file   Number of children: Not on file   Years of education: Not on file   Highest education level: Not on file  Occupational History   Not on file  Tobacco Use   Smoking status: Former    Packs/day: 1.00    Years: 15.00    Total pack years: 15.00    Types: Cigarettes    Quit date: 09/11/2010    Years since quitting: 11.6   Smokeless tobacco: Never  Vaping Use   Vaping Use: Never used   Substance and Sexual Activity   Alcohol use: Yes    Alcohol/week: 2.0 standard drinks of alcohol    Types: 2 Cans of beer per week    Comment: rare   Drug use: No    Comment: occasional   Sexual activity: Never  Other Topics Concern   Not on file  Social History Narrative   Not on file   Social Determinants of Health   Financial Resource Strain: Medium Risk (01/17/2018)   Overall Financial Resource Strain (CARDIA)    Difficulty of Paying Living Expenses: Somewhat hard  Food Insecurity: No Food Insecurity (05/01/2022)   Hunger Vital Sign    Worried About Running Out of Food in the Last Year: Never true    Ran Out of Food in the Last Year: Never true  Transportation Needs: No Transportation Needs (05/01/2022)   PRAPARE - Hydrologist (Medical): No    Lack of Transportation (Non-Medical): No  Physical Activity: Not on file  Stress: Not on file  Social Connections: Not on file  Intimate Partner Violence: Not At Risk (05/01/2022)   Humiliation, Afraid, Rape, and Kick questionnaire    Fear of Current or Ex-Partner: No    Emotionally Abused: No    Physically Abused: No    Sexually Abused: No    Review of Systems: Positive for: GI: Described in detail in HPI.    Gen: Denies any fever, chills, rigors, night sweats, anorexia, fatigue, weakness, malaise, involuntary weight loss, and sleep disorder CV: Denies chest pain, angina, palpitations, syncope, orthopnea, PND, peripheral edema, and claudication. Resp:Recent influenza infection(04/29/22) GU : Denies urinary burning, blood in urine, urinary frequency, urinary hesitancy, nocturnal urination, and urinary incontinence. MS: Denies joint pain or swelling.  Denies muscle weakness, cramps, atrophy.  Derm: Denies rash, itching, oral ulcerations, hives, unhealing ulcers.  Psych: Denies depression, anxiety, memory loss, suicidal ideation, hallucinations,  and confusion. Heme: Denies bruising and enlarged lymph  nodes. Neuro:  History of seizures. Endo:   History of hypothyroidism, denies any problems with DM,, adrenal function.  Physical Exam: Vital signs in last 24 hours: Temp:  [97.6 F (36.4 C)-98.5 F (36.9 C)] 98.3 F (36.8 C) (01/08 1134) Pulse Rate:  [87-119] 89 (01/08 1300) Resp:  [13-29] 18 (01/08 1300) BP: (78-147)/(45-105) 116/82 (01/08 1300) SpO2:  [93 %-100 %] 100 % (01/08 1300) Weight:  [40.2 kg] 40.2 kg (01/08 0500) Last BM Date :  (PTA)  General:   Alert,  Well-developed, thin, pleasant and cooperative in NAD Head:  Normocephalic and atraumatic. Eyes:  Sclera clear, no icterus, mild pallor Ears:  Normal auditory acuity. Nose:  No deformity, discharge,  or lesions. Mouth:  No deformity or lesions.  Oropharynx pink & moist. Neck:  Supple; no masses or thyromegaly. Lungs:  Clear throughout to auscultation.   No wheezes, crackles, or rhonchi. No acute distress. Heart:  Regular rate and rhythm; no murmurs, clicks, rubs,  or gallops. Extremities:  Without clubbing or edema. Neurologic:  Alert and  oriented x4;  grossly normal neurologically. Skin:  Intact without significant lesions or rashes. Psych:  Alert and cooperative. Normal mood and affect. Abdomen:  20 Fr gastrostomy tube in place, non tender   Lab Results: Recent Labs    05/20/22 0420 05/20/22 0807 05/20/22 1344  WBC 14.8* 12.8* PENDING  HGB 11.7* 10.3* 10.1*  HCT 34.6* 29.1* 28.8*  PLT 219  202 215 212   BMET Recent Labs    05/19/22 2113 05/20/22 0421 05/20/22 0807  NA 130* 133* 133*  K 3.4* 3.1* 3.1*  CL 102 103 103  CO2 '22 22 22  '$ GLUCOSE 101* 77 94  BUN 23* 19 16  CREATININE 0.71 0.67 0.67  CALCIUM 7.8* 7.6* 7.4*   LFT Recent Labs    05/19/22 2113  PROT 4.4*  ALBUMIN 2.1*  AST 36  ALT 19  ALKPHOS 65  BILITOT 0.3   PT/INR Recent Labs    05/20/22 0420 05/20/22 0807  LABPROT 13.7 13.6  INR 1.1 1.1    Studies/Results: CT Head Wo Contrast  Result Date: 05/19/2022 CLINICAL  DATA:  Mental status change, unknown cause. History of seizure. EXAM: CT HEAD WITHOUT CONTRAST TECHNIQUE: Contiguous axial images were obtained from the base of the skull through the vertex without intravenous contrast. RADIATION DOSE REDUCTION: This exam was performed according to the departmental dose-optimization program which includes automated exposure control, adjustment of the mA and/or kV according to patient size and/or use of iterative reconstruction technique. COMPARISON:  04/30/2022, 05/05/2022. FINDINGS: Brain: No acute intracranial hemorrhage, midline shift or mass effect. No extra-axial fluid collection. Mild periventricular white matter hypodensities are seen bilaterally. No hydrocephalus. Vascular: No hyperdense vessel or unexpected calcification. Skull: Normal. Negative for fracture or focal lesion. Sinuses/Orbits: No acute finding. Other: None. IMPRESSION: 1. No acute intracranial process. 2. Periventricular white matter hypodensities bilaterally, possible chronic microvascular ischemic changes and not significantly changed from the prior exam. Electronically Signed   By: Brett Fairy M.D.   On: 05/19/2022 21:40   CT Abdomen Pelvis W Contrast  Result Date: 05/19/2022 CLINICAL DATA:  Sepsis. Altered mental status and weakness. History of seizures. EXAM: CT ABDOMEN AND PELVIS WITH CONTRAST TECHNIQUE: Multidetector CT imaging of the abdomen and pelvis was performed using the standard protocol following bolus administration of intravenous contrast. RADIATION DOSE REDUCTION: This exam was performed according to the departmental dose-optimization program which includes automated exposure control, adjustment of the mA and/or kV according to patient size and/or use of iterative reconstruction technique. CONTRAST:  103m OMNIPAQUE IOHEXOL 350 MG/ML SOLN COMPARISON:  12/31/2021. FINDINGS:  Lower chest: Mild patchy infiltrates are noted at the lung bases. Scattered calcified granuloma are present at the  lung bases. Hepatobiliary: No focal liver abnormality is seen. Stones are present within the gallbladder. No biliary ductal dilatation. Pancreas: Unremarkable. No pancreatic ductal dilatation or surrounding inflammatory changes. Spleen: Normal in size without focal abnormality. Adrenals/Urinary Tract: The adrenal glands are within normal limits. The kidneys enhance symmetrically. There is a cyst in the lower pole of the right kidney. A subcentimeter hypodensity is present in the mid left kidney which is too small to further characterize. No renal calculus or hydronephrosis. The bladder is unremarkable. Stomach/Bowel: A PEG tube is noted in the stomach. Appendix is not seen. No evidence of bowel wall thickening, distention, or inflammatory changes. No free air or pneumatosis. Vascular/Lymphatic: Aortic atherosclerosis. No enlarged abdominal or pelvic lymph nodes. Reproductive: Uterus and bilateral adnexa are unremarkable. Other: No abdominopelvic ascites. Musculoskeletal: Degenerative changes in the thoracolumbar spine. No acute or suspicious osseous abnormality. IMPRESSION: 1. Mild patchy infiltrates at the lung bases, suspicious for pneumonia. 2. Cholelithiasis. 3. Aortic atherosclerosis. Electronically Signed   By: Brett Fairy M.D.   On: 05/19/2022 21:35   DG Chest Port 1 View  Result Date: 05/19/2022 CLINICAL DATA:  Questionable sepsis EXAM: PORTABLE CHEST 1 VIEW COMPARISON:  Chest x-ray 05/06/2022 FINDINGS: Bilateral calcified granulomas are unchanged. There is no new focal lung infiltrate, pleural effusion or pneumothorax. Cardiomediastinal silhouette is within normal limits. There is stable scoliosis of the thoracic spine. There is a healed right sixth rib fracture. IMPRESSION: No active disease. Electronically Signed   By: Ronney Asters M.D.   On: 05/19/2022 20:34    Impression: Several days of black tarry stool, melena, hematocrit less than 15 on i-STAT on admission status post 4 unit PRBC, 1 unit  FFP and 1 unit platelet transfusion  Hemoglobin now stable at 11.7/10.3/10.1 Recent IR guided gastrostomy tube placement on 05/15/2022 Elevated BUN admission of 23 with a normal renal function otherwise suspicious for an upper GI bleed  Plan: EGD in a.m. for further evaluation Since feeding tube was placed in because of risk of aspiration, recommend keeping patient n.p.o. but okay to use feeding tube for meds, discussed the same with the patient's nurse. Continue Protonix 40 mg IV every 12 hours until endoscopic evaluation has been performed. The risks and the benefits of the procedure were discussed with the patient in details along with the daughter and niece at bedside. They understand and verbalized consent.   LOS: 1 day   Ronnette Juniper, MD  05/20/2022, 2:42 PM

## 2022-05-20 NOTE — ED Provider Notes (Incomplete)
[facility] EMERGENCY DEPARTMENT Provider Note   CSN: [id number] Arrival date & time: 05/19/22  1950     History {Add pertinent medical, surgical, social history, OB history to HPI:1} Chief Complaint  Patient presents with  . Altered Mental Status    Crystal Spence is a 47 y.o. female.  Past medical history of alcohol use disorder, laryngeal cancer, hypertension, seizures on Vimpat and Keppra, upper GI bleed, hyponatremia, hypothyroidism, leukopenia, pneumonia and pulmonary abscess.  Presents to the emergency department today with altered mental status.  Was with family at a local hotel and reportedly not acting normal about 6 hours prior to arrival.  Was just released from rehab facility 2 days ago after stay after having seizures.  Had a G-tube placed in December of this year, just a couple weeks ago, to have her seizure meds administered through the G-tube.  With EMS was hypotensive and tachycardic, blood pressure 70/40 tachycardia to 110.   Altered Mental Status      Home Medications Prior to Admission medications   Medication Sig Start Date End Date Taking? Authorizing Provider  acetaminophen (TYLENOL) 500 MG tablet Take 500-1,000 mg by mouth every 6 (six) hours as needed for mild pain or headache.    [provider]  diltiazem (CARDIZEM) 30 MG tablet Place 1 tablet (30 mg total) into feeding tube 2 (two) times daily. 05/17/22 11/08/23  Bonnielee Haff, MD  lacosamide 100 MG TABS Place 1 tablet (100 mg total) into feeding tube 2 (two) times daily. 05/17/22   Bonnielee Haff, MD  levETIRAcetam (KEPPRA) 1000 MG tablet Place 1 tablet (1,000 mg total) into feeding tube 2 (two) times daily. 05/17/22   Bonnielee Haff, MD  levothyroxine (SYNTHROID) 75 MCG tablet Place 1 tablet (75 mcg total) into feeding tube daily before breakfast. 05/18/22   Bonnielee Haff, MD  Nutritional Supplements (FEEDING SUPPLEMENT, OSMOLITE 1.2 CAL,) LIQD Place 355 mLs into feeding tube 4  (four) times daily - after meals and at bedtime. 05/17/22   Bonnielee Haff, MD  polyethylene glycol (MIRALAX / GLYCOLAX) 17 g packet Place 17 g into feeding tube daily as needed. 05/17/22   Bonnielee Haff, MD  senna-docusate (SENOKOT-S) 8.6-50 MG tablet Place 2 tablets into feeding tube at bedtime. 05/17/22   Bonnielee Haff, MD  Water For Irrigation, Sterile (FREE WATER) SOLN Place 100 mLs into feeding tube every 6 (six) hours. 05/17/22   Bonnielee Haff, MD      Allergies    Patient has no known allergies.    Review of Systems   Review of Systems  Physical Exam Updated Vital Signs BP (!) 81/50   Pulse (!) 107   Temp 97.9 F (36.6 C) (Axillary)   Resp (!) 22   LMP 10/20/2012 Comment: pt. has had tubal ligation,no longer has peroids  SpO2 97%  Physical Exam Vitals and nursing note reviewed.  Constitutional:      General: She is not in acute distress.    Appearance: She is well-developed. She is ill-appearing. She is not diaphoretic.  HENT:     Head: Normocephalic and atraumatic.     Mouth/Throat:     Mouth: Mucous membranes are dry.  Eyes:     Conjunctiva/sclera: Conjunctivae normal.  Cardiovascular:     Rate and Rhythm: Regular rhythm. Tachycardia present.     Heart sounds: No murmur heard. Pulmonary:     Effort: Pulmonary effort is normal. No respiratory distress.     Breath sounds: Normal breath sounds. No stridor.  No wheezing, rhonchi or rales.  Abdominal:     General: Abdomen is flat.     Palpations: Abdomen is soft.     Tenderness: There is no abdominal tenderness. There is no guarding or rebound.     Comments: G-tube in place, no surrounding erythema or drainage or abnormal discharge  Musculoskeletal:        General: No swelling.     Cervical back: Neck supple.     Right lower leg: No edema.     Left lower leg: No edema.  Skin:    General: Skin is warm and dry.     Capillary Refill: Capillary refill takes less than 2 seconds.     Coloration: Skin is pale.   Neurological:     Mental Status: She is alert. She is disoriented and confused.     GCS: GCS eye subscore is 4. GCS verbal subscore is 2. GCS motor subscore is 5.  Psychiatric:        Mood and Affect: Mood normal.     ED Results / Procedures / Treatments   Labs (all labs ordered are listed, but only abnormal results are displayed) Labs Reviewed - No data to display  EKG None  Radiology No results found.  Procedures Procedures  {Document cardiac monitor, telemetry assessment procedure when appropriate:1}  Medications Ordered in ED Medications - No data to display  ED Course/ Medical Decision Making/ A&P                           Medical Decision Making Amount and/or Complexity of Data Reviewed Labs: ordered. Radiology: ordered. ECG/medicine tests: ordered.  Risk Prescription drug management. Decision regarding hospitalization.   Crystal Spence is a 47 y.o. female with significant PMH alcohol use disorder, laryngeal cancer, hypertension, seizures on Vimpat and Keppra, upper GI bleed, hyponatremia, hypothyroidism, leukopenia, pneumonia and pulmonary abscess, who presents to the ED with altered mental status.  Presumed to be from seizure versus sepsis.  On arrival patient is tachycardic, slightly tachypneic, and hypotensive.  Normal oxygen saturation on room air.  She does trigger sepsis criteria due to her hypotension and tachycardia.  Additionally, this could be due to bleeding as she has a history of GI bleeding.  Although she was just discharged 2 days ago, was at a rehab facility, discussed on the phone with patient's daughter who notes that she felt she was not receiving proper care there and checked her self out.  Was found in a hotel earlier today and was altered.  Was not talking, I have concern that she had a seizure and is having a prolonged postictal period.  Differential diagnosis includes infectious (encephalitis, meningitis, pneumonia, UTI, soft tissue),  polypharmacy, intoxication, cardiac ischemia, metabolic encephalopathy, CNS insult (ICH, SAH, infarct), seizures, thyroid storm.    Sepsis protocol initiated, obtained labs, head CT, chest x-ray, urinalysis, CT of the abdomen pelvis with contrast.  She was given 500 mL IV fluids with EMS prior to arrival.  Will give her 1 L here for a total of 30 cc/kg IV fluids.  Additionally, will type and screen as well as broad-spectrum antibiotics with vancomycin and Zosyn.  Additionally was given Keppra and her home dose of Vimpat for antiseizure coverage.  Labs obtained, significant for slightly elevated lactic acid of 2.4, CMP without any significant abnormalities other than slight hyponatremia and slight hypokalemia.  CBC unable to be read, lab showing that there are no readable results.  On i-STAT labs here, hematocrit is only resulting as less than 15.  Concern for bleeding, patient did have a positive fecal occult blood test with melena, no gross blood.  Due to her tachycardia with hypotension and melena, concerned that she may be having a GI bleed.  Was given Protonix and transfused 2 units of red blood cells.. CT head  obtained showing no acute intracranial abnormalities as to the cause of her altered mental status. EKG showed sinus tachycardia but without any signs of acute ischemia.    On re-evaluation,   Patient treatment plan discussed with and medical decision making supervised by my attending physician Dr. Marland Kitchen   {Document critical care time when appropriate:1} {Document review of labs and clinical decision tools ie heart score, Chads2Vasc2 etc:1}  {Document your independent review of radiology images, and any outside records:1} {Document your discussion with family members, caretakers, and with consultants:1} {Document social determinants of health affecting pt's care:1} {Document your decision making why or why not admission, treatments were needed:1} Final Clinical Impression(s) / ED  Diagnoses Final diagnoses:  None    Rx / DC Orders ED Discharge Orders     None

## 2022-05-21 ENCOUNTER — Encounter (HOSPITAL_COMMUNITY): Payer: Self-pay | Admitting: Critical Care Medicine

## 2022-05-21 ENCOUNTER — Inpatient Hospital Stay (HOSPITAL_COMMUNITY): Payer: Medicaid Other | Admitting: Anesthesiology

## 2022-05-21 ENCOUNTER — Encounter (HOSPITAL_COMMUNITY)
Admission: EM | Disposition: A | Discharge status: Skilled Nursing Facility | Payer: Self-pay | Source: Home / Self Care | Attending: Pulmonary Disease

## 2022-05-21 DIAGNOSIS — D62 Acute posthemorrhagic anemia: Secondary | ICD-10-CM | POA: Diagnosis not present

## 2022-05-21 DIAGNOSIS — I10 Essential (primary) hypertension: Secondary | ICD-10-CM | POA: Diagnosis not present

## 2022-05-21 DIAGNOSIS — Z87891 Personal history of nicotine dependence: Secondary | ICD-10-CM

## 2022-05-21 DIAGNOSIS — K279 Peptic ulcer, site unspecified, unspecified as acute or chronic, without hemorrhage or perforation: Secondary | ICD-10-CM

## 2022-05-21 DIAGNOSIS — E039 Hypothyroidism, unspecified: Secondary | ICD-10-CM | POA: Diagnosis not present

## 2022-05-21 DIAGNOSIS — D638 Anemia in other chronic diseases classified elsewhere: Secondary | ICD-10-CM

## 2022-05-21 HISTORY — PX: BIOPSY: SHX5522

## 2022-05-21 HISTORY — PX: ESOPHAGOGASTRODUODENOSCOPY (EGD) WITH PROPOFOL: SHX5813

## 2022-05-21 LAB — BPAM RBC
Blood Product Expiration Date: 202402022359
Blood Product Expiration Date: 202402022359
Blood Product Expiration Date: 202402022359
Blood Product Expiration Date: 202402022359
Blood Product Expiration Date: 202402022359
ISSUE DATE / TIME: 202401072250
ISSUE DATE / TIME: 202401080042
ISSUE DATE / TIME: 202401080042
ISSUE DATE / TIME: 202401080138
ISSUE DATE / TIME: 202401090655
Unit Type and Rh: 5100
Unit Type and Rh: 5100
Unit Type and Rh: 5100
Unit Type and Rh: 5100
Unit Type and Rh: 5100

## 2022-05-21 LAB — BASIC METABOLIC PANEL
Anion gap: 8 (ref 5–15)
BUN: 9 mg/dL (ref 6–20)
CO2: 21 mmol/L — ABNORMAL LOW (ref 22–32)
Calcium: 7.2 mg/dL — ABNORMAL LOW (ref 8.9–10.3)
Chloride: 104 mmol/L (ref 98–111)
Creatinine, Ser: 0.72 mg/dL (ref 0.44–1.00)
GFR, Estimated: >60 mL/min (ref >60)
Glucose, Bld: 86 mg/dL (ref 70–99)
Potassium: 4.1 mmol/L (ref 3.5–5.1)
Sodium: 133 mmol/L — ABNORMAL LOW (ref 135–145)

## 2022-05-21 LAB — CBC
HCT: 30.6 % — ABNORMAL LOW (ref 36.0–46.0)
HCT: 31.1 % — ABNORMAL LOW (ref 36.0–46.0)
Hemoglobin: 10.5 g/dL — ABNORMAL LOW (ref 12.0–15.0)
Hemoglobin: 10.6 g/dL — ABNORMAL LOW (ref 12.0–15.0)
MCH: 30.3 pg (ref 26.0–34.0)
MCH: 30.3 pg (ref 26.0–34.0)
MCHC: 34.3 g/dL (ref 30.0–36.0)
MCHC: 34.1 g/dL (ref 30.0–36.0)
MCV: 88.2 fL (ref 80.0–100.0)
MCV: 88.9 fL (ref 80.0–100.0)
Platelets: 206 10*3/uL (ref 150–400)
Platelets: 217 10*3/uL (ref 150–400)
RBC: 3.47 MIL/uL — ABNORMAL LOW (ref 3.87–5.11)
RBC: 3.5 MIL/uL — ABNORMAL LOW (ref 3.87–5.11)
RDW: 20.2 % — ABNORMAL HIGH (ref 11.5–15.5)
RDW: 20.2 % — ABNORMAL HIGH (ref 11.5–15.5)
WBC: 10.3 10*3/uL (ref 4.0–10.5)
WBC: 9.4 10*3/uL (ref 4.0–10.5)
nRBC: 2.1 % — ABNORMAL HIGH (ref 0.0–0.2)
nRBC: 1.4 % — ABNORMAL HIGH (ref 0.0–0.2)

## 2022-05-21 LAB — TYPE AND SCREEN
ABO/RH(D): O POS
Antibody Screen: NEGATIVE
Unit division: 0
Unit division: 0
Unit division: 0
Unit division: 0
Unit division: 0

## 2022-05-21 LAB — PROCALCITONIN: Procalcitonin: 0.19 ng/mL

## 2022-05-21 LAB — PREPARE FRESH FROZEN PLASMA: Unit division: 0

## 2022-05-21 LAB — BPAM FFP
Blood Product Expiration Date: 202401112359
ISSUE DATE / TIME: 202401080043
Unit Type and Rh: 8400

## 2022-05-21 LAB — MAGNESIUM
Magnesium: 2.7 mg/dL — ABNORMAL HIGH (ref 1.7–2.4)
Magnesium: 2.6 mg/dL — ABNORMAL HIGH (ref 1.7–2.4)

## 2022-05-21 LAB — BPAM PLATELET PHERESIS
Blood Product Expiration Date: 202401092359
ISSUE DATE / TIME: 202401080042
Unit Type and Rh: 6200

## 2022-05-21 LAB — PHOSPHORUS
Phosphorus: 2.9 mg/dL (ref 2.5–4.6)
Phosphorus: 2.6 mg/dL (ref 2.5–4.6)

## 2022-05-21 LAB — GLUCOSE, CAPILLARY
Glucose-Capillary: 83 mg/dL (ref 70–99)
Glucose-Capillary: 84 mg/dL (ref 70–99)
Glucose-Capillary: 79 mg/dL (ref 70–99)
Glucose-Capillary: 85 mg/dL (ref 70–99)
Glucose-Capillary: 81 mg/dL (ref 70–99)

## 2022-05-21 LAB — PREPARE PLATELET PHERESIS: Unit division: 0

## 2022-05-21 LAB — PATHOLOGIST SMEAR REVIEW: Path Review: NEGATIVE

## 2022-05-21 SURGERY — ESOPHAGOGASTRODUODENOSCOPY (EGD) WITH PROPOFOL
Anesthesia: Monitor Anesthesia Care

## 2022-05-21 MED ORDER — VITAL HIGH PROTEIN PO LIQD
1000.0000 mL | ORAL | Status: DC
  Administered 2022-05-21: 1000 mL
  Filled 2022-05-21: qty 1000

## 2022-05-21 MED ORDER — DIPHENHYDRAMINE HCL 50 MG/ML IJ SOLN
25.0000 mg | Freq: Once | INTRAMUSCULAR | Status: AC | PRN
  Administered 2022-05-21: 25 mg via INTRAVENOUS
  Filled 2022-05-21: qty 1

## 2022-05-21 MED ORDER — ONDANSETRON HCL 4 MG/2ML IJ SOLN: 4.0000 mg | Freq: Four times a day (QID) | INTRAMUSCULAR | Status: DC | PRN

## 2022-05-21 MED ORDER — PROPOFOL 10 MG/ML IV BOLUS
INTRAVENOUS | Status: DC | PRN
  Administered 2022-05-21: 20 mg via INTRAVENOUS

## 2022-05-21 MED ORDER — OXYCODONE HCL 5 MG PO TABS: 5.0000 mg | ORAL_TABLET | Freq: Once | ORAL | Status: DC | PRN

## 2022-05-21 MED ORDER — PROPOFOL 500 MG/50ML IV EMUL
INTRAVENOUS | Status: DC | PRN
  Administered 2022-05-21: 125 ug/kg/min via INTRAVENOUS

## 2022-05-21 MED ORDER — LIDOCAINE 2% (20 MG/ML) 5 ML SYRINGE
INTRAMUSCULAR | Status: DC | PRN
  Administered 2022-05-21: 40 mg via INTRAVENOUS

## 2022-05-21 MED ORDER — LACTATED RINGERS IV SOLN
INTRAVENOUS | Status: AC | PRN
  Administered 2022-05-21: 1000 mL via INTRAVENOUS

## 2022-05-21 MED ORDER — ACETAMINOPHEN 500 MG PO TABS
1000.0000 mg | ORAL_TABLET | Freq: Four times a day (QID) | ORAL | Status: DC
  Administered 2022-05-21 – 2022-05-23 (×7): 1000 mg
  Filled 2022-05-21 (×7): qty 2

## 2022-05-21 MED ORDER — MORPHINE SULFATE (PF) 2 MG/ML IV SOLN
1.0000 mg | Freq: Once | INTRAVENOUS | Status: AC | PRN
  Administered 2022-05-21: 1 mg via INTRAVENOUS
  Filled 2022-05-21: qty 1

## 2022-05-21 MED ORDER — FENTANYL CITRATE (PF) 100 MCG/2ML IJ SOLN: 25.0000 ug | INTRAMUSCULAR | Status: DC | PRN

## 2022-05-21 MED ORDER — OXYCODONE HCL 5 MG/5ML PO SOLN: 5.0000 mg | Freq: Once | ORAL | Status: DC | PRN

## 2022-05-21 MED ORDER — ORAL CARE MOUTH RINSE
15.0000 mL | OROMUCOSAL | Status: DC
  Administered 2022-05-21 – 2022-05-23 (×7): 15 mL via OROMUCOSAL

## 2022-05-21 MED ORDER — ORAL CARE MOUTH RINSE: 15.0000 mL | OROMUCOSAL | Status: DC | PRN

## 2022-05-21 MED ORDER — PROSOURCE TF20 ENFIT COMPATIBL EN LIQD
60.0000 mL | Freq: Every day | ENTERAL | Status: DC
  Administered 2022-05-21 – 2022-05-22 (×2): 60 mL
  Filled 2022-05-21 (×3): qty 60

## 2022-05-21 MED ORDER — GABAPENTIN 600 MG PO TABS
300.0000 mg | ORAL_TABLET | Freq: Once | ORAL | Status: AC
  Administered 2022-05-21: 300 mg
  Filled 2022-05-21: qty 0.5

## 2022-05-21 SURGICAL SUPPLY — 15 items

## 2022-05-21 NOTE — Op Note (Signed)
[facility] Patient Name: Crystal Spence Procedure Date : 05/21/2022 MRN: [id number] Attending MD: Ronnette Juniper , MD, [id number] Date of Birth: 06-27-1975 CSN: [id number] Age: 47 Admit Type: Inpatient Procedure:                Upper GI endoscopy Indications:              Acute post hemorrhagic anemia, Melena Providers:                Ronnette Juniper, MD Referring MD:             Critical Care Medicines:                See the Anesthesia note for documentation of the                            administered medications Complications:            No immediate complications. Estimated blood loss:                            Minimal. Estimated Blood Loss:     Estimated blood loss was minimal. Procedure:                Pre-Anesthesia Assessment:                           - Prior to the procedure, a History and Physical                            was performed, and patient medications and                            allergies were reviewed. The patient's tolerance of                            previous anesthesia was also reviewed. The risks                            and benefits of the procedure and the sedation                            options and risks were discussed with the patient.                            All questions were answered, and informed consent                            was obtained. Prior Anticoagulants: The patient has                            taken no anticoagulant or antiplatelet agents. ASA                            Grade Assessment: III - A patient with severe  systemic disease. After reviewing the risks and                            benefits, the patient was deemed in satisfactory                            condition to undergo the procedure.                           After obtaining informed consent, the endoscope was                            passed under direct vision. Throughout the                            procedure,  the patient's blood pressure, pulse, and                            oxygen saturations were monitored continuously. The                            GIF-H190 ([id number]) Olympus endoscope was introduced                            through the mouth, and advanced to the second part                            of duodenum. The upper GI endoscopy was                            accomplished without difficulty. The patient                            tolerated the procedure well. Findings:      The examined esophagus was normal.      The Z-line was regular and was found 35 cm from the incisors.      Localized moderately erythematous mucosa without bleeding was found in       the cardia.      There was evidence of an intact gastrostomy with a patent G-tube present       in the gastric body. This was characterized by ulceration.      Patchy mildly erythematous mucosa without bleeding was found in the       gastric body and in the gastric antrum. Biopsies were taken with a cold       forceps for Helicobacter pylori testing.      The cardia and gastric fundus were normal on retroflexion.      One non-bleeding cratered duodenal ulcer with a clean ulcer base       (Forrest Class III) was found in the first portion of the duodenum. The       lesion was 6 mm in largest dimension. Impression:               - Normal esophagus.                           -  Z-line regular, 35 cm from the incisors.                           - Erythematous mucosa in the cardia.                           - Intact gastrostomy with a patent G-tube present                            characterized by ulceration.                           - Erythematous mucosa in the gastric body and                            antrum. Biopsied.                           - Non-bleeding duodenal ulcer with a clean ulcer                            base (Forrest Class III). Moderate Sedation:      Patient did not receive moderate sedation for this  procedure, but       instead received monitored anesthesia care. Recommendation:           - Ok to start feeding tube nutrition today.                           - PPI BID for 2 months.                           - Await pathology results. Treat H pylori if found. Procedure Code(s):        --- Professional ---                           [phone], Esophagogastroduodenoscopy, flexible,                            transoral; with biopsy, single or multiple Diagnosis Code(s):        --- Professional ---                           K31.89, Other diseases of stomach and duodenum                           Z93.1, Gastrostomy status                           K26.9, Duodenal ulcer, unspecified as acute or                            chronic, without hemorrhage or perforation                           D62, Acute posthemorrhagic anemia  K92.1, Melena (includes Hematochezia) CPT copyright 2022 American Medical Association. All rights reserved. The codes documented in this report are preliminary and upon coder review may  be revised to meet current compliance requirements. Ronnette Juniper, MD 05/21/2022 1:55:03 PM This report has been signed electronically. Number of Addenda: 0

## 2022-05-21 NOTE — Evaluation (Signed)
Clinical/Bedside Swallow Evaluation Patient Details  Name: Crystal Spence MRN: [id number] Date of Birth: 03-30-1976  Today's Date: 05/21/2022 Time: SLP Start Time (ACUTE ONLY): 7371 SLP Stop Time (ACUTE ONLY): 0626 SLP Time Calculation (min) (ACUTE ONLY): 20 min  Past Medical History:  Past Medical History:  Diagnosis Date   Anemia, unspecified 06/17/2013   Headache    History of laryngeal cancer 02/03/2013   Hypertension    Hypothyroidism (acquired) 06/17/2013   Multiple lung nodules 06/28/2014   Pneumonia 01/14/2014, 04/2017   Poor oral hygiene 06/17/2013   Rib pain 01/14/2014   Rib pain on left side 01/05/2014   Throat cancer [facility])    Past Surgical History:  Past Surgical History:  Procedure Laterality Date   ABDOMINAL SURGERY     BIOPSY  01/18/2018   Procedure: BIOPSY;  Surgeon: Wonda Horner, MD;  Location: [facility];  Service: Endoscopy;;   COLONOSCOPY WITH PROPOFOL N/A 10/12/2015   Procedure: COLONOSCOPY WITH PROPOFOL;  Surgeon: Wonda Horner, MD;  Location: [facility] ENDOSCOPY;  Service: Endoscopy;  Laterality: N/A;   ESOPHAGOGASTRODUODENOSCOPY N/A 02/03/2013   Procedure: ESOPHAGOGASTRODUODENOSCOPY (EGD);  Surgeon: Juanita Craver, MD;  Location: [facility] ENDOSCOPY;  Service: Endoscopy;  Laterality: N/A;   ESOPHAGOGASTRODUODENOSCOPY N/A 10/12/2015   Procedure: ESOPHAGOGASTRODUODENOSCOPY (EGD);  Surgeon: Wonda Horner, MD;  Location: [facility];  Service: Endoscopy;  Laterality: N/A;   ESOPHAGOGASTRODUODENOSCOPY (EGD) WITH PROPOFOL N/A 01/18/2018   Procedure: ESOPHAGOGASTRODUODENOSCOPY (EGD) WITH PROPOFOL;  Surgeon: Wonda Horner, MD;  Location: [facility] ENDOSCOPY;  Service: Endoscopy;  Laterality: N/A;   IR GASTROSTOMY TUBE MOD SED  05/15/2022   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 female with PMH: laryngeal cancer, s/p radiation and chemotherapy, oropharyngeal dysphagia with malnutrition requiring G-tube placement by IR on 05/15/22. She presented to the hospital on 05/19/22 with AMS, lethargy. She had been released from rehab facility two days ago after being treated for seizures. In ED she was hypotensive, tachycardic,  i-STAT hematocrit was less than 15, FOBT was positive. Patient reported having liquid black stools for past several days. Patient reportedly denied having problems with swallowing in ED.    Assessment / Plan / Recommendation  Clinical Impression  Patient presents with clinical s/s of dysphagia as per this bedside swallow evaluation and suspect not much has changed since recent SLP intervention. As per FEES objective swallow evaluation which was completed 05/10/22 and reported that patient continued with chronic dysphagia s/p chemoradiation treatment for laryngeal cancer; aspiration to some degree was observed with all tested consistencies (nectar thick, honey thick, thin, puree) and PEG was recommended due to the severity of her dysphagia. Per RN, patient has been requesting PO's (mainly liquids). SLP spoke with MD prior to entering room regarding her h/o dysphagia. Patient was awake and alert, flat affect and requires cues to respond at times. She is frustrated by her not being allowed to have anything to eat or drink and when SLP explains, she does not engage in conversation or demonstrate understanding versus acceptance when discussing her chronic dysphagia. With ice chips, she exhibited one instance of cough response but with very small cup sips of thin liquids (water), she exhibited immediate cough approximately 80% of all swallows. SLP recommending continue NPO but  allow ice chips PRN and will f/u next date to determine best/safest plan moving forward. SLP Visit Diagnosis: Dysphagia, unspecified (R13.10)    Aspiration Risk  Severe aspiration  risk;Risk for inadequate nutrition/hydration    Diet Recommendation NPO;Ice chips PRN after oral care   Medication Administration: Via alternative means    Other  Recommendations Oral Care Recommendations: Oral care QID;Oral care prior to ice chip/H20    Recommendations for follow up therapy are one component of a multi-disciplinary discharge planning process, led by the attending physician.  Recommendations may be updated based on patient status, additional functional criteria and insurance authorization.  Follow up Recommendations Follow physician's recommendations for discharge plan and follow up therapies      Assistance Recommended at Discharge    Functional Status Assessment Patient has had a recent decline in their functional status and demonstrates the ability to make significant improvements in function in a reasonable and predictable amount of time.  Frequency and Duration min 2x/week  1 week       Prognosis Prognosis for Safe Diet Advancement: Guarded Barriers to Reach Goals: Severity of deficits;Time post onset      Swallow Study   General Date of Onset: 05/21/22 HPI: Patient is a 47 y.o. female with PMH: laryngeal cancer, s/p radiation and chemotherapy, oropharyngeal dysphagia with malnutrition requiring G-tube placement by IR on 05/15/22. She presented to the hospital on 05/19/22 with AMS, lethargy. She had been released from rehab facility two days ago after being treated for seizures. In ED she was hypotensive, tachycardic,  i-STAT hematocrit was less than 15, FOBT was positive. Patient reported having liquid black stools for past several days. Patient reportedly denied having problems with swallowing in ED. Type of Study: Bedside Swallow Evaluation Previous Swallow Assessment: FEES 05/10/2022 Diet Prior to this Study: NPO Temperature Spikes Noted: No Respiratory Status: Room air History of Recent Intubation: No Behavior/Cognition: Alert;Cooperative Oral Cavity  Assessment: Within Functional Limits Oral Care Completed by SLP: No Oral Cavity - Dentition: Poor condition Self-Feeding Abilities: Total assist Patient Positioning: Upright in bed Baseline Vocal Quality: Normal;Low vocal intensity Volitional Cough: Weak Volitional Swallow: Able to elicit    Oral/Motor/Sensory Function Overall Oral Motor/Sensory Function: Generalized oral weakness   Ice Chips Ice chips: Impaired Pharyngeal Phase Impairments: Throat Clearing - Delayed   Thin Liquid Thin Liquid: Impaired Presentation: Cup Pharyngeal  Phase Impairments: Suspected delayed Swallow;Cough - Immediate    Nectar Thick Nectar Thick Liquid: Not tested   Honey Thick Honey Thick Liquid: Not tested   Puree Puree: Not tested   Sonia Baller, MA, CCC-SLP Speech Therapy

## 2022-05-21 NOTE — Progress Notes (Signed)
[place] Progress Note Patient Name: Crystal Spence DOB: [birth year] MRN: [id number]   Date of Service  05/21/2022  HPI/Events of Note  Recurrence of generalized pain Now awake, not in acute distress NPO for EGD this morning  eICU Interventions  Morphine 1 mg IV ordered x 1     Intervention Category Intermediate Interventions: Pain - evaluation and management  Judd Lien 05/21/2022, 6:47 AM

## 2022-05-21 NOTE — Anesthesia Preprocedure Evaluation (Signed)
Anesthesia Evaluation  Patient identified by MRN, date of birth, ID band Patient awake    Reviewed: Allergy & Precautions, H&P , NPO status , Patient's Chart, lab work & pertinent test results  Airway Mallampati: II   Neck ROM: full    Dental   Pulmonary former smoker tracheostomy   breath sounds clear to auscultation       Cardiovascular hypertension,  Rhythm:regular Rate:Normal     Neuro/Psych  Headaches, Seizures -,     GI/Hepatic   Endo/Other  Hypothyroidism    Renal/GU      Musculoskeletal   Abdominal   Peds  Hematology  (+) Blood dyscrasia, anemia   Anesthesia Other Findings   Reproductive/Obstetrics                             Anesthesia Physical Anesthesia Plan  ASA: 2  Anesthesia Plan: MAC   Post-op Pain Management:    Induction: Intravenous  PONV Risk Score and Plan: 2 and Propofol infusion and Treatment may vary due to age or medical condition  Airway Management Planned: Tracheostomy  Additional Equipment:   Intra-op Plan:   Post-operative Plan:   Informed Consent: I have reviewed the patients History and Physical, chart, labs and discussed the procedure including the risks, benefits and alternatives for the proposed anesthesia with the patient or authorized representative who has indicated his/her understanding and acceptance.     Dental advisory given  Plan Discussed with: CRNA, Anesthesiologist and Surgeon  Anesthesia Plan Comments:        Anesthesia Quick Evaluation

## 2022-05-21 NOTE — Progress Notes (Signed)
[place] Progress Note Patient Name: Crystal Spence DOB: August 19, 1975 MRN: [id number]   Date of Service  05/21/2022  HPI/Events of Note  Itching recurred, also with generalized pain given Tylenol Patient now seen asleep  eICU Interventions  Benadryl reordered     Intervention Category Intermediate Interventions: Pain - evaluation and management;Other:  Judd Lien 05/21/2022, 4:32 AM

## 2022-05-21 NOTE — Interval H&P Note (Signed)
History and Physical Interval Note: 46/female with anemia, melena and recent PEG tube placement for an EGD with propofol.  05/21/2022 1:06 PM  Crystal Spence  has presented today for EGD, with the diagnosis of melena, anemia,peg tube in place.  The various methods of treatment have been discussed with the patient and family. After consideration of risks, benefits and other options for treatment, the patient has consented to  Procedure(s): ESOPHAGOGASTRODUODENOSCOPY (EGD) WITH PROPOFOL (N/A) as a surgical intervention.  The patient's history has been reviewed, patient examined, no change in status, stable for surgery.  I have reviewed the patient's chart and labs.  Questions were answered to the patient's satisfaction.     Ronnette Juniper

## 2022-05-21 NOTE — Transfer of Care (Signed)
Immediate Anesthesia Transfer of Care Note  Patient: CHELBY SALATA  Procedure(s) Performed: ESOPHAGOGASTRODUODENOSCOPY (EGD) WITH PROPOFOL BIOPSY  Patient Location: PACU  Anesthesia Type:MAC  Level of Consciousness: awake, alert , and oriented  Airway & Oxygen Therapy: Patient Spontanous Breathing and Patient connected to nasal cannula oxygen  Post-op Assessment: Report given to RN and Post -op Vital signs reviewed and stable  Post vital signs: Reviewed and stable  Last Vitals:  Vitals Value Taken Time  BP    Temp    Pulse 90 05/21/22 1341  Resp 26 05/21/22 1341  SpO2 95 % 05/21/22 1341  Vitals shown include unvalidated device data.  Last Pain:  Vitals:   05/21/22 1256  TempSrc:   PainSc: 8       Patients Stated Pain Goal: 0 (32/20/25 4270)  Complications: No notable events documented.

## 2022-05-21 NOTE — Progress Notes (Addendum)
[place] Progress Note Patient Name: Crystal Spence DOB: 14-Jun-1975 MRN: [id number]   Date of Service  05/21/2022  HPI/Events of Note  Patient c/o pain all over and itching all over. Received Benadryl at 4:51 PM and states that was not effective. Will try Gabapentin which should be effective for both generalized pain and itching not responding to antihistamines.   eICU Interventions  Plan: Gabapentin 300 mg per tube X 1.      Intervention Category Major Interventions: Other:  Lysle Dingwall 05/21/2022, 8:59 PM

## 2022-05-21 NOTE — Anesthesia Procedure Notes (Signed)
Procedure Name: MAC Date/Time: 05/21/2022 1:22 PM  Performed by: Colin Benton, CRNAPre-anesthesia Checklist: Patient identified, Emergency Drugs available, Suction available and Patient being monitored Patient Re-evaluated:Patient Re-evaluated prior to induction Oxygen Delivery Method: Nasal cannula Induction Type: IV induction Airway Equipment and Method: Bite block Placement Confirmation: positive ETCO2 Dental Injury: Teeth and Oropharynx as per pre-operative assessment

## 2022-05-21 NOTE — Progress Notes (Addendum)
NAME:  Crystal Spence, MRN:  [id number], DOB:  1975/07/30, LOS: 2 ADMISSION DATE:  05/19/2022, CONSULTATION DATE:  05/19/22 REFERRING MD:  Tyrone Nine - EM, CHIEF COMPLAINT:  AMS  History of Present Illness:  47 yo F PMH seizure disorder, laryngeal ca s/p radiation with resultant oropharyngeal dysphagia and related malnutrition requiring G tube, previous gastric ulcers (2019, neg for Hpylori), recent Flu PNA, hypothyroidism, presents to ED 1/8 [place] with AMS. Has had dark tarry stools for about 2 days. Was hypotensive with EMS and received 532m NS. In ED rcvd additional 1L IVF, as well as empiric abx for possible sepsis.   CMP with minor lyte abnormalities but normal renal and hepatic fxn. CBC however was "unreliable"-- presumably undetectably low values based on a POC read which revealed a HCT < 15 and an undetectably low Hgb. CT H WNL. CT a/p with con did not reveal an acute process to explain.   Receiving 2 PRBC in ED.   PCCM consulted for admission in this setting   Pertinent  Medical History  Seizure disorder Laryngeal cancer Oropharyngeal dysphagia Severe protein calorie malnutrition Hypothyroidism HTN Gastric ulcers   Significant Hospital Events: Including procedures, antibiotic start and stop dates in addition to other pertinent events   1/8 ED from hotel with weakness, AMS. Undetectably low Hgb HCT. S/p 4 PRBC, 1 plt , 1 FFP   Interim History / Subjective:  Received benadryl and morphine overnight for itching and generalized pain. Not in acute distress. Plan for EGD today.   Objective   Blood pressure (!) 122/95, pulse 85, temperature 97.9 F (36.6 C), temperature source Oral, resp. rate 19, weight 40.8 kg, last menstrual period 10/20/2012, SpO2 97 %.        Intake/Output Summary (Last 24 hours) at 05/21/2022 [id number] data filed at 05/21/2022 0600 Gross per 24 hour  Intake 2090.38 ml  Output 950 ml  Net 1140.38 ml    Filed Weights   05/20/22 0500 05/21/22 0500   Weight: 40.2 kg 40.8 kg    Examination: General: thin appearing female, in no acute distress Lungs: unlabored breathing on RA Cardiovascular: RRR Abdomen: soft, non-tender, G tube in place Extremities: warm and dry Neuro: awake and alert, follows commands  Hgb 10.5, stable K 4.1, improved Mag 2.7, improved Na 133, unchanged  Resolved Hospital Problem list   Lactic acidosis  Assessment & Plan:  Acute encephalopathy due to shock- resolved Hemorrhagic shock vs hypovolemic shock- improved ABLA, severe  Hx of gastric ulcers- EGD in 2019 showed non-bleeding ulcer. IDA Colonoscopy done in 2017 was normal. S/p 4 units PRBC, 1 unit PLT, 1 unit FFP -Hgb steady 10.5  -EGD planned for today -trend CBC  -transfuse if Hgb <7 -Protonix 40 mg IV BID  Seizure disorder -continue Keppra and Vimpat  -seizure precautions  Lactic acidosis-resolved Hyponatremia Hypokalemia-resolved Hypomagnesemia-resolved Hypophosphatemia -resolved LA 2.4>2.1>0.7. In setting of severe blood loss. Addressing electrolytes derangements.  -replete electrolytes as needed  Hypothyroidism  -home levothyroxine -follow up with TSH in outpatient setting  HTN Initially hypotensive, now normotensive. Hold home meds for now.   Hx laryngeal cancer with resultant oropharyngeal dysphagia Severe protein calorie malnutrition s/p G-tube G tube placed at previous hospitalization on 05/15/22.  -NPO   Best Practice (right click and "Reselect all SmartList Selections" daily)   Diet/type: NPO DVT prophylaxis: not indicated, EGD today GI prophylaxis: PPI Lines: N/A Foley:  N/A Code Status:  full code Last date of multidisciplinary goals of care discussion [updated patient  and family]   Critical care time:     Angelique Blonder, DO

## 2022-05-22 DIAGNOSIS — R578 Other shock: Secondary | ICD-10-CM

## 2022-05-22 LAB — GLUCOSE, CAPILLARY
Glucose-Capillary: 87 mg/dL (ref 70–99)
Glucose-Capillary: 91 mg/dL (ref 70–99)
Glucose-Capillary: 92 mg/dL (ref 70–99)
Glucose-Capillary: 95 mg/dL (ref 70–99)
Glucose-Capillary: 95 mg/dL (ref 70–99)
Glucose-Capillary: 97 mg/dL (ref 70–99)

## 2022-05-22 LAB — CBC
HCT: 33 % — ABNORMAL LOW (ref 36.0–46.0)
Hemoglobin: 11.2 g/dL — ABNORMAL LOW (ref 12.0–15.0)
MCH: 30.9 pg (ref 26.0–34.0)
MCHC: 33.9 g/dL (ref 30.0–36.0)
MCV: 91.2 fL (ref 80.0–100.0)
Platelets: 220 10*3/uL (ref 150–400)
RBC: 3.62 MIL/uL — ABNORMAL LOW (ref 3.87–5.11)
RDW: 20.6 % — ABNORMAL HIGH (ref 11.5–15.5)
WBC: 6.3 10*3/uL (ref 4.0–10.5)
nRBC: 0.5 % — ABNORMAL HIGH (ref 0.0–0.2)

## 2022-05-22 LAB — PHOSPHORUS
Phosphorus: 2.5 mg/dL (ref 2.5–4.6)
Phosphorus: 2.3 mg/dL — ABNORMAL LOW (ref 2.5–4.6)

## 2022-05-22 LAB — BASIC METABOLIC PANEL
Anion gap: 6 (ref 5–15)
BUN: 12 mg/dL (ref 6–20)
CO2: 21 mmol/L — ABNORMAL LOW (ref 22–32)
Calcium: 7.6 mg/dL — ABNORMAL LOW (ref 8.9–10.3)
Chloride: 103 mmol/L (ref 98–111)
Creatinine, Ser: 0.66 mg/dL (ref 0.44–1.00)
GFR, Estimated: >60 mL/min (ref >60)
Glucose, Bld: 102 mg/dL — ABNORMAL HIGH (ref 70–99)
Potassium: 3.4 mmol/L — ABNORMAL LOW (ref 3.5–5.1)
Sodium: 130 mmol/L — ABNORMAL LOW (ref 135–145)

## 2022-05-22 LAB — URINE CULTURE: Culture: 100 — AB

## 2022-05-22 LAB — MAGNESIUM
Magnesium: 1.9 mg/dL (ref 1.7–2.4)
Magnesium: 1.7 mg/dL (ref 1.7–2.4)

## 2022-05-22 MED ORDER — SODIUM CHLORIDE 0.9 % IV SOLN
1.0000 g | Freq: Two times a day (BID) | INTRAVENOUS | Status: DC
  Filled 2022-05-22 (×2): qty 10

## 2022-05-22 MED ORDER — OSMOLITE 1.2 CAL PO LIQD
1000.0000 mL | ORAL | Status: DC
  Administered 2022-05-22: 1000 mL
  Filled 2022-05-22 (×3): qty 1000

## 2022-05-22 MED ORDER — HYDROCODONE-ACETAMINOPHEN 5-325 MG PO TABS
1.0000 | ORAL_TABLET | Freq: Once | ORAL | Status: AC | PRN
  Administered 2022-05-23: 1 via ORAL
  Filled 2022-05-22: qty 1

## 2022-05-22 MED ORDER — DILTIAZEM HCL 30 MG PO TABS
30.0000 mg | ORAL_TABLET | Freq: Two times a day (BID) | ORAL | Status: DC
  Administered 2022-05-22 – 2022-05-23 (×3): 30 mg
  Filled 2022-05-22 (×3): qty 1

## 2022-05-22 MED ORDER — DIPHENHYDRAMINE HCL 25 MG PO CAPS
25.0000 mg | ORAL_CAPSULE | Freq: Once | ORAL | Status: AC | PRN
  Administered 2022-05-22: 25 mg
  Filled 2022-05-22: qty 1

## 2022-05-22 NOTE — Progress Notes (Addendum)
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  Patient presents to the ED on 05/20/2021 from hotel where she currently resides long-term with mental status changes, history notable for dark tarry stools for approximately 48 hours.  Noted to be hypotensive at admission.  Admitted to the ICU for possible sepsis versus shock versus symptomatic anemia.  Patient evaluated by GI, status post EGD showing erythematous mucosa and ulcerations likely site of bleeding as well as duodenal ulcer that was noted to be nonbleeding at the time of evaluation.  These were presumed to be the source of prior bleeding and cause for patient's symptomatic anemia.  Status posttransfusion now improving, transition to hospitalist team 05/22/2022  *Of note patient was recently hospitalized on an unrelated diagnosis with recent flu infection with questionable breakthrough seizures and metabolic encephalopathy.  Improved but given profound dysphagia PEG tube was placed for definitive medication and feeding route.  Subsequently discharged back home without complication.  Assessment & Plan:   Principal Problem:   Hemorrhagic shock ([place]) Active Problems:   Encephalopathy acute   ABLA (acute blood loss anemia)   Acute encephalopathy due to hypovolemic/hemorrhagic shock- resolved Acute blood loss anemia, severe, secondary to gastric versus duodenal ulcer, POA -Status post 4 units PRBC, 1 unit PLT, 1 unit FFP since intake -Hgb steady 10.5  -Protonix 40 mg IV BID per GI   Seizure disorder -continue Keppra and Vimpat  -seizure precautions   Lactic  acidosis-resolved Hyponatremia Hypokalemia-resolved Hypomagnesemia-resolved Hypophosphatemia -resolved -Likely secondary to above hemorrhagic shock and hypovolemia  -Lactic acidosis downtrending appropriately, continue IV fluids, advance diet as tolerated.     UTI ruled out; Sepsis ruled out , POA -Patient without fever or leukocytosis, minimally tachypneic without tachycardia (hypotension and mental status changes likely secondary to above) -Patient asymptomatic with pseudomonas + but only 100 colonies -No indication for antibiotics  Hypothyroidism  -Continue home levothyroxine  HTN Holding home medications given hypotension at intake Currently normotensive, consider discontinuation of medications at discharge   Hx laryngeal cancer with resultant oropharyngeal dysphagia Severe protein calorie malnutrition s/p G-tube G tube placed at previous hospitalization on 05/15/22.  -NPO ongoing, follow-up outpatient speech as scheduled  DVT prophylaxis: None given above Code Status: Full Family Communication: None present  Status is: Inpatient  Dispo: The patient is from: Home              Anticipated d/c is to: Home              Anticipated d/c date is: 24 to 48 hours pending clinical course              Patient currently not medically stable for discharge having profound anemia, hypovolemia and hypotension  Consultants:  GI, PCCM  Procedures:  EGD 05/21/2022  Antimicrobials:  None  Subjective: No acute issues or events overnight denies nausea vomiting diarrhea constipation headache fevers chills or chest pain  Objective: Vitals:   05/21/22 2100 05/21/22 2130 05/21/22 2315 05/22/22 0500  BP: (!) 129/90  (!) 134/92   Pulse: 79 80 82   Resp: (!) '21 19 20   '$ Temp:      TempSrc:   Oral   SpO2: 98% 99% 99%   Weight:  40.7 kg    Intake/Output Summary (Last 24 hours) at 05/22/2022 0746 Last data filed at 05/21/2022 2200 Gross per 24 hour  Intake 916.91 ml  Output 570 ml   Net 346.91 ml   Filed Weights   05/20/22 0500 05/21/22 0500 05/22/22 0500  Weight: 40.2 kg 40.8 kg 40.7 kg    Examination:  General:  Pleasantly resting in bed, No acute distress. HEENT:  Normocephalic atraumatic.  Sclerae nonicteric, noninjected.  Extraocular movements intact bilaterally. Neck:  Without mass or deformity.  Trachea is midline. Lungs:  Clear to auscultate bilaterally without rhonchi, wheeze, or rales. Heart:  Regular rate and rhythm.  Without murmurs, rubs, or gallops. Abdomen:  Soft, nontender, nondistended.  Without guarding or rebound. Extremities: Without cyanosis, clubbing, edema, or obvious deformity. Vascular:  Dorsalis pedis and posterior tibial pulses palpable bilaterally. Skin:  Warm and dry, no erythema, no ulcerations.  Data Reviewed: I have personally reviewed following labs and imaging studies  CBC: Recent Labs  Lab 05/19/22 2113 05/19/22 2146 05/20/22 1344 05/20/22 2042 05/21/22 0319 05/21/22 0805 05/22/22 0637  WBC QUESTIONABLE RESULTS, RECOMMEND RECOLLECT TO VERIFY   < > 11.8* 9.8 10.3 9.4 6.3  NEUTROABS QUESTIONABLE RESULTS, RECOMMEND RECOLLECT TO VERIFY  --   --   --   --   --   --   HGB QUESTIONABLE RESULTS, RECOMMEND RECOLLECT TO VERIFY   < > 10.1* 10.3* 10.5* 10.6* 11.2*  HCT QUESTIONABLE RESULTS, RECOMMEND RECOLLECT TO VERIFY   < > 28.8* 30.7* 30.6* 31.1* 33.0*  MCV QUESTIONABLE RESULTS, RECOMMEND RECOLLECT TO VERIFY   < > 86.0 88.7 88.2 88.9 91.2  PLT QUESTIONABLE RESULTS, RECOMMEND RECOLLECT TO VERIFY   < > 212 207 206 217 220   < > = values in this interval not displayed.   Basic Metabolic Panel: Recent Labs  Lab 05/16/22 0319 05/19/22 2113 05/20/22 0421 05/20/22 0807 05/21/22 0319 05/21/22 0805 05/22/22 0637  NA 130* 130* 133* 133* 133*  --  130*  K 3.7 3.4* 3.1* 3.1* 4.1  --  3.4*  CL 101 102 103 103 104  --  103  CO2 21* '22 22 22 '$ 21*  --  21*  GLUCOSE 76 101* 77 94 86  --  102*  BUN 10 23* '19 16 9  '$ --  12   CREATININE 0.69 0.71 0.67 0.67 0.72  --  0.66  CALCIUM 7.9* 7.8* 7.6* 7.4* 7.2*  --  7.6*  MG 1.7  --   --  1.5* 2.7* 2.6* 1.9  PHOS  --   --   --  2.2* 2.9 2.6 2.5   GFR: Estimated Creatinine Clearance: 55.3 mL/min (by C-G formula based on SCr of 0.66 mg/dL). Liver Function Tests: Recent Labs  Lab 05/19/22 2113  AST 36  ALT 19  ALKPHOS 65  BILITOT 0.3  PROT 4.4*  ALBUMIN 2.1*   No results for input(s): "LIPASE", "AMYLASE" in the last 168 hours. No results for input(s): "AMMONIA" in the last 168 hours. Coagulation Profile: Recent Labs  Lab 05/19/22 2113 05/20/22 0420 05/20/22 0807  INR 1.2 1.1 1.1   Cardiac Enzymes: No results for input(s): "CKTOTAL", "CKMB", "CKMBINDEX", "TROPONINI" in the last 168 hours. BNP (last 3 results) No results for input(s): "PROBNP" in the last 8760 hours. HbA1C: No results for input(s): "HGBA1C" in the last 72 hours. CBG: Recent Labs  Lab 05/21/22 1116 05/21/22 1600 05/21/22 1944 05/22/22 0030 05/22/22 0358  GLUCAP 79 85 81 95 97   Lipid Profile: No  results for input(s): "CHOL", "HDL", "LDLCALC", "TRIG", "CHOLHDL", "LDLDIRECT" in the last 72 hours. Thyroid Function Tests: No results for input(s): "TSH", "T4TOTAL", "FREET4", "T3FREE", "THYROIDAB" in the last 72 hours. Anemia Panel: No results for input(s): "VITAMINB12", "FOLATE", "FERRITIN", "TIBC", "IRON", "RETICCTPCT" in the last 72 hours. Sepsis Labs: Recent Labs  Lab 05/19/22 2051 05/20/22 0420 05/20/22 1344 05/21/22 0319  PROCALCITON  --  0.30  --  0.19  LATICACIDVEN 2.4* 2.1* 0.7  --     Recent Results (from the past 240 hour(s))  Blood Culture (routine x 2)     Status: None (Preliminary result)   Collection Time: 05/19/22  8:41 PM   Specimen: BLOOD  Result Value Ref Range Status   Specimen Description BLOOD BLOOD LEFT WRIST  Final   Special Requests AEROBIC BOTTLE ONLY Blood Culture adequate volume  Final   Culture   Final    NO GROWTH 2 DAYS Performed at [facility], [address] [place], [place] [address]    Report Status PENDING  Incomplete  Blood Culture (routine x 2)     Status: None (Preliminary result)   Collection Time: 05/19/22  8:51 PM   Specimen: BLOOD  Result Value Ref Range Status   Specimen Description BLOOD RIGHT ANTECUBITAL  Final   Special Requests   Final    BOTTLES DRAWN AEROBIC AND ANAEROBIC Blood Culture adequate volume   Culture   Final    NO GROWTH 2 DAYS Performed at [facility], [place] [address], [place], [place] [address]    Report Status PENDING  Incomplete  Urine Culture     Status: Abnormal (Preliminary result)   Collection Time: 05/19/22 11:34 PM   Specimen: In/Out Cath Urine  Result Value Ref Range Status   Specimen Description IN/OUT CATH URINE  Final   Special Requests NONE  Final   Culture (A)  Final    100 COLONIES/mL PSEUDOMONAS AERUGINOSA SUSCEPTIBILITIES TO FOLLOW Performed at [facility], [place] [address] [place], [place] [address]    Report Status PENDING  Incomplete  MRSA Next Gen by PCR, Nasal     Status: None   Collection Time: 05/20/22  1:04 AM   Specimen: Nasal Mucosa; Nasal Swab  Result Value Ref Range Status   MRSA by PCR Next Gen NOT DETECTED NOT DETECTED Final    Comment: (NOTE) The GeneXpert MRSA Assay (FDA approved for NASAL specimens only), is one component of a comprehensive MRSA colonization surveillance program. It is not intended to diagnose MRSA infection nor to guide or monitor treatment for MRSA infections. Test performance is not FDA approved in patients less than 37 years old. Performed at [facility], [place] [address] [place], [place] [address]   Resp panel by RT-PCR (RSV, Flu A&B, Covid) Anterior Nasal Swab     Status: None   Collection Time: 05/20/22  9:57 AM   Specimen: Anterior Nasal Swab  Result Value Ref Range Status   SARS Coronavirus 2 by RT PCR NEGATIVE NEGATIVE Final    Comment: (NOTE) SARS-CoV-2 target nucleic acids are NOT  DETECTED.  The SARS-CoV-2 RNA is generally detectable in upper respiratory specimens during the acute phase of infection. The lowest concentration of SARS-CoV-2 viral copies this assay can detect is 138 copies/mL. A negative result does not preclude SARS-Cov-2 infection and should not be used as the sole basis for treatment or other patient management decisions. A negative result may occur with  improper specimen collection/handling, submission  of specimen other than nasopharyngeal swab, presence of viral mutation(s) within the areas targeted by this assay, and inadequate number of viral copies(<138 copies/mL). A negative result must be combined with clinical observations, patient history, and epidemiological information. The expected result is Negative.  Fact Sheet for Patients:  EntrepreneurPulse.com.au  Fact Sheet for Healthcare Providers:  IncredibleEmployment.be  This test is no t yet approved or cleared by the [place] FDA and  has been authorized for detection and/or diagnosis of SARS-CoV-2 by FDA under an Emergency Use Authorization (EUA). This EUA will remain  in effect (meaning this test can be used) for the duration of the COVID-19 declaration under Section 564(b)(1) of the Act, 21 U.S.C.section 360bbb-3(b)(1), unless the authorization is terminated  or revoked sooner.       Influenza A by PCR NEGATIVE NEGATIVE Final   Influenza B by PCR NEGATIVE NEGATIVE Final    Comment: (NOTE) The Xpert Xpress SARS-CoV-2/FLU/RSV plus assay is intended as an aid in the diagnosis of influenza from Nasopharyngeal swab specimens and should not be used as a sole basis for treatment. Nasal washings and aspirates are unacceptable for Xpert Xpress SARS-CoV-2/FLU/RSV testing.  Fact Sheet for Patients: EntrepreneurPulse.com.au  Fact Sheet for Healthcare Providers: IncredibleEmployment.be  This test is not yet  approved or cleared by the [place] FDA and has been authorized for detection and/or diagnosis of SARS-CoV-2 by FDA under an Emergency Use Authorization (EUA). This EUA will remain in effect (meaning this test can be used) for the duration of the COVID-19 declaration under Section 564(b)(1) of the Act, 21 U.S.C. section 360bbb-3(b)(1), unless the authorization is terminated or revoked.     Resp Syncytial Virus by PCR NEGATIVE NEGATIVE Final    Comment: (NOTE) Fact Sheet for Patients: EntrepreneurPulse.com.au  Fact Sheet for Healthcare Providers: IncredibleEmployment.be  This test is not yet approved or cleared by the [place] FDA and has been authorized for detection and/or diagnosis of SARS-CoV-2 by FDA under an Emergency Use Authorization (EUA). This EUA will remain in effect (meaning this test can be used) for the duration of the COVID-19 declaration under Section 564(b)(1) of the Act, 21 U.S.C. section 360bbb-3(b)(1), unless the authorization is terminated or revoked.  Performed at [facility], [place] [address] [place], [place] [address]    Radiology Studies: No results found.  Scheduled Meds:  acetaminophen  1,000 mg Per Tube Q6H   Chlorhexidine Gluconate Cloth  6 each Topical Q2000   feeding supplement (PROSource TF20)  60 mL Per Tube Daily   feeding supplement (VITAL HIGH PROTEIN)  1,000 mL Per Tube I29N   folic acid  1 mg Per Tube Daily   levothyroxine  75 mcg Per Tube Q0600   multivitamin with minerals  1 tablet Per Tube Daily   mouth rinse  15 mL Mouth Rinse 4 times per day   pantoprazole (PROTONIX) IV  40 mg Intravenous Q12H   thiamine  100 mg Per Tube Daily   Continuous Infusions:  sodium chloride 10 mL/hr at 05/21/22 2200   lacosamide (VIMPAT) IV 70 mL/hr at 05/21/22 2200   levETIRAcetam Stopped (05/21/22 2039)     LOS: 3 days   Time spent: 20mn  Rollin Kotowski C Kallen Delatorre, DO [facility]  If  7PM-7AM, please contact night-coverage [email or web address]  05/22/2022, 7:46 AM

## 2022-05-22 NOTE — Progress Notes (Addendum)
Speech Language Pathology Treatment: Dysphagia  Patient Details Name: Crystal Spence MRN: [id number] DOB: 06-Feb-1976 Today's Date: 05/22/2022 Time: 2694-8546 SLP Time Calculation (min) (ACUTE ONLY): 10 min  Assessment / Plan / Recommendation Clinical Impression  Crystal Spence has chronic dysphagia and aspirates all PO intake per FEES 12/29.  PEG was placed prior to D/C. She was D/Cd with recs for general NPO except sips of water and ice chips. Today, she is alert and communicative. She verbalizes her desire to continue eating/drinking to supplement PEG feedings. She has a basic understanding of the impact of her chemoradiation on her swallowing. She can state that food/liquid may "go into" her lungs but is not able to talk about the potential adverse effects of aspiration.  Additionally, she has lived with chronic dysphagia and probable aspiration for many years (she underwent chemotherapy and radiation for her laryngeal cancer in 2012). Her swallowing has likely deteriorated in this time. Studies have shown severe dysphagia to be a late complication of radiotherapy in [facility] survivors. Despite its presence and the probability of aspiration, she has had few instances of pna since her cancer dx.  For now, allow ice chips and sips of thin liquid. SLP will follow for further discussion with Crystal Spence.       HPI HPI: Patient is a 47 y.o. female with PMH: laryngeal cancer, s/p radiation and chemotherapy, oropharyngeal dysphagia with malnutrition requiring G-tube placement by IR on 05/15/22. She presented to the hospital on 05/19/22 with AMS, lethargy. She had been released from rehab facility two days ago after being treated for seizures. In ED she was hypotensive, tachycardic,  i-STAT hematocrit was less than 15, FOBT was positive. Patient reported having liquid black stools for past several days. Patient reportedly denied having problems with swallowing in ED.      SLP Plan  F/u for education next  date      Recommendations for follow up therapy are one component of a multi-disciplinary discharge planning process, led by the attending physician.  Recommendations may be updated based on patient status, additional functional criteria and insurance authorization.    Recommendations  Diet recommendations: Other(comment) (ice chips and sips of water) Liquids provided via: Cup;Straw;Teaspoon Medication Administration: Via alternative means Compensations: Small sips/bites                Oral Care Recommendations: Oral care QID Follow Up Recommendations: No SLP follow up SLP Visit Diagnosis: Dysphagia, pharyngeal phase (R13.13) Plan: Discharge SLP treatment due to (comment)        Zayleigh Stroh L. Tivis Ringer, MA CCC/SLP Clinical Specialist - Acute Care SLP Acute Rehabilitation Services Office number [phone]    Juan Quam Laurice  05/22/2022, 5:17 PM

## 2022-05-22 NOTE — TOC Initial Note (Signed)
Transition of Care [facility]) - Initial/Assessment Note    Patient Details  Name: Crystal Spence MRN: [id number] Date of Birth: 10-Oct-1975  Transition of Care [facility]) CM/SW Contact:    Milinda Antis, [place] Phone Number: 05/22/2022, 9:11 AM  Clinical Narrative:                 LCSW received consult for homelessness and substance use and met with the patient at bedside.  The patient reports living in a motel, [place], and expresses plans to return at discharge.  The patient's income includes disability and a "small" amount of FNS.  The patient lives alone, but reports having the support of her niece and daughter if needed to assist with completing small task.  The patient has never had home health and does not have any DME at her residence.    LCSW engaged with the patient about substance use.  The patient reports she does not have any issues with substance use and declined resources.    TOC following.      Barriers to Discharge: Continued Medical Work up   Patient Goals and CMS Choice            Expected Discharge Plan and Services In-house Referral: Clinical Social Work     Living arrangements for the past 2 months: Hotel/Motel                                      Prior Living Arrangements/Services Living arrangements for the past 2 months: Hotel/Motel Lives with:: Self Patient language and need for interpreter reviewed:: Yes Do you feel safe going back to the place where you live?: Yes      Need for Family Participation in Patient Care: Yes (Comment) Care giver support system in place?: Yes (comment)   Criminal Activity/Legal Involvement Pertinent to Current Situation/Hospitalization: No - Comment as needed  Activities of Daily Living      Permission Sought/Granted                  Emotional Assessment Appearance:: Appears older than stated age Attitude/Demeanor/Rapport: Guarded Affect (typically observed): Stable Orientation: : Oriented to  Situation, Oriented to  Time, Oriented to Place, Oriented to Self Alcohol / Substance Use: Not Applicable Psych Involvement: No (comment)  Admission diagnosis:  Hemorrhagic shock ([place]) [R57.8] [place] [K92.1] Altered mental status, unspecified altered mental status type [R41.82] Sepsis, due to unspecified organism, unspecified whether acute organ dysfunction present [facility]) [A41.9] Patient Active Problem List   Diagnosis Date Noted   Encephalopathy acute 05/20/2022   ABLA (acute blood loss anemia) 05/20/2022   Hemorrhagic shock ([place]) 05/19/2022   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   Acute respiratory failure with hypoxia ([place]) 05/01/2022   Influenza A 05/01/2022   Pyelonephritis 08/14/2021   Nausea and vomiting 08/14/2021   Hypokalemia 03/15/2019   Seizure-like activity ([place]) 05/03/2018   Hypertensive urgency 05/03/2018   Chronic headaches 05/03/2018   Dysphagia 02/04/2018   Pressure injury of skin 01/18/2018   GI bleed 01/17/2018   Melena    Smoking 11/03/2017   Seizure ([place])    Essential hypertension 06/02/2017   Positive pregnancy test 06/02/2017   Bronchitis, acute    Observed seizure-like activity ([place]) 06/01/2017   Medication monitoring encounter 04/29/2016   Protein-calorie malnutrition, severe 03/19/2016   Chronic anemia    Thrombocytosis    Abscess of lower lobe of right lung with pneumonia (  [place])    Hypoxemia    Poor dentition    Pulmonary abscess ([place]) 03/17/2016   Dehydration 03/17/2016   Abnormal LFTs 12/14/2015   Iron deficiency anemia 12/04/2015   Multiple lung nodules 06/28/2014   Other type of nonintractable migraine 06/28/2014   Leukopenia 06/28/2014   Rib pain 01/14/2014   Rib pain on left side 01/05/2014   Unexplained weight loss 01/05/2014   Anemia in chronic illness 06/17/2013   Hypothyroidism (acquired) 06/17/2013   Poor oral hygiene 06/17/2013   Acute blood loss anemia 02/03/2013   Syncope 02/03/2013   Hyponatremia 02/03/2013   History of  laryngeal cancer 02/03/2013   Hypokalemia due to excessive gastrointestinal loss of potassium 02/02/2013   Alcohol abuse 02/02/2013   Upper GI bleed 02/02/2013   PCP:  Trey Sailors, PA Pharmacy:   [facility] [place], [place] - [place] [address] [place] [address] Phone: [phone] Fax: [phone]  [facility] [phone] - [place], [place] - [address] [place] [facility] [address] Phone: [phone] Fax: [phone]     Social Determinants of Health (SDOH) Social History: [place]: No Food Insecurity (05/01/2022)  Housing: Low Risk  (05/01/2022)  Transportation Needs: No Transportation Needs (05/01/2022)  Utilities: Not At Risk (05/01/2022)  Financial Resource Strain: Medium Risk (01/17/2018)  Tobacco Use: Medium Risk (05/21/2022)   SDOH Interventions:     Readmission Risk Interventions     No data to display

## 2022-05-22 NOTE — Anesthesia Postprocedure Evaluation (Signed)
Anesthesia Post Note  Patient: Crystal Spence  Procedure(s) Performed: ESOPHAGOGASTRODUODENOSCOPY (EGD) WITH PROPOFOL BIOPSY     Patient location during evaluation: PACU Anesthesia Type: MAC Level of consciousness: awake and alert Pain management: pain level controlled Vital Signs Assessment: post-procedure vital signs reviewed and stable Respiratory status: spontaneous breathing, nonlabored ventilation, respiratory function stable and patient connected to nasal cannula oxygen Cardiovascular status: stable and blood pressure returned to baseline Postop Assessment: no apparent nausea or vomiting Anesthetic complications: no   No notable events documented.  Last Vitals:  Vitals:   05/21/22 2315 05/22/22 0859  BP: (!) 134/92 125/88  Pulse: 82 83  Resp: 20 17  Temp:  36.7 C  SpO2: 99% 96%    Last Pain:  Vitals:   05/22/22 0859  TempSrc: Oral  PainSc:                  [place]

## 2022-05-22 NOTE — Progress Notes (Signed)
Initial Nutrition Assessment  DOCUMENTATION CODES:   Severe malnutrition in context of chronic illness  INTERVENTION:  - Initiate Osmolite 1.2 @ 20 mL/hr and advance by 10 mL/hr Q12H to goal rate of 60 mL/hr.  - This will provide 1728 kcals, 80 gm protein, and 1180 mL free water.   NUTRITION DIAGNOSIS:   Severe Malnutrition related to chronic illness as evidenced by severe fat depletion, severe muscle depletion.  GOAL:   Patient will meet greater than or equal to 90% of their needs  MONITOR:   PO intake, TF tolerance, Diet advancement  REASON FOR ASSESSMENT:   Consult Enteral/tube feeding initiation and management  ASSESSMENT:   47 y.o. female admits related to AMS. Pt had dark tarry stools for 2 days PTA. Pt is s/p laryngeal cancer s/p radiation with oropharyngeal dysphagia requiring G-tube. Pt is currently receiving medical management related to hemorrhagic shock.  Meds reviewed: folic acid, MVI, thiamine. Labs reviewed: Na low (130); K low (3.4), Calcium low.   Pt is currently NPO and evaluated by SLP on 1/9.  SLP has recommended NPO diet status due to dysphagia with ice chips PRN. Pt just had PEG placed on 05/15/22. MD consult for trickle feeds with slow advancement to goal rate. RD will initiate Osmolite 1.2 @ 20 mL/hr and advance by 10 mL/hr Q12H to a goal rate of 60 mL/hr. Once patient is tolerating goal rate, recommend transitioning to bolus feeds that were recommended by previous RD during recent admission:   Osmolite 1.2, 358m - 4x/d (6 cartons total per day) 562mof free water before and after each bolus feed This will provide 1710kcal, 79g of protein, and 157076mf free water Start with 120m79mhen increase by 1/2 carton until tolerating 1.5 cartons at each feed.  Pt reports that she was eating fairly well PTA. No significant wt loss per record.   NUTRITION - FOCUSED PHYSICAL EXAM:  Flowsheet Row Most Recent Value  Orbital Region Severe depletion  Upper Arm  Region Severe depletion  Thoracic and Lumbar Region Severe depletion  Buccal Region Severe depletion  Temple Region Severe depletion  Clavicle Bone Region Severe depletion  Clavicle and Acromion Bone Region Severe depletion  Scapular Bone Region Severe depletion  Dorsal Hand Severe depletion  Patellar Region Severe depletion  Anterior Thigh Region Severe depletion  Posterior Calf Region Severe depletion  Edema (RD Assessment) None  Hair Reviewed  Eyes Reviewed  Mouth Reviewed  Skin Reviewed  Nails Reviewed       Diet Order:   Diet Order             Diet NPO time specified Except for: Ice Chips  Diet effective now                   EDUCATION NEEDS:   Not appropriate for education at this time  Skin:  Skin Assessment: Reviewed RN Assessment  Last BM:  05/18/22  Height:   Ht Readings from Last 1 Encounters:  05/02/22 '5\' 1"'$  (1.549 m)    Weight:   Wt Readings from Last 1 Encounters:  05/22/22 40.7 kg    Ideal Body Weight:     BMI:  Body mass index is 16.95 kg/m.  Estimated Nutritional Needs:   Kcal:  1500-1700 kcals  Protein:  75-90 gm  Fluid:  >/= 1.5 L  AbbyThalia Bloodgood, LDN, CNSC.

## 2022-05-23 DIAGNOSIS — R578 Other shock: Secondary | ICD-10-CM | POA: Diagnosis not present

## 2022-05-23 LAB — BASIC METABOLIC PANEL
Anion gap: 7 (ref 5–15)
BUN: 11 mg/dL (ref 6–20)
CO2: 22 mmol/L (ref 22–32)
Calcium: 7.6 mg/dL — ABNORMAL LOW (ref 8.9–10.3)
Chloride: 104 mmol/L (ref 98–111)
Creatinine, Ser: 0.78 mg/dL (ref 0.44–1.00)
GFR, Estimated: >60 mL/min (ref >60)
Glucose, Bld: 84 mg/dL (ref 70–99)
Potassium: 3.2 mmol/L — ABNORMAL LOW (ref 3.5–5.1)
Sodium: 133 mmol/L — ABNORMAL LOW (ref 135–145)

## 2022-05-23 LAB — CBC
HCT: 35.6 % — ABNORMAL LOW (ref 36.0–46.0)
Hemoglobin: 11.4 g/dL — ABNORMAL LOW (ref 12.0–15.0)
MCH: 29.6 pg (ref 26.0–34.0)
MCHC: 32 g/dL (ref 30.0–36.0)
MCV: 92.5 fL (ref 80.0–100.0)
Platelets: 235 10*3/uL (ref 150–400)
RBC: 3.85 MIL/uL — ABNORMAL LOW (ref 3.87–5.11)
RDW: 20.6 % — ABNORMAL HIGH (ref 11.5–15.5)
WBC: 4 10*3/uL (ref 4.0–10.5)
nRBC: 0 % (ref 0.0–0.2)

## 2022-05-23 LAB — PHOSPHORUS: Phosphorus: 2.6 mg/dL (ref 2.5–4.6)

## 2022-05-23 LAB — MAGNESIUM: Magnesium: 1.8 mg/dL (ref 1.7–2.4)

## 2022-05-23 MED ORDER — LACOSAMIDE 50 MG PO TABS
100.0000 mg | ORAL_TABLET | Freq: Two times a day (BID) | ORAL | Status: DC
  Administered 2022-05-23: 100 mg
  Filled 2022-05-23: qty 2

## 2022-05-23 MED ORDER — FOLIC ACID 1 MG PO TABS: 1.0000 mg | ORAL_TABLET | Freq: Every day | ORAL | 0 refills | Status: AC

## 2022-05-23 MED ORDER — POTASSIUM CHLORIDE CRYS ER 20 MEQ PO TBCR
40.0000 meq | EXTENDED_RELEASE_TABLET | ORAL | Status: AC
  Administered 2022-05-23 (×2): 40 meq via ORAL
  Filled 2022-05-23 (×2): qty 2

## 2022-05-23 MED ORDER — LEVETIRACETAM 500 MG PO TABS
1000.0000 mg | ORAL_TABLET | Freq: Two times a day (BID) | ORAL | Status: DC
  Administered 2022-05-23: 1000 mg
  Filled 2022-05-23: qty 2

## 2022-05-23 MED ORDER — VITAMIN B-1 100 MG PO TABS: 100.0000 mg | ORAL_TABLET | Freq: Every day | ORAL | 0 refills | Status: AC

## 2022-05-23 MED ORDER — PANTOPRAZOLE SODIUM 40 MG PO PACK: 40.0000 mg | PACK | Freq: Two times a day (BID) | ORAL | 1 refills | Status: AC

## 2022-05-23 MED ORDER — ADULT MULTIVITAMIN W/MINERALS CH: 1.0000 | ORAL_TABLET | Freq: Every day | ORAL | 0 refills | Status: AC

## 2022-05-23 NOTE — TOC Transition Note (Addendum)
Transition of Care [facility]) - CM/SW Discharge Note   Patient Details  Name: Crystal Spence MRN: [id number] Date of Birth: [birth year]  Transition of [facility]) CM/SW Contact:  Curlene Labrum, RN Phone Number: 05/23/2022, 11:38 AM   Clinical Narrative:    CM met with the patient at the bedside - daughter is present.  The patient has G-tube placed on the 05/15/2021  and tube feeding and supplies are needed for home.  The patient states that it has been years since she had a g-tube and has no supplies nor pump at home from the past.  The patient states that she will need supplies for home and a bedside refresher for teaching. Bedside nursing is aware.  I called and asked for pharmacy to supplies 2 bottles of adult formula for home - pending delivery of formula.  I placed a referral this morning with [employer] and Carolynn Sayers, RNCM is at bedside with the patient and daughter at this time for teaching.  DME supplies will be sent to the company for insurance authorization - pending today - before discharge to home.  The patient's daughter states that patient will be discharged to her home today at [facility], [place], [place] [address] for a few days.  The patient's daughter will provide transportation to home by car.  The patient's address at the hotel is listed on the face-sheet.  CM will continue to follow the patient and patient is able to discharge later this afternoon to home once insurance authorization has been received.  05/23/2022  1436 - Carolynn Sayers, RNCM with [employer] completed tube feeding teaching with the family and Insurance authorization letter will be signed by the attending physician prior to her discharge to home.    I called the main pharmacy and requested that Osmolite tube feeding bottle be sent home with the patient while she is waiting on delivery of her own supplies to the home tonight.    Final next level of care: Home/Self Care Barriers to Discharge: No  Barriers Identified (D/C to home once teaching and insurance authorization has been received by [employer] for tube feeding supplies)   Patient Goals and CMS Choice CMS Medicare.gov Compare Post Acute Care list provided to:: Patient (daughter present in the hospital room.) Choice offered to / list presented to : Adult Children  Discharge Placement                         Discharge Plan and Services Additional resources added to the After Visit Summary for   In-house Referral: Clinical Social Work Discharge Planning Services: CM Consult Post Acute Care Choice: Durable Medical Equipment          DME Arranged: Tube feeding pump, Tube feeding DME Agency:  ([employer] company) Date DME Agency Contacted: 05/23/22 Time DME Agency Contacted: [id number] Representative spoke with at DME Agency: Jeannene Patella, RNCM with [employer] for tube feedings and supplies            Social Determinants of Health ([place]) Interventions [place]: No Food Insecurity (05/01/2022)  Housing: Low Risk  (05/01/2022)  Transportation Needs: No Transportation Needs (05/01/2022)  Utilities: Not At Risk (05/01/2022)  Financial Resource Strain: Medium Risk (01/17/2018)  Tobacco Use: Medium Risk (05/21/2022)     Readmission Risk Interventions    05/23/2022   11:29 AM  Readmission Risk Prevention Plan  Transportation Screening Complete  Medication Review Press photographer) Complete  PCP or Specialist appointment within 3-5 days of discharge Complete  HRI or [place] Complete  SW Recovery Care/Counseling Consult Complete  Palliative Care Screening Complete  Skilled Nursing Facility Complete

## 2022-05-23 NOTE — Progress Notes (Addendum)
Speech Language Pathology Treatment: Dysphagia  Patient Details Name: Crystal Spence MRN: [id number] DOB: 05-25-1975 Today's Date: 05/23/2022 Time: 0930-0950 SLP Time Calculation (min) (ACUTE ONLY): 20 min  Assessment / Plan / Recommendation Clinical Impression  Followed-up with Crystal Spence, who is anxious to leave the hospital.  She has a strong desire to resume eating and was observed to consume soft solids and thin liquids with occasional, mild coughing. This behavior is likely consistent with her baseline, chronic dysphagia.  She will continue to intermittently aspirate. Historically, she has been able to manage chronic aspiration without developing adverse consequences.  This may, of course, change in the future, but as of today Crystal Spence is adamant about resuming some PO intake.  I discussed the situation with Dr. Avon Gully and he agrees with recommendation to resume POs ( I will write orders for dysphagia 3/thin liquids).   Crystal Spence needs extensive education re: PEG feedings - I am concerned that she won't be able to manage the tube and TF independently.  Her health literacy is low and she has difficulty discussing/recalling her medical care and history.  Will continue to follow while admitted.    HPI HPI: Patient is a 47 y.o. female with PMH: laryngeal cancer, s/p radiation and chemotherapy, oropharyngeal dysphagia with malnutrition requiring G-tube placement by IR on 05/15/22. She presented to the hospital on 05/19/22 with AMS, lethargy. She had been released from rehab facility two days ago after being treated for seizures. In ED she was hypotensive, tachycardic,  i-STAT hematocrit was less than 15, FOBT was positive. Patient reported having liquid black stools for past several days. Patient reportedly denied having problems with swallowing in ED.      SLP Plan  Continue with current plan of care      Recommendations for follow up therapy are one component of a multi-disciplinary  discharge planning process, led by the attending physician.  Recommendations may be updated based on patient status, additional functional criteria and insurance authorization.    Recommendations  Diet recommendations: Thin liquid;Dysphagia 3 (mechanical soft) Liquids provided via: Cup;Straw;Teaspoon Compensations: Small sips/bites                Oral Care Recommendations: Oral care BID Follow Up Recommendations: No SLP follow up SLP Visit Diagnosis: Dysphagia, pharyngeal phase (R13.13) Plan: Continue with current plan of care         Katniss Weedman L. Tivis Ringer, MA CCC/SLP Clinical Specialist - Acute Care SLP Acute Rehabilitation Services Office number [phone]   Juan Quam Laurice  05/23/2022, 1:17 PM

## 2022-05-23 NOTE — Discharge Summary (Signed)
Physician Discharge Summary  Crystal Spence VZD:[id number] DOB: August 14, 1975 DOA: 05/19/2022  PCP: Trey Sailors, PA  Admit date: 05/19/2022 Discharge date: 05/23/2022  Admitted From: Home Disposition: Home  Recommendations for Outpatient Follow-up:  Follow up with PCP in 1-2 weeks Please obtain BMP/CBC in one week Follow-up with GI as scheduled.  Home Health: None Equipment/Devices: Per case management including G-tube supplies and pump  Discharge Condition: Stable CODE STATUS: Full Diet recommendation: Continue tube feeds and free water flushes per dietary; will advance p.o. diet per speech evaluation today for dysphagia 3 and thin liquids.  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  Patient presents to the ED on 05/20/2021 from hotel where she currently resides long-term with mental status changes, history notable for dark tarry stools for approximately 48 hours.  Noted to be hypotensive at admission.  Admitted to the ICU for possible sepsis versus shock versus symptomatic anemia.   Patient evaluated by GI, status post EGD showing erythematous mucosa and ulcerations likely site of bleeding as well as duodenal ulcer that was noted to be nonbleeding at the time of evaluation.  These were presumed to be the source of prior bleeding and cause for patient's symptomatic anemia.  Status posttransfusion now improving, transition to hospitalist team 05/22/2022  Patient continued to improve, hemoglobin stable no further signs or symptoms of bleeding.  She is now tolerating p.o. somewhat more improved than last hospitalization currently on mechanical soft and thin liquids, to supplement this with ongoing tube feeds in the interim.  Patient will be discharged home today with tube feeding  equipment pump and formula.  Recommend close follow-up with PCP as well as GI in the next 1 to 2 weeks to ensure stable hemoglobin.  Medication changes as below including increased PPI dosing.  Discharge Diagnoses:  Principal Problem:   Hemorrhagic shock ([place]) Active Problems:   Encephalopathy acute   ABLA (acute blood loss anemia)  Acute encephalopathy due to hypovolemic/hemorrhagic shock- resolved Acute blood loss anemia, severe, secondary to gastric versus duodenal ulcer, POA -Status post 4 units PRBC, 1 unit PLT, 1 unit FFP since intake -Hgb stable, no further signs or symptoms of bleeding -Continue increased dose of Protonix per GI, follow-up outpatient as above   Seizure disorder -continue Keppra and Vimpat  -Follow-up with neurology as previously scheduled   Lactic acidosis-resolved Hyponatremia Hypokalemia-resolved Hypomagnesemia-resolved Hypophosphatemia -resolved -Likely secondary to above hemorrhagic shock and hypovolemia  -Lactic acidosis downtrending appropriately, continue IV fluids, advance diet as tolerated.   -Recommend increased p.o. and take over the next 24 hours, between tube feeds and p.o. intake given advancement of diet this should not be a problem for the patient   UTI ruled out; Sepsis ruled out , POA -Patient without fever or leukocytosis, minimally tachypneic without tachycardia (hypotension and mental status changes likely secondary to above) -Patient asymptomatic with pseudomonas + but only 100 colonies -No indication for antibiotics   Hypothyroidism  -Continue home levothyroxine   HTN Currently normotensive off medications with transient episodes of hypotension, follow-up with PCP posthospitalization for repeat blood pressure evaluation, no indication to initiate new medications at this time   Hx laryngeal cancer with resultant oropharyngeal dysphagia Severe protein calorie malnutrition s/p G-tube G tube placed at previous hospitalization on  05/15/22.  -Advancing diet per speech given improvements in evaluation, mechanical soft with thin liquids  recommended currently, caloric supplementation with PEG tube feeds as necessary  Discharge Instructions   Allergies as of 05/23/2022   No Known Allergies      Medication List     TAKE these medications    acetaminophen 500 MG tablet Commonly known as: TYLENOL Take 500-1,000 mg by mouth every 6 (six) hours as needed for mild pain or headache.   diltiazem 30 MG tablet Commonly known as: Cardizem Place 1 tablet (30 mg total) into feeding tube 2 (two) times daily.   feeding supplement (OSMOLITE 1.2 CAL) Liqd Place 355 mLs into feeding tube 4 (four) times daily - after meals and at bedtime.   folic acid 1 MG tablet Commonly known as: FOLVITE Place 1 tablet (1 mg total) into feeding tube daily.   free water Soln Place 100 mLs into feeding tube every 6 (six) hours.   Lacosamide 100 MG Tabs Place 1 tablet (100 mg total) into feeding tube 2 (two) times daily.   levETIRAcetam 1000 MG tablet Commonly known as: KEPPRA Place 1 tablet (1,000 mg total) into feeding tube 2 (two) times daily.   levothyroxine 75 MCG tablet Commonly known as: SYNTHROID Place 1 tablet (75 mcg total) into feeding tube daily before breakfast.   multivitamin with minerals Tabs tablet Place 1 tablet into feeding tube daily.   pantoprazole sodium 40 mg Commonly known as: PROTONIX Place 40 mg into feeding tube 2 (two) times daily.   polyethylene glycol 17 g packet Commonly known as: MIRALAX / GLYCOLAX Place 17 g into feeding tube daily as needed. What changed: reasons to take this   senna-docusate 8.6-50 MG tablet Commonly known as: Senokot-S Place 2 tablets into feeding tube at bedtime.   thiamine 100 MG tablet Commonly known as: Vitamin B-1 Place 1 tablet (100 mg total) into feeding tube daily.   traZODone 100 MG tablet Commonly known as: DESYREL Take 100 mg by mouth at bedtime as needed  for sleep.        No Known Allergies  Consultations: PCCM, GI   Procedures/Studies: CT Head Wo Contrast  Result Date: 05/19/2022 CLINICAL DATA:  Mental status change, unknown cause. History of seizure. EXAM: CT HEAD WITHOUT CONTRAST TECHNIQUE: Contiguous axial images were obtained from the base of the skull through the vertex without intravenous contrast. RADIATION DOSE REDUCTION: This exam was performed according to the departmental dose-optimization program which includes automated exposure control, adjustment of the mA and/or kV according to patient size and/or use of iterative reconstruction technique. COMPARISON:  04/30/2022, 05/05/2022. FINDINGS: Brain: No acute intracranial hemorrhage, midline shift or mass effect. No extra-axial fluid collection. Mild periventricular white matter hypodensities are seen bilaterally. No hydrocephalus. Vascular: No hyperdense vessel or unexpected calcification. Skull: Normal. Negative for fracture or focal lesion. Sinuses/Orbits: No acute finding. Other: None. IMPRESSION: 1. No acute intracranial process. 2. Periventricular white matter hypodensities bilaterally, possible chronic microvascular ischemic changes and not significantly changed from the prior exam. Electronically Signed   By: Brett Fairy M.D.   On: 05/19/2022 21:40   CT Abdomen Pelvis W Contrast  Result Date: 05/19/2022 CLINICAL DATA:  Sepsis. Altered mental status and weakness. History of seizures. EXAM: CT ABDOMEN AND PELVIS WITH CONTRAST TECHNIQUE: Multidetector CT imaging of the abdomen and pelvis was performed using the standard protocol following bolus administration of intravenous contrast. RADIATION DOSE REDUCTION: This exam was performed according to the departmental dose-optimization program which includes automated exposure control, adjustment of the mA and/or kV according to patient size and/or use of iterative  reconstruction technique. CONTRAST:  41m OMNIPAQUE IOHEXOL 350 MG/ML SOLN  COMPARISON:  12/31/2021. FINDINGS: Lower chest: Mild patchy infiltrates are noted at the lung bases. Scattered calcified granuloma are present at the lung bases. Hepatobiliary: No focal liver abnormality is seen. Stones are present within the gallbladder. No biliary ductal dilatation. Pancreas: Unremarkable. No pancreatic ductal dilatation or surrounding inflammatory changes. Spleen: Normal in size without focal abnormality. Adrenals/Urinary Tract: The adrenal glands are within normal limits. The kidneys enhance symmetrically. There is a cyst in the lower pole of the right kidney. A subcentimeter hypodensity is present in the mid left kidney which is too small to further characterize. No renal calculus or hydronephrosis. The bladder is unremarkable. Stomach/Bowel: A PEG tube is noted in the stomach. Appendix is not seen. No evidence of bowel wall thickening, distention, or inflammatory changes. No free air or pneumatosis. Vascular/Lymphatic: Aortic atherosclerosis. No enlarged abdominal or pelvic lymph nodes. Reproductive: Uterus and bilateral adnexa are unremarkable. Other: No abdominopelvic ascites. Musculoskeletal: Degenerative changes in the thoracolumbar spine. No acute or suspicious osseous abnormality. IMPRESSION: 1. Mild patchy infiltrates at the lung bases, suspicious for pneumonia. 2. Cholelithiasis. 3. Aortic atherosclerosis. Electronically Signed   By: LBrett FairyM.D.   On: 05/19/2022 21:35   DG Chest Port 1 View  Result Date: 05/19/2022 CLINICAL DATA:  Questionable sepsis EXAM: PORTABLE CHEST 1 VIEW COMPARISON:  Chest x-ray 05/06/2022 FINDINGS: Bilateral calcified granulomas are unchanged. There is no new focal lung infiltrate, pleural effusion or pneumothorax. Cardiomediastinal silhouette is within normal limits. There is stable scoliosis of the thoracic spine. There is a healed right sixth rib fracture. IMPRESSION: No active disease. Electronically Signed   By: ARonney AstersM.D.   On:  05/19/2022 20:34   IR GASTROSTOMY TUBE MOD SED  Result Date: 05/15/2022 INDICATION: History of head and neck cancer, now with worsening dysphagia and aspiration. Please perform percutaneous gastrostomy tube placement for enteric nutrition supplementation purposes. Note, patient with remote history of previous gastrostomy tube. EXAM: PULL TROUGH GASTROSTOMY TUBE PLACEMENT COMPARISON:  CT abdomen pelvis-12/31/2021 MEDICATIONS: Ancef 2 gm IV; Antibiotics were administered within 1 hour of the procedure. Glucagon 1 mg IV CONTRAST:  20 cc Omnipaque 300 administered into the gastric lumen. ANESTHESIA/SEDATION: Moderate (conscious) sedation was employed during this procedure. A total of Versed 1 mg and Fentanyl 50 mcg was administered intravenously. Moderate Sedation Time: 15 minutes. The patient's level of consciousness and vital signs were monitored continuously by radiology nursing throughout the procedure under my direct supervision. FLUOROSCOPY TIME:  2 minutes, 18 seconds (7 mGy) COMPLICATIONS: None immediate. PROCEDURE: Informed written consent was obtained from the patient following explanation of the procedure, risks, benefits and alternatives. A time out was performed prior to the initiation of the procedure. Ultrasound scanning was performed to demarcate the edge of the left lobe of the liver. Maximal barrier sterile technique utilized including caps, mask, sterile gowns, sterile gloves, large sterile drape, hand hygiene and Betadine prep. The left upper quadrant was sterilely prepped and draped. An oral gastric catheter was inserted into the stomach under fluoroscopy. The existing nasogastric feeding tube was removed. The left costal margin and air opacified transverse colon were identified and avoided. Air was injected into the stomach for insufflation and visualization under fluoroscopy. Under sterile conditions a 17 gauge trocar needle was utilized to access the stomach percutaneously beneath the left  subcostal margin after the overlying soft tissues were anesthetized with 1% Lidocaine with epinephrine. Needle position was confirmed within the stomach with aspiration  of air and injection of small amount of contrast. A single T tack was deployed for gastropexy. Over an Amplatz guide wire, a 9-French sheath was inserted into the stomach. A snare device was utilized to capture the oral gastric catheter. The snare device was pulled retrograde from the stomach up the esophagus and out the oropharynx. The 20-French pull-through gastrostomy was connected to the snare device and pulled antegrade through the oropharynx down the esophagus into the stomach and then through the percutaneous tract external to the patient. The gastrostomy was assembled externally. Contrast injection confirms appropriate positioning within the stomach. Several spot radiographic images were obtained in various obliquities for documentation. Dressings were applied. The patient tolerated procedure well without immediate post procedural complication. FINDINGS: After successful fluoroscopic guided placement, the gastrostomy tube is appropriately positioned with internal disc positioned against the inner ventral wall of the gastric lumen. IMPRESSION: Successful fluoroscopic insertion of a 20-French pull-through gastrostomy tube. The gastrostomy may be used immediately for medication administration and in 24 hrs for the initiation of feeds. Electronically Signed   By: Sandi Mariscal M.D.   On: 05/15/2022 16:25   DG Abd Portable 1V  Result Date: 05/08/2022 CLINICAL DATA:  Feeding tube placement. EXAM: PORTABLE ABDOMEN - 1 VIEW COMPARISON:  05/05/2022. FINDINGS: Feeding tube is followed into the stomach with the tip projecting beyond the inferior margin of the image. Bowel gas pattern is grossly unremarkable. Numerous calcified granulomas in the lungs. Lung bases likely atelectasis. IMPRESSION: Feeding tube is followed into the stomach with the tip  projecting beyond the inferior margin of the image. Electronically Signed   By: Lorin Picket M.D.   On: 05/08/2022 10:19   DG CHEST PORT 1 VIEW  Result Date: 05/06/2022 CLINICAL DATA:  [id number].  NGT placement. EXAM: PORTABLE CHEST 1 VIEW COMPARISON:  Portable chest 05/01/2022 FINDINGS: 10:04 p.m. NGT has been placed and terminates at the body of the stomach. The cardiomediastinal silhouette and vascular pattern are normal. The upper lung apices are clipped from the exposure. There is increased left infrahilar opacity today which could be due to atelectasis, pneumonia or aspiration. Remaining lungs are clear apart from scattered calcified granulomas. No pleural effusion is seen or measurable pneumothorax. Reverse S shaped thoracic scoliosis is again shown. IMPRESSION: 1. NGT terminates at the body of the stomach. 2. Increased left infrahilar opacity could be due to atelectasis, pneumonia or aspiration. Attention on follow-up films recommended. Electronically Signed   By: Telford Nab M.D.   On: 05/06/2022 22:21   DG Abd 1 View  Result Date: 05/05/2022 CLINICAL DATA:  Placement of enteric tube EXAM: ABDOMEN - 1 VIEW COMPARISON:  Previous studies including the CT done on 01/19/2022 FINDINGS: Enteric tube is not seen in the radiograph. Bowel gas pattern in the upper abdomen is unremarkable. There are no focal infiltrates in the visualized lung fields. There are multiple calcified granulomas in both lungs. IMPRESSION: Enteric tube is not seen in the imaged. Electronically Signed   By: Elmer Picker M.D.   On: 05/05/2022 17:26   DG Abd Portable 1V  Result Date: 05/05/2022 CLINICAL DATA:  Enteric tube placement EXAM: PORTABLE ABDOMEN - 1 VIEW COMPARISON:  None Available. FINDINGS: Tip of enteric tube is seen in the region of fundus of the stomach. Bowel gas pattern in the upper abdomen is unremarkable. There is no definite evidence of pneumoperitoneum in this semi upright portable study. Lower  abdomen is not included in the image. Low position of diaphragms suggests COPD. There  are multiple calcified nodules in both lungs suggesting healed granulomas. There is no pleural effusion or pneumothorax. Dextroscoliosis is seen in thoracic spine. IMPRESSION: Tip of enteric tube is seen in the fundus of the stomach. Electronically Signed   By: Elmer Picker M.D.   On: 05/05/2022 17:24   MR BRAIN WO CONTRAST  Result Date: 05/05/2022 CLINICAL DATA:  New onset seizure. EXAM: MRI HEAD WITHOUT CONTRAST TECHNIQUE: Multiplanar, multiecho pulse sequences of the brain and surrounding structures were obtained without intravenous contrast. COMPARISON:  CT head 04/30/2022 FINDINGS: Brain: There is no acute intracranial hemorrhage, extra-axial fluid collection, or acute infarct Parenchymal volume is normal. The ventricles are normal in size. Gray-white differentiation is preserved. There are scattered small foci of FLAIR signal abnormality in the subcortical and periventricular white matter. There is no structural or migration abnormality. The corpus callosum is normally formed. The hippocampi are normal in signal and architecture. Is a single punctate chronic microhemorrhage in the right external capsule, nonspecific. The pituitary and suprasellar region are normal. There is no mass lesion. There is no mass effect or midline shift. Vascular: Normal flow voids. Skull and upper cervical spine: Normal marrow signal. Sinuses/Orbits: There is mucosal thickening with layering fluid in the right maxillary sinus. The globes and orbits are unremarkable. Other: There is mild right occipital scalp swelling, similar to the prior CT. There is a small right mastoid effusion. IMPRESSION: 1. No acute intracranial pathology or epileptogenic focus identified. 2. Mild right occipital scalp swelling, similar to the prior CT. 3. Scattered small foci of FLAIR signal abnormality in the supratentorial white matter are nonspecific but may  reflect sequela of chronic small-vessel ischemic change, overall mild but accelerated for age. 4. Layering fluid in the right maxillary sinus which may reflect acute sinusitis in the correct clinical setting. Electronically Signed   By: Valetta Mole M.D.   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 Level of alertness: Awake, asleep AEDs during EEG study: LEV, LCM Technical aspects: This EEG study was done with scalp electrodes positioned according to the 10-20 International system of electrode placement. Electrical activity was reviewed with band pass filter of 1-'70Hz'$ , sensitivity of 7 uV/mm, display speed of 72m/sec with a '60Hz'$  notched filter applied as appropriate. EEG data were recorded continuously and digitally stored.  Video monitoring was available and reviewed as appropriate. Description: During awake state, no clear posterior dominant rhythm was seen. Sleep was characterized by vertex waves, sleep spindles (12 to 14 Hz), maximal frontocentral region.  EEG showed continuous generalized and lateralized left hemisphere 3 to 5 Hz theta-delta slowing as well as 12 to 15 Hz beta activity seen predominantly in right hemisphere.  Sharp waves were noted in left hemisphere, maximal left posterior quadrant which appeared quasiperiodic at 0.5 to 1 Hz. Hyperventilation and photic stimulation were not performed.   ABNORMALITY - Sharp wave, left hemisphere, maximal left posterior quadrant - Continuous slow, generalized and lateralized left hemisphere IMPRESSION: This study showed evidence of epileptogenicity and cortical dysfunction arising from left hemisphere, maximal  left posterior quadrant likely due to underlying structural abnormality.  Additionally there is moderate to severe diffuse encephalopathy, nonspecific to etiology.  No definite seizures were seen during the  study. 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 The following electrodes were missing or displaced: none. Background: There is a poorly sustained posterior dominant rhythm that is better seen on the right than left and she has a frequency of 10 Hz.  There are lateralized periodic discharges with phase reversal at F7 and theta wave morphology which wax and wane without definite evolution throughout the recording.  There is also significant left hemispheric focal slow activity. Photic stimulation: Physiologic driving is now performed EEG Abnormalities: 1) lateralized periodic discharges with theta wave morphology 2) left hemispheric slow activity Clinical Interpretation: This EEG recorded the pattern on the ictal-interictal continuum suggestive of a highly irritable area in the left hemisphere without definite seizure.  Recommend continuous EEG monitoring. Roland Rack, MD [facility] [phone] If 7pm- 7am, please page neurology on call as listed in AMION.'  DG Chest Port 1 View  Result Date: 05/01/2022 CLINICAL DATA:  Seizure EXAM: PORTABLE CHEST 1 VIEW COMPARISON:  04/29/2022 FINDINGS: Unchanged cardiac and mediastinal contours. Chronic scattered calcified pulmonary nodules, likely sequela of prior granulomatous disease. No new focal pulmonary opacity. No pleural effusion or pneumothorax.  Scoliosis. No acute osseous abnormality. IMPRESSION: No acute cardiopulmonary process. Electronically Signed   By: Merilyn Baba M.D.   On: 05/01/2022 00:18   CT Head Wo Contrast  Result Date: 04/30/2022 CLINICAL DATA:  Head trauma, abnormal mental status (Age 19-64y) EXAM: CT HEAD WITHOUT CONTRAST TECHNIQUE: Contiguous axial images were obtained from the base of the skull through the vertex without intravenous contrast. RADIATION DOSE REDUCTION: This exam was performed according to the departmental dose-optimization program which includes automated exposure control, adjustment of the mA and/or kV according to patient size and/or use of iterative reconstruction technique. COMPARISON:  CT head 09/11/2018 FINDINGS: Brain: No evidence of large-territorial acute infarction. No parenchymal hemorrhage. No mass lesion. No extra-axial collection. No mass effect or midline shift. No hydrocephalus. Basilar cisterns are patent. Vascular: No hyperdense vessel. Skull: No acute fracture or focal lesion. Sinuses/Orbits: Right maxillary sinus mucosal thickening. Otherwise paranasal sinuses and mastoid air cells are clear. The orbits are unremarkable. Other: None. IMPRESSION: No acute intracranial abnormality. Electronically Signed   By: Iven Finn M.D.   On: 04/30/2022 21:42   DG Chest 2 View  Result Date: 04/29/2022 CLINICAL DATA:  cough EXAM: CHEST - 2 VIEW COMPARISON:  Chest x-ray 12/30/2021 FINDINGS: The heart and mediastinal contours are within normal limits. Chronic multiple scattered calcified pulmonary nodules likely sequelae of prior granulomatous disease. No focal consolidation. No pulmonary edema. No pleural effusion. No pneumothorax. No acute osseous abnormality. Dextroscoliosis of the midthoracic spine. IMPRESSION: No active cardiopulmonary disease. Electronically Signed   By: Iven Finn M.D.   On: 04/29/2022 20:20     Subjective: No acute issues or events overnight   Discharge Exam: Vitals:    05/22/22 1958 05/23/22 0247  BP: (!) 144/96 132/81  Pulse: 72 71  Resp: 15 16  Temp: (!) 97.5 F (36.4 C) 98.5 F (36.9 C)  SpO2: 98% 98%   Vitals:   05/22/22 0859 05/22/22 1624 05/22/22 1958 05/23/22 0247  BP:  125/88 (!) 133/91 (!) 144/96 132/81  Pulse: 83 73 72 71  Resp: '17  15 16  '$ Temp: 98 F (36.7 C) 97.6 F (36.4 C) (!) 97.5 F (36.4 C) 98.5 F (36.9 C)  TempSrc: Oral Oral Oral Oral  SpO2: 96% 99% 98% 98%  Weight:    43.9 kg    General: Pt is alert, awake, not in acute distress Cardiovascular: RRR, S1/S2 +, no rubs, no gallops Respiratory: CTA bilaterally, no wheezing, no rhonchi Abdominal: Soft, NT, ND, PEG tube intact Extremities: no edema, no cyanosis    The results of significant diagnostics from this hospitalization (including imaging, microbiology, ancillary and laboratory) are listed below for reference.     Microbiology: Recent Results (from the past 240 hour(s))  Blood Culture (routine x 2)     Status: None (Preliminary result)   Collection Time: 05/19/22  8:41 PM   Specimen: BLOOD  Result Value Ref Range Status   Specimen Description BLOOD BLOOD LEFT WRIST  Final   Special Requests AEROBIC BOTTLE ONLY Blood Culture adequate volume  Final   Culture   Final    NO GROWTH 3 DAYS Performed at [facility], [address], [place], [place] [address]    Report Status PENDING  Incomplete  Blood Culture (routine x 2)     Status: None (Preliminary result)   Collection Time: 05/19/22  8:51 PM   Specimen: BLOOD  Result Value Ref Range Status   Specimen Description BLOOD RIGHT ANTECUBITAL  Final   Special Requests   Final    BOTTLES DRAWN AEROBIC AND ANAEROBIC Blood Culture adequate volume   Culture   Final    NO GROWTH 3 DAYS Performed at [facility], [place] [address], [place], [place] [address]    Report Status PENDING  Incomplete  Urine Culture     Status: Abnormal   Collection Time: 05/19/22 11:34 PM   Specimen: In/Out Cath Urine   Result Value Ref Range Status   Specimen Description IN/OUT CATH URINE  Final   Special Requests   Final    NONE Performed at [facility], [place] [address] [place], [place] [address]    Culture 100 COLONIES/mL PSEUDOMONAS AERUGINOSA (A)  Final   Report Status 05/22/2022 FINAL  Final   Organism ID, Bacteria PSEUDOMONAS AERUGINOSA (A)  Final      Susceptibility   Pseudomonas aeruginosa - MIC*    CEFTAZIDIME 4 SENSITIVE Sensitive     CIPROFLOXACIN <=0.25 SENSITIVE Sensitive     GENTAMICIN <=1 SENSITIVE Sensitive     IMIPENEM 1 SENSITIVE Sensitive     PIP/TAZO 8 SENSITIVE Sensitive     CEFEPIME 2 SENSITIVE Sensitive     * 100 COLONIES/mL PSEUDOMONAS AERUGINOSA  MRSA Next Gen by PCR, Nasal     Status: None   Collection Time: 05/20/22  1:04 AM   Specimen: Nasal Mucosa; Nasal Swab  Result Value Ref Range Status   MRSA by PCR Next Gen NOT DETECTED NOT DETECTED Final    Comment: (NOTE) The GeneXpert MRSA Assay (FDA approved for NASAL specimens only), is one component of a comprehensive MRSA colonization surveillance program. It is not intended to diagnose MRSA infection nor to guide or monitor treatment for MRSA infections. Test performance is not FDA approved in patients less than 58 years old. Performed at [facility], [place] [address] [place], [place] [address]   Resp panel by RT-PCR (RSV, Flu A&B, Covid) Anterior Nasal Swab  Status: None   Collection Time: 05/20/22  9:57 AM   Specimen: Anterior Nasal Swab  Result Value Ref Range Status   SARS Coronavirus 2 by RT PCR NEGATIVE NEGATIVE Final    Comment: (NOTE) SARS-CoV-2 target nucleic acids are NOT DETECTED.  The SARS-CoV-2 RNA is generally detectable in upper respiratory specimens during the acute phase of infection. The lowest concentration of SARS-CoV-2 viral copies this assay can detect is 138 copies/mL. A negative result does not preclude SARS-Cov-2 infection and should not be used as the sole basis for  treatment or other patient management decisions. A negative result may occur with  improper specimen collection/handling, submission of specimen other than nasopharyngeal swab, presence of viral mutation(s) within the areas targeted by this assay, and inadequate number of viral copies(<138 copies/mL). A negative result must be combined with clinical observations, patient history, and epidemiological information. The expected result is Negative.  Fact Sheet for Patients:  EntrepreneurPulse.com.au  Fact Sheet for Healthcare Providers:  IncredibleEmployment.be  This test is no t yet approved or cleared by the [place] FDA and  has been authorized for detection and/or diagnosis of SARS-CoV-2 by FDA under an Emergency Use Authorization (EUA). This EUA will remain  in effect (meaning this test can be used) for the duration of the COVID-19 declaration under Section 564(b)(1) of the Act, 21 U.S.C.section 360bbb-3(b)(1), unless the authorization is terminated  or revoked sooner.       Influenza A by PCR NEGATIVE NEGATIVE Final   Influenza B by PCR NEGATIVE NEGATIVE Final    Comment: (NOTE) The Xpert Xpress SARS-CoV-2/FLU/RSV plus assay is intended as an aid in the diagnosis of influenza from Nasopharyngeal swab specimens and should not be used as a sole basis for treatment. Nasal washings and aspirates are unacceptable for Xpert Xpress SARS-CoV-2/FLU/RSV testing.  Fact Sheet for Patients: EntrepreneurPulse.com.au  Fact Sheet for Healthcare Providers: IncredibleEmployment.be  This test is not yet approved or cleared by the [place] FDA and has been authorized for detection and/or diagnosis of SARS-CoV-2 by FDA under an Emergency Use Authorization (EUA). This EUA will remain in effect (meaning this test can be used) for the duration of the COVID-19 declaration under Section 564(b)(1) of the Act, 21  U.S.C. section 360bbb-3(b)(1), unless the authorization is terminated or revoked.     Resp Syncytial Virus by PCR NEGATIVE NEGATIVE Final    Comment: (NOTE) Fact Sheet for Patients: EntrepreneurPulse.com.au  Fact Sheet for Healthcare Providers: IncredibleEmployment.be  This test is not yet approved or cleared by the [place] FDA and has been authorized for detection and/or diagnosis of SARS-CoV-2 by FDA under an Emergency Use Authorization (EUA). This EUA will remain in effect (meaning this test can be used) for the duration of the COVID-19 declaration under Section 564(b)(1) of the Act, 21 U.S.C. section 360bbb-3(b)(1), unless the authorization is terminated or revoked.  Performed at [facility], [place] [address] [place], [place] [address]      Labs: BNP (last 3 results) No results for input(s): "BNP" in the last 8760 hours. Basic Metabolic Panel: Recent Labs  Lab 05/20/22 0421 05/20/22 0807 05/20/22 0807 05/21/22 0319 05/21/22 0805 05/22/22 0637 05/22/22 1737 05/23/22 0501  NA 133* 133*  --  133*  --  130*  --  133*  K 3.1* 3.1*  --  4.1  --  3.4*  --  3.2*  CL 103 103  --  104  --  103  --  104  CO2 22 22  --  21*  --  21*  --  22  GLUCOSE 77 94  --  86  --  102*  --  84  BUN 19 16  --  9  --  12  --  11  CREATININE 0.67 0.67  --  0.72  --  0.66  --  0.78  CALCIUM 7.6* 7.4*  --  7.2*  --  7.6*  --  7.6*  MG  --  1.5*   < > 2.7* 2.6* 1.9 1.7 1.8  PHOS  --  2.2*   < > 2.9 2.6 2.5 2.3* 2.6   < > = values in this interval not displayed.   Liver Function Tests: Recent Labs  Lab 05/19/22 2113  AST 36  ALT 19  ALKPHOS 65  BILITOT 0.3  PROT 4.4*  ALBUMIN 2.1*   No results for input(s): "LIPASE", "AMYLASE" in the last 168 hours. No results for input(s): "AMMONIA" in the last 168 hours. CBC: Recent Labs  Lab 05/19/22 2113 05/19/22 2146 05/20/22 2042 05/21/22 0319 05/21/22 0805 05/22/22 0637 05/23/22 0501   WBC QUESTIONABLE RESULTS, RECOMMEND RECOLLECT TO VERIFY   < > 9.8 10.3 9.4 6.3 4.0  NEUTROABS QUESTIONABLE RESULTS, RECOMMEND RECOLLECT TO VERIFY  --   --   --   --   --   --   HGB QUESTIONABLE RESULTS, RECOMMEND RECOLLECT TO VERIFY   < > 10.3* 10.5* 10.6* 11.2* 11.4*  HCT QUESTIONABLE RESULTS, RECOMMEND RECOLLECT TO VERIFY   < > 30.7* 30.6* 31.1* 33.0* 35.6*  MCV QUESTIONABLE RESULTS, RECOMMEND RECOLLECT TO VERIFY   < > 88.7 88.2 88.9 91.2 92.5  PLT QUESTIONABLE RESULTS, RECOMMEND RECOLLECT TO VERIFY   < > 207 206 217 220 235   < > = values in this interval not displayed.   Cardiac Enzymes: No results for input(s): "CKTOTAL", "CKMB", "CKMBINDEX", "TROPONINI" in the last 168 hours. BNP: Invalid input(s): "POCBNP" CBG: Recent Labs  Lab 05/22/22 0358 05/22/22 1251 05/22/22 1536 05/22/22 1956 05/22/22 2350  GLUCAP 97 92 91 87 95   D-Dimer No results for input(s): "DDIMER" in the last 72 hours. Hgb A1c No results for input(s): "HGBA1C" in the last 72 hours. Lipid Profile No results for input(s): "CHOL", "HDL", "LDLCALC", "TRIG", "CHOLHDL", "LDLDIRECT" in the last 72 hours. Thyroid function studies No results for input(s): "TSH", "T4TOTAL", "T3FREE", "THYROIDAB" in the last 72 hours.  Invalid input(s): "FREET3" Anemia work up No results for input(s): "VITAMINB12", "FOLATE", "FERRITIN", "TIBC", "IRON", "RETICCTPCT" in the last 72 hours. Urinalysis    Component Value Date/Time   COLORURINE STRAW (A) 05/19/2022 2334   APPEARANCEUR CLEAR 05/19/2022 2334   LABSPEC 1.023 05/19/2022 2334   PHURINE 6.0 05/19/2022 [place] NEGATIVE 05/19/2022 2334   HGBUR NEGATIVE 05/19/2022 2334   BILIRUBINUR NEGATIVE 05/19/2022 2334   KETONESUR NEGATIVE 05/19/2022 2334   PROTEINUR NEGATIVE 05/19/2022 2334   UROBILINOGEN 0.2 12/06/2013 1718   NITRITE NEGATIVE 05/19/2022 2334   LEUKOCYTESUR NEGATIVE 05/19/2022 2334   Sepsis Labs Recent Labs  Lab 05/21/22 0319 05/21/22 0805  05/22/22 0637 05/23/22 0501  WBC 10.3 9.4 6.3 4.0   Microbiology Recent Results (from the past 240 hour(s))  Blood Culture (routine x 2)     Status: None (Preliminary result)   Collection Time: 05/19/22  8:41 PM   Specimen: BLOOD  Result Value Ref Range Status   Specimen Description BLOOD BLOOD LEFT WRIST  Final   Special Requests AEROBIC BOTTLE ONLY Blood Culture adequate volume  Final  Culture   Final    NO GROWTH 3 DAYS Performed at [facility], [place] [address] [place], [place] [address]    Report Status PENDING  Incomplete  Blood Culture (routine x 2)     Status: None (Preliminary result)   Collection Time: 05/19/22  8:51 PM   Specimen: BLOOD  Result Value Ref Range Status   Specimen Description BLOOD RIGHT ANTECUBITAL  Final   Special Requests   Final    BOTTLES DRAWN AEROBIC AND ANAEROBIC Blood Culture adequate volume   Culture   Final    NO GROWTH 3 DAYS Performed at [facility], [place] [address], [place], [place] [address]    Report Status PENDING  Incomplete  Urine Culture     Status: Abnormal   Collection Time: 05/19/22 11:34 PM   Specimen: In/Out Cath Urine  Result Value Ref Range Status   Specimen Description IN/OUT CATH URINE  Final   Special Requests   Final    NONE Performed at [facility], [place] [address], [place], [place] [address]    Culture 100 COLONIES/mL PSEUDOMONAS AERUGINOSA (A)  Final   Report Status 05/22/2022 FINAL  Final   Organism ID, Bacteria PSEUDOMONAS AERUGINOSA (A)  Final      Susceptibility   Pseudomonas aeruginosa - MIC*    CEFTAZIDIME 4 SENSITIVE Sensitive     CIPROFLOXACIN <=0.25 SENSITIVE Sensitive     GENTAMICIN <=1 SENSITIVE Sensitive     IMIPENEM 1 SENSITIVE Sensitive     PIP/TAZO 8 SENSITIVE Sensitive     CEFEPIME 2 SENSITIVE Sensitive     * 100 COLONIES/mL PSEUDOMONAS AERUGINOSA  MRSA Next Gen by PCR, Nasal     Status: None   Collection Time: 05/20/22  1:04 AM   Specimen: Nasal Mucosa; Nasal Swab   Result Value Ref Range Status   MRSA by PCR Next Gen NOT DETECTED NOT DETECTED Final    Comment: (NOTE) The GeneXpert MRSA Assay (FDA approved for NASAL specimens only), is one component of a comprehensive MRSA colonization surveillance program. It is not intended to diagnose MRSA infection nor to guide or monitor treatment for MRSA infections. Test performance is not FDA approved in patients less than 71 years old. Performed at [facility], [place] [address] [place], [place] [address]   Resp panel by RT-PCR (RSV, Flu A&B, Covid) Anterior Nasal Swab     Status: None   Collection Time: 05/20/22  9:57 AM   Specimen: Anterior Nasal Swab  Result Value Ref Range Status   SARS Coronavirus 2 by RT PCR NEGATIVE NEGATIVE Final    Comment: (NOTE) SARS-CoV-2 target nucleic acids are NOT DETECTED.  The SARS-CoV-2 RNA is generally detectable in upper respiratory specimens during the acute phase of infection. The lowest concentration of SARS-CoV-2 viral copies this assay can detect is 138 copies/mL. A negative result does not preclude SARS-Cov-2 infection and should not be used as the sole basis for treatment or other patient management decisions. A negative result may occur with  improper specimen collection/handling, submission of specimen other than nasopharyngeal swab, presence of viral mutation(s) within the areas targeted by this assay, and inadequate number of viral copies(<138 copies/mL). A negative result must be combined with clinical observations, patient history, and epidemiological information. The expected result is Negative.  Fact Sheet for Patients:  EntrepreneurPulse.com.au  Fact Sheet for Healthcare Providers:  IncredibleEmployment.be  This test is no t yet approved or cleared by the [place] FDA  and  has been authorized for detection and/or diagnosis of SARS-CoV-2 by FDA under an Emergency Use Authorization (EUA). This EUA  will remain  in effect (meaning this test can be used) for the duration of the COVID-19 declaration under Section 564(b)(1) of the Act, 21 U.S.C.section 360bbb-3(b)(1), unless the authorization is terminated  or revoked sooner.       Influenza A by PCR NEGATIVE NEGATIVE Final   Influenza B by PCR NEGATIVE NEGATIVE Final    Comment: (NOTE) The Xpert Xpress SARS-CoV-2/FLU/RSV plus assay is intended as an aid in the diagnosis of influenza from Nasopharyngeal swab specimens and should not be used as a sole basis for treatment. Nasal washings and aspirates are unacceptable for Xpert Xpress SARS-CoV-2/FLU/RSV testing.  Fact Sheet for Patients: EntrepreneurPulse.com.au  Fact Sheet for Healthcare Providers: IncredibleEmployment.be  This test is not yet approved or cleared by the [place] FDA and has been authorized for detection and/or diagnosis of SARS-CoV-2 by FDA under an Emergency Use Authorization (EUA). This EUA will remain in effect (meaning this test can be used) for the duration of the COVID-19 declaration under Section 564(b)(1) of the Act, 21 U.S.C. section 360bbb-3(b)(1), unless the authorization is terminated or revoked.     Resp Syncytial Virus by PCR NEGATIVE NEGATIVE Final    Comment: (NOTE) Fact Sheet for Patients: EntrepreneurPulse.com.au  Fact Sheet for Healthcare Providers: IncredibleEmployment.be  This test is not yet approved or cleared by the [place] FDA and has been authorized for detection and/or diagnosis of SARS-CoV-2 by FDA under an Emergency Use Authorization (EUA). This EUA will remain in effect (meaning this test can be used) for the duration of the COVID-19 declaration under Section 564(b)(1) of the Act, 21 U.S.C. section 360bbb-3(b)(1), unless the authorization is terminated or revoked.  Performed at [facility], [place] [address] [place], [place] [address]       Time coordinating discharge: Over 30 minutes  SIGNED:   Little Ishikawa, DO [facility] 05/23/2022, 7:33 AM Pager   If 7PM-7AM, please contact night-coverage [email or web address]

## 2022-05-24 ENCOUNTER — Encounter (HOSPITAL_COMMUNITY): Payer: Self-pay | Admitting: Gastroenterology

## 2022-05-24 LAB — CULTURE, BLOOD (ROUTINE X 2)
Culture: NO GROWTH
Culture: NO GROWTH
Special Requests: ADEQUATE
Special Requests: ADEQUATE

## 2022-05-24 LAB — SURGICAL PATHOLOGY

## 2022-05-26 ENCOUNTER — Emergency Department (HOSPITAL_COMMUNITY)
Admission: EM | Admit: 2022-05-26 | Discharge: 2022-05-26 | Disposition: A | Discharge status: Home / Self Care | Payer: Medicaid Other | Attending: Emergency Medicine | Admitting: Emergency Medicine

## 2022-05-26 ENCOUNTER — Other Ambulatory Visit: Payer: Self-pay

## 2022-05-26 ENCOUNTER — Encounter (HOSPITAL_COMMUNITY): Payer: Self-pay | Admitting: Emergency Medicine

## 2022-05-26 ENCOUNTER — Emergency Department (HOSPITAL_COMMUNITY): Payer: Medicaid Other

## 2022-05-26 DIAGNOSIS — I10 Essential (primary) hypertension: Secondary | ICD-10-CM | POA: Insufficient documentation

## 2022-05-26 DIAGNOSIS — R4182 Altered mental status, unspecified: Secondary | ICD-10-CM | POA: Insufficient documentation

## 2022-05-26 DIAGNOSIS — Z8521 Personal history of malignant neoplasm of larynx: Secondary | ICD-10-CM | POA: Diagnosis not present

## 2022-05-26 DIAGNOSIS — E876 Hypokalemia: Secondary | ICD-10-CM | POA: Insufficient documentation

## 2022-05-26 DIAGNOSIS — R1032 Left lower quadrant pain: Secondary | ICD-10-CM | POA: Insufficient documentation

## 2022-05-26 DIAGNOSIS — Z79899 Other long term (current) drug therapy: Secondary | ICD-10-CM | POA: Insufficient documentation

## 2022-05-26 LAB — COMPREHENSIVE METABOLIC PANEL
ALT: 20 U/L (ref 0–44)
AST: 35 U/L (ref 15–41)
Albumin: 3 g/dL — ABNORMAL LOW (ref 3.5–5.0)
Alkaline Phosphatase: 103 U/L (ref 38–126)
Anion gap: 11 (ref 5–15)
BUN: <5 mg/dL — ABNORMAL LOW (ref 6–20)
CO2: 25 mmol/L (ref 22–32)
Calcium: 8.6 mg/dL — ABNORMAL LOW (ref 8.9–10.3)
Chloride: 97 mmol/L — ABNORMAL LOW (ref 98–111)
Creatinine, Ser: 0.71 mg/dL (ref 0.44–1.00)
GFR, Estimated: >60 mL/min (ref >60)
Glucose, Bld: 71 mg/dL (ref 70–99)
Potassium: 2.8 mmol/L — ABNORMAL LOW (ref 3.5–5.1)
Sodium: 133 mmol/L — ABNORMAL LOW (ref 135–145)
Total Bilirubin: 0.8 mg/dL (ref 0.3–1.2)
Total Protein: 6.2 g/dL — ABNORMAL LOW (ref 6.5–8.1)

## 2022-05-26 LAB — CBC
HCT: 37.6 % (ref 36.0–46.0)
Hemoglobin: 12.4 g/dL (ref 12.0–15.0)
MCH: 30.3 pg (ref 26.0–34.0)
MCHC: 33 g/dL (ref 30.0–36.0)
MCV: 91.9 fL (ref 80.0–100.0)
Platelets: 384 10*3/uL (ref 150–400)
RBC: 4.09 MIL/uL (ref 3.87–5.11)
RDW: 18.3 % — ABNORMAL HIGH (ref 11.5–15.5)
WBC: 4.4 10*3/uL (ref 4.0–10.5)
nRBC: 0 % (ref 0.0–0.2)

## 2022-05-26 LAB — CBG MONITORING, ED: Glucose-Capillary: 84 mg/dL (ref 70–99)

## 2022-05-26 MED ORDER — POTASSIUM CHLORIDE CRYS ER 20 MEQ PO TBCR
40.0000 meq | EXTENDED_RELEASE_TABLET | Freq: Once | ORAL | Status: AC
  Administered 2022-05-26: 40 meq via ORAL
  Filled 2022-05-26: qty 2

## 2022-05-26 NOTE — ED Notes (Signed)
Notified provider of pts vital signs

## 2022-05-26 NOTE — ED Provider Notes (Signed)
[facility] EMERGENCY DEPARTMENT Provider Note   CSN: [id number] Arrival date & time: 05/26/22  1615     History  Chief Complaint  Patient presents with   Altered Mental Status    Crystal Spence is a 47 y.o. female.  HPI   47 year old female with past medical history of laryngeal cancer status postradiation, HTN, recent admission for altered mental status presents to the emergency department for AMS.  Friend found the patient sitting in the chair "not making sense".  For EMS patient was following commands, ambulated to the stretcher unassisted.  Patient herself has only mention left lower quadrant abdominal pain but otherwise is a very poor historian.  Home Medications Prior to Admission medications   Medication Sig Start Date End Date Taking? Authorizing Provider  acetaminophen (TYLENOL) 500 MG tablet Take 500-1,000 mg by mouth every 6 (six) hours as needed for mild pain or headache.    [provider]  diltiazem (CARDIZEM) 30 MG tablet Place 1 tablet (30 mg total) into feeding tube 2 (two) times daily. 05/17/22 11/08/23  Bonnielee Haff, MD  folic acid (FOLVITE) 1 MG tablet Place 1 tablet (1 mg total) into feeding tube daily. 05/23/22   Little Ishikawa, MD  lacosamide 100 MG TABS Place 1 tablet (100 mg total) into feeding tube 2 (two) times daily. 05/17/22   Bonnielee Haff, MD  levETIRAcetam (KEPPRA) 1000 MG tablet Place 1 tablet (1,000 mg total) into feeding tube 2 (two) times daily. 05/17/22   Bonnielee Haff, MD  levothyroxine (SYNTHROID) 75 MCG tablet Place 1 tablet (75 mcg total) into feeding tube daily before breakfast. 05/18/22   Bonnielee Haff, MD  Multiple Vitamin (MULTIVITAMIN WITH MINERALS) TABS tablet Place 1 tablet into feeding tube daily. 05/23/22   Little Ishikawa, MD  Nutritional Supplements (FEEDING SUPPLEMENT, OSMOLITE 1.2 CAL,) LIQD Place 355 mLs into feeding tube 4 (four) times daily - after meals and at bedtime. 05/17/22   Bonnielee Haff, MD  pantoprazole sodium (PROTONIX) 40 mg Place 40 mg into feeding tube 2 (two) times daily. 05/23/22   Little Ishikawa, MD  polyethylene glycol (MIRALAX / GLYCOLAX) 17 g packet Place 17 g into feeding tube daily as needed. Patient taking differently: Place 17 g into feeding tube daily as needed for mild constipation or moderate constipation. 05/17/22   Bonnielee Haff, MD  senna-docusate (SENOKOT-S) 8.6-50 MG tablet Place 2 tablets into feeding tube at bedtime. 05/17/22   Bonnielee Haff, MD  thiamine (VITAMIN B-1) 100 MG tablet Place 1 tablet (100 mg total) into feeding tube daily. 05/23/22   Little Ishikawa, MD  traZODone (DESYREL) 100 MG tablet Take 100 mg by mouth at bedtime as needed for sleep.    [provider]  Water For Irrigation, Sterile (FREE WATER) SOLN Place 100 mLs into feeding tube every 6 (six) hours. 05/17/22   Bonnielee Haff, MD      Allergies    Patient has no known allergies.    Review of Systems   Review of Systems  Unable to perform ROS: Mental status change    Physical Exam Updated Vital Signs BP (!) 189/118   Pulse 96   Temp 98.3 F (36.8 C) (Oral)   Resp 20   Ht '5\' 1"'$  (1.549 m)   Wt 44 kg   LMP 10/20/2012 Comment: pt. has had tubal ligation,no longer has peroids  SpO2 100%   BMI 18.33 kg/m  Physical Exam Vitals and nursing note reviewed.  Constitutional:  General: She is not in acute distress.    Comments: Disheveled appearance  HENT:     Head: Normocephalic.     Mouth/Throat:     Mouth: Mucous membranes are dry.  Eyes:     Extraocular Movements: Extraocular movements intact.     Pupils: Pupils are equal, round, and reactive to light.  Cardiovascular:     Rate and Rhythm: Normal rate.  Pulmonary:     Effort: Pulmonary effort is normal. No respiratory distress.  Abdominal:     General: There is no distension.     Palpations: Abdomen is soft.     Tenderness: There is no abdominal tenderness. There is no guarding.   Musculoskeletal:        General: No deformity.  Skin:    General: Skin is warm.  Neurological:     Mental Status: She is alert.     Comments: Oriented to herself and location but otherwise appears confused and at times does not answer questions  Psychiatric:        Mood and Affect: Mood normal.     ED Results / Procedures / Treatments   Labs (all labs ordered are listed, but only abnormal results are displayed) Labs Reviewed  COMPREHENSIVE METABOLIC PANEL - Abnormal; Notable for the following components:      Result Value   Sodium 133 (*)    Potassium 2.8 (*)    Chloride 97 (*)    BUN <5 (*)    Calcium 8.6 (*)    Total Protein 6.2 (*)    Albumin 3.0 (*)    All other components within normal limits  CBC - Abnormal; Notable for the following components:   RDW 18.3 (*)    All other components within normal limits  URINALYSIS, ROUTINE W REFLEX MICROSCOPIC  RAPID URINE DRUG SCREEN, HOSP PERFORMED  CBG MONITORING, ED    EKG None  Radiology No results found.  Procedures Procedures    Medications Ordered in ED Medications - No data to display  ED Course/ Medical Decision Making/ A&P                             Medical Decision Making Amount and/or Complexity of Data Reviewed Labs: ordered. Radiology: ordered.  Risk Prescription drug management.   47 year old female presents emergency department after episode of confusion.  Hypertensive on arrival but otherwise normal vitals.  Patient is oriented, minimal historian but appears appropriate.  No acute neurodeficit noted.  Blood work is normal and baseline besides hypokalemia at 2.8.  CT of the head and neck are unremarkable.  EKG shows no acute changes for the patient.  On reevaluation abdomen is benign, she appears baseline mentation, has no complaints.  Vitals are normalized she feels well.  Declines giving urine sample.  Patient at this time appears safe and stable for discharge and close outpatient follow  up. Discharge plan and strict return to ED precautions discussed, patient verbalizes understanding and agreement.        Final Clinical Impression(s) / ED Diagnoses Final diagnoses:  None    Rx / DC Orders ED Discharge Orders     None         Lorelle Gibbs, DO 05/26/22 2118

## 2022-05-26 NOTE — Discharge Instructions (Signed)
You have been seen and discharged from the emergency department.  Your potassium was found to be low, you were given replacement here in the department.  Stay well-hydrated.  Follow-up with your primary provider for further evaluation and further care. Take home medications as prescribed. If you have any worsening symptoms or further concerns for your health please return to an emergency department for further evaluation.

## 2022-05-26 NOTE — ED Triage Notes (Signed)
Pt via EMS from local [facility] since yesterday, found in a chair not making sense. Alert to person/place only. Pt seems confused, stating that she is unable to move her arms, although she is moving all extremities without assistance. Follows some commands intermittently but also seems to have difficulty focusing on tasks and instructions. Pt states she is unable to walk but ambulated to EMS stretcher unassisted. LKW unknown/unclear per visitors on scene.   BP 162/90 HR 78 RR 20 O2 98% RA CBG 97 GCS 14

## 2022-06-01 ENCOUNTER — Other Ambulatory Visit: Payer: Self-pay

## 2022-06-01 ENCOUNTER — Emergency Department (HOSPITAL_COMMUNITY): Payer: Medicaid Other

## 2022-06-01 ENCOUNTER — Inpatient Hospital Stay (HOSPITAL_COMMUNITY)
Admission: EM | Admit: 2022-06-01 | Discharge: 2022-06-03 | DRG: 100 | Disposition: A | Discharge status: Home / Self Care | Payer: Medicaid Other | Attending: Internal Medicine | Admitting: Internal Medicine

## 2022-06-01 DIAGNOSIS — Z681 Body mass index (BMI) 19 or less, adult: Secondary | ICD-10-CM

## 2022-06-01 DIAGNOSIS — R1312 Dysphagia, oropharyngeal phase: Secondary | ICD-10-CM | POA: Diagnosis present

## 2022-06-01 DIAGNOSIS — K259 Gastric ulcer, unspecified as acute or chronic, without hemorrhage or perforation: Secondary | ICD-10-CM | POA: Insufficient documentation

## 2022-06-01 DIAGNOSIS — I10 Essential (primary) hypertension: Secondary | ICD-10-CM | POA: Diagnosis present

## 2022-06-01 DIAGNOSIS — R918 Other nonspecific abnormal finding of lung field: Secondary | ICD-10-CM | POA: Diagnosis present

## 2022-06-01 DIAGNOSIS — Z79899 Other long term (current) drug therapy: Secondary | ICD-10-CM

## 2022-06-01 DIAGNOSIS — G40101 Localization-related (focal) (partial) symptomatic epilepsy and epileptic syndromes with simple partial seizures, not intractable, with status epilepticus: Principal | ICD-10-CM | POA: Diagnosis present

## 2022-06-01 DIAGNOSIS — E039 Hypothyroidism, unspecified: Secondary | ICD-10-CM | POA: Diagnosis present

## 2022-06-01 DIAGNOSIS — E43 Unspecified severe protein-calorie malnutrition: Secondary | ICD-10-CM | POA: Diagnosis present

## 2022-06-01 DIAGNOSIS — Z8521 Personal history of malignant neoplasm of larynx: Secondary | ICD-10-CM

## 2022-06-01 DIAGNOSIS — Z931 Gastrostomy status: Secondary | ICD-10-CM

## 2022-06-01 DIAGNOSIS — Z87891 Personal history of nicotine dependence: Secondary | ICD-10-CM

## 2022-06-01 DIAGNOSIS — G40901 Epilepsy, unspecified, not intractable, with status epilepticus: Secondary | ICD-10-CM | POA: Diagnosis present

## 2022-06-01 DIAGNOSIS — R569 Unspecified convulsions: Secondary | ICD-10-CM

## 2022-06-01 DIAGNOSIS — Z79891 Long term (current) use of opiate analgesic: Secondary | ICD-10-CM

## 2022-06-01 DIAGNOSIS — Z79890 Hormone replacement therapy (postmenopausal): Secondary | ICD-10-CM

## 2022-06-01 DIAGNOSIS — D75839 Thrombocytosis, unspecified: Secondary | ICD-10-CM | POA: Diagnosis present

## 2022-06-01 DIAGNOSIS — Z8711 Personal history of peptic ulcer disease: Secondary | ICD-10-CM

## 2022-06-01 DIAGNOSIS — Z923 Personal history of irradiation: Secondary | ICD-10-CM

## 2022-06-01 LAB — COMPREHENSIVE METABOLIC PANEL
ALT: 21 U/L (ref 0–44)
AST: 46 U/L — ABNORMAL HIGH (ref 15–41)
Albumin: 2.9 g/dL — ABNORMAL LOW (ref 3.5–5.0)
Alkaline Phosphatase: 89 U/L (ref 38–126)
Anion gap: 8 (ref 5–15)
BUN: 11 mg/dL (ref 6–20)
CO2: 25 mmol/L (ref 22–32)
Calcium: 9.1 mg/dL (ref 8.9–10.3)
Chloride: 103 mmol/L (ref 98–111)
Creatinine, Ser: 0.93 mg/dL (ref 0.44–1.00)
GFR, Estimated: >60 mL/min (ref >60)
Glucose, Bld: 81 mg/dL (ref 70–99)
Potassium: 4.2 mmol/L (ref 3.5–5.1)
Sodium: 136 mmol/L (ref 135–145)
Total Bilirubin: 0.4 mg/dL (ref 0.3–1.2)
Total Protein: 6.2 g/dL — ABNORMAL LOW (ref 6.5–8.1)

## 2022-06-01 LAB — CBC WITH DIFFERENTIAL/PLATELET
Abs Immature Granulocytes: 0.02 10*3/uL (ref 0.00–0.07)
Basophils Absolute: 0.1 10*3/uL (ref 0.0–0.1)
Basophils Relative: 1 %
Eosinophils Absolute: 0.2 10*3/uL (ref 0.0–0.5)
Eosinophils Relative: 2 %
HCT: 40.9 % (ref 36.0–46.0)
Hemoglobin: 12.9 g/dL (ref 12.0–15.0)
Immature Granulocytes: 0 %
Lymphocytes Relative: 5 %
Lymphs Abs: 0.5 10*3/uL — ABNORMAL LOW (ref 0.7–4.0)
MCH: 29.6 pg (ref 26.0–34.0)
MCHC: 31.5 g/dL (ref 30.0–36.0)
MCV: 93.8 fL (ref 80.0–100.0)
Monocytes Absolute: 0.4 10*3/uL (ref 0.1–1.0)
Monocytes Relative: 4 %
Neutro Abs: 8.4 10*3/uL — ABNORMAL HIGH (ref 1.7–7.7)
Neutrophils Relative %: 88 %
Platelets: 498 10*3/uL — ABNORMAL HIGH (ref 150–400)
RBC: 4.36 MIL/uL (ref 3.87–5.11)
RDW: 17.4 % — ABNORMAL HIGH (ref 11.5–15.5)
WBC: 9.6 10*3/uL (ref 4.0–10.5)
nRBC: 0 % (ref 0.0–0.2)

## 2022-06-01 LAB — ETHANOL: Alcohol, Ethyl (B): <10 mg/dL (ref <10)

## 2022-06-01 MED ORDER — ADULT MULTIVITAMIN W/MINERALS CH
1.0000 | ORAL_TABLET | Freq: Every day | ORAL | Status: DC
  Filled 2022-06-01: qty 1

## 2022-06-01 MED ORDER — LORAZEPAM 2 MG/ML IJ SOLN: 1.0000 mg | Freq: Once | INTRAMUSCULAR | Status: DC | PRN

## 2022-06-01 MED ORDER — LORAZEPAM 2 MG/ML IJ SOLN
1.0000 mg | Freq: Once | INTRAMUSCULAR | Status: AC
  Administered 2022-06-01: 1 mg via INTRAVENOUS

## 2022-06-01 MED ORDER — FOLIC ACID 1 MG PO TABS
1.0000 mg | ORAL_TABLET | Freq: Every day | ORAL | Status: DC
  Filled 2022-06-01: qty 1

## 2022-06-01 MED ORDER — ORAL CARE MOUTH RINSE: 15.0000 mL | OROMUCOSAL | Status: DC

## 2022-06-01 MED ORDER — THIAMINE HCL 100 MG/ML IJ SOLN
100.0000 mg | Freq: Every day | INTRAMUSCULAR | Status: DC
  Administered 2022-06-01: 100 mg via INTRAVENOUS
  Filled 2022-06-01: qty 2

## 2022-06-01 MED ORDER — LORAZEPAM 2 MG/ML IJ SOLN: 1.0000 mg | INTRAMUSCULAR | Status: DC | PRN

## 2022-06-01 MED ORDER — LABETALOL HCL 5 MG/ML IV SOLN: 5.0000 mg | INTRAVENOUS | Status: DC | PRN

## 2022-06-01 MED ORDER — SODIUM CHLORIDE 0.9 % IV SOLN
100.0000 mg | Freq: Two times a day (BID) | INTRAVENOUS | Status: DC
  Administered 2022-06-02 – 2022-06-03 (×3): 100 mg via INTRAVENOUS
  Filled 2022-06-01 (×4): qty 10

## 2022-06-01 MED ORDER — SODIUM CHLORIDE 0.9 % IV SOLN
200.0000 mg | Freq: Once | INTRAVENOUS | Status: AC
  Administered 2022-06-01: 200 mg via INTRAVENOUS
  Filled 2022-06-01: qty 20

## 2022-06-01 MED ORDER — LORAZEPAM 2 MG/ML IJ SOLN
1.0000 mg | Freq: Once | INTRAMUSCULAR | Status: AC
  Administered 2022-06-01: 1 mg via INTRAVENOUS
  Filled 2022-06-01: qty 1

## 2022-06-01 MED ORDER — THIAMINE MONONITRATE 100 MG PO TABS
100.0000 mg | ORAL_TABLET | Freq: Every day | ORAL | Status: DC
  Filled 2022-06-01: qty 1

## 2022-06-01 MED ORDER — LORAZEPAM 1 MG PO TABS: 1.0000 mg | ORAL_TABLET | ORAL | Status: DC | PRN

## 2022-06-01 MED ORDER — SODIUM CHLORIDE 0.9 % IV SOLN
2500.0000 mg | Freq: Once | INTRAVENOUS | Status: AC
  Administered 2022-06-01: 2500 mg via INTRAVENOUS
  Filled 2022-06-01: qty 25

## 2022-06-01 MED ORDER — ORAL CARE MOUTH RINSE: 15.0000 mL | OROMUCOSAL | Status: DC | PRN

## 2022-06-01 MED ORDER — LEVETIRACETAM IN NACL 1500 MG/100ML IV SOLN
1500.0000 mg | Freq: Two times a day (BID) | INTRAVENOUS | Status: DC
  Administered 2022-06-02 – 2022-06-03 (×3): 1500 mg via INTRAVENOUS
  Filled 2022-06-01 (×6): qty 100

## 2022-06-01 NOTE — Consult Note (Signed)
Neurology Consultation  Reason for Consult: Focal seizures Referring Physician: Dr. Alvino Chapel  CC: "My right arm" "I feel bad"   History is obtained from: Patient, chart review  HPI: Crystal Spence is a 47 y.o. female with a medical history significant for seizures on home Vimpat and Keppra who presented to the ED on 06/01/2022 for evaluation of right arm tremor.  She states that her right arm started jerking today while watching TV and she is unable to stop the arm from jerking.  On evaluation, every 2-3 minutes patient has jerking of the right arm that is at times rhythmic.  The arm begins to tremor and then is raised towards her face or over her head and she uses the left arm to prevent the right arm for hitting her in the face or to stabilize the arm.  She retains consciousness throughout the right arm jerking.  Patient was administered 1 mg of IV Ativan prior to neurology assessment.  Regarding patient's seizure history, she was seen in December 2023 for status epilepticus in the setting of flu in addition to concern for missed antiepileptic drug doses and/or alcohol withdrawal.  EEG at that time showed a left hemisphere focus of epileptogenicity and cortical dysfunction. She was discharged on Vimpat and Keppra. Patient endorses compliance with her AED medications but also states that she does not know what she takes for seizures to the EDP. She denies consuming any alcohol since her previous admission in December of 2023.   ROS: A complete ROS was performed and is negative except as noted in the HPI.   Past Medical History:  Diagnosis Date   Anemia, unspecified 06/17/2013   Headache    History of laryngeal cancer 02/03/2013   Hypertension    Hypothyroidism (acquired) 06/17/2013   Multiple lung nodules 06/28/2014   Pneumonia 01/14/2014, 04/2017   Poor oral hygiene 06/17/2013   Rib pain 01/14/2014   Rib pain on left side 01/05/2014   Throat cancer [facility])    Past Surgical History:  Procedure  Laterality Date   ABDOMINAL SURGERY     BIOPSY  01/18/2018   Procedure: BIOPSY;  Surgeon: Wonda Horner, MD;  Location: [facility] ENDOSCOPY;  Service: Endoscopy;;   BIOPSY  05/21/2022   Procedure: BIOPSY;  Surgeon: Ronnette Juniper, MD;  Location: [place];  Service: Gastroenterology;;   COLONOSCOPY WITH PROPOFOL N/A 10/12/2015   Procedure: COLONOSCOPY WITH PROPOFOL;  Surgeon: Wonda Horner, MD;  Location: [facility] ENDOSCOPY;  Service: Endoscopy;  Laterality: N/A;   ESOPHAGOGASTRODUODENOSCOPY N/A 02/03/2013   Procedure: ESOPHAGOGASTRODUODENOSCOPY (EGD);  Surgeon: Juanita Craver, MD;  Location: [facility] ENDOSCOPY;  Service: Endoscopy;  Laterality: N/A;   ESOPHAGOGASTRODUODENOSCOPY N/A 10/12/2015   Procedure: ESOPHAGOGASTRODUODENOSCOPY (EGD);  Surgeon: Wonda Horner, MD;  Location: [facility] ENDOSCOPY;  Service: Endoscopy;  Laterality: N/A;   ESOPHAGOGASTRODUODENOSCOPY (EGD) WITH PROPOFOL N/A 01/18/2018   Procedure: ESOPHAGOGASTRODUODENOSCOPY (EGD) WITH PROPOFOL;  Surgeon: Wonda Horner, MD;  Location: [facility] ENDOSCOPY;  Service: Endoscopy;  Laterality: N/A;   ESOPHAGOGASTRODUODENOSCOPY (EGD) WITH PROPOFOL N/A 05/21/2022   Procedure: ESOPHAGOGASTRODUODENOSCOPY (EGD) WITH PROPOFOL;  Surgeon: Ronnette Juniper, MD;  Location: [place];  Service: Gastroenterology;  Laterality: N/A;   IR GASTROSTOMY TUBE MOD SED  05/15/2022   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 She has a 15.00 pack-year smoking history. She has never used smokeless tobacco. She reports current alcohol use of about 2.0 standard drinks of alcohol per week. She reports that she does not use  drugs.  Medications No current facility-administered medications for this encounter.  Current Outpatient Medications:    acetaminophen (TYLENOL) 500 MG tablet, Take 500-1,000 mg by mouth every 6 (six) hours as needed for mild pain or headache., Disp: , Rfl:    diltiazem (CARDIZEM) 30 MG tablet, Place 1 tablet (30 mg total) into feeding tube 2 (two) times daily., Disp: 90 tablet, Rfl: 11   folic acid (FOLVITE) 1 MG tablet, Place 1 tablet (1 mg total) into feeding tube daily., Disp: 30 tablet, Rfl: 0   lacosamide 100 MG TABS, Place 1 tablet (100 mg total) into feeding tube 2 (two) times daily., Disp: 60 tablet, Rfl: 0   levETIRAcetam (KEPPRA) 1000 MG tablet, Place 1 tablet (1,000 mg total) into feeding tube 2 (two) times daily., Disp: , Rfl:    levothyroxine (SYNTHROID) 75 MCG tablet, Place 1 tablet (75 mcg total) into feeding tube daily before breakfast., Disp: , Rfl:    Multiple Vitamin (MULTIVITAMIN WITH MINERALS) TABS tablet, Place 1 tablet into feeding tube daily., Disp: 30 tablet, Rfl: 0   Nutritional Supplements (FEEDING SUPPLEMENT, OSMOLITE 1.2 CAL,) LIQD, Place 355 mLs into feeding tube 4 (four) times daily - after meals and at bedtime., Disp: , Rfl: 0   pantoprazole sodium (PROTONIX) 40 mg, Place 40 mg into feeding tube 2 (two) times daily., Disp: 60 packet, Rfl: 1   polyethylene glycol (MIRALAX / GLYCOLAX) 17 g packet, Place 17 g into feeding tube daily as needed. (Patient taking differently: Place 17 g into feeding tube daily as needed for mild constipation or moderate constipation.), Disp: 14 each, Rfl: 0   senna-docusate (SENOKOT-S) 8.6-50 MG tablet, Place 2 tablets into feeding tube at bedtime., Disp: , Rfl:    thiamine (VITAMIN B-1) 100 MG tablet, Place 1 tablet (100 mg total) into feeding tube daily., Disp: 30 tablet, Rfl: 0   traZODone (DESYREL) 100 MG tablet, Take 100 mg by mouth at bedtime as needed for sleep., Disp: , Rfl:    Water For Irrigation, Sterile (FREE WATER) SOLN,  Place 100 mLs into feeding tube every 6 (six) hours., Disp: , Rfl:   Exam: Current vital signs: BP (!) 159/99 (BP Location: Left Arm)   Pulse (!) 101   Temp 98 F (36.7 C) (Oral)   Resp 14   Ht '5\' 1"'$  (1.549 m)   Wt 44 kg   LMP 10/20/2012 Comment: pt. has had tubal ligation,no longer has peroids  SpO2 100%   BMI 18.33 kg/m  Vital signs in last 24 hours: Temp:  [98 F (36.7 C)] 98 F (36.7 C) (01/20 1619) Pulse Rate:  [101-104] 101 (01/20 1645) Resp:  [14-23] 14 (01/20 1645) BP: (159-182)/(99-119) 159/99 (01/20 1645) SpO2:  [90 %-100 %] 100 % (01/20 1645) Weight:  [44 kg] 44 kg (01/20 1618)  GENERAL: Thin appearing female, awake, alert, in no acute distress Psych: Affect appropriate for situation, patient is calm and cooperative with examination.  Head: Normocephalic and atraumatic, without obvious abnormality EENT: Normal conjunctivae, dry mucous membranes, no OP obstruction, or dentition LUNGS: Normal respiratory effort. Non-labored breathing on room air  CV: Regular rate and rhythm on telemetry, intermittently tachycardic ABDOMEN: Soft, non-tender, non-distended Extremities: Warm, well perfused, without obvious deformity  NEURO:  Mental Status: Awake, alert, and oriented to self, situation, and place.  She is able to state her age and the current month correctly.  She is unable to state the current year.   She follows commands with repeat instruction. Unable to follow commands with right upper extremity during twitching motions. Patient's speech is significantly hypophonic.  She does not speak above a whisper. No aphasia or dysarthria noted. Cranial Nerves:  II: PERRL. Visual fields full.  III, IV, VI: EOMI without gaze preference V: Sensation is intact to light touch and symmetrical to face.  VII: Face is symmetric resting and smiling.  VIII: Hearing intact to voice IX, X: Hypophonic XI: Normal sternocleidomastoid and trapezius muscle strength XII: Tongue protrudes  midline   Motor: 5/5 strength is all muscle groups without vertical drift. Patient winces with assessment of the right upper extremity. Patient's right upper extremity has intermittent rhythmic jerking lasting approximately 30 seconds and occurring every 2-3 minutes. She is unable to follow commands when the jerking occurs. She uses her left hand to brace the right hand during jerking movements.  Tone is normal. Bulk is decreased throughout.  Sensation: Intact to light touch bilaterally in all four extremities. No extinction to DSS present.  Coordination: No dysmetria Gait: Deferred for patient safety  Labs I have reviewed labs in epic and the results pertinent to this consultation are: CBC    Component Value Date/Time   WBC 4.4 05/26/2022 1707   RBC 4.09 05/26/2022 1707   HGB 12.4 05/26/2022 1707   HGB 10.3 (L) 12/14/2015 1412   HCT 37.6 05/26/2022 1707   HCT 34.1 (L) 12/14/2015 1412   PLT 384 05/26/2022 1707   PLT 154 12/14/2015 1412   MCV 91.9 05/26/2022 1707   MCV 90.0 12/14/2015 1412   MCH 30.3 05/26/2022 1707   MCHC 33.0 05/26/2022 1707   RDW 18.3 (H) 05/26/2022 1707   RDW 20.7 (H) 12/14/2015 1412   LYMPHSABS  05/19/2022 2113    QUESTIONABLE RESULTS, RECOMMEND RECOLLECT TO VERIFY   LYMPHSABS 0.4 (L) 12/14/2015 1412   MONOABS  05/19/2022 2113    QUESTIONABLE RESULTS, RECOMMEND RECOLLECT TO VERIFY   MONOABS 0.4 12/14/2015 1412   EOSABS  05/19/2022 2113    QUESTIONABLE RESULTS, RECOMMEND RECOLLECT TO VERIFY   EOSABS 0.1 12/14/2015 1412   BASOSABS  05/19/2022 2113    QUESTIONABLE RESULTS, RECOMMEND RECOLLECT TO VERIFY   BASOSABS 0.0 12/14/2015 1412   CMP     Component Value Date/Time   NA 133 (L) 05/26/2022 1707   NA 138 12/14/2015 1412   K 2.8 (L) 05/26/2022 1707   K 3.8 12/14/2015 1412   CL 97 (L) 05/26/2022 1707   CO2 25 05/26/2022 1707   CO2 20 (L) 12/14/2015 1412   GLUCOSE 71 05/26/2022 1707   GLUCOSE 77 12/14/2015 1412   BUN <5 (L) 05/26/2022 1707   BUN  4.6 (L) 12/14/2015 1412   CREATININE 0.71 05/26/2022 1707   CREATININE 0.7 12/14/2015 1412   CALCIUM 8.6 (L) 05/26/2022 1707   CALCIUM 9.4 12/14/2015 1412   PROT 6.2 (L) 05/26/2022 1707   PROT 7.3 12/14/2015 1412   ALBUMIN 3.0 (L) 05/26/2022 1707   ALBUMIN 3.8 12/14/2015 1412   AST 35 05/26/2022 1707   AST 107 (H) 12/14/2015 1412   ALT 20 05/26/2022 1707   ALT 27 12/14/2015 1412  ALKPHOS 103 05/26/2022 1707   ALKPHOS 178 (H) 12/14/2015 1412   BILITOT 0.8 05/26/2022 1707   BILITOT <0.30 12/14/2015 1412   GFRNONAA >60 05/26/2022 1707   GFRAA >60 03/29/2019 1111   Lipid Panel  No results found for: "CHOL", "TRIG", "HDL", "CHOLHDL", "VLDL", "LDLCALC", "LDLDIRECT" No results found for: "HGBA1C"  Imaging I have reviewed the images obtained:  MRI examination of the brain 05/05/22: 1. No acute intracranial pathology or epileptogenic focus identified. 2. Mild right occipital scalp swelling, similar to the prior CT. 3. Scattered small foci of FLAIR signal abnormality in the supratentorial white matter are nonspecific but may reflect sequela of chronic small-vessel ischemic change, overall mild but accelerated for age. 4. Layering fluid in the right maxillary sinus which may reflect acute sinusitis in the correct clinical setting.  Assessment: 47 year old female with PMHx of seizures (medication nonadherence versus alcohol use versus in the setting of acute illness) who presents to the ED on 06/01/2022 with right arm rhythmic jerking every 2-3 minutes lasting approximately 30 seconds each concerning for focal status epilepticus. Patient retains consciousness during right arm twitching.  Patient endorses compliance with her AED medications though she is unable to identify what she takes for seizures when asked.  Denies alcohol use since admission for seizures in December 2023.  Presentation is concerning for focal status epilepticus.  Will place patient on overnight monitoring for further  evaluation.   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 Note/plan to be edited by MD as needed.  SAnibal Henderson AGAC-NP [facility] Pager: [phone]  NEUROHOSPITALIST ADDENDUM Performed a face to face diagnostic evaluation.   I have reviewed the contents of history and physical exam as documented by PA/ARNP/Resident and agree with above documentation.  I have discussed and formulated the above plan as documented. Edits to the note have been made as needed.  Impression/Key exam findings/Plan: no obvious clinical seizure activity, no obvious seizures on cEEG. Will increase Keppra '1500mg'$  BID and continue Vimpat '100mg'$  BID. Keep her on LTM overnight given her hx of focal status in dec 2023. She is high risk for clustering of seizures and thus needs admission.  SDonnetta Simpers MD [facility] [phone]  If 7pm to 7am, please call on call as listed on AMION.

## 2022-06-01 NOTE — Progress Notes (Signed)
LTM EEG hooked up and running - no initial skin breakdown - push button tested - No [facility] at this time. Patient is in the ED.

## 2022-06-01 NOTE — ED Notes (Signed)
Pt presents with less frequent right arm spasm.

## 2022-06-01 NOTE — ED Notes (Signed)
EEG in place, pt sleeping at this time

## 2022-06-01 NOTE — ED Provider Notes (Signed)
[facility] Provider Note   CSN: [id number] Arrival date & time: 06/01/22  1557     History  Chief Complaint  Patient presents with   muscle tremors    Crystal Spence is a 47 y.o. female.  HPI Patient presents feeling bad.  Really cannot provide too much history.  History of seizures.  Presents with right arm shaking.  Bring his arm into her mouth.  Appears if she cannot control it.  States she came in she was feeling bad but cannot really tell me how.  History of seizure disorder and states she has been taking her medications.   Past Medical History:  Diagnosis Date   Anemia, unspecified 06/17/2013   Headache    History of laryngeal cancer 02/03/2013   Hypertension    Hypothyroidism (acquired) 06/17/2013   Multiple lung nodules 06/28/2014   Pneumonia 01/14/2014, 04/2017   Poor oral hygiene 06/17/2013   Rib pain 01/14/2014   Rib pain on left side 01/05/2014   Throat cancer ([place])     Home Medications Prior to Admission medications   Medication Sig Start Date End Date Taking? Authorizing Provider  acetaminophen (TYLENOL) 500 MG tablet Take 500-1,000 mg by mouth every 6 (six) hours as needed for mild pain or headache.    [provider]  diltiazem (CARDIZEM) 30 MG tablet Place 1 tablet (30 mg total) into feeding tube 2 (two) times daily. 05/17/22 11/08/23  Bonnielee Haff, MD  folic acid (FOLVITE) 1 MG tablet Place 1 tablet (1 mg total) into feeding tube daily. 05/23/22   Little Ishikawa, MD  lacosamide 100 MG TABS Place 1 tablet (100 mg total) into feeding tube 2 (two) times daily. 05/17/22   Bonnielee Haff, MD  levETIRAcetam (KEPPRA) 1000 MG tablet Place 1 tablet (1,000 mg total) into feeding tube 2 (two) times daily. 05/17/22   Bonnielee Haff, MD  levothyroxine (SYNTHROID) 75 MCG tablet Place 1 tablet (75 mcg total) into feeding tube daily before breakfast. 05/18/22   Bonnielee Haff, MD  Multiple Vitamin (MULTIVITAMIN WITH  MINERALS) TABS tablet Place 1 tablet into feeding tube daily. 05/23/22   Little Ishikawa, MD  Nutritional Supplements (FEEDING SUPPLEMENT, OSMOLITE 1.2 CAL,) LIQD Place 355 mLs into feeding tube 4 (four) times daily - after meals and at bedtime. 05/17/22   Bonnielee Haff, MD  pantoprazole sodium (PROTONIX) 40 mg Place 40 mg into feeding tube 2 (two) times daily. 05/23/22   Little Ishikawa, MD  polyethylene glycol (MIRALAX / GLYCOLAX) 17 g packet Place 17 g into feeding tube daily as needed. Patient taking differently: Place 17 g into feeding tube daily as needed for mild constipation or moderate constipation. 05/17/22   Bonnielee Haff, MD  senna-docusate (SENOKOT-S) 8.6-50 MG tablet Place 2 tablets into feeding tube at bedtime. 05/17/22   Bonnielee Haff, MD  thiamine (VITAMIN B-1) 100 MG tablet Place 1 tablet (100 mg total) into feeding tube daily. 05/23/22   Little Ishikawa, MD  traZODone (DESYREL) 100 MG tablet Take 100 mg by mouth at bedtime as needed for sleep.    [provider]  Water For Irrigation, Sterile (FREE WATER) SOLN Place 100 mLs into feeding tube every 6 (six) hours. 05/17/22   Bonnielee Haff, MD      Allergies    Patient has no known allergies.    Review of Systems   Review of Systems  Physical Exam Updated Vital Signs BP (!) 160/99   Pulse 100  Temp 98 F (36.7 C) (Oral)   Resp 19   Ht '5\' 1"'$  (1.549 m)   Wt 44 kg   LMP 10/20/2012 Comment: pt. has had tubal ligation,no longer has peroids  SpO2 98%   BMI 18.33 kg/m  Physical Exam Vitals and nursing note reviewed.  Eyes:     Extraocular Movements: Extraocular movements intact.     Pupils: Pupils are equal, round, and reactive to light.  Cardiovascular:     Rate and Rhythm: Regular rhythm.  Pulmonary:     Breath sounds: No wheezing or rhonchi.  Abdominal:     Comments: PEG tube in place.  Musculoskeletal:        General: No tenderness.     Cervical back: Neck supple.  Neurological:      Mental Status: She is alert.     Comments: Awake and answers questions.  However repetitive movements of right arm.  Held flexed at the elbow and somewhat flexed at the wrist.  Put hand up into her mouth and was unable to control it.  She is using her left arm to move her right arm away from mouth and pulled her right arm up over her head.  Repetitive shaking of the arm during this also.  Awake and face symmetric.  No involvement of lower extremities.  Able to move left arm freely.     ED Results / Procedures / Treatments   Labs (all labs ordered are listed, but only abnormal results are displayed) Labs Reviewed  CBC WITH DIFFERENTIAL/PLATELET - Abnormal; Notable for the following components:      Result Value   RDW 17.4 (*)    Platelets 498 (*)    Neutro Abs 8.4 (*)    Lymphs Abs 0.5 (*)    All other components within normal limits  COMPREHENSIVE METABOLIC PANEL - Abnormal; Notable for the following components:   Total Protein 6.2 (*)    Albumin 2.9 (*)    AST 46 (*)    All other components within normal limits  ETHANOL  LEVETIRACETAM LEVEL  LACOSAMIDE    EKG EKG Interpretation  Date/Time:  Saturday June 01 2022 16:27:11 EST Ventricular Rate:  101 PR Interval:  192 QRS Duration: 81 QT Interval:  343 QTC Calculation: 445 R Axis:   69 Text Interpretation: Sinus tachycardia Ventricular premature complex Aberrant conduction of SV complex(es) Right atrial enlargement Lateral infarct, old Confirmed by Davonna Belling [phone]) on 06/01/2022 4:37:07 PM  Radiology No results found.  Procedures Procedures    Medications Ordered in ED Medications  lacosamide (VIMPAT) 200 mg in sodium chloride 0.9 % 25 mL IVPB (0 mg Intravenous Stopped 06/01/22 1928)    Followed by  lacosamide (VIMPAT) 100 mg in sodium chloride 0.9 % 25 mL IVPB (has no administration in time range)  levETIRAcetam (KEPPRA) 2,500 mg in sodium chloride 0.9 % 250 mL IVPB (0 mg Intravenous Stopped 06/01/22 1859)     Followed by  levETIRAcetam (KEPPRA) IVPB 1500 mg/ 100 mL premix (has no administration in time range)  LORazepam (ATIVAN) injection 1 mg (1 mg Intravenous Given 06/01/22 1641)  LORazepam (ATIVAN) injection 1 mg (1 mg Intravenous Given 06/01/22 1735)    ED Course/ Medical Decision Making/ A&P                             Medical Decision Making Amount and/or Complexity of Data Reviewed Labs: ordered.  Risk Prescription drug management.   Patient  with reported feeling bad.  Repetitive movements right upper extremity.  Differential diagnosis includes partial seizures.  Has had previous seizures.  I have reviewed notes from recent admission for seizures and also neurology notes.  Given 1 mg Ativan emergently and discussed with Dr. Quinn Axe from neurology will come and see patient.  Has had improvement of the shaking but does have some mild recurrences.  Will get basic blood work.  Previous alcohol use thought to be contributing to seizures but states that she has not had a drink since she was in the hospital recently.  It appears she was discharged 10 days ago.  Given second milligram of Ativan.  Has been seen by neurology.  Given Keppra load.  Will get EEG monitoring.  Will require admission to the hospital.  Lab work reassuring.    CRITICAL CARE Performed by: Davonna Belling Total critical care time: 30 send is being sent to the pharmacy minutes Critical care time was exclusive of separately billable procedures and treating other patients. Critical care was necessary to treat or prevent imminent or life-threatening deterioration. Critical care was time spent personally by me on the following activities: development of treatment plan with patient and/or surrogate as well as nursing, discussions with consultants, evaluation of patient's response to treatment, examination of patient, obtaining history from patient or surrogate, ordering and performing treatments and interventions, ordering and  review of laboratory studies, ordering and review of radiographic studies, pulse oximetry and re-evaluation of patient's condition.         Final Clinical Impression(s) / ED Diagnoses Final diagnoses:  Partial seizures [facility])    Rx / Door Orders ED Discharge Orders     None         Davonna Belling, MD 06/01/22 1946

## 2022-06-01 NOTE — ED Notes (Addendum)
This RN assumed care of patient. Pt BIB  EMS from home. Pt arrived to ED reporting right arm and face tremors that have not improved. During tremor right arm places pressure over face, pt uses left hand to hold right arm from hitting face. Pt denies SOB and CP.  Pt presents with spontaneous breathing, equal bilaterally, and speaks full sentences without difficulty. Pt skin tone is appropriate for ethnicity, dry and warm. Pt connected to CCM, pulse ox and BP. ABCs intact and NAD noted at this time.

## 2022-06-01 NOTE — ED Notes (Signed)
Provider at bedside.

## 2022-06-01 NOTE — H&P (Signed)
History and Physical    Crystal Spence DOB: [birth year] DOA: 06/01/2022  PCP: Trey Sailors, PA  Patient coming from: Home  Chief Complaint: Right arm jerking  HPI: Crystal Spence is a 47 y.o. female with medical history significant of seizure disorder on Vimpat and Keppra, laryngeal cancer status post radiation with resultant oropharyngeal dysphagia and related malnutrition requiring G-tube, gastric ulcers, hypertension, hypothyroidism.  Recently admitted 1/7-1/11 for hypovolemic/hemorrhagic shock and acute blood loss anemia secondary to gastric versus duodenal ulcer.  She presents to the ED today for evaluation of right arm rhythmic jerking.  She retains consciousness during these episodes. Neurology felt that her presentation was concerning for focal status epilepticus.  She was placed on overnight EEG monitoring.  In the ED, patient remained hypertensive with systolic in the 956O to 130Q and slightly tachycardic.  Afebrile.  Labs showing no leukocytosis, hemoglobin 12.9 (stable), platelet count 498k, blood ethanol level undetectable. Patient received Ativan.  Neurology ordered IV Vimpat 200 mg followed by 100 mg every 12 hours and IV Keppra 2500 mg followed by 1500 mg every 12 hours.  TRH called to admit.  Patient received 2 doses of Ativan in the ED and currently very somnolent.  Not able to give any history.  Review of Systems:  Review of Systems  All other systems reviewed and are negative.   Past Medical History:  Diagnosis Date   Anemia, unspecified 06/17/2013   Headache    History of laryngeal cancer 02/03/2013   Hypertension    Hypothyroidism (acquired) 06/17/2013   Multiple lung nodules 06/28/2014   Pneumonia 01/14/2014, 04/2017   Poor oral hygiene 06/17/2013   Rib pain 01/14/2014   Rib pain on left side 01/05/2014   Throat cancer [facility])     Past Surgical History:  Procedure Laterality Date   ABDOMINAL SURGERY     BIOPSY  01/18/2018   Procedure: BIOPSY;   Surgeon: Wonda Horner, MD;  Location: [facility] ENDOSCOPY;  Service: Endoscopy;;   BIOPSY  05/21/2022   Procedure: BIOPSY;  Surgeon: Ronnette Juniper, MD;  Location: [place];  Service: Gastroenterology;;   COLONOSCOPY WITH PROPOFOL N/A 10/12/2015   Procedure: COLONOSCOPY WITH PROPOFOL;  Surgeon: Wonda Horner, MD;  Location: [facility] ENDOSCOPY;  Service: Endoscopy;  Laterality: N/A;   ESOPHAGOGASTRODUODENOSCOPY N/A 02/03/2013   Procedure: ESOPHAGOGASTRODUODENOSCOPY (EGD);  Surgeon: Juanita Craver, MD;  Location: [facility] ENDOSCOPY;  Service: Endoscopy;  Laterality: N/A;   ESOPHAGOGASTRODUODENOSCOPY N/A 10/12/2015   Procedure: ESOPHAGOGASTRODUODENOSCOPY (EGD);  Surgeon: Wonda Horner, MD;  Location: [facility] ENDOSCOPY;  Service: Endoscopy;  Laterality: N/A;   ESOPHAGOGASTRODUODENOSCOPY (EGD) WITH PROPOFOL N/A 01/18/2018   Procedure: ESOPHAGOGASTRODUODENOSCOPY (EGD) WITH PROPOFOL;  Surgeon: Wonda Horner, MD;  Location: [facility] ENDOSCOPY;  Service: Endoscopy;  Laterality: N/A;   ESOPHAGOGASTRODUODENOSCOPY (EGD) WITH PROPOFOL N/A 05/21/2022   Procedure: ESOPHAGOGASTRODUODENOSCOPY (EGD) WITH PROPOFOL;  Surgeon: Ronnette Juniper, MD;  Location: [place];  Service: Gastroenterology;  Laterality: N/A;   IR GASTROSTOMY TUBE MOD SED  05/15/2022   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 She has a 15.00 pack-year smoking history. She has never used smokeless tobacco. She reports current alcohol use of about 2.0 standard drinks of alcohol per week. She reports  that she does not use drugs.  No Known Allergies  Family History  Problem Relation Age of Onset   Cancer Maternal Grandmother        skin cancer    Prior to Admission medications   Medication Sig Start Date End Date Taking?  Authorizing Provider  acetaminophen (TYLENOL) 500 MG tablet Take 500-1,000 mg by mouth every 6 (six) hours as needed for mild pain or headache.    [provider]  diltiazem (CARDIZEM) 30 MG tablet Place 1 tablet (30 mg total) into feeding tube 2 (two) times daily. 05/17/22 11/08/23  Bonnielee Haff, MD  folic acid (FOLVITE) 1 MG tablet Place 1 tablet (1 mg total) into feeding tube daily. 05/23/22   Little Ishikawa, MD  lacosamide 100 MG TABS Place 1 tablet (100 mg total) into feeding tube 2 (two) times daily. 05/17/22   Bonnielee Haff, MD  levETIRAcetam (KEPPRA) 1000 MG tablet Place 1 tablet (1,000 mg total) into feeding tube 2 (two) times daily. 05/17/22   Bonnielee Haff, MD  levothyroxine (SYNTHROID) 75 MCG tablet Place 1 tablet (75 mcg total) into feeding tube daily before breakfast. 05/18/22   Bonnielee Haff, MD  Multiple Vitamin (MULTIVITAMIN WITH MINERALS) TABS tablet Place 1 tablet into feeding tube daily. 05/23/22   Little Ishikawa, MD  Nutritional Supplements (FEEDING SUPPLEMENT, OSMOLITE 1.2 CAL,) LIQD Place 355 mLs into feeding tube 4 (four) times daily - after meals and at bedtime. 05/17/22   Bonnielee Haff, MD  pantoprazole sodium (PROTONIX) 40 mg Place 40 mg into feeding tube 2 (two) times daily. 05/23/22   Little Ishikawa, MD  polyethylene glycol (MIRALAX / GLYCOLAX) 17 g packet Place 17 g into feeding tube daily as needed. Patient taking differently: Place 17 g into feeding tube daily as needed for mild constipation or moderate constipation. 05/17/22   Bonnielee Haff, MD  senna-docusate (SENOKOT-S) 8.6-50 MG tablet Place 2 tablets into feeding tube at bedtime. 05/17/22   Bonnielee Haff, MD  thiamine (VITAMIN B-1) 100 MG tablet Place 1 tablet (100 mg total) into feeding tube daily. 05/23/22   Little Ishikawa, MD  traZODone (DESYREL) 100 MG tablet Take 100 mg by mouth at bedtime as needed for sleep.    [provider]  Water For Irrigation, Sterile (FREE WATER)  SOLN Place 100 mLs into feeding tube every 6 (six) hours. 05/17/22   Bonnielee Haff, MD    Physical Exam: Vitals:   06/01/22 1800 06/01/22 1830 06/01/22 1900 06/01/22 1915  BP: (!) 166/108 (!) 158/109 (!) 164/99 (!) 160/99  Pulse: (!) 101 (!) 103 94 100  Resp: (!) '27 16 18 19  '$ Temp:      TempSrc:      SpO2: 96% 99% 100% 98%  Weight:      Height:        Physical Exam Vitals reviewed.  Constitutional:      General: She is not in acute distress. HENT:     Head: Normocephalic and atraumatic.  Eyes:     Extraocular Movements: Extraocular movements intact.  Cardiovascular:     Rate and Rhythm: Normal rate and regular rhythm.     Pulses: Normal pulses.  Pulmonary:     Effort: Pulmonary effort is normal. No respiratory distress.     Breath sounds: Normal breath sounds.  Abdominal:     General: Bowel sounds are normal. There is no distension.     Palpations: Abdomen is soft.     Tenderness: There is no abdominal  tenderness.  Musculoskeletal:     Cervical back: Normal range of motion.     Right lower leg: No edema.     Left lower leg: No edema.  Skin:    General: Skin is warm and dry.  Neurological:     Mental Status: She is alert.     Comments: Very somnolent but arousable Moving all extremities on command, no focal weakness     Labs on Admission: I have personally reviewed following labs and imaging studies  CBC: Recent Labs  Lab 05/26/22 1707 06/01/22 1631  WBC 4.4 9.6  NEUTROABS  --  8.4*  HGB 12.4 12.9  HCT 37.6 40.9  MCV 91.9 93.8  PLT 384 790*   Basic Metabolic Panel: Recent Labs  Lab 05/26/22 1707 06/01/22 1631  NA 133* 136  K 2.8* 4.2  CL 97* 103  CO2 25 25  GLUCOSE 71 81  BUN <5* 11  CREATININE 0.71 0.93  CALCIUM 8.6* 9.1   GFR: Estimated Creatinine Clearance: 51.4 mL/min (by C-G formula based on SCr of 0.93 mg/dL). Liver Function Tests: Recent Labs  Lab 05/26/22 1707 06/01/22 1631  AST 35 46*  ALT 20 21  ALKPHOS 103 89  BILITOT 0.8  0.4  PROT 6.2* 6.2*  ALBUMIN 3.0* 2.9*   No results for input(s): "LIPASE", "AMYLASE" in the last 168 hours. No results for input(s): "AMMONIA" in the last 168 hours. Coagulation Profile: No results for input(s): "INR", "PROTIME" in the last 168 hours. Cardiac Enzymes: No results for input(s): "CKTOTAL", "CKMB", "CKMBINDEX", "TROPONINI" in the last 168 hours. BNP (last 3 results) No results for input(s): "PROBNP" in the last 8760 hours. HbA1C: No results for input(s): "HGBA1C" in the last 72 hours. CBG: Recent Labs  Lab 05/26/22 1628  GLUCAP 84   Lipid Profile: No results for input(s): "CHOL", "HDL", "LDLCALC", "TRIG", "CHOLHDL", "LDLDIRECT" in the last 72 hours. Thyroid Function Tests: No results for input(s): "TSH", "T4TOTAL", "FREET4", "T3FREE", "THYROIDAB" in the last 72 hours. Anemia Panel: No results for input(s): "VITAMINB12", "FOLATE", "FERRITIN", "TIBC", "IRON", "RETICCTPCT" in the last 72 hours. Urine analysis:    Component Value Date/Time   COLORURINE STRAW (A) 05/19/2022 2334   APPEARANCEUR CLEAR 05/19/2022 2334   LABSPEC 1.023 05/19/2022 2334   PHURINE 6.0 05/19/2022 2334   GLUCOSEU NEGATIVE 05/19/2022 2334   HGBUR NEGATIVE 05/19/2022 2334   BILIRUBINUR NEGATIVE 05/19/2022 2334   KETONESUR NEGATIVE 05/19/2022 2334   PROTEINUR NEGATIVE 05/19/2022 2334   UROBILINOGEN 0.2 12/06/2013 1718   NITRITE NEGATIVE 05/19/2022 2334   LEUKOCYTESUR NEGATIVE 05/19/2022 2334    Radiological Exams on Admission: No results found.  EKG: Independently reviewed.  Sinus tachycardia, baseline wander in lateral leads.  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  Neurology saw the patient felt that her presentation was concerning for focal status epilepticus and she was placed on overnight EEG monitoring.  No CT head done. ?Alcohol  withdrawal but patient told ED provider that she has not consumed alcohol since after her recent hospitalization. -Neurology managing -LTM EEG -Serum Vimpat and Keppra levels -Neurology ordered IV Vimpat 200 mg loading dose followed by 100 mg every 12 hours and IV Keppra 2500 mg loading dose followed by 1500 mg every 12 hours. -IV Ativan PRN seizures lasting greater than 3 minutes and notify neurology -Seizure precautions  History of laryngeal cancer with resultant oropharyngeal dysphagia Severe protein calorie malnutrition  status post G-tube -SLP eval, dietitian consulted  History of gastric and duodenal ulcers Recently admitted for upper GI bleed and underwent EGD.  Hemoglobin currently stable. -Continue Protonix after pharmacy med rec is done. -Monitor H&H  Hypothyroidism -Continue Synthroid after pharmacy med rec is done.  Hypertension Blood pressure elevated, systolic currently in the 160s to 170s. -Pharmacy med rec pending -IV labetalol PRN  Mild thrombocytosis -Repeat CBC in a.m. to verify  History of alcohol abuse Patient is hypertensive and slightly tachycardic.  Blood ethanol level undetectable.  She is currently somnolent after Ativan and not able to give history but told ED provider that she has not consumed alcohol since after her recent hospitalization (discharged 9 days ago). ?Alcohol withdrawal contributing to breakthrough seizures. -CIWA protocol; Ativan as needed -Thiamine, folate, multivitamin  DVT prophylaxis: SCDs Code Status: Full Code Family Communication: No family available at this time. Consults called: Neurology Level of care: Progressive care Admission status: It is my clinical opinion that referral for OBSERVATION is reasonable and necessary in this patient based on the above information provided. The aforementioned taken together are felt to place the patient at high risk for further clinical deterioration. However, it is anticipated that the  patient may be medically stable for discharge from the hospital within 24 to 48 hours.   Shela Leff MD [facility]  If 7PM-7AM, please contact night-coverage [email or web address]  06/01/2022, 7:56 PM

## 2022-06-01 NOTE — Progress Notes (Addendum)
MB called Nurse, No answer Duanne Guess), en route and arrived to room, setting up for LTM EEG and nurse informed me that patient was going to MRI and they had called for Pt with in the next half hour at 6:25 pm. Completed equipment set-up and informed 3rd shift tech of status.

## 2022-06-02 ENCOUNTER — Inpatient Hospital Stay (HOSPITAL_COMMUNITY): Payer: Medicaid Other

## 2022-06-02 DIAGNOSIS — Z8711 Personal history of peptic ulcer disease: Secondary | ICD-10-CM | POA: Diagnosis not present

## 2022-06-02 DIAGNOSIS — E039 Hypothyroidism, unspecified: Secondary | ICD-10-CM | POA: Diagnosis present

## 2022-06-02 DIAGNOSIS — R569 Unspecified convulsions: Secondary | ICD-10-CM | POA: Diagnosis not present

## 2022-06-02 DIAGNOSIS — Z79899 Other long term (current) drug therapy: Secondary | ICD-10-CM | POA: Diagnosis not present

## 2022-06-02 DIAGNOSIS — Z681 Body mass index (BMI) 19 or less, adult: Secondary | ICD-10-CM | POA: Diagnosis not present

## 2022-06-02 DIAGNOSIS — D75839 Thrombocytosis, unspecified: Secondary | ICD-10-CM | POA: Diagnosis present

## 2022-06-02 DIAGNOSIS — Z923 Personal history of irradiation: Secondary | ICD-10-CM | POA: Diagnosis not present

## 2022-06-02 DIAGNOSIS — Z8521 Personal history of malignant neoplasm of larynx: Secondary | ICD-10-CM | POA: Diagnosis not present

## 2022-06-02 DIAGNOSIS — G40101 Localization-related (focal) (partial) symptomatic epilepsy and epileptic syndromes with simple partial seizures, not intractable, with status epilepticus: Secondary | ICD-10-CM | POA: Diagnosis present

## 2022-06-02 DIAGNOSIS — E43 Unspecified severe protein-calorie malnutrition: Secondary | ICD-10-CM | POA: Diagnosis present

## 2022-06-02 DIAGNOSIS — Z79890 Hormone replacement therapy (postmenopausal): Secondary | ICD-10-CM | POA: Diagnosis not present

## 2022-06-02 DIAGNOSIS — R1312 Dysphagia, oropharyngeal phase: Secondary | ICD-10-CM | POA: Diagnosis present

## 2022-06-02 DIAGNOSIS — I10 Essential (primary) hypertension: Secondary | ICD-10-CM | POA: Diagnosis present

## 2022-06-02 DIAGNOSIS — Z87891 Personal history of nicotine dependence: Secondary | ICD-10-CM | POA: Diagnosis not present

## 2022-06-02 DIAGNOSIS — R918 Other nonspecific abnormal finding of lung field: Secondary | ICD-10-CM | POA: Diagnosis present

## 2022-06-02 DIAGNOSIS — Z79891 Long term (current) use of opiate analgesic: Secondary | ICD-10-CM | POA: Diagnosis not present

## 2022-06-02 DIAGNOSIS — Z931 Gastrostomy status: Secondary | ICD-10-CM | POA: Diagnosis not present

## 2022-06-02 DIAGNOSIS — G40901 Epilepsy, unspecified, not intractable, with status epilepticus: Secondary | ICD-10-CM | POA: Diagnosis present

## 2022-06-02 MED ORDER — THIAMINE MONONITRATE 100 MG PO TABS
100.0000 mg | ORAL_TABLET | Freq: Every day | ORAL | Status: DC
  Administered 2022-06-02 – 2022-06-03 (×2): 100 mg
  Filled 2022-06-02 (×2): qty 1

## 2022-06-02 MED ORDER — FREE WATER
100.0000 mL | Freq: Four times a day (QID) | Status: DC
  Administered 2022-06-02 (×2): 100 mL

## 2022-06-02 MED ORDER — ADULT MULTIVITAMIN W/MINERALS CH
1.0000 | ORAL_TABLET | Freq: Every day | ORAL | Status: DC
  Administered 2022-06-02 – 2022-06-03 (×2): 1
  Filled 2022-06-02 (×2): qty 1

## 2022-06-02 MED ORDER — DIAZEPAM 5 MG/ML IJ SOLN: 5.0000 mg | INTRAMUSCULAR | Status: DC | PRN

## 2022-06-02 MED ORDER — HYDROXYZINE HCL 25 MG PO TABS
25.0000 mg | ORAL_TABLET | ORAL | Status: DC | PRN
  Administered 2022-06-02: 25 mg via ORAL
  Filled 2022-06-02: qty 1

## 2022-06-02 MED ORDER — DIAZEPAM 5 MG/ML IJ SOLN: 2.5000 mg | INTRAMUSCULAR | Status: DC | PRN

## 2022-06-02 MED ORDER — SIMETHICONE 40 MG/0.6ML PO SUSP
80.0000 mg | Freq: Four times a day (QID) | ORAL | Status: DC
  Administered 2022-06-02 – 2022-06-03 (×2): 80 mg
  Filled 2022-06-02 (×5): qty 1.2

## 2022-06-02 MED ORDER — ACETAMINOPHEN 500 MG PO TABS: 500.0000 mg | ORAL_TABLET | Freq: Four times a day (QID) | ORAL | Status: DC | PRN

## 2022-06-02 MED ORDER — SENNOSIDES-DOCUSATE SODIUM 8.6-50 MG PO TABS
2.0000 | ORAL_TABLET | Freq: Every day | ORAL | Status: DC
  Administered 2022-06-02: 2
  Filled 2022-06-02: qty 2

## 2022-06-02 MED ORDER — LEVOTHYROXINE SODIUM 75 MCG PO TABS
75.0000 ug | ORAL_TABLET | Freq: Every day | ORAL | Status: DC
  Administered 2022-06-02 – 2022-06-03 (×2): 75 ug
  Filled 2022-06-02 (×2): qty 1

## 2022-06-02 MED ORDER — DILTIAZEM HCL 30 MG PO TABS
30.0000 mg | ORAL_TABLET | Freq: Two times a day (BID) | ORAL | Status: DC
  Administered 2022-06-02 – 2022-06-03 (×3): 30 mg
  Filled 2022-06-02 (×4): qty 1

## 2022-06-02 MED ORDER — FOLIC ACID 1 MG PO TABS
1.0000 mg | ORAL_TABLET | Freq: Every day | ORAL | Status: DC
  Administered 2022-06-02 – 2022-06-03 (×2): 1 mg
  Filled 2022-06-02 (×2): qty 1

## 2022-06-02 MED ORDER — FAMOTIDINE 20 MG PO TABS
20.0000 mg | ORAL_TABLET | Freq: Two times a day (BID) | ORAL | Status: DC
  Administered 2022-06-02 – 2022-06-03 (×3): 20 mg
  Filled 2022-06-02 (×3): qty 1

## 2022-06-02 MED ORDER — HYDROXYZINE HCL 25 MG PO TABS: 25.0000 mg | ORAL_TABLET | ORAL | Status: DC | PRN

## 2022-06-02 MED ORDER — DEXTROSE-NACL 5-0.9 % IV SOLN: INTRAVENOUS | Status: DC

## 2022-06-02 MED ORDER — ACETAMINOPHEN 500 MG PO TABS
500.0000 mg | ORAL_TABLET | Freq: Four times a day (QID) | ORAL | Status: DC | PRN
  Administered 2022-06-02: 500 mg
  Filled 2022-06-02: qty 1

## 2022-06-02 MED ORDER — OSMOLITE 1.2 CAL PO LIQD
355.0000 mL | Freq: Three times a day (TID) | ORAL | Status: DC
  Administered 2022-06-02 (×2): 355 mL
  Filled 2022-06-02 (×2): qty 474

## 2022-06-02 NOTE — Progress Notes (Signed)
Neurology Progress Note  Brief HPI: 47 year old female with PMHx of seizures on home Vimpat and Keppra who presented to the ED 06/01/2022 for evaluation of right arm tremor.  On evaluation patient had 15-30 seconds of right arm jerking, at times rhythmic, that occurred every 2 to 3 minutes concerning for focal status.  Patient retained consciousness throughout jerking episodes.    Subjective: Patient sitting up, eating breakfast in bed.  States that she feels "much better".  Patient states that she is at her baseline status.  Denies further jerking episodes since evaluation yesterday evening after her second dose of 1 mg IV Ativan. EEG overnight with evidence of epileptogenicity arising from the left frontoparietal region on the ictal-interictal continuum and high risk for seizures.  Exam: Vitals:   06/02/22 0900 06/02/22 0922  BP: (!) 166/121   Pulse: 98   Resp: (!) 22   Temp:  97.8 F (36.6 C)  SpO2: 98%    Gen: Sitting up in bed, LTM EEG monitoring in place, in no acute distress, eating breakfast Resp: non-labored breathing, no acute distress on room air Abd: soft, nt, PEG tube in place left of midline abdomen  Neuro: Mental Status: Awake, alert, oriented to self, situation, and place.  She correctly states that she is 47 years old and that the month is January.  She is unable to straighten the correct year. She follows commands with repeat instruction. Speech is more clear and pronounced today. No dysarthria or aphasia noted. Cranial Nerves: PERRL, EMI, VFF, face is symmetric resting and with movement, facial sensation is intact to light touch bilaterally, hearing is intact to voice, tongue is midline. Motor: Moves all extremities antigravity and spontaneously.  No unilateral weakness noted. There is some right upper extremity tremor with intention but no further rhythmic jerking. Sensory: Intact and symmetric to light touch throughout DTR: 4+ patellae Gait: Deferred  Pertinent  Labs: CBC    Component Value Date/Time   WBC 9.6 06/01/2022 1631   RBC 4.36 06/01/2022 1631   HGB 12.9 06/01/2022 1631   HGB 10.3 (L) 12/14/2015 1412   HCT 40.9 06/01/2022 1631   HCT 34.1 (L) 12/14/2015 1412   PLT 498 (H) 06/01/2022 1631   PLT 154 12/14/2015 1412   MCV 93.8 06/01/2022 1631   MCV 90.0 12/14/2015 1412   MCH 29.6 06/01/2022 1631   MCHC 31.5 06/01/2022 1631   RDW 17.4 (H) 06/01/2022 1631   RDW 20.7 (H) 12/14/2015 1412   LYMPHSABS 0.5 (L) 06/01/2022 1631   LYMPHSABS 0.4 (L) 12/14/2015 1412   MONOABS 0.4 06/01/2022 1631   MONOABS 0.4 12/14/2015 1412   EOSABS 0.2 06/01/2022 1631   EOSABS 0.1 12/14/2015 1412   BASOSABS 0.1 06/01/2022 1631   BASOSABS 0.0 12/14/2015 1412   CMP     Component Value Date/Time   NA 136 06/01/2022 1631   NA 138 12/14/2015 1412   K 4.2 06/01/2022 1631   K 3.8 12/14/2015 1412   CL 103 06/01/2022 1631   CO2 25 06/01/2022 1631   CO2 20 (L) 12/14/2015 1412   GLUCOSE 81 06/01/2022 1631   GLUCOSE 77 12/14/2015 1412   BUN 11 06/01/2022 1631   BUN 4.6 (L) 12/14/2015 1412   CREATININE 0.93 06/01/2022 1631   CREATININE 0.7 12/14/2015 1412   CALCIUM 9.1 06/01/2022 1631   CALCIUM 9.4 12/14/2015 1412   PROT 6.2 (L) 06/01/2022 1631   PROT 7.3 12/14/2015 1412   ALBUMIN 2.9 (L) 06/01/2022 1631   ALBUMIN 3.8 12/14/2015 1412  AST 46 (H) 06/01/2022 1631   AST 107 (H) 12/14/2015 1412   ALT 21 06/01/2022 1631   ALT 27 12/14/2015 1412   ALKPHOS 89 06/01/2022 1631   ALKPHOS 178 (H) 12/14/2015 1412   BILITOT 0.4 06/01/2022 1631   BILITOT <0.30 12/14/2015 1412   GFRNONAA >60 06/01/2022 1631   GFRAA >60 03/29/2019 1111   Drugs of Abuse     Component Value Date/Time   LABOPIA NONE DETECTED 05/01/2022 1331   COCAINSCRNUR NONE DETECTED 05/01/2022 1331   LABBENZ NONE DETECTED 05/01/2022 1331   AMPHETMU NONE DETECTED 05/01/2022 1331   THCU POSITIVE (A) 05/01/2022 1331   LABBARB NONE DETECTED 05/01/2022 1331    Urinalysis    Component Value  Date/Time   COLORURINE STRAW (A) 05/19/2022 2334   APPEARANCEUR CLEAR 05/19/2022 2334   LABSPEC 1.023 05/19/2022 2334   PHURINE 6.0 05/19/2022 [place] 05/19/2022 2334   HGBUR NEGATIVE 05/19/2022 2334   BILIRUBINUR NEGATIVE 05/19/2022 2334   KETONESUR NEGATIVE 05/19/2022 2334   PROTEINUR NEGATIVE 05/19/2022 2334   UROBILINOGEN 0.2 12/06/2013 1718   NITRITE NEGATIVE 05/19/2022 2334   LEUKOCYTESUR NEGATIVE 05/19/2022 2334   Alcohol Level    Component Value Date/Time   ETH <10 06/01/2022 1631   Keppra and Vimpat levels pending  Imaging Reviewed: CT Head 05/26/22:  Age advanced volume loss, no acute intracranial abnormality  EEG overnight 06/01/22 - 06/02/22: "This study showed evidence of epileptogenicity arising from left fronto-parietal region which is on the ictal-interictal continuum and high risk for seizures. Additionally there is cortical dysfunction arising from left fronto-parietal region likely due to underlying structural abnormality, post-ictal state."  Assessment: 47 year old female with PMHx of seizures (medication nonadherence versus alcohol use versus in the setting of acute illness) who presents to the ED on 06/01/2022 with right arm rhythmic jerking every 2-3 minutes lasting approximately 30 seconds each concerning for focal status epilepticus. Patient retains consciousness during right arm twitching.  Patient endorses compliance with her AED medications though she is unable to identify what she takes for seizures when asked.  Denies alcohol use since admission for seizures in December 2023.  Presentation is concerning for focal status epilepticus.  Overnight EEG with evidence of epileptogenicity arising from the left frontoparietal region which is on the ictal-interictal continuum and high risk for seizures.  Also there is cortical dysfunction in the left frontoparietal region likely due to underlying structural abnormality, postictal state.  Will continue LTM  given her history of focal status in December 2023 as patient is felt high risk for clustering of seizures.  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 If no further seizures, discontinue in AM -Continue Keppra 1500 mg twice daily and Vimpat 100 mg twice daily -Continue seizure precautions -1 mg IV Ativan STAT q76mn as needed for seizures lasting > 3 minutes and notify neurology    SAnibal Henderson AGACNP-BC [facility] [phone]  Attending Neurohospitalist Addendum Patient seen and examined with APP/Resident. Agree with the history and physical as documented above. Agree with the plan as documented, which I helped formulate. I have edited the note above to reflect my full findings and recommendations. I have independently reviewed the chart, obtained history, review of systems and examined the patient.I have personally reviewed pertinent head/neck/spine imaging (CT/MRI). Please feel free to call with any questions.  -- CSu Monks MD [facility] [phone]  If 7pm- 7am, please page neurology on call as listed in [place].

## 2022-06-02 NOTE — Procedures (Addendum)
Patient Name: BENICIA BERGEVIN  MRN: [id number]  Epilepsy Attending: Lora Havens  Referring Physician/Provider: Derek Jack, MD   Duration: 06/01/2022 1834 to 06/02/2022 3005, 06/02/2021 2020 to 06/03/2021 0720   Patient history: 47 year old female being evaluated for nonconvulsive status epilepticus.   Level of alertness: Awake, asleep   AEDs during EEG study: LEV, LCM   Technical aspects: This EEG study was done with scalp electrodes positioned according to the 10-20 International system of electrode placement. Electrical activity was reviewed with band pass filter of 1-'70Hz'$ , sensitivity of 7 uV/mm, display speed of 61m/sec with a '60Hz'$  notched filter applied as appropriate. EEG data were recorded continuously and digitally stored.  Video monitoring was available and reviewed as appropriate.   Description: The posterior dominant rhythm consists of 9-10 Hz activity of moderate voltage (25-35 uV) seen predominantly in posterior head regions, symmetric and reactive to eye opening and eye closing. Sleep was characterized by vertex waves, sleep spindles (12 to 14 Hz), maximal frontocentral region. EEG showed continuous 3 to 5 Hz theta-delta slowing in left fronto-parietal region admixed with 13-'15hz'$  beta activity. Sharp waves were noted in left fronto-parietal region which at times appeared rhythmic and quasi-periodic at 0.5 to 2 Hz.  Hyperventilation and photic stimulation were not performed.     EEG was disconnected between  06/02/2022 0753 to 2020 for unclear reasons.   ABNORMALITY - Sharp waves, left fronto-parietal region - Continuous slow, left fronto-parietal region   IMPRESSION: This study showed evidence of epileptogenicity arising from left fronto-parietal region which is on the ictal-interictal continuum and high risk for seizures. Additionally there is cortical dysfunction arising from left fronto-parietal region likely due to underlying structural abnormality, post-ictal state.    Dr SQuinn Axewas notified   Everton Bertha OBarbra Sarks

## 2022-06-02 NOTE — ED Notes (Signed)
ED TO INPATIENT HANDOFF REPORT  ED Nurse Name and Phone #: Duanne Guess RN [phone]  S Name/Age/Gender Lorain Childes 47 y.o. female Room/Bed: 004C/004C  Code Status   Code Status: Full Code  Triage Complete: Triage complete  Chief Complaint Status epilepticus ([place]) [G40.901] Seizure ([place]) [R56.9]  Triage Note No notes on file   Allergies No Known Allergies  Level of Care/Admitting Diagnosis ED Disposition     ED Disposition  [place]: [place] [[id number]]  Level of Care: Telemetry Medical [104]  May admit patient to [facility] or [name] if equivalent level of care is available:: No  Covid Evaluation: Asymptomatic - no recent exposure (last 10 days) testing not required  Diagnosis: Seizure ([place]) [494496]  Admitting Physician: Cherene Altes [[id number]]  Attending Physician: Thereasa Solo, JEFFREY T [[id number]]  Certification:: I certify this patient will need inpatient services for at least 2 midnights          B Medical/Surgery History Past Medical History:  Diagnosis Date   Anemia, unspecified 06/17/2013   Headache    History of laryngeal cancer 02/03/2013   Hypertension    Hypothyroidism (acquired) 06/17/2013   Multiple lung nodules 06/28/2014   Pneumonia 01/14/2014, 04/2017   Poor oral hygiene 06/17/2013   Rib pain 01/14/2014   Rib pain on left side 01/05/2014   Throat cancer [facility])    Past Surgical History:  Procedure Laterality Date   ABDOMINAL SURGERY     BIOPSY  01/18/2018   Procedure: BIOPSY;  Surgeon: Wonda Horner, MD;  Location: [facility] ENDOSCOPY;  Service: Endoscopy;;   BIOPSY  05/21/2022   Procedure: BIOPSY;  Surgeon: Ronnette Juniper, MD;  Location: [place];  Service: Gastroenterology;;   COLONOSCOPY WITH PROPOFOL N/A 10/12/2015   Procedure: COLONOSCOPY WITH PROPOFOL;  Surgeon: Wonda Horner, MD;  Location: [facility] ENDOSCOPY;  Service: Endoscopy;  Laterality: N/A;   ESOPHAGOGASTRODUODENOSCOPY N/A 02/03/2013    Procedure: ESOPHAGOGASTRODUODENOSCOPY (EGD);  Surgeon: Juanita Craver, MD;  Location: [facility] ENDOSCOPY;  Service: Endoscopy;  Laterality: N/A;   ESOPHAGOGASTRODUODENOSCOPY N/A 10/12/2015   Procedure: ESOPHAGOGASTRODUODENOSCOPY (EGD);  Surgeon: Wonda Horner, MD;  Location: [facility];  Service: Endoscopy;  Laterality: N/A;   ESOPHAGOGASTRODUODENOSCOPY (EGD) WITH PROPOFOL N/A 01/18/2018   Procedure: ESOPHAGOGASTRODUODENOSCOPY (EGD) WITH PROPOFOL;  Surgeon: Wonda Horner, MD;  Location[facility] ENDOSCOPY;  Service: Endoscopy;  Laterality: N/A;   ESOPHAGOGASTRODUODENOSCOPY (EGD) WITH PROPOFOL N/A 05/21/2022   Procedure: ESOPHAGOGASTRODUODENOSCOPY (EGD) WITH PROPOFOL;  Surgeon: Ronnette Juniper, MD;  Location: [place];  Service: Gastroenterology;  Laterality: N/A;   IR GASTROSTOMY TUBE MOD SED  05/15/2022   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 LUQ 05/15/22  1541  LUQ  18            Intake/Output Last 24 hours  Intake/Output Summary (Last 24 hours) at 06/02/2022 1831 Last data  filed at 06/02/2022 1827 Gross per 24 hour  Intake 542.72 ml  Output 650 ml  Net -107.28 ml    Labs/Imaging Results for orders placed or performed during the hospital encounter of 06/01/22 (from the past 48 hour(s))  CBC with Differential     Status: Abnormal   Collection Time: 06/01/22  4:31 PM  Result Value Ref Range   WBC 9.6 4.0 - 10.5 K/uL   RBC 4.36 3.87 - 5.11  MIL/uL   Hemoglobin 12.9 12.0 - 15.0 g/dL   HCT 40.9 36.0 - 46.0 %   MCV 93.8 80.0 - 100.0 fL   MCH 29.6 26.0 - 34.0 pg   MCHC 31.5 30.0 - 36.0 g/dL   RDW 17.4 (H) 11.5 - 15.5 %   Platelets 498 (H) 150 - 400 K/uL   nRBC 0.0 0.0 - 0.2 %   Neutrophils Relative % 88 %   Neutro Abs 8.4 (H) 1.7 - 7.7 K/uL   Lymphocytes Relative 5 %   Lymphs Abs 0.5 (L) 0.7 - 4.0 K/uL   Monocytes Relative 4 %   Monocytes Absolute 0.4 0.1 - 1.0 K/uL   Eosinophils Relative 2 %   Eosinophils Absolute 0.2 0.0 - 0.5 K/uL   Basophils Relative 1 %   Basophils Absolute 0.1 0.0 - 0.1 K/uL   Immature Granulocytes 0 %   Abs Immature Granulocytes 0.02 0.00 - 0.07 K/uL    Comment: Performed at [facility], [address] [place], [place] [address]  Ethanol     Status: None   Collection Time: 06/01/22  4:31 PM  Result Value Ref Range   Alcohol, Ethyl (B) <10 <10 mg/dL    Comment: (NOTE) Lowest detectable limit for serum alcohol is 10 mg/dL.  For medical purposes only. Performed at [facility], [place] [address] [place], [place] [address]   Comprehensive metabolic panel     Status: Abnormal   Collection Time: 06/01/22  4:31 PM  Result Value Ref Range   Sodium 136 135 - 145 mmol/L   Potassium 4.2 3.5 - 5.1 mmol/L   Chloride 103 98 - 111 mmol/L   CO2 25 22 - 32 mmol/L   Glucose, Bld 81 70 - 99 mg/dL    Comment: Glucose reference range applies only to samples taken after fasting for at least 8 hours.   BUN 11 6 - 20 mg/dL   Creatinine, Ser 0.93 0.44 - 1.00 mg/dL   Calcium 9.1 8.9 - 10.3 mg/dL   Total Protein 6.2 (L) 6.5 - 8.1 g/dL   Albumin 2.9 (L) 3.5 - 5.0 g/dL   AST 46 (H) 15 - 41 U/L   ALT 21 0 - 44 U/L   Alkaline Phosphatase 89 38 - 126 U/L   Total Bilirubin 0.4 0.3 - 1.2 mg/dL   GFR, Estimated >60 >60 mL/min    Comment: (NOTE) Calculated using the CKD-EPI Creatinine Equation (2021)    Anion gap 8 5 - 15    Comment: Performed at [facility], [place] [address], [place],  [place] [address]   Overnight EEG with video  Result Date: 06/02/2022 Lora Havens, MD     06/02/2022  8:22 AM Patient Name: AGATHA DUPLECHAIN MRN: [id number] Epilepsy Attending: Lora Havens Referring Physician/Provider: Derek Jack, MD  Duration: 06/01/2022 1834 to 06/02/2022 0753  Patient history: 47 year old female being evaluated for nonconvulsive status epilepticus.  Level of alertness: Awake, asleep  AEDs during EEG study: LEV, LCM  Technical aspects: This EEG study was done with scalp electrodes positioned according to the 10-20 International system of electrode placement. Electrical activity was reviewed with band pass filter of 1-'70Hz'$ , sensitivity of 7 uV/mm, display speed of 78m/sec with a '60Hz'$  notched filter applied as appropriate. EEG data were recorded continuously and digitally stored.  Video monitoring was available and reviewed as appropriate.  Description: The posterior dominant rhythm consists of 9-10 Hz activity of moderate voltage (25-35 uV) seen predominantly in posterior head regions, symmetric and reactive to eye opening and eye closing. Sleep was characterized by vertex waves, sleep spindles (12 to 14 Hz), maximal frontocentral region. EEG showed continuous 3 to 5 Hz theta-delta slowing in left fronto-parietal region admixed with 13-'15hz'$  beta activity. Sharp waves were noted in left fronto-parietal region which at times appeared rhythmic and quasi-periodic at 0.5 to 2 Hz.  Hyperventilation and photic stimulation were not performed.    ABNORMALITY - Sharp waves, left fronto-parietal region - Continuous slow, left fronto-parietal region  IMPRESSION: This study showed evidence of epileptogenicity arising from left fronto-parietal region which is on the ictal-interictal continuum and high risk for seizures. Additionally there is cortical dysfunction arising from left fronto-parietal region likely due to underlying structural abnormality, post-ictal state. Dr SQuinn Axewas notified  PLora Havens   Pending Labs Unresulted Labs (From admission, onward)     Start     Ordered   06/03/22 0500  Comprehensive metabolic panel  Tomorrow morning,   R        06/02/22 1816   06/03/22 0500  Magnesium  Tomorrow morning,   R        06/02/22 1816   06/03/22 0500  CBC  Tomorrow morning,   R        06/02/22 1816   06/01/22 1900  Levetiracetam level  Once,   R        06/01/22 1900   06/01/22 1900  Lacosamide  Once,   R        06/01/22 1900            Vitals/Pain Today's Vitals   06/02/22 1300 06/02/22 1430 06/02/22 1630 06/02/22 1730  BP:  125/88 (!) 124/90 135/85  Pulse:  97 97 98  Resp:  (!) 23 (!) 25 18  Temp: 97.8 F (36.6 C)   98 F (36.7 C)  TempSrc:      SpO2:  98% 96% 97%  Weight:      Height:      PainSc:        Isolation Precautions No active isolations  Medications Medications  lacosamide (VIMPAT) 200 mg in sodium chloride 0.9 % 25 mL IVPB (0 mg Intravenous Stopped 06/01/22 1928)    Followed by  lacosamide (VIMPAT) 100 mg in sodium chloride 0.9 % 25 mL IVPB (0 mg Intravenous Stopped 06/02/22 1036)  levETIRAcetam (KEPPRA) 2,500 mg in sodium chloride 0.9 % 250 mL IVPB (0 mg Intravenous Stopped 06/01/22 1859)    Followed by  levETIRAcetam (KEPPRA) IVPB 1500 mg/ 100 mL premix (0 mg Intravenous Stopped 06/02/22 1827)  Oral care mouth rinse (has no administration in time range)  labetalol (NORMODYNE) injection 5 mg (has no administration in time range)  diazepam (VALIUM) injection 2.5 mg (has no administration in time range)  diltiazem (CARDIZEM) tablet 30 mg (30 mg Per Tube Given 06/02/22 0917)  levothyroxine (SYNTHROID) tablet 75 mcg (75 mcg Per Tube Given 06/02/22 0830)  famotidine (PEPCID) tablet 20 mg (20 mg Per Tube Given  06/02/22 0917)  senna-docusate (Senokot-S) tablet 2 tablet (has no administration in time range)  folic acid (FOLVITE) tablet 1 mg (1 mg Per Tube Given 06/02/22 0917)  multivitamin with minerals tablet 1 tablet (1 tablet Per Tube Given  06/02/22 0917)  thiamine (VITAMIN B1) tablet 100 mg (100 mg Per Tube Given 06/02/22 0917)  acetaminophen (TYLENOL) tablet 500 mg (has no administration in time range)  hydrOXYzine (ATARAX) tablet 25 mg (has no administration in time range)  dextrose 5 %-0.9 % sodium chloride infusion (has no administration in time range)  LORazepam (ATIVAN) injection 1 mg (1 mg Intravenous Given 06/01/22 1641)  LORazepam (ATIVAN) injection 1 mg (1 mg Intravenous Given 06/01/22 1735)    Mobility walks with person assist     Focused Assessments Neuro Assessment Handoff:  Swallow screen pass? Yes    NIH Stroke Scale  Dizziness Present: No Headache Present: No     Neuro Assessment: Within Defined Limits Neuro Checks:      Has TPA been given? No If patient is a Neuro Trauma and patient is going to OR before floor call report to [place] nurse: [phone] or [phone]   R Recommendations: See Admitting Provider Note  Report given to:   Additional Notes: New onset of abdominal distention and tenderness to light palpation, xray done to r/o ileus. PEG in place and flushes well.

## 2022-06-02 NOTE — Progress Notes (Signed)
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  She returned to the ER 1/20 with the acute onset of rhythmic right arm jerking.  Neurology was concerned this represented focal status epilepticus and the patient was admitted for continuous EEG monitoring.  While in the ER she received IV Ativan as well as IV Vimpat and Keppra.  Consultants:  Neurology  Goals of Care:  Code Status: Full Code   DVT prophylaxis: SCDs  Interim Hx: The patient has remained afebrile since presentation.  Blood pressure control has improved.  Her saturations are stable.  She has no complaints at the time of my visit.  She tells me she is anxious to go home soon as possible.  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 As evidenced by rhythmic right arm jerking -management per Neurology -continue IV Vimpat and Keppra -arm movement had ceased at the time of my visit  Laryngeal cancer status post XRT  Oropharyngeal dysphagia with G-tube dependence Due to above - complex hx of dysphagia and probable chronic aspiration complicated by patient's low healthcare literacy and insistence on eating - continue diet most recently suggested to be safest by SLP eval   Severe protein calorie malnutrition Diet resumed  History of gastric and duodenal ulcers Recent acute GI bleed requiring hospitalization 1/7-1/03/2023 -continue Protonix -hemoglobin stable at present  Hypothyroidism Continue Synthroid  HTN Blood pressure improved at this time -follow without change in treatment today  History of alcohol abuse EtOH level  undetectable at presentation -patient reports she had abstain from alcohol for at least 9 days at the time of her presentation  Disposition: Lives independently -anticipate discharge to same environment as prior to this admission   Objective: Blood pressure 128/87, pulse 97, temperature 98 F (36.7 C), temperature source Oral, resp. rate (!) 22, height '5\' 1"'$  (1.549 m), weight 44 kg, last menstrual period 10/20/2012, SpO2 97 %.  Intake/Output Summary (Last 24 hours) at 06/02/2022 0754 Last data filed at 06/02/2022 0422 Gross per 24 hour  Intake 324.86 ml  Output 650 ml  Net -325.14 ml   Filed Weights   06/01/22 1618  Weight: 44 kg    Examination: General: No acute respiratory distress Lungs: Clear to auscultation bilaterally without wheezes or crackles Cardiovascular: Regular rate and rhythm without murmur gallop or rub normal S1 and S2 Abdomen: Nontender, nondistended, soft, bowel sounds positive, no rebound, no ascites, no appreciable mass Extremities: No significant cyanosis, clubbing, or edema bilateral lower extremities  CBC: Recent Labs  Lab 05/26/22 1707 06/01/22 1631  WBC 4.4 9.6  NEUTROABS  --  8.4*  HGB 12.4 12.9  HCT 37.6 40.9  MCV 91.9 93.8  PLT 384 678*   Basic Metabolic Panel: Recent Labs  Lab 05/26/22 1707 06/01/22 1631  NA 133* 136  K 2.8* 4.2  CL 97* 103  CO2 25 25  GLUCOSE 71 81  BUN <5* 11  CREATININE 0.71 0.93  CALCIUM 8.6* 9.1   GFR: Estimated Creatinine Clearance: 51.4 mL/min (by C-G formula based on SCr of 0.93 mg/dL).   Scheduled Meds:  folic acid  1 mg Oral Daily   multivitamin with minerals  1 tablet  Oral Daily   mouth rinse  15 mL Mouth Rinse Q2H   thiamine  100 mg Oral Daily   Or   thiamine  100 mg Intravenous Daily   Continuous Infusions:  lacosamide (VIMPAT) IV     levETIRAcetam Stopped (06/02/22 8938)     LOS: 0 days   Cherene Altes, MD [facility] Office  [phone] Pager - Text Page per Shea Evans  If  7PM-7AM, please contact night-coverage per Amion 06/02/2022, 7:54 AM

## 2022-06-02 NOTE — ED Notes (Signed)
X-ray at bedside

## 2022-06-02 NOTE — ED Notes (Signed)
Provider at bedside.

## 2022-06-02 NOTE — ED Notes (Signed)
This RN assumed care of patient and received off going transfer of care report from off going RN. Pt presented to the ED with ongoing abnormal movement of right arm from home. Pt presents without seizure like activity at this time. PT is resting in gurney at this time, respirations are spontaneous, even, unlabored and symmetrical bilaterally. Pt skin tone is appropriate for ethnicity, dry and warm. Pt connected to CCM, pulse ox and BP.

## 2022-06-02 NOTE — ED Notes (Signed)
Neuro provider at bedside  

## 2022-06-02 NOTE — ED Notes (Signed)
Provided pt cup of ice.

## 2022-06-02 NOTE — ED Notes (Addendum)
Provider aware of new distension and abdominal pain. imaging and clear diet ordered to r/o an ileus.

## 2022-06-02 NOTE — ED Notes (Signed)
This RN was unable to reach EEG tech twice at phone number [phone].

## 2022-06-02 NOTE — Progress Notes (Signed)
Mi[place] pt to 3w29

## 2022-06-02 NOTE — ED Notes (Signed)
Transportation called to move patient to new room

## 2022-06-02 NOTE — Discharge Instructions (Signed)
              [facility]  Intensive Outpatient Programs  [place] [facility] [address]     [place] [address] [place],  [place], [place]      [place]  (Inpatient and outpatient)  [phone] (Suboxone and Methadone) [address]           [phone]           [facility]: Alcohol & Drug Services    [facility] - Intensive Outpatient [address]     [address] [place] [address] [place], [place] [address]     [place], [place]      [phone]  [facility] [place] (Outpatient, Inpatient, Chemical  [facility] (Groups and [facility]idental) (insurance only) [phone]    [place], [place]       [facility]    [facility] (for caregivers and family) [address]     [address] [place], [place], [place]      [phone]  Residential Treatment Programs  [place]  [place](2 years) Residential: 2 days)  [facility] ([place], [place], [place] [phone]      [phone] or [phone]  [facility] [place], [place]    [facility] [address]      [place], [place], [place]      [phone]  [place]   Residential Treatment Services (RTS) [place]     [address] [place], [place] [address]     [place], [place]      [phone] Admissions: 8am-3pm M-F  [facility]: Residential Program [phone] Days)              ADATC: [facility]  [place], [place], [place] or [phone]    (Walk in Hours over the weekend or by referral)   [place] Crisis: [facility]:[phone] (for crisis  response 24 hours a day)

## 2022-06-02 NOTE — TOC Initial Note (Signed)
Transition of Care [facility]) - Initial/Assessment Note    Patient Details  Name: Crystal Spence MRN: [id number] Date of Birth: 1976/01/03  [facility]) CM/SW Contact:    Verdell Carmine, RN Phone Number: 06/02/2022, 8:46 AM  Clinical Narrative:                 47 year old with poor health literacy presented with new onset of jerking right arm. History of THC positive 05/01/22, denies ETOH  drugs since being discharged. She currently lives in the [place], does have daughter in [place] who took her for a few days post discharge last admission. She now has a G-tube with feedings set up by [facility] last admission also.  TOC will follow for needs, recommendations, and transitions of care.    Barriers to Discharge: Continued Medical Work up   Patient Goals and CMS Choice    Home Self care ( hotel) or with daughter        Expected Discharge Plan and Services   Discharge Planning Services: CM Consult   Living arrangements for the past 2 months: Hotel/Motel                                      Prior Living Arrangements/Services Living arrangements for the past 2 months: Hotel/Motel Lives with:: Self Patient language and need for interpreter reviewed:: Yes        Need for Family Participation in Patient Care: Yes (Comment) Care giver support system in place?: Yes (comment) Current home services: DME (Has tube feedings) Criminal Activity/Legal Involvement Pertinent to Current Situation/Hospitalization: No - Comment as needed  Activities of Daily Living      Permission Sought/Granted                  Emotional Assessment Appearance:: Appears older than stated age     Orientation: : Fluctuating Orientation (Suspected and/or reported Sund[place]) Alcohol / Substance Use: Illicit Drugs Psych Involvement: No (comment)  Admission diagnosis:  Status epilepticus ([place]) [G40.901] Seizure ([place]) [R56.9] Patient Active Problem List   Diagnosis Date  Noted   Gastric ulcer 06/01/2022   Encephalopathy acute 05/20/2022   ABLA (acute blood loss anemia) 05/20/2022   Hemorrhagic shock ([place]) 05/19/2022   Status epilepticus ([place]) 05/01/2022   Acute respiratory failure with hypoxia ([place]) 05/01/2022   Influenza A 05/01/2022   Pyelonephritis 08/14/2021   Nausea and vomiting 08/14/2021   Hypokalemia 03/15/2019   Seizure-like activity ([place]) 05/03/2018   Hypertensive urgency 05/03/2018   Chronic headaches 05/03/2018   Oropharyngeal dysphagia 02/04/2018   Pressure injury of skin 01/18/2018   GI bleed 01/17/2018   Melena    Smoking 11/03/2017   Seizure ([place])    Essential hypertension 06/02/2017   Positive pregnancy test 06/02/2017   Bronchitis, acute    Observed seizure-like activity ([place]) 06/01/2017   Medication monitoring encounter 04/29/2016   Protein-calorie malnutrition, severe 03/19/2016   Chronic anemia    Thrombocytosis    Abscess of lower lobe of right lung with pneumonia ([place])    Hypoxemia    Poor dentition    Pulmonary abscess ([place]) 03/17/2016   Dehydration 03/17/2016   Abnormal LFTs 12/14/2015   Iron deficiency anemia 12/04/2015   Multiple lung nodules 06/28/2014   Other type of nonintractable migraine 06/28/2014   Leukopenia 06/28/2014   Rib pain 01/14/2014   Rib pain on left side 01/05/2014   Unexplained  weight loss 01/05/2014   Anemia in chronic illness 06/17/2013   Hypothyroidism (acquired) 06/17/2013   Poor oral hygiene 06/17/2013   Acute blood loss anemia 02/03/2013   Syncope 02/03/2013   Hyponatremia 02/03/2013   History of laryngeal cancer 02/03/2013   Hypokalemia due to excessive gastrointestinal loss of potassium 02/02/2013   Alcohol abuse 02/02/2013   Upper GI bleed 02/02/2013   PCP:  Trey Sailors, PA Pharmacy:   [facility] [place], [place] [address] [place] [address] Phone: [phone] Fax:  [phone]     Social Determinants of Health ([place]) Social History: [place]: No Food Insecurity (05/01/2022)  Housing: Low Risk  (05/01/2022)  Transportation Needs: No Transportation Needs (05/01/2022)  Utilities: Not At Risk (05/01/2022)  Financial Resource Strain: Medium Risk (01/17/2018)  Tobacco Use: Medium Risk (05/26/2022)   SDOH Interventions:     Readmission Risk Interventions    05/23/2022   11:29 AM  Readmission Risk Prevention Plan  Transportation Screening Complete  Medication Review (RN Care Manager) Complete  PCP or Specialist appointment within 3-5 days of discharge Complete  HRI or Home Care Consult Complete  SW Recovery Care/Counseling Consult Complete  Palliative Care Screening Complete  [place] Complete

## 2022-06-03 ENCOUNTER — Other Ambulatory Visit (HOSPITAL_COMMUNITY): Payer: Self-pay

## 2022-06-03 DIAGNOSIS — R569 Unspecified convulsions: Secondary | ICD-10-CM

## 2022-06-03 DIAGNOSIS — G40901 Epilepsy, unspecified, not intractable, with status epilepticus: Secondary | ICD-10-CM | POA: Diagnosis not present

## 2022-06-03 LAB — CBC
HCT: 36.3 % (ref 36.0–46.0)
Hemoglobin: 11 g/dL — ABNORMAL LOW (ref 12.0–15.0)
MCH: 28.7 pg (ref 26.0–34.0)
MCHC: 30.3 g/dL (ref 30.0–36.0)
MCV: 94.8 fL (ref 80.0–100.0)
Platelets: 429 10*3/uL — ABNORMAL HIGH (ref 150–400)
RBC: 3.83 MIL/uL — ABNORMAL LOW (ref 3.87–5.11)
RDW: 17.7 % — ABNORMAL HIGH (ref 11.5–15.5)
WBC: 3.9 10*3/uL — ABNORMAL LOW (ref 4.0–10.5)
nRBC: 0 % (ref 0.0–0.2)

## 2022-06-03 LAB — COMPREHENSIVE METABOLIC PANEL
ALT: 25 U/L (ref 0–44)
AST: 43 U/L — ABNORMAL HIGH (ref 15–41)
Albumin: 2.4 g/dL — ABNORMAL LOW (ref 3.5–5.0)
Alkaline Phosphatase: 96 U/L (ref 38–126)
Anion gap: 6 (ref 5–15)
BUN: 15 mg/dL (ref 6–20)
CO2: 26 mmol/L (ref 22–32)
Calcium: 8.6 mg/dL — ABNORMAL LOW (ref 8.9–10.3)
Chloride: 105 mmol/L (ref 98–111)
Creatinine, Ser: 0.93 mg/dL (ref 0.44–1.00)
GFR, Estimated: >60 mL/min (ref >60)
Glucose, Bld: 92 mg/dL (ref 70–99)
Potassium: 4 mmol/L (ref 3.5–5.1)
Sodium: 137 mmol/L (ref 135–145)
Total Bilirubin: 0.4 mg/dL (ref 0.3–1.2)
Total Protein: 5.3 g/dL — ABNORMAL LOW (ref 6.5–8.1)

## 2022-06-03 LAB — MAGNESIUM: Magnesium: 1.9 mg/dL (ref 1.7–2.4)

## 2022-06-03 LAB — LEVETIRACETAM LEVEL: Levetiracetam Lvl: 22.5 ug/mL (ref 10.0–40.0)

## 2022-06-03 MED ORDER — LACOSAMIDE 100 MG PO TABS
100.0000 mg | ORAL_TABLET | Freq: Two times a day (BID) | ORAL | 2 refills | Status: AC
  Filled 2022-06-03: qty 60, 30d supply, fill #0

## 2022-06-03 MED ORDER — LEVETIRACETAM 500 MG PO TABS
1500.0000 mg | ORAL_TABLET | Freq: Two times a day (BID) | ORAL | 2 refills | Status: AC
  Filled 2022-06-03: qty 180, 30d supply, fill #0

## 2022-06-03 MED ORDER — ACETAMINOPHEN 500 MG PO TABS: 500.0000 mg | ORAL_TABLET | Freq: Four times a day (QID) | ORAL | 0 refills | Status: AC | PRN

## 2022-06-03 NOTE — Progress Notes (Addendum)
Subjective: No complaints. States her right arm twitching is what brought her in. She states this has happened before, but "never this bad".  Objective: Current vital signs: BP 122/80 (BP Location: Right Arm)   Pulse 81   Temp 98.7 F (37.1 C) (Oral)   Resp 14   Ht '5\' 1"'$  (1.549 m)   Wt 42.8 kg   LMP 10/20/2012 Comment: pt. has had tubal ligation,no longer has peroids  SpO2 99%   BMI 17.83 kg/m  Vital signs in last 24 hours: Temp:  [97.8 F (36.6 C)-98.7 F (37.1 C)] 98.7 F (37.1 C) (01/22 0808) Pulse Rate:  [74-98] 81 (01/22 0808) Resp:  [14-25] 14 (01/22 0808) BP: (111-166)/(80-121) 122/80 (01/22 0808) SpO2:  [96 %-100 %] 99 % (01/22 0808) Weight:  [42.8 kg] 42.8 kg (01/22 0342)  Intake/Output from previous day: 01/21 0701 - 01/22 0700 In: 338.8 [I.V.:120.9; IV Piggyback:217.9] Out: 1000 [Urine:1000] Intake/Output this shift: No intake/output data recorded. Nutritional status:  Diet Order             Diet clear liquid Room service appropriate? Yes; Fluid consistency: Thin  Diet effective now                  HEENT: EEG leads in place Lungs: Respirations unlabored  Neuro: Mental Status: Awake, alert, oriented to self, situation, the hospital, city, state, month and day of the week, but could not remember the year. She follows all commands without requiring repeat instruction today (improved). Speech is clear. No evidence for expressive or receptive aphasia.  Cranial Nerves: Fixates and tracks normally. No facial droop. Hearing is intact to voice. Tongue is midline. Motor: 5/5 x 4 without asymmetry. No jerking or twitching noted in any limb. No pronator drift.  Sensory: Intact and symmetric to light touch throughout Cerebellar: No ataxia with FNF bilaterally.  Gait: Deferred  Lab Results: Results for orders placed or performed during the hospital encounter of 06/01/22 (from the past 48 hour(s))  CBC with Differential     Status: Abnormal   Collection Time:  06/01/22  4:31 PM  Result Value Ref Range   WBC 9.6 4.0 - 10.5 K/uL   RBC 4.36 3.87 - 5.11 MIL/uL   Hemoglobin 12.9 12.0 - 15.0 g/dL   HCT 40.9 36.0 - 46.0 %   MCV 93.8 80.0 - 100.0 fL   MCH 29.6 26.0 - 34.0 pg   MCHC 31.5 30.0 - 36.0 g/dL   RDW 17.4 (H) 11.5 - 15.5 %   Platelets 498 (H) 150 - 400 K/uL   nRBC 0.0 0.0 - 0.2 %   Neutrophils Relative % 88 %   Neutro Abs 8.4 (H) 1.7 - 7.7 K/uL   Lymphocytes Relative 5 %   Lymphs Abs 0.5 (L) 0.7 - 4.0 K/uL   Monocytes Relative 4 %   Monocytes Absolute 0.4 0.1 - 1.0 K/uL   Eosinophils Relative 2 %   Eosinophils Absolute 0.2 0.0 - 0.5 K/uL   Basophils Relative 1 %   Basophils Absolute 0.1 0.0 - 0.1 K/uL   Immature Granulocytes 0 %   Abs Immature Granulocytes 0.02 0.00 - 0.07 K/uL    Comment: Performed at [facility], [address], [place], [place] [address]  Ethanol     Status: None   Collection Time: 06/01/22  4:31 PM  Result Value Ref Range   Alcohol, Ethyl (B) <10 <10 mg/dL    Comment: (NOTE) Lowest detectable limit for serum alcohol is 10 mg/dL.  For medical purposes only. Performed at [facility], [place] [address], [place], [place] [address]   Comprehensive metabolic panel     Status: Abnormal   Collection Time: 06/01/22  4:31 PM  Result Value Ref Range   Sodium 136 135 - 145 mmol/L   Potassium 4.2 3.5 - 5.1 mmol/L   Chloride 103 98 - 111 mmol/L   CO2 25 22 - 32 mmol/L   Glucose, Bld 81 70 - 99 mg/dL    Comment: Glucose reference range applies only to samples taken after fasting for at least 8 hours.   BUN 11 6 - 20 mg/dL   Creatinine, Ser 0.93 0.44 - 1.00 mg/dL   Calcium 9.1 8.9 - 10.3 mg/dL   Total Protein 6.2 (L) 6.5 - 8.1 g/dL   Albumin 2.9 (L) 3.5 - 5.0 g/dL   AST 46 (H) 15 - 41 U/L   ALT 21 0 - 44 U/L   Alkaline Phosphatase 89 38 - 126 U/L   Total Bilirubin 0.4 0.3 - 1.2 mg/dL   GFR, Estimated >60 >60 mL/min    Comment: (NOTE) Calculated using the CKD-EPI Creatinine Equation (2021)    Anion  gap 8 5 - 15    Comment: Performed at [facility], [place] [address] [place], [place] [address]  Comprehensive metabolic panel     Status: Abnormal   Collection Time: 06/03/22  3:37 AM  Result Value Ref Range   Sodium 137 135 - 145 mmol/L   Potassium 4.0 3.5 - 5.1 mmol/L   Chloride 105 98 - 111 mmol/L   CO2 26 22 - 32 mmol/L   Glucose, Bld 92 70 - 99 mg/dL    Comment: Glucose reference range applies only to samples taken after fasting for at least 8 hours.   BUN 15 6 - 20 mg/dL   Creatinine, Ser 0.93 0.44 - 1.00 mg/dL   Calcium 8.6 (L) 8.9 - 10.3 mg/dL   Total Protein 5.3 (L) 6.5 - 8.1 g/dL   Albumin 2.4 (L) 3.5 - 5.0 g/dL   AST 43 (H) 15 - 41 U/L   ALT 25 0 - 44 U/L   Alkaline Phosphatase 96 38 - 126 U/L   Total Bilirubin 0.4 0.3 - 1.2 mg/dL   GFR, Estimated >60 >60 mL/min    Comment: (NOTE) Calculated using the CKD-EPI Creatinine Equation (2021)    Anion gap 6 5 - 15    Comment: Performed at [facility], [place] [address], [place], [place] [address]  Magnesium     Status: None   Collection Time: 06/03/22  3:37 AM  Result Value Ref Range   Magnesium 1.9 1.7 - 2.4 mg/dL    Comment: Performed at [facility] [address], [place],  [address]  CBC     Status: Abnormal   Collection Time: 06/03/22  3:37 AM  Result Value Ref Range   WBC 3.9 (L) 4.0 - 10.5 K/uL   RBC 3.83 (L) 3.87 - 5.11 MIL/uL   Hemoglobin 11.0 (L) 12.0 - 15.0 g/dL   HCT 36.3 36.0 - 46.0 %   MCV 94.8 80.0 - 100.0 fL   MCH 28.7 26.0 - 34.0 pg   MCHC 30.3 30.0 - 36.0 g/dL   RDW 17.7 (H) 11.5 - 15.5 %   Platelets 429 (H) 150 - 400 K/uL   nRBC 0.0 0.0 - 0.2 %    Comment: Performed at [facility], [place] [address],  [place], [place] [address]    No results found for this or any previous visit (from the past 240 hour(s)).  Lipid Panel No results for input(s): "CHOL", "TRIG", "HDL", "CHOLHDL", "VLDL", "LDLCALC" in the last 72 hours.  Studies/Results: DG Abd Portable 1V  Result  Date: 06/02/2022 CLINICAL DATA:  Ileus EXAM: PORTABLE ABDOMEN - 1 VIEW COMPARISON:  Portable exam 1820 hours compared to 05/08/2022 FINDINGS: Gastrostomy tube projects over stomach, which is distended. Prominent stool RIGHT colon. Bowel gas pattern otherwise normal. No bowel dilatation or bowel wall thickening. Multiple calcifications in the lower lungs bilaterally. IMPRESSION: Gaseous distention of stomach. Nonobstructive bowel gas pattern. Old granulomatous disease. Electronically Signed   By: Lavonia Dana M.D.   On: 06/02/2022 19:06   Overnight EEG with video  Result Date: 06/02/2022 Lora Havens, MD     06/03/2022  8:37 AM Patient Name: Crystal Spence MRN: [id number] Epilepsy Attending: Lora Havens Referring Physician/Provider: Derek Jack, MD  Duration: 06/01/2022 1834 to 06/02/2022 0277, 06/02/2021 2020 to 06/03/2021 0800  Patient history: 47 year old female being evaluated for nonconvulsive status epilepticus.  Level of alertness: Awake, asleep  AEDs during EEG study: LEV, LCM  Technical aspects: This EEG study was done with scalp electrodes positioned according to the 10-20 International system of electrode placement. Electrical activity was reviewed with band pass filter of 1-'70Hz'$ , sensitivity of 7 uV/mm, display speed of 60m/sec with a '60Hz'$  notched filter applied as appropriate. EEG data were recorded continuously and digitally stored.  Video monitoring was available and reviewed as appropriate.  Description: The posterior dominant rhythm consists of 9-10 Hz activity of moderate voltage (25-35 uV) seen predominantly in posterior head regions, symmetric and reactive to eye opening and eye closing. Sleep was characterized by vertex waves, sleep spindles (12 to 14 Hz), maximal frontocentral region. EEG showed continuous 3 to 5 Hz theta-delta slowing in left fronto-parietal region admixed with 13-'15hz'$  beta activity. Sharp waves were noted in left fronto-parietal region which at times appeared  rhythmic and quasi-periodic at 0.5 to 2 Hz.  Hyperventilation and photic stimulation were not performed.   EEG was disconnected between  06/02/2022 0753 to 2020 for unclear reasons.  ABNORMALITY - Sharp waves, left fronto-parietal region - Continuous slow, left fronto-parietal region  IMPRESSION: This study showed evidence of epileptogenicity arising from left fronto-parietal region which is on the ictal-interictal continuum and high risk for seizures. Additionally there is cortical dysfunction arising from left fronto-parietal region likely due to underlying structural abnormality, post-ictal state. Dr SQuinn Axewas notified  PLora Havens   Medications: Prior to Admission:  Medications Prior to Admission  Medication Sig Dispense Refill Last Dose   acetaminophen (TYLENOL) 500 MG tablet Take 500 mg by mouth 3 (three) times daily as needed for mild pain or headache.   06/01/2022   levETIRAcetam (KEPPRA) 500 MG tablet Take 500 mg by mouth 2 (two) times daily.   06/01/2022   pantoprazole sodium (PROTONIX) 40 mg Place 40 mg into feeding tube 2 (two) times daily. (Patient taking differently: Take 40 mg by mouth 2 (two) times daily.) 60 packet 1 05/31/2022   Water For Irrigation, Sterile (FREE WATER) SOLN Place 100 mLs into feeding tube every 6 (six) hours. (Patient taking differently: Place 50 mLs into feeding tube in the morning and at bedtime.)   Past Week   diltiazem (CARDIZEM) 30 MG tablet Place 1 tablet (30 mg total) into feeding tube 2 (two) times daily. (Patient not taking: Reported on 06/02/2022) 90 tablet  11 Not Taking at am   folic acid (FOLVITE) 1 MG tablet Place 1 tablet (1 mg total) into feeding tube daily. (Patient not taking: Reported on 06/02/2022) 30 tablet 0 Not Taking   lacosamide 100 MG TABS Place 1 tablet (100 mg total) into feeding tube 2 (two) times daily. (Patient not taking: Reported on 06/02/2022) 60 tablet 0 Not Taking   levETIRAcetam (KEPPRA) 1000 MG tablet Place 1 tablet (1,000 mg total)  into feeding tube 2 (two) times daily. (Patient not taking: Reported on 06/02/2022)   Not Taking   levothyroxine (SYNTHROID) 75 MCG tablet Place 1 tablet (75 mcg total) into feeding tube daily before breakfast. (Patient not taking: Reported on 06/02/2022)   Not Taking   Multiple Vitamin (MULTIVITAMIN WITH MINERALS) TABS tablet Place 1 tablet into feeding tube daily. (Patient not taking: Reported on 06/02/2022) 30 tablet 0 Not Taking   Nutritional Supplements (FEEDING SUPPLEMENT, OSMOLITE 1.2 CAL,) LIQD Place 355 mLs into feeding tube 4 (four) times daily - after meals and at bedtime. (Patient not taking: Reported on 06/02/2022)  0 Not Taking   polyethylene glycol (MIRALAX / GLYCOLAX) 17 g packet Place 17 g into feeding tube daily as needed. (Patient not taking: Reported on 06/02/2022) 14 each 0 Not Taking   senna-docusate (SENOKOT-S) 8.6-50 MG tablet Place 2 tablets into feeding tube at bedtime. (Patient not taking: Reported on 06/02/2022)   Not Taking   thiamine (VITAMIN B-1) 100 MG tablet Place 1 tablet (100 mg total) into feeding tube daily. (Patient not taking: Reported on 06/02/2022) 30 tablet 0 Not Taking   traZODone (DESYREL) 100 MG tablet Take 100 mg by mouth at bedtime as needed for sleep. (Patient not taking: Reported on 06/02/2022)   Not Taking   Scheduled:  diltiazem  30 mg Per Tube BID   famotidine  20 mg Per Tube BID   folic acid  1 mg Per Tube Daily   levothyroxine  75 mcg Per Tube Q0600   multivitamin with minerals  1 tablet Per Tube Daily   senna-docusate  2 tablet Per Tube QHS   simethicone  80 mg Per Tube QID   thiamine  100 mg Per Tube Daily   Continuous:  dextrose 5 % and 0.9% NaCl 60 mL/hr at 06/02/22 2100   lacosamide (VIMPAT) IV 100 mg (06/02/22 2227)   levETIRAcetam 1,500 mg (06/03/22 0630)   Assessment: 47 year old female with a PMHx of seizures (medication nonadherence versus alcohol use versus in the setting of acute illness) who presented to the ED on 06/01/2022 with right  arm rhythmic jerking every 2-3 minutes lasting approximately 30 seconds each, concerning for focal status epilepticus. Patient retains consciousness during right arm twitching.  Patient endorses compliance with her AED medications though she is unable to identify what she takes for seizures when asked.  Denies alcohol use since admission for seizures in December 2023.  Presentation was concerning for focal status epilepticus.  Overnight EEG Sat-Sun showed evidence of epileptogenicity arising from the left frontoparietal region which was on the ictal-interictal continuum with high risk for recurrent seizures.  Also seen on EEG was cortical dysfunction in the left frontoparietal region likely due to underlying structural abnormality versus postictal state.  - Exam today is unchanged since yesterday. She is alert and fully oriented without clinical seizure activity seen during bedside exam.  - LTM EEG report for today AM (Monday): Sharp waves, left fronto-parietal region; continuous slow, left fronto-parietal region. This study shows evidence of epileptogenicity and cortical dysfunction  arising from left fronto-parietal region likely due to underlying structural abnormality, postictal state.  No definite seizures were seen during the study. EEG appears to be improving compared to previous day. - Overall impression: Focal status epilepticus, now resolved. In the setting of breakthrough seizures. Of unclear etiology, possibly medication error or poor medication compliance. She denies recent alcohol use   Recommendations: -Serum Vimpat and Keppra levels pending. May take 48-72 hours for Keppra level to result.  -Discontinuing LTM EEG -Continue Keppra 1500 mg twice daily and Vimpat 100 mg twice daily -Continue seizure precautions -1 mg IV Ativan STAT q65mn as needed for seizures lasting > 3 minutes and notify neurology - Has PEG tube. Requesting PO diet. Recommend Speech therapy to perform formal swallow  evaluation.     LOS: 1 day   '@Electronically'$  signed: Dr. EKerney Elbe1/22/2024  8:39 AM

## 2022-06-03 NOTE — Plan of Care (Signed)

## 2022-06-03 NOTE — Progress Notes (Signed)
DC completed./ No skin breakdown at all skin sites. [facility] notified./

## 2022-06-03 NOTE — Progress Notes (Signed)
Patient discharging to home.  She stated, "I want to go home".  She said that her daughter is going to pick her up and take her to a 3:00 PM appointment today.  She said that her aunt will be helping her at home.  She is alert and oriented.

## 2022-06-03 NOTE — TOC Transition Note (Signed)
Transition of Care [facility]) - CM/SW Discharge Note   Patient Details  Name: Crystal Spence MRN: [id number] Date of Birth: Aug 24, 1975  [facility]) CM/SW Contact:  Pollie Friar, RN Phone Number: 06/03/2022, 1:23 PM   Clinical Narrative:    Pt is discharging home with self care. She was to receive tube feedings from [facility] after last admission but they have been unable to locate her according to [facility] with [employer]. She went from here to her daughters and then to the motel. They attempted to find her at the motel without success. CM has updated her about the delivery she will receive tonight and to stay near her room. Pt in agreement.  Pt states her daughter will provide needed transport home.  Pt states at room 128 at the [facility].     Final next level of care: Home/Self Care Barriers to Discharge: No Barriers Identified   Patient Goals and CMS Choice      Discharge Placement                         Discharge Plan and Services Additional resources added to the After Visit Summary for     Discharge Planning Services: CM Consult                                 Social Determinants of Health (SDOH) Interventions SDOH Screenings   Food Insecurity: No Food Insecurity (06/02/2022)  Housing: Low Risk  (06/02/2022)  Transportation Needs: No Transportation Needs (06/02/2022)  Utilities: Not At Risk (06/02/2022)  Financial Resource Strain: Medium Risk (01/17/2018)  Tobacco Use: Medium Risk (05/26/2022)     Readmission Risk Interventions    05/23/2022   11:29 AM  Readmission Risk Prevention Plan  Transportation Screening Complete  Medication Review (RN Care Manager) Complete  PCP or Specialist appointment within 3-5 days of discharge Complete  HRI or [facility] Complete  SW Recovery Care/Counseling Consult Complete  Palliative Care Screening Complete  Skilled Nursing Facility Complete

## 2022-06-03 NOTE — Discharge Summary (Signed)
DISCHARGE SUMMARY  Crystal Spence  MR#: [id number]  DOB:[birth year]  Date of Admission: 06/01/2022 Date of Discharge: 06/03/2022  Attending Physician:Williette Loewe Hennie Duos, MD  Patient's QMG:[id number], [name], PA  Consults: Neurology  Disposition: Discharge home  Follow-up Appts:  Follow-up Information     Trey Sailors, PA Follow up.   Specialty: Physician Assistant Contact information: [address] [place] [address] [phone]         [facility] Follow up.   Why: This is the company for your tube feedings. Please call if any issues. 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 She returned to the ER 1/20 with the acute onset of rhythmic right arm jerking. Neurology was concerned this represented focal status epilepticus and the patient was admitted for continuous EEG monitoring. While in the ER she received IV Ativan as well as IV Vimpat and Keppra.   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 As evidenced by rhythmic right arm jerking -management per Neurology -continue Vimpat and Keppra (at increased dose) - no active seizure activity noted on continuous EEG overnight prior to d/c     Laryngeal cancer status post XRT   Oropharyngeal dysphagia with G-tube dependence Due to above - complex hx of dysphagia and probable chronic aspiration complicated by patient's low healthcare literacy and insistence on eating - continue diet most recently suggested to be safest by SLP eval - tolerating Dypshagia 3 diet w/ thin liquids at time of d/c    Severe protein calorie malnutrition Diet resumed - continue tube feeds as well    History of gastric and duodenal ulcers Recent acute GI bleed requiring hospitalization 1/7-1/03/2023 -continue Protonix -hemoglobin stable during this admission    Hypothyroidism Continue Synthroid   HTN Blood pressure well-controlled at present   History of alcohol abuse EtOH level undetectable at presentation -patient reports she has abstained from alcohol for at least 9 days at the time of her presentation  Allergies as of 06/03/2022   No Known Allergies      Medication List     STOP taking these medications    polyethylene glycol 17 g packet Commonly known as: MIRALAX / GLYCOLAX   traZODone 100 MG tablet Commonly known as: DESYREL       TAKE these medications    acetaminophen 500 MG tablet Commonly known as: TYLENOL Place 1 tablet (500 mg total) into feeding tube every 6 (six) hours as needed for mild pain or headache. What changed:  how to take this when to take this   diltiazem 30 MG tablet Commonly known as: Cardizem Place 1 tablet (30 mg total) into feeding tube 2 (two) times daily.   feeding supplement (OSMOLITE 1.2 CAL) Liqd Place 355 mLs into feeding tube 4 (four) times daily - after meals and at bedtime.   folic acid 1 MG tablet Commonly known as:  FOLVITE Place 1 tablet (1 mg total) into feeding tube daily.   free water Soln Place 100 mLs into feeding tube every 6 (six) hours. What changed:  how much to take when to take this   Lacosamide 100 MG Tabs Commonly known as: Vimpat Place 1 tablet (100 mg total)  into feeding tube in the morning and at bedtime. What changed: when to take this   levETIRAcetam 500 MG tablet Commonly known as: Keppra Place 3 tablets (1,500 mg total) into feeding tube 2 (two) times daily. What changed:  medication strength how much to take   levothyroxine 75 MCG tablet Commonly known as: SYNTHROID Place 1 tablet (75 mcg total) into feeding tube daily before breakfast.   multivitamin with minerals Tabs tablet Place 1 tablet into feeding tube daily.   pantoprazole sodium 40 mg Commonly known as: PROTONIX Place 40 mg into feeding tube 2 (two) times daily. What changed: how to take this   senna-docusate 8.6-50 MG tablet Commonly known as: Senokot-S Place 2 tablets into feeding tube at bedtime.   thiamine 100 MG tablet Commonly known as: Vitamin B-1 Place 1 tablet (100 mg total) into feeding tube daily.               Durable Medical Equipment  (From admission, onward)           Start     Ordered   06/03/22 1320  For home use only DME Tube feeding  Once       Comments: feeding supplement (OSMOLITE 1.2 CAL) Liqd Place 355 mLs into feeding tube 4 (four) times daily - after meals and at bedtime.  free water Soln Place 100 mLs into feeding tube every 6 (six) hours.   06/03/22 1320            Day of Discharge BP 130/83 (BP Location: Right Arm)   Pulse 89   Temp 98.6 F (37 C) (Oral)   Resp 14   Ht '5\' 1"'$  (1.549 m)   Wt 42.8 kg   LMP 10/20/2012 Comment: pt. has had tubal ligation,no longer has peroids  SpO2 100%   BMI 17.83 kg/m   Physical Exam: General: No acute respiratory distress Lungs: Clear to auscultation bilaterally without wheezes or crackles Cardiovascular: Regular rate and rhythm without murmur gallop or rub normal S1 and S2 Abdomen: Nontender, nondistended, soft, bowel sounds positive, no rebound, no ascites, no appreciable mass Extremities: No significant cyanosis, clubbing, or edema bilateral lower  extremities  Basic Metabolic Panel: Recent Labs  Lab 06/01/22 1631 06/03/22 0337  NA 136 137  K 4.2 4.0  CL 103 105  CO2 25 26  GLUCOSE 81 92  BUN 11 15  CREATININE 0.93 0.93  CALCIUM 9.1 8.6*  MG  --  1.9   CBC: Recent Labs  Lab 06/01/22 1631 06/03/22 0337  WBC 9.6 3.9*  NEUTROABS 8.4*  --   HGB 12.9 11.0*  HCT 40.9 36.3  MCV 93.8 94.8  PLT 498* 429*   Time spent in discharge (includes decision making & examination of pt): 35 minutes  06/03/2022, 3:39 PM   Cherene Altes, MD [facility] Office  [phone]

## 2022-06-03 NOTE — Progress Notes (Signed)
LTM maint complete - no skin breakdown under:  f7. Pt computer offline placed all records on server. Test event button

## 2022-06-03 NOTE — Procedures (Addendum)
Patient Name: Crystal Spence  MRN: [id number]  Epilepsy Attending: Lora Havens  Referring Physician/Provider: Derek Jack, MD   Duration: 06/03/2021 0720 to 0935   Patient history: 47 year old female being evaluated for nonconvulsive status epilepticus.   Level of alertness: Awake, asleep   AEDs during EEG study: LEV, LCM   Technical aspects: This EEG study was done with scalp electrodes positioned according to the 10-20 International system of electrode placement. Electrical activity was reviewed with band pass filter of 1-'70Hz'$ , sensitivity of 7 uV/mm, display speed of 12m/sec with a '60Hz'$  notched filter applied as appropriate. EEG data were recorded continuously and digitally stored.  Video monitoring was available and reviewed as appropriate.   Description: The posterior dominant rhythm consists of 9-10 Hz activity of moderate voltage (25-35 uV) seen predominantly in posterior head regions, symmetric and reactive to eye opening and eye closing. Sleep was characterized by vertex waves, sleep spindles (12 to 14 Hz), maximal frontocentral region. EEG showed continuous 3 to 5 Hz theta-delta slowing in left fronto-parietal region admixed with 13-'15hz'$  beta activity. Sharp waves were noted in left fronto-parietal region. Hyperventilation and photic stimulation were not performed.      ABNORMALITY - Sharp waves, left fronto-parietal region - Continuous slow, left fronto-parietal region   IMPRESSION: This study showed evidence of epileptogenicity and cortical dysfunction arising from left fronto-parietal region likely due to underlying structural abnormality, postictal state.  No definite seizures were seen during the study.  EEG appears to be improving compared to previous day.   [name]

## 2022-06-03 NOTE — TOC CAGE-AID Note (Signed)
Transition of Care [facility]) - CAGE-AID Screening   Patient Details  Name: Crystal Spence MRN: [id number] Date of Birth: 09/14/1975  [facility] CM/SW Contact:    Pollie Friar, RN Phone Number: 06/03/2022, 1:31 PM   Clinical Narrative: Pt states she has not been using THC since last admission.   CAGE-AID Screening:    Have You Ever Felt You Ought to Cut Down on Your Drinking or Drug Use?: No Have People Annoyed You By Critizing Your Drinking Or Drug Use?: No Have You Felt Bad Or Guilty About Your Drinking Or Drug Use?: No Have You Ever Had a Drink or Used Drugs First Thing In The Morning to Steady Your Nerves or to Get Rid of a Hangover?: No CAGE-AID Score: 0  Substance Abuse Education Offered: Yes (refused)

## 2022-06-05 LAB — LACOSAMIDE: Lacosamide: <0.5 ug/mL — ABNORMAL LOW (ref 5.0–10.0)
